# Patient Record
Sex: Female | Born: 1940 | Race: White | Hispanic: No | Smoking: Former smoker
Health system: Southern US, Community
[De-identification: ages and names within clinical notes are randomized; demographics above are authoritative.]

## PROBLEM LIST (undated history)

## (undated) DIAGNOSIS — J449 Chronic obstructive pulmonary disease, unspecified: Secondary | ICD-10-CM

## (undated) DIAGNOSIS — I2510 Atherosclerotic heart disease of native coronary artery without angina pectoris: Secondary | ICD-10-CM

## (undated) DIAGNOSIS — M1990 Unspecified osteoarthritis, unspecified site: Secondary | ICD-10-CM

## (undated) DIAGNOSIS — I739 Peripheral vascular disease, unspecified: Secondary | ICD-10-CM

## (undated) DIAGNOSIS — E119 Type 2 diabetes mellitus without complications: Secondary | ICD-10-CM

## (undated) DIAGNOSIS — F329 Major depressive disorder, single episode, unspecified: Secondary | ICD-10-CM

## (undated) DIAGNOSIS — F419 Anxiety disorder, unspecified: Secondary | ICD-10-CM

## (undated) DIAGNOSIS — I10 Essential (primary) hypertension: Secondary | ICD-10-CM

## (undated) DIAGNOSIS — K219 Gastro-esophageal reflux disease without esophagitis: Secondary | ICD-10-CM

## (undated) DIAGNOSIS — F32A Depression, unspecified: Secondary | ICD-10-CM

## (undated) DIAGNOSIS — E7800 Pure hypercholesterolemia, unspecified: Secondary | ICD-10-CM

## (undated) DIAGNOSIS — I509 Heart failure, unspecified: Secondary | ICD-10-CM

## (undated) HISTORY — PX: CORONARY ANGIOPLASTY: SHX604

## (undated) HISTORY — PX: EYE SURGERY: SHX253

## (undated) HISTORY — DX: Peripheral vascular disease, unspecified: I73.9

## (undated) HISTORY — DX: Essential (primary) hypertension: I10

## (undated) HISTORY — PX: BREAST SURGERY: SHX581

## (undated) HISTORY — DX: Heart failure, unspecified: I50.9

## (undated) HISTORY — PX: CHOLECYSTECTOMY OPEN: SUR202

## (undated) HISTORY — PX: TUBAL LIGATION: SHX77

## (undated) HISTORY — PX: DILATION AND CURETTAGE OF UTERUS: SHX78

## (undated) HISTORY — PX: BACK SURGERY: SHX140

## (undated) HISTORY — DX: Pure hypercholesterolemia, unspecified: E78.00

## (undated) HISTORY — PX: CATARACT EXTRACTION, BILATERAL: SHX1313

---

## 1999-10-05 HISTORY — PX: ANTERIOR CERVICAL DECOMP/DISCECTOMY FUSION: SHX1161

## 1999-11-24 ENCOUNTER — Encounter: Payer: Self-pay | Admitting: Emergency Medicine

## 1999-11-25 ENCOUNTER — Inpatient Hospital Stay (HOSPITAL_COMMUNITY): Admission: EM | Admit: 1999-11-25 | Discharge: 1999-11-27 | Payer: Self-pay | Admitting: Emergency Medicine

## 2000-04-03 HISTORY — PX: PERIPHERAL VASCULAR INTERVENTION: CATH118257

## 2000-04-13 ENCOUNTER — Encounter: Payer: Self-pay | Admitting: *Deleted

## 2000-04-13 ENCOUNTER — Observation Stay (HOSPITAL_COMMUNITY): Admission: RE | Admit: 2000-04-13 | Discharge: 2000-04-14 | Payer: Self-pay | Admitting: *Deleted

## 2000-10-31 ENCOUNTER — Encounter: Payer: Self-pay | Admitting: Emergency Medicine

## 2000-10-31 ENCOUNTER — Inpatient Hospital Stay (HOSPITAL_COMMUNITY): Admission: EM | Admit: 2000-10-31 | Discharge: 2000-11-04 | Payer: Self-pay

## 2000-10-31 ENCOUNTER — Encounter: Payer: Self-pay | Admitting: General Surgery

## 2000-11-01 ENCOUNTER — Encounter: Payer: Self-pay | Admitting: General Surgery

## 2000-11-25 ENCOUNTER — Ambulatory Visit (HOSPITAL_COMMUNITY): Admission: RE | Admit: 2000-11-25 | Discharge: 2000-11-25 | Payer: Self-pay

## 2000-12-06 ENCOUNTER — Encounter: Admission: RE | Admit: 2000-12-06 | Discharge: 2000-12-06 | Payer: Self-pay | Admitting: Neurosurgery

## 2001-02-03 ENCOUNTER — Ambulatory Visit (HOSPITAL_COMMUNITY): Admission: RE | Admit: 2001-02-03 | Discharge: 2001-02-03 | Payer: Self-pay | Admitting: Neurosurgery

## 2001-02-22 ENCOUNTER — Ambulatory Visit (HOSPITAL_COMMUNITY): Admission: RE | Admit: 2001-02-22 | Discharge: 2001-02-23 | Payer: Self-pay | Admitting: Neurosurgery

## 2001-03-24 ENCOUNTER — Encounter: Admission: RE | Admit: 2001-03-24 | Discharge: 2001-03-24 | Payer: Self-pay | Admitting: Neurosurgery

## 2001-06-02 ENCOUNTER — Encounter: Admission: RE | Admit: 2001-06-02 | Discharge: 2001-06-02 | Payer: Self-pay | Admitting: Neurosurgery

## 2001-06-19 ENCOUNTER — Encounter: Admission: RE | Admit: 2001-06-19 | Discharge: 2001-07-11 | Payer: Self-pay | Admitting: Neurosurgery

## 2001-06-20 ENCOUNTER — Encounter: Admission: RE | Admit: 2001-06-20 | Discharge: 2001-06-20 | Payer: Self-pay | Admitting: Neurosurgery

## 2006-03-02 ENCOUNTER — Ambulatory Visit (HOSPITAL_COMMUNITY): Admission: RE | Admit: 2006-03-02 | Discharge: 2006-03-02 | Payer: Self-pay | Admitting: Family Medicine

## 2006-03-02 ENCOUNTER — Encounter (INDEPENDENT_AMBULATORY_CARE_PROVIDER_SITE_OTHER): Payer: Self-pay | Admitting: Specialist

## 2007-03-06 ENCOUNTER — Ambulatory Visit: Payer: Self-pay | Admitting: Physician Assistant

## 2007-03-06 ENCOUNTER — Encounter: Payer: Self-pay | Admitting: Cardiology

## 2007-03-17 ENCOUNTER — Ambulatory Visit: Payer: Self-pay | Admitting: Cardiology

## 2007-03-21 ENCOUNTER — Ambulatory Visit: Payer: Self-pay | Admitting: Internal Medicine

## 2007-04-10 ENCOUNTER — Ambulatory Visit: Payer: Self-pay | Admitting: Internal Medicine

## 2007-04-10 ENCOUNTER — Ambulatory Visit (HOSPITAL_COMMUNITY): Admission: RE | Admit: 2007-04-10 | Discharge: 2007-04-10 | Payer: Self-pay | Admitting: Internal Medicine

## 2007-04-10 ENCOUNTER — Encounter: Payer: Self-pay | Admitting: Internal Medicine

## 2007-04-18 ENCOUNTER — Ambulatory Visit: Payer: Self-pay | Admitting: Cardiology

## 2007-05-01 ENCOUNTER — Ambulatory Visit: Payer: Self-pay | Admitting: Cardiology

## 2007-07-26 ENCOUNTER — Encounter: Payer: Self-pay | Admitting: Cardiology

## 2007-10-25 ENCOUNTER — Encounter: Payer: Self-pay | Admitting: Cardiology

## 2007-11-08 ENCOUNTER — Ambulatory Visit: Payer: Self-pay | Admitting: Cardiology

## 2007-11-10 ENCOUNTER — Encounter: Payer: Self-pay | Admitting: Cardiology

## 2007-11-16 ENCOUNTER — Ambulatory Visit: Payer: Self-pay | Admitting: Cardiology

## 2008-01-22 ENCOUNTER — Encounter: Payer: Self-pay | Admitting: Cardiology

## 2008-04-22 ENCOUNTER — Encounter: Payer: Self-pay | Admitting: Cardiology

## 2008-07-29 ENCOUNTER — Encounter: Payer: Self-pay | Admitting: Cardiology

## 2008-08-16 ENCOUNTER — Ambulatory Visit: Payer: Self-pay | Admitting: Cardiology

## 2008-09-05 ENCOUNTER — Ambulatory Visit (HOSPITAL_COMMUNITY): Admission: RE | Admit: 2008-09-05 | Discharge: 2008-09-05 | Payer: Self-pay | Admitting: Ophthalmology

## 2008-10-10 ENCOUNTER — Ambulatory Visit (HOSPITAL_COMMUNITY): Admission: RE | Admit: 2008-10-10 | Discharge: 2008-10-10 | Payer: Self-pay | Admitting: Ophthalmology

## 2009-05-28 ENCOUNTER — Encounter (INDEPENDENT_AMBULATORY_CARE_PROVIDER_SITE_OTHER): Payer: Self-pay | Admitting: *Deleted

## 2011-01-18 LAB — GLUCOSE, CAPILLARY: Glucose-Capillary: 115 mg/dL — ABNORMAL HIGH (ref 70–99)

## 2011-02-16 NOTE — Consult Note (Signed)
NAME:  [name], [name]              ACCOUNT NO.:  [id number]   MEDICAL RECORD NO.:  [id number]          PATIENT TYPE:  AMB   LOCATION:                                FACILITY:  [facility]   PHYSICIAN:  R. Roetta Sessions, M.D. DATE OF BIRTH:  [birth year]   DATE OF CONSULTATION:  03/21/2007  DATE OF DISCHARGE:                                 CONSULTATION   REASON FOR CONSULTATION:  Chest pain, need for colonoscopy.   HISTORY OF PRESENT ILLNESS:  Ms. [name] [name] is a pleasant 70-year-  old Caucasian female, insulin-dependent diabetic; sent over by the  courtesy of Dr. Konrad Penta for consideration of colonoscopy  and evaluation of chest pain.  [name] describes reflux symptoms.  She describes heartburn on a regular basis.  She takes Prevacid on a  p.r.n. basis with good relief.  She tells me she has retrosternal chest  pain when she exerts herself; for instance, walking a couple of blocks.  She also tells me that Prevacid relieved this pain, but rest after  exertion also relieves this pain.  She has not had any unusual  diaphoresis or shortness of breath.  She tells me that she went to  [facility] and had a stress test just last week, and she is  unaware of the results at this time.   She denies odynophagia, dysphagia, early satiety, nausea or vomiting.  She does have intermittent low-volume hematochezia.  She has never had  her lower GI tract evaluated.  There is no family history of colorectal  neoplasia.  There is a positive family history heart disease in both her  mother and father.   PAST MEDICAL HISTORY:  1. Insulin-dependent diabetes mellitus.  2. Hypertension.   PAST SURGERIES:  1. Neck surgery.  2. Breast surgery for benign disease.  3. Status post tubal ligation.   CURRENT MEDICATIONS:  1. __________ 3-5 units before meals.  2. Lantus 75 units.  3. __________  5 mg daily.  4. Azor 10/40 daily.  5. Tricor 145 mg daily.  6. Lasix 40 mg daily.  7.  Calcium 600.  8. Vitamin D daily.  9. Prevacid 30 mg p.r.n.   ALLERGIES:  MACROBID, SOME IV MEDICATION, SOME COUGH SYRUP (Type  Unknown).   FAMILY HISTORY:  Mother died at age 54 with an MI. Her father died at  age 76 with congestive heart failure; otherwise no history of chronic GI  or liver illness.   SOCIAL HISTORY:  The patient is widowed.  She is retired.  No tobacco,  no alcohol.  No illicit drugs.   REVIEW OF SYSTEMS:  Chest pain on exertion, as outlined above.  Reflux  symptoms frequently, as outlined above.  No change in weight.  No fever  or chills.  Otherwise as in history of present illness.   PHYSICAL EXAMINATION:  GENERAL:  Reveals a pleasant 70 year old  Caucasian female accompanied by her grandson.  Weight 190.5, height 5  feet 6 inches, temperature 98.5, BP 160/72, pulse 60.  SKIN:  Warm and dry.  There was no jaundice.  No stigmata of chronic  liver disease.  HEENT:  No scleral icterus.  Conjunctivae are pink.  Oral cavity with no  lesions.  CHEST:  Lungs clear to auscultation.  CARDIAC:  Regular rate and rhythm without murmur, gallop or rub.  BREAST EXAMINATION:  Deferred.  ABDOMEN:  Obese, positive bowel sounds.  Soft and nontender without  appreciable mass or organomegaly.  EXTREMITIES: No edema.  RECTAL:  Exam deferred at time of colonoscopy.   IMPRESSION:  Ms. [name] [name] is a pleasant 70 year old lady with  frequent typical gastroesophageal reflux disease symptoms.  She also has  somewhat atypical chest pain without radiation when she exerts herself.  This symptom stands out as not being related to gastroesophageal reflux  disease, although she does tell me she gets relief with Prevacid; but  she takes Prevacid after she is at rest following the episode of  exertion.  She has intermittent low-volume hematochezia.  She has never  had her lower GI tract evaluated.   She had a stress test just last week; the results of this study are  unknown to me  at this time.  Chest pain on exertion in this setting is  angina, until proven otherwise.  She does need an EGD and colonoscopy.  We will review the stress test when it becomes available.  Further  cardiac evaluations warranted, but will hold off on gastric test until  that has been done.  If her stress test however is normal, we will  proceed with a diagnostic EGD and colonoscopy.  I have asked her in the  interim to go ahead and start taking her Prevacid 30 mg every day 30  minutes before breakfast, and have given her literature on  gastroesophageal reflux disease.   Further recommendations are to follow.      Jonathon Bellows, M.D.  Electronically Signed     RMR/MEDQ  D:  03/21/2007  T:  03/22/2007  Job:  [id number]

## 2011-02-16 NOTE — Assessment & Plan Note (Signed)
[facility]                          [name]   NAME:[name] [name], [name] [name]                     MRN:          [id number]  DATE:11/08/2007                            DOB:          March 04, 1941    REFERRING PHYSICIAN:  Dr. Samuel Jester.   HISTORY OF PRESENT ILLNESS:  The patient is a 70 year old female with  history of coronary artery disease status post angioplasty at the distal  LAD in 2001.  The patient more recently had a cardiac study which was  negative for ischemia.  The patient has been doing well.  She does  report occasional dizzy spells, particularly when her blood sugar is  low.  She does not have any chest pain, orthopnea or PND.  She has no  palpitations or syncope. The patient has been reportedly scheduled for a  sleep study by Dr. Charm Barges.  She occasionally reports reflux but this is  improved with Prevacid.   MEDICATIONS:  Lantus insulin, calcium, enteric coated aspirin, Lasix 40  mg daily, Prevacid, Triatex 125 mg p.o. daily, oxygen 2 liters at night,  Crestor 10 mg daily, Suple, hydrochlorothiazide 2.5/6.5 mg p.o. daily,  amlodipine 5 mg p.o. daily, and Meclozine p.r.n.   PHYSICAL EXAMINATION:  VITAL SIGNS:  Blood pressure is 153/82, heart  rate is 58 beats per minute. Weight is 191 pounds.  NECK EXAM:  Normal carotid upstroke and no carotid bruits.  LUNGS:  Clear breath sounds bilaterally.  HEART:  Regular rate and rhythm with normal S1, S2.  ABDOMEN:  Soft, nontender, no rebound, no guarding. Good bowel sounds.  EXTREMITIES:  No clubbing, cyanosis or edema.  NEURO:  Patient is alert and oriented and grossly nonfocal.   PROBLEM LIST:  1. Atypical chest pain, resolved.  2. GERD.  3. Remote history of myocardial infarction.      a.     Status post angioplasty in 2001.      b.     Preserved LV function.      c.     Negative Cardiolite stress study in June of 2008.  4. Peripheral vascular disease.      a.     Status  post left iliac stenting, July, 2001.      b.     Arterial brachial indices within normal limits bilaterally.  5. Diabetes mellitus.  6. Hypertension.  7. Dyslipidemia.  8. Rule out sleep apnea.   PLAN:  1. The patient has been scheduled for a sleep study.  2. I have added lisinopril 10 mg p.o. daily for better blood pressure      control, particularly in view of the patient's diabetes. A BMET      will be done in a week.  3. EKG was reviewed and the patient demonstrates sinus bradycardia      with no acute ischemic changes.  4. The patient will follow up with [place] in 6 months.     Learta Codding, MD,FACC  Electronically Signed    GED/MedQ  DD: 11/10/2007  DT: 11/12/2007  Job #: [id number]

## 2011-02-16 NOTE — Assessment & Plan Note (Signed)
[name]                          [name]   NAME:[name], [name]                     MRN:          [id number]  DATE:03/06/2007                            DOB:          Jun 21, 1941    CARDIOLOGIST:  She will be new to Dr. Andee Lineman.  She notes that she  previously had been seen by Dr. Corinda Gubler.   PRIMARY CARE PHYSICIAN:  Dr. Samuel Jester   CHIEF COMPLAINT:  Chest pain.   HISTORY OF PRESENT ILLNESS:  [name] [name] is a very pleasant 70 year old  female patient who has a history of coronary artery disease, who is  referred for reestablishment and further evaluation of chest pain.  She  had previously been seen apparently by Dr. Corinda Gubler in [place].  She  notes a history of myocardial infarction in 1997, treated with  angioplasty.  I do not have records from that time.  However, I do have  records from 2001 where she had cardiac catheterization secondary to an  abnormal stress test.  She had angioplasty of a distal LAD lesion  secondary to this.  She also had noted peripheral arterial disease and  underwent left iliac stenting in July 2001.  She had been lost to  followup over the years.  [facility]  been placed on Lasix and potassium.  She recently established  with Dr. Charm Barges some months ago.  The patient notes a history of chest  discomfort dating back to December 2007.  This seemed to be mainly with  exertion and relieved by rest.  She also noted that she did get chest  discomfort at rest.  It seemed to occur around the time she took her  Lasix and potassium.  She could take Prevacid with relief.  She had  associated diaphoresis but no nausea.  She did not shortness of breath  and lightheadedness but no syncope.  When she saw Dr. Charm Barges about 3  weeks ago, her Lasix and potassium were both discontinued.  The patient  has not had any further symptoms since that time.  She is able to  exert  herself without chest pain or shortness of breath.  She denies a history  of orthopnea or paroxysmal nocturnal dyspnea.  She does have chronic  lower extremity edema, and there has been no change.   PAST MEDICAL HISTORY:  1. As noted above, she does have a history of myocardial infarction in      1997 treated with angioplasty and stenting.  I do not have records      of that.  She does have a history of angioplasty to the distal LAD      in February 2001.  Cardiac catheterization at that time revealed an      EF of 65%.  Left main was normal.  LAD 25% proximal, 50% distal,      and 80% further down in the distal LAD that was treated with      angioplasty.  Left circumflex with 20% mid, 25% OM 2, 20% OM 3, RCA  40% mid to distal, large PDA 25% followed by 40%.  2. Peripheral arterial disease status post left iliac stenting July      2001.  3. Diabetes mellitus.  4. Hyperlipidemia.  5. Hypertension.  6. Gastroesophageal reflux disease.  7. Status post neck surgery.  8. Status post bilateral tubal ligation.  9. History of ear surgery.   CURRENT MEDICATIONS:  1. Azor 10/40 mg daily.  2. Lantus 75 units daily.  3. Apidra 5 units q.a.c.  4. Tricor 145 mg daily.  5. Calcium 600 mg plus vitamin D twice daily.  6. Allegra 180 mg daily.  7. Prevacid p.r.n.  8. Nitroglycerin p.r.n.   ALLERGIES:  MACROBID AND HYDRO SYRUP.   SOCIAL HISTORY:  The patient is an ex-smoker.  She quit in 2001.  She  smoked a quarter pack per day for 30 years, denies alcohol or drug  abuse.  She is retired from [employer].  She is widowed and has 2 children.   FAMILY HISTORY:  Significant for coronary disease in her mother who had  severe myocardial infarctions, the first of which was at age 22.  She  also had bypass and died in her 13s.   REVIEW OF SYSTEMS:  Please see HPI.  She has recently been scheduled for  an endoscopy and colonoscopy in [place], June 18.  She also notes  right lower  extremity pain with exertion that goes away with rest.  She  denies any fevers, chills, cough, melena, hematuria, dysuria.  The rest  of the review of systems are negative.   PHYSICAL EXAMINATION:  GENERAL:  She is a well-nourished, well-developed  female.  VITAL SIGNS:  Blood pressure 152/79, pulse 67, weight 190.8 pounds.  HEENT:  Normal.  NECK:  Without JVD.  ENDOCRINE:  Without thyromegaly.  CAROTIDS:  Without bruits bilaterally.  CARDIAC:  S1, S2, regular rate and rhythm with short 1/6 systolic  ejection murmur heard best at the right upper sternal border.  LUNGS:  Clear to auscultation bilaterally without wheeze, rhonchi, or  rales.  ABDOMEN:  Soft, nontender with good bowel sounds, no organomegaly.  EXTREMITIES:  Without edema.  Calves soft and nontender.  SKIN:  Warm and dry.  NEUROLOGIC:  She is alert and oriented x3.  Cranial nerves 2-12 grossly  intact.   Electrocardiogram reveals sinus rhythm with a heart rate of 65, normal  axis, T-wave flattening, no acute changes.  No old tracing to compare.   IMPRESSION:  1. Chest pain with typical and atypical features.  2. Coronary artery disease.      a.     Remote history of myocardial infarction, 1997.      b.     Status post angioplasty to the distal left anterior       descending in 2001 with residual coronary artery disease as noted       above.  3. Preserved left ventricular function, 55% by catheterization 2001.  4. Peripheral arterial disease.      a.     Status post left iliac stenting, July 2001.  5. Diabetes mellitus.  6. Hyperlipidemia.  7. Hypertension.  8. Gastroesophageal reflux disease.  9. Right lower extremity pain.      a.     Rule out claudication.  10.Murmur.   PLAN:  The patient presents to the office today for establishment and  further evaluation of her chest pain.  She does have a history of  coronary artery disease.  She had some typical  and atypical features to her pain.  It is somewhat  atypical, but all of her symptoms abated once  her Lasix and potassium were stopped.  Some of her symptoms are also  relieved with Prevacid.  I think at this point in time, we will proceed  with a stress Cardiolite study to rule out the possibility of ischemia.  We will also get an echocardiogram to further assess her murmur and left  ventricular function.  She also has some leg pain with exertion.  We  will set her up with ankle brachial indices and lower extremity arterial  Dopplers to follow up on her peripheral arterial disease.  I will try to  obtain results of her recent lipid panel.  If that has not been done, we  will set her up for lipids and LFTs.  Given her coronary disease, I  suspect she would probably benefit more from a statin than Tricor, but  we will await the results of her lipid panel.  I will have her follow up  with Dr. Andee Lineman in 4 weeks' time.      Tereso Newcomer, PA-C  Electronically Signed      Learta Codding, MD,FACC  Electronically Signed   SW/MedQ  DD: 03/06/2007  DT: 03/06/2007  Job #: [id number]   cc:   Samuel Jester

## 2011-02-16 NOTE — Op Note (Signed)
NAME:  [name], [name]              ACCOUNT NO.:  [id number]   MEDICAL RECORD NO.:  [id number]          PATIENT TYPE:  AMB   LOCATION:  DAY                           FACILITY:  [facility]   PHYSICIAN:  R. Roetta Sessions, M.D. DATE OF BIRTH:  1941-09-11   DATE OF PROCEDURE:  04/10/2007  DATE OF DISCHARGE:                               OPERATIVE REPORT   PROCEDURE:  Diagnostic EGD followed by colonoscopy with biopsy.   INDICATIONS FOR PROCEDURE:  The patient is a 70 year old lady with  atypical chest pain.  She does have an exertional component to chest  pain.  She has seen Dr. Domingo Sep.  She has had noninvasive stress  testing which she tells me she failed and was told she has angina and  was started on medical therapy.  Her exertional chest pain has improved  and she is really not having much in the way of any typical reflux  symptoms.  She has been taking Prevacid on a p.r.n. basis.  She also has  intermittent low-volume hematochezia.  EGD and colonoscopy now being  done.  This approach has been discussed with the patient at length.  Potential risks, benefits and alternatives have been reviewed, questions  answered and she is agreeable.  Please see documentation on the medical  record.   PROCEDURE NOTE:  O2 saturation, blood pressure, pulse and respirations  were monitored throughout the entirety of both procedures.   CONSCIOUS SEDATION:  Versed 7 mg IV, Demerol 101 grams IV in divided  doses.   INSTRUMENTATION:  Pentax video chip system.   FINDINGS:  EGD:  Examination of tubular esophagus revealed distal  esophageal erosions, the longest was 2 cm in length.  They are  circumferential.  There was no Barrett's esophagus and no other  esophageal mucosal abnormalities were seen.  EG junction easily  traversed.   Stomach:  Gastric cavity was empty and insufflated well with air.  Thorough examination of the gastric mucosa including retroflexed view of  the proximal stomach and  esophagogastric junction demonstrated only a  small hiatal hernia.  Pylorus patent, easily traversed.  Examination of  bulb and second portion revealed no abnormalities.   THERAPEUTIC/DIAGNOSTIC MANEUVERS PERFORMED:  None.   The patient tolerated procedure well and was prepared for colonoscopy.  Digital rectal exam revealed no abnormalities.   ENDOSCOPIC FINDINGS:  The prep was adequate.   Colon:  Colonic mucosa was surveyed from the rectosigmoid junction  through the left, transverse and right colon to the area of appendiceal  orifice, ileocecal valve and cecum.  These structures well seen and  photographed for the record.  From this level the scope was slowly  withdrawn.  All previously mentioned mucosal surfaces were again seen.  The patient was noted to have shallow left-sided diverticula.  There was  one diminutive polyp in the mid descending colon and a second one in the  mid sigmoid which were cold biopsy/removed.  Remainder of colonic mucosa  appeared normal.  Scope was pulled down in the rectum where thorough  examination of the rectal mucosa including retroflexed view of the anal  verge demonstrated friable anal canal and anal papilla and hemorrhoids  were also present.  The patient tolerated both procedures well as  reactive to endoscopy.   IMPRESSION:  1. EGD:  Distal esophageal erosions consistent with erosive reflux      esophagitis, otherwise normal esophagus.  Small hiatal hernia,      otherwise normal stomach, D1 and D2.  2. Colonoscopy findings friable anal canal, hemorrhoids, anal papilla,      otherwise normal rectum with shallow left-sided diverticula,      diminutive left colon polyps removed as described above, otherwise      colonic mucosa appeared normal.   RECOMMENDATIONS:  Continue Prevacid 30 mg orally daily.  Antireflux  literature hemorrhoid literature provided to [name] [name].  A 10-day  course of Anusol HC suppositories one per rectum at bedtime  daily.  Fiber supplement with Metamucil or Citrucel.  Follow-up on path.  No  further recommendations to follow.      Jonathon Bellows, M.D.  Electronically Signed     RMR/MEDQ  D:  04/10/2007  T:  04/10/2007  Job:  [id number]   cc:   Learta Codding, MD,FACC  [address]  [place], [place] [address]   Samuel Jester  Fax: [phone]

## 2011-02-16 NOTE — Assessment & Plan Note (Signed)
[facility]                          [name]   NAME:[name] [name], [name] [name]                     MRN:          [id number]  DATE:04/18/2007                            DOB:          Nov 13, 1940    REFERRING PHYSICIAN:  Samuel Jester   HISTORY OF PRESENT ILLNESS:  The patient is a 70 year old female with  history of coronary artery disease  previously seen by Tereso Newcomer in  our office.  The patient has a history of PCI to a distal LAD in 2001.  She was referred for a Cardiolite stress study which was within normal  limits with no ischemia.  She had no ejection fraction and also her  echocardiographic study was normal.  ABI's done to rule out peripheral  arterial disease were also within normal limits.  The patient stated  that with full dose Bystolic she felt dizzy and weak and cut this in  half.  She is now doing much better.  She reports no chest pain,  orthopnea, PND, palpitations or syncope.   MEDICATIONS:  1. Azor 10 mg/40 mg p.o. daily.  2. Lantus 75 p.o. q.a.m.  3. Calcium.  4. Allegra.  5. Enteric coated aspirin.  6. Bystolic 5 mg, one half tablet p.o. daily.  7. Lasix 40 mg daily.  8. Zocor 40 mg p.o. q.h.s.  9. Prevacid daily.   PHYSICAL EXAMINATION:  VITAL SIGNS:  Blood pressure 144/72, heart rate  60, weight 192 pounds.  NECK:  Normal carotid upstroke, no carotid bruits.  LUNGS:  Clear breath sounds bilaterally.  HEART:  Regular rate and rhythm with normal S1 and S2.  No murmurs, rubs  or gallops.  ABDOMEN:  Soft.  EXTREMITIES:  No cyanosis, clubbing or edema.  NEUROLOGICAL:  Patient is alert and oriented.  Grossly nonfocal.   PROBLEM LIST:  1. Atypical chest pain, resolved.  2. Coronary artery disease.      a.     Remote history of myocardial infarction September, 1997.      b.     Status post angioplasty distal left anterior descending       2001.      c.     Preserved left ventricular function.      d.      Negative for Cardiolite imaging study recently.  3. Peripheral arterial disease.      a.     Status post left iliac stenting July 2001.      b.     Ankle brachial indices within normal limits bilaterally.  4. Diabetes mellitus.  5. Hypertension.  6. Dyslipidemia.   PLAN:  The patient is concerned regarding the cost of Bystolic.  She is  almost running out of her samples.  She is requesting a cheaper beta  blocker.  I have given her a prescription for bisoprolol 2.5 mg p.o.  daily.  The patient was notified about all of her test results. In  essence, they are within normal limits and the patient can continue with  risk factor modification, __________ control.  Follow up with [place] in one  year.  Learta Codding, MD,FACC  Electronically Signed    GED/MedQ  DD: 04/18/2007  DT: 04/19/2007  Job #: [id number]   cc:   Samuel Jester
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
## 2011-02-19 NOTE — Cardiovascular Report (Signed)
. [facility]  Patient:    [name] [name], [name] [name]                     MRN: [id number] Proc. Date: 11/26/99 Adm. Date:  [id number] Disc. Date: [id number] Attending:  Talitha Givens CC:         [facility]             Luis Abed, M.D. [facility]             Cardiac Catheterization Laboratory                        Cardiac Catheterization  PROCEDURES PERFORMED: 1. Left heart catheterization with coronary angiography, left ventriculography, and    abdominal aortography. 2. Percutaneous transluminal coronary angioplasty of the distal left anterior    descending artery.  INDICATIONS:  Ms. [name] [name] is a 70 year old woman with diabetes and a history of tobacco abuse.  She was admitted with unstable angina.  EKG showed development f mild T-wave inversions in leads V4 through V6.  A Cardiolite scan was suggestive of an apical defect.  She was thus referred for catheterization.  CATHETERIZATION PROCEDURAL NOTE:  A 6-French sheath was placed in the right femoral artery.  Standard Judkins 6-French catheters were utilized.  Contrast was Omnipaque.  There were no complications.  CATHETERIZATION RESULTS:  HEMODYNAMIC DATA:  Aortic pressure was initially extremely high at 215/92.  She was given intravenous enalaprilat, intravenous labetalol, and intravenous nitroglycerin, ultimately bringing her systolic blood pressure down to less than 160.  Left ventricular pressure was 180/17, with a corresponding aortic pressure of 180/80.  There was no aortic valve gradient.  LEFT VENTRICULOGRAM:  Wall motion was normal.  Ejection fraction was estimated t 65%.  No mitral regurgitation.  ABDOMINAL AORTOGRAM:  This revealed patent renal arteries and abdominal aorta. The left common iliac artery had a 75% stenosis.  The right external iliac artery had a 40% stenosis.  CORONARY ARTERIOGRAPHY (Right dominant): 1. The left main was normal.  2. The  left anterior descending artery had a 25% stenosis in the proximal to mid    vessel.  The distal LAD had a 50% stenosis just after the bifurcation of the  large second diagonal branch.  Further down the distal LAD is a focal 80%    stenosis.  3. The left circumflex had a 20% stenosis in the mid vessel.  It gave rise to a    small OM-1, a normal sized OM-2 which had a 25% stenosis, and a normal sized  OM-3 which had a 20% stenosis.  4. The right coronary artery had a diffuse 40% stenosis extending from the mid o    distal vessel.  There was a large posterior descending artery which had a 25%,    followed by a 40% stenosis.  There were two small posterolateral branches.  IMPRESSIONS: 1. Normal left ventricular systolic function. 2. Peripheral vascular disease, as described. 3. One-vessel coronary artery disease with significant stenosis in the distal left    anterior descending artery.  This appears to correspond with her EKG changes and    Cardiolite scan.  She has moderate, but non-flow limiting disease in the    right coronary and left circumflex.  PLAN:  Percutaneous intervention of the distal LAD, see below.  PTCA PROCEDURAL NOTE:  Following the completion of the diagnostic catheterization, we opted to proceed with percutaneous intervention.  The preexisting  6-French sheath in the right femoral artery was exchanged over a wire for a 7-French sheath. Integrilin and heparin were administered per protocol.  We used a 7-French JL-4  guiding catheter and a BMW wire.  The reference vessel was approximately 2 mm in diameter.  We utilized a 2.0 x 20 mm CrossSail balloon, which was inflated initially to 12 atm x 2 minutes and then 14 atm x 3 minutes.  Final angiographic images revealed significant improvement in the lumen with 25% residual stenosis and a possible very small non-flow limiting dissection.  There was TIMI-3 flow into the distal vessel.  COMPLICATIONS:   None.  RESULTS:  Successful percutaneous transluminal coronary angioplasty of the distal left anterior descending artery, reducing an 80% stenosis to 25% residual with TIMI-3 flow.  PLAN:  Integrilin will be continued for 20 hours.  The patient needs aggressive  blood pressure control and risk factor reduction.  In regards to her peripheral vascular disease, she will be referred for further  evaluation and possible intervention. DD:  11/26/99 TD:  11/27/99 Job: [id number]
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
## 2011-02-19 NOTE — Op Note (Signed)
[facility]  Patient:    [name] [name], [name] [name]                     MRN: [id number] Proc. Date: 04/13/00 Adm. Date:  [id number] Attending:  Veneda Melter CC:         Veneda Melter, M.D. [facility]             Luis Abed, M.D. [facility]             Dr. Zenda Alpers, [place] [facility]                           Operative Report  PROCEDURES PERFORMED: 1. Abdominal aortogram. 2. Bilateral lower extremity angiogram. 3. Percutaneous transluminal angioplasty and stenting of the left iliac    artery.  SURGEON:  Veneda Melter, M.D.  DIAGNOSES: 1. Peripheral vascular disease. 2. Claudication.  HISTORY:  Ms. [name] [name] is a 70 year old white female with diabetes mellitus, coronary artery disease and exertional claudication, who has had worsening symptoms.  Recently, she was admitted to [facility] with chest pain and underwent percutaneous intervention to the LAD.  At that time, she was found to have high-grade narrowing of 70% in the left common iliac artery.  Ankle brachial indices on April 07, 2000 were 0.99 on the right and 0.89 on the left. Despite conservative treatment, she has had progressive symptoms and she presents now for lower extremity angiogram and possible intervention of left iliac artery.  TECHNIQUE:  After informed consent was obtained, patient was brought to the catheterization lab at [facility].  Both groins were sterilely prepped and draped.  Lidocaine 1% was used to infiltrate the left groin and a 6-French sheath placed in the left femoral artery using modified Seldinger technique.  A 5-French pigtail catheter was advanced into the descending aorta and abdominal aortogram performed to assess the iliac bifurcation.  This was then removed over a Wholey wire and a 5-French short IMA diagnostic catheter introduced; this was used to engage the iliac bifurcation and the Wholey wire placed in the right iliac artery.  An end-hole catheter was  then exchanged from the IMA catheter and a right lower extremity angiogram performed using power injections of contrast through the end-hole catheter.  End-hole catheter was removed and the left lower extremity angiogram performed using power injections of contrast through the left femoral sheath.  FINDINGS:  Initial findings are as follows: 1. Abdominal aorta is of normal caliber above the level of the iliac    bifurcation with only mild atheromatous buildup. 2. Right lower extremity:  The right iliac artery is a large-caliber vessel    with mild irregularities.  There is a focal narrowing of approximately    30-40% in the right external iliac artery just distal to the bifurcation.    The right common femoral artery has a focal narrowing of approximately 30%;    there is also evidence of a retrograde dissection on the lateral wall which    is not flow limiting.  The superficial femoral artery has mild    irregularities, as does the popliteal artery.  There is three-vessel runoff    to the ankle.  The posterior tibial artery is the dominant vessel. 3. Left lower extremity:  The left iliac artery has a moderate amount of    stenosis at the proximal segment.  The midsection of the left common iliac    artery has a high-grade  narrowing of 70%.  There is a further narrowing of    70% at the bifurcation of the external and internal iliacs.  Mild diffuse    disease is noted in the left common femoral artery with narrowings of 30%.    The superficial femoral artery is a normal-caliber vessel.  There is a long    diseased segment in the midsection with focal narrowing of 70%.  Popliteal    artery has mild irregularities.  The trifurcation vessels are patent with    three-vessel runoff to the ankle.  The posterior tibial artery is a    dominant vessel.  The end-hole catheter was then reintroduced over the [facility] wire and a pullback gradient performed across the left iliac stenoses.  This showed  a pressure gradient of greater than 35 mmHg.  We elected to proceed with percutaneous intervention of the left iliac artery.  The 6-French sheath was exchanged over a wire for a long 7-French sheath and selective guide shots obtained using manual injections of contrast.  Heparin was administered to maintain ACT of approximately 250 seconds and Wholey wire placed across the stenoses.  An 8 x 4 Power-Flex balloon was introduced and two inflations performed at 6 atmospheres for 60 seconds across both stenoses until resolution of the waist was noted.  Repeat angiography showed improvement in vessel lumen and reduction of the stenosis; however, there was evidence of extensive dissection in the common and external iliac arteries.  A 10 x 6 Smart stent was introduced and carefully positioned under cineangiography in the proximal segment of the left iliac artery, extending into the external artery.  This was then deployed.  The 8 x 4 Power-Flex balloon was then used to post-dilate the stent.  Two inflations were performed at 10 atmospheres for 30 seconds.  Repeat angiography showed an excellent result with no residual stenosis, appropriate coverage of the dissection and no impedance to flow. The end-hole catheter was reintroduced and repeat gradient performed, showing less than 5 mmHg residual gradient; this was deemed acceptable.  Final angiography was performed in various projections showing no distal vessel damage and excellent flow through the left iliac artery.  The sheath was secured into position and patient transferred to the holding area where the sheath was removed when the ACT returned to normal.  The patient tolerated the procedure well and was transported to the floor in stable condition.  FINAL RESULTS:  Successful PTA and stenting of the left iliac artery with reduction of sequential 70% narrowings to 0% with placement of a 10 x 6 Smart stent, dilated to 8 mm.  ASSESSMENT AND  PLAN:  Ms. [name] [name] is a 70 year old white female with peripheral vascular disease and claudication.  She has undergone intervention to  high-grade sequential narrowings in the left iliac and external iliac arteries.  She has residual disease in the left superficial femoral artery. At this point, the patient will be followed clinically.  If she has continued symptoms of claudication, consideration may be given towards percutaneous intervention of the left superficial femoral artery. DD:  04/13/00 TD:  04/13/00 Job: [id number]
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
## 2011-02-19 NOTE — Discharge Summary (Signed)
[facility]  Patient:    [name] [name], [name] [name]                     MRN: [id number] Adm. Date:  [id number] Disc. Date: 04/14/00 Attending:  Veneda Melter Dictator:   Tereso Newcomer, P.A.-C. CC:         Veneda Melter, M.D. [facility]             Dr. Zenda Alpers                           Discharge Summary  DATE OF BIRTH:  1941-05-15.  PROCEDURES PERFORMED THIS ADMISSION:  Bilateral lower extremity arteriogram via left femoral artery.  Right side showing mild disease, three-vessel runoff.  Left side showing 70% left common iliac mid, 70% at bifurcation, 70% mid SFA, three-vessel runoff.  PTA/stent to the left iliac with 0% residual.  DISCHARGE DIAGNOSES: 1. Peripheral vascular disease. 2. Diabetes mellitus. 3. Hypertension. 4. Coronary artery disease. 5. History of tobacco use.  INDICATIONS FOR PROCEDURE:  This 70 year old female with the above noted history has a long history of diabetes and peripheral neuropathy with numbness and tingling of both feet.  However, over the past few years, she has had progressive left lower extremity claudication described as aching pain in the thighs and calves with ambulation.  This has progressed to the point where she noted profound weakness with minimal activity.  She had staggered and fallen on several occasions.  She had recently had a cardiac catheterization in February 2000.  Distal angiogram at that time showed high-grade narrowing of at least 75% left common iliac artery.  On April 12, 2000 she was seen in the office for discussion of her symptoms and possible interventions.  The patient noted at that time an increase in her symptoms of her diabetic neuropathy and almost continuous burning and tingling, as well as numbness in both feet to the point that she is unable to perform her activities at work where she does quite a bit of lifting and moving of boxes.  She had noted some mild hyperpigmentation in her lower  extremities.  She denies any edema, syncope, near syncope.   She denies any chest pain or shortness of breath, no orthopnea and no PND.  The patient agreed at that time to undergo lower extremity angiogram with possible percutaneous intervention of the left iliac artery.  HOSPITAL COURSE:  On April 13, 2000, the patient entered the hospital for the above noted procedure.   She had no immediate complications.  On the morning of the April 14, 2000, she was stable.  Her groin showed no signs of pseudoaneurysm.  Given her blood pressure during admission, it was felt Norvasc could be added to her regimen, but she declined this because she reported a side effect to it in the past.  The patient also thought Plavix was making her feel dizzy.  The importance of taking the Plavix for the next one month was explained to her.  This explanation included the risks of restenosis, lower extremity ischemia, and possibly of loss of limb.  She understood this and agreed to take the prescription for the Plavix at the time of discharge.  It was felt she was stable enough for discharge to home on the afternoon of April 14, 2000.  DISCHARGE MEDICATIONS: 1. Plavix 75 mg q.d. 2. Paxil 75 mg q.d. x 28 days. 3. Coated aspirin 325 mg q.d. 4.  Insulin 70/30 40 units q. a.m.; 20 units q. p.m. 5. Diovan 160 mg p.o. q.d. 6. Actos 50 mg p.o. q.d. 7. Atenolol 100 mg p.o. q.d. 8. Neurontin.  DIET:  The patient is to maintain a low-fat, low-salt diabetic diet.  INSTRUCTIONS:   She is to do no driving or heavy exertion for three days.  She has been advised to call the office with any problems or questions.  If she has any problems with her groin, she should call the office.  Dr. Myrtis Ser saw her on the day of discharge and gave a note for return to work on Monday, April 25, 2000.  She is to take the Plavix for the next month.  At follow-up her symptoms will be assessed clinically.  If she has continued claudication then  consideration of percutaneous intervention to the left superficial femoral artery will be done at that time.  FOLLOW-UP:  Follow-up appointment with Dr. Chales Abrahams in one month in the [facility] office on June 01, 2000 at 10:15 in the morning. DD:  04/14/00 TD:  04/14/00 Job: [id number]

## 2011-07-06 LAB — BASIC METABOLIC PANEL
Creatinine, Ser: 0.88 mg/dL (ref 0.4–1.2)
GFR calc non Af Amer: >60 mL/min (ref >60)
Glucose, Bld: 329 mg/dL — ABNORMAL HIGH (ref 70–99)
Potassium: 4 mEq/L (ref 3.5–5.1)

## 2011-07-06 LAB — HEMOGLOBIN AND HEMATOCRIT, BLOOD: HCT: 41.9 % (ref 36.0–46.0)

## 2011-07-09 LAB — GLUCOSE, CAPILLARY

## 2011-08-05 ENCOUNTER — Encounter (HOSPITAL_COMMUNITY): Payer: Self-pay | Admitting: Anesthesiology

## 2011-12-21 ENCOUNTER — Other Ambulatory Visit: Payer: Self-pay | Admitting: *Deleted

## 2012-02-29 ENCOUNTER — Encounter: Payer: Self-pay | Admitting: *Deleted

## 2012-03-01 ENCOUNTER — Encounter: Payer: Self-pay | Admitting: *Deleted

## 2012-03-01 ENCOUNTER — Encounter: Payer: Self-pay | Admitting: Cardiology

## 2012-03-01 ENCOUNTER — Ambulatory Visit (INDEPENDENT_AMBULATORY_CARE_PROVIDER_SITE_OTHER): Payer: Medicare Other | Admitting: Cardiology

## 2012-03-01 ENCOUNTER — Telehealth: Payer: Self-pay | Admitting: Cardiology

## 2012-03-01 ENCOUNTER — Other Ambulatory Visit: Payer: Self-pay | Admitting: Cardiology

## 2012-03-01 DIAGNOSIS — E7800 Pure hypercholesterolemia, unspecified: Secondary | ICD-10-CM | POA: Insufficient documentation

## 2012-03-01 DIAGNOSIS — R079 Chest pain, unspecified: Secondary | ICD-10-CM | POA: Insufficient documentation

## 2012-03-01 DIAGNOSIS — I25810 Atherosclerosis of coronary artery bypass graft(s) without angina pectoris: Secondary | ICD-10-CM

## 2012-03-01 DIAGNOSIS — I2510 Atherosclerotic heart disease of native coronary artery without angina pectoris: Secondary | ICD-10-CM | POA: Insufficient documentation

## 2012-03-01 DIAGNOSIS — I10 Essential (primary) hypertension: Secondary | ICD-10-CM | POA: Insufficient documentation

## 2012-03-01 DIAGNOSIS — I739 Peripheral vascular disease, unspecified: Secondary | ICD-10-CM | POA: Insufficient documentation

## 2012-03-01 DIAGNOSIS — E119 Type 2 diabetes mellitus without complications: Secondary | ICD-10-CM | POA: Insufficient documentation

## 2012-03-01 MED ORDER — AMLODIPINE BESYLATE 10 MG PO TABS: 10.0000 mg | ORAL_TABLET | Freq: Every day | ORAL | Status: DC

## 2012-03-01 MED ORDER — LISINOPRIL 40 MG PO TABS: 40.0000 mg | ORAL_TABLET | Freq: Every day | ORAL | Status: DC

## 2012-03-01 NOTE — Assessment & Plan Note (Signed)
No evidence of peripheral vascular disease. Normal distal pulses bilaterally the patient does complain of weakness in the legs on exertion. I don't think the patient needs arterial Dopplers at this point.

## 2012-03-01 NOTE — Assessment & Plan Note (Signed)
The patient has atypical chest pain. She reports sharp twinges in the left shoulder and left chest. Nevertheless given her multiple risk factor profile we will proceed with this on echocardiogram in conjunction with dobutamine stress echocardiogram.

## 2012-03-01 NOTE — Progress Notes (Signed)
Peyton Bottoms, MD, [facility] ABIM Board Certified in Adult Cardiovascular Medicine,Internal Medicine and Critical Care Medicine    CC: Evaluation of patient with chest pain and known coronary artery disease.  HPI:  The patient is 71 year old female with a history of peripheral vascular disease and coronary artery disease, details as outlined below. She did have a Cardiolite study done in 2008 which was negative for ischemia. She had balloon angioplasty done of the distal LAD and has a left iliac stent. This was done in 2001. The patient presents for followup. She has not been seen in quite some time. She reports symptoms of substernal chest pain which are left-sided, needlelike feeling also radiating to the left arm. She reports no definite substernal heaviness during activity. However this she does report shortness of breath when walking up stairs. She denies any presyncope or syncope. The patient does take daily aspirin. She has diabetes mellitus and hypertension a significant risk factors for progressive coronary artery disease. Blood pressure is poorly controlled.   PMH: reviewed and listed in Problem List in Electronic Records (and see below) Past Medical History  Diagnosis Date  . Diabetes mellitus   . 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 Peripheral vascular disease      status post iliac stent July 2001   . Hypertension   . Hypercholesterolemia    Past Surgical History  Procedure Date  . Cervical fusion   . Coronary stent placement     Allergies/SH/FHX : available in Electronic Records for review  Allergies  Allergen Reactions  . Codeine Nausea And Vomiting   History   Social History  . Marital Status: Widowed    Spouse Name: N/A    Number of Children: N/A  . Years of Education: N/A   Occupational History  . Not on file.   Social History Main Topics  . Smoking  status: Former Smoker -- 0.5 packs/day for 10 years    Types: Cigarettes    Quit date: 10/05/1995  . Smokeless tobacco: Never Used  . Alcohol Use: Not on file  . Drug Use: Not on file  . Sexually Active: Not on file   Other Topics Concern  . Not on file   Social History Narrative  . No narrative on file   Family History  Problem Relation Age of Onset  . Heart failure Father   . Heart failure Mother   . Diabetes Mother   . COPD Brother     Medications: Current Outpatient Prescriptions  Medication Sig Dispense Refill  . ALPRAZolam (XANAX) 0.25 MG tablet Take 0.25 mg by mouth 2 (two) times daily.      [name] amLODipine (NORVASC) 10 MG tablet Take 1 tablet (10 mg total) by mouth daily.  30 tablet  6  . bisoprolol-hydrochlorothiazide (ZIAC) 2.5-6.25 MG per tablet Take 1 tablet by mouth daily.      . furosemide (LASIX) 40 MG tablet Take 40 mg by mouth daily.      . insulin glargine (LANTUS SOLOSTAR) 100 UNIT/ML injection Inject 50 Units into the skin 2 (two) times daily.      . insulin glulisine (APIDRA) 100 UNIT/ML injection Inject 15 Units into the skin 3 (three) times daily before meals.      [name] lisinopril (PRINIVIL,ZESTRIL) 40 MG tablet Take 1 tablet (40 mg total) by mouth daily.  30 tablet  6  .  omeprazole (PRILOSEC) 20 MG capsule Take 20 mg by mouth daily.      . polyethylene glycol (MIRALAX / GLYCOLAX) packet Take 17 g by mouth daily.      . rosuvastatin (CRESTOR) 10 MG tablet Take 10 mg by mouth daily.      [name] DISCONTD: amLODipine (NORVASC) 5 MG tablet Take 5 mg by mouth daily.      [name] DISCONTD: lisinopril (PRINIVIL,ZESTRIL) 20 MG tablet Take 20 mg by mouth daily.        ROS: No nausea or vomiting. No fever or chills.No melena or hematochezia.No bleeding.No claudication  Physical Exam: BP 154/72  Pulse 53  Ht 5\' 5"  (1.651 m)  Wt 189 lb (85.73 kg)  BMI 31.45 kg/m2 General: Well-nourished white female in no distress Neck: Normal carotid upstroke no carotid bruits. No  thyromegaly nonnodular thyroid. JVP is 6 cm. Lungs: Clear breath sounds bilaterally without wheezing Cardiac: Regular rate and rhythm with normal S1-S2. No pathological murmurs. PMI is nondisplaced Abdomen: Soft and nontender Vascular: No edema. Normal distal pulses bilaterally including dorsalis pedis and posterior tibial pulses Skin: Warm and dry Physcologic: Normal affect  12lead ECG: Normal sinus rhythm. Heart rate 57 beats per minute no acute ischemic changes Limited bedside ECHO:N/A No images are attached to the encounter.   Assessment and Plan  Peripheral vascular disease No evidence of peripheral vascular disease. Normal distal pulses bilaterally the patient does complain of weakness in the legs on exertion. I don't think the patient needs arterial Dopplers at this point.  Chest pain The patient has atypical chest pain. She reports sharp twinges in the left shoulder and left chest. Nevertheless given her multiple risk factor profile we will proceed with this on echocardiogram in conjunction with dobutamine stress echocardiogram.  Hypertension Blood pressure poorly controlled, I increased the patient's amlodipine to 10 mg a day and lisinopril to 40 mg a day. We will check a BMET in one week. She will also have an R.N. visit to followup on her blood pressure.  Coronary artery disease Dobutamine stress echocardiogram scheduled.  Hypercholesterolemia Followed by primary care physician.    Patient Active Problem List  Diagnoses  . Hypercholesterolemia  . Hypertension  . Peripheral vascular disease  . Coronary artery disease  . Diabetes mellitus  . Chest pain

## 2012-03-01 NOTE — Assessment & Plan Note (Signed)
Dobutamine stress echocardiogram scheduled.

## 2012-03-01 NOTE — Patient Instructions (Signed)
   Increase Lisinopril to 40mg  daily  Increase Amlodipine to 10mg  daily  Echo  Dobutamine Echo  Your physician recommends that you go to the [facility] lab for blood work in 7-10 days for BMET and BNP. If the results of your test are normal or stable, you will receive a letter.  If they are abnormal, the nurse will contact you by phone. Nurse visit in one week for blood pressure check - patient will call office with reading due to avoid another $40.00 co-pay.   Follow up in  3 months

## 2012-03-01 NOTE — Telephone Encounter (Signed)
Echo  Dobutamine Echo   Diagnosis : 414.05, 401.9, 786.50  Tuesday, June 4th, 2013  @ [facility]

## 2012-03-01 NOTE — Assessment & Plan Note (Signed)
Blood pressure poorly controlled, I increased the patient's amlodipine to 10 mg a day and lisinopril to 40 mg a day. We will check a BMET in one week. She will also have an R.N. visit to followup on her blood pressure.

## 2012-03-01 NOTE — Assessment & Plan Note (Signed)
Followed by primary care physician

## 2012-03-06 NOTE — Telephone Encounter (Signed)
Pt has [employer] Medicare Complete.  KGMW#[id number], expires 04-20-12.

## 2012-03-07 ENCOUNTER — Telehealth: Payer: Self-pay | Admitting: *Deleted

## 2012-03-07 DIAGNOSIS — R079 Chest pain, unspecified: Secondary | ICD-10-CM

## 2012-03-07 NOTE — Telephone Encounter (Signed)
Patient at [facility] today for dobutamine stress echo.  Victorino Dike called to give update on blood pressure reading.  Patient was to come for nurse visit, but did not want to pay another $40.00 co-pay.  BP today, sitting (before any testing) - 146/62.

## 2012-03-13 NOTE — Telephone Encounter (Signed)
Anything I need to do?

## 2012-03-13 NOTE — Telephone Encounter (Signed)
Recent medication increases.  You asked for nurse visit to recheck blood pressure, but she called with reading instead.  Patient did not want to pay another co-pay.  Need to give me disposition on BP below.

## 2012-03-17 ENCOUNTER — Telehealth: Payer: Self-pay | Admitting: *Deleted

## 2012-03-17 NOTE — Telephone Encounter (Signed)
Patient informed. 

## 2012-03-17 NOTE — Telephone Encounter (Signed)
Message copied by Eustace Moore on Fri Mar 17, 2012  9:38 AM ------      Message from: Learta Codding      Created: Sun Mar 12, 2012  8:21 PM       bmet bnp wnl no change in therapy

## 2012-03-17 NOTE — Telephone Encounter (Signed)
Message copied by Eustace Moore on Fri Mar 17, 2012  9:39 AM ------      Message from: Lewayne Bunting E      Created: Mon Mar 13, 2012  6:17 AM       Normal echocardiogram

## 2012-03-17 NOTE — Telephone Encounter (Signed)
Message copied by Eustace Moore on Fri Mar 17, 2012  9:40 AM ------      Message from: Learta Codding      Created: Sun Mar 12, 2012  8:23 PM       Normal stress echon

## 2012-03-22 ENCOUNTER — Telehealth: Payer: Self-pay | Admitting: *Deleted

## 2012-03-22 NOTE — Telephone Encounter (Signed)
Continue current meds 

## 2012-03-22 NOTE — Telephone Encounter (Signed)
C/O of ankles and top of feet really "puffed up"  Had stress test on 6/4 & thinks the last time she had this type of test, the same thing happened.  Currently taking Lasix 40mg  daily.  Does go down a little at night.

## 2012-03-22 NOTE — Telephone Encounter (Signed)
Patient notified of below.   

## 2012-03-29 NOTE — Telephone Encounter (Signed)
Patient didn't take Lasix 40 milligram p.o. B.i.d. x2 days and then returned to the usual dose.

## 2012-04-03 NOTE — Telephone Encounter (Signed)
Please clarify below.  ?? Didn't take ??

## 2012-04-03 NOTE — Telephone Encounter (Signed)
What I meant was patient needs to take Lasix 40 mg by mouth twice a day then resume previous dose

## 2012-04-05 NOTE — Telephone Encounter (Signed)
Left message to return call 

## 2012-04-05 NOTE — Telephone Encounter (Signed)
Patient notified and verbalized understanding.  Will call back if continue to have problems after doing this.

## 2012-04-20 ENCOUNTER — Telehealth: Payer: Self-pay | Admitting: *Deleted

## 2012-04-20 NOTE — Telephone Encounter (Signed)
Pt has NL LV and RV function, by recent echo. She has no known h/o of CHF, and had no documented LE edema, when last seen in office on 03/01/12. Recommend that she f/u with her primary MD, re: management of pedal edema.

## 2012-04-20 NOTE — Telephone Encounter (Signed)
Pt had recent NL echo. I recommend she f/u with her primary MD, re: management of pedal edema.

## 2012-04-20 NOTE — Telephone Encounter (Signed)
Patient returned call stating that she is continuing to have swelling in top of feet and ankles.  Did the twice a day x 2 days & areas went down.  No weight gain.  Now since back to daily dose, area swelling again.  Patient states she never had this problem until now, just since she had stress test.  Will forward message to PA to review as GD is currently out of office.   [facility]

## 2012-04-20 NOTE — Telephone Encounter (Signed)
Patient notified of below.  Suggested she see PMD ([facility]).  She verbalized understanding.  Advised if unable to get in with PMD, notify office & we can offer another provider in our [place] or [place] office for her to see in GD absence.

## 2012-06-02 ENCOUNTER — Encounter: Payer: Self-pay | Admitting: Cardiology

## 2012-06-02 ENCOUNTER — Ambulatory Visit (INDEPENDENT_AMBULATORY_CARE_PROVIDER_SITE_OTHER): Payer: Medicare Other | Admitting: Cardiology

## 2012-06-02 VITALS — BP 126/69 | HR 66 | Ht 65.0 in | Wt 188.0 lb

## 2012-06-02 DIAGNOSIS — L989 Disorder of the skin and subcutaneous tissue, unspecified: Secondary | ICD-10-CM

## 2012-06-02 DIAGNOSIS — I779 Disorder of arteries and arterioles, unspecified: Secondary | ICD-10-CM

## 2012-06-02 DIAGNOSIS — L309 Dermatitis, unspecified: Secondary | ICD-10-CM

## 2012-06-02 DIAGNOSIS — G4733 Obstructive sleep apnea (adult) (pediatric): Secondary | ICD-10-CM

## 2012-06-02 DIAGNOSIS — L259 Unspecified contact dermatitis, unspecified cause: Secondary | ICD-10-CM

## 2012-06-02 DIAGNOSIS — I739 Peripheral vascular disease, unspecified: Secondary | ICD-10-CM

## 2012-06-02 DIAGNOSIS — I2510 Atherosclerotic heart disease of native coronary artery without angina pectoris: Secondary | ICD-10-CM

## 2012-06-02 DIAGNOSIS — I10 Essential (primary) hypertension: Secondary | ICD-10-CM

## 2012-06-02 MED ORDER — BETAMETHASONE DIPROPIONATE 0.05 % EX CREA: TOPICAL_CREAM | Freq: Every evening | CUTANEOUS | Status: AC

## 2012-06-02 NOTE — Patient Instructions (Addendum)
   ABI's   Office will contact with results  Betamethasone topical cream - may use every evening to face.  FUTURE REFILLS FROM PMD (VYAS)

## 2012-06-07 ENCOUNTER — Encounter (INDEPENDENT_AMBULATORY_CARE_PROVIDER_SITE_OTHER): Payer: Medicare Other

## 2012-06-07 DIAGNOSIS — I739 Peripheral vascular disease, unspecified: Secondary | ICD-10-CM

## 2012-06-09 DIAGNOSIS — G4733 Obstructive sleep apnea (adult) (pediatric): Secondary | ICD-10-CM | POA: Insufficient documentation

## 2012-06-09 DIAGNOSIS — L309 Dermatitis, unspecified: Secondary | ICD-10-CM | POA: Insufficient documentation

## 2012-06-09 DIAGNOSIS — L989 Disorder of the skin and subcutaneous tissue, unspecified: Secondary | ICD-10-CM | POA: Insufficient documentation

## 2012-06-09 NOTE — Assessment & Plan Note (Signed)
Well-controlled no change in medical therapy necessary

## 2012-06-09 NOTE — Assessment & Plan Note (Signed)
Hemoglobin A1c apparent was 7.0 patient will followup with her primary care physician

## 2012-06-09 NOTE — Progress Notes (Signed)
Peyton Bottoms, MD, [facility]IM Board Certified in Adult Cardiovascular Medicine,Internal Medicine and Critical Care Medicine    CC:      followup patient with history of coronary artery disease.                                                                            HPI:       The patient has a history of coronary artery disease as well as peripheral vascular disease and is status post left iliac stent.  The patient does report pain in both lower extremities with symptoms that could be consistent with claudication.  Her symptoms aren't exercise related.  She also has developed a skin lesion on the left shin of the lower leg.  She has had no recent ABIs done. From a cardiac perspective she appears to be stable she had a recent dobutamine echocardiogram done which showed no ischemia.  She continues to report no chest pain or shortness of breath but at rest or on exertion.  Her limitation in exercise is mainly due to the pain in her lower extremities. She also reports problems with bilateral facial eczema although the patient thinks this is psoriasis. However she has no other lesions on elbows or knees  PMH: reviewed and listed in Problem List in Electronic Records (and see below) Past Medical History  Diagnosis Date  . Diabetes mellitus   . 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 Peripheral vascular disease      status post iliac stent July 2001   . Hypertension   . Hypercholesterolemia    Past Surgical History  Procedure Date  . Cervical fusion   . Coronary stent placement     Allergies/SH/FHX : available in Electronic Records for review  Allergies  Allergen Reactions  . Codeine Nausea And Vomiting   History   Social History  . Marital Status: Widowed    Spouse Name: N/A    Number of Children: N/A  . Years of Education: N/A   Occupational History  . Not on file.   Social  History Main Topics  . Smoking status: Former Smoker -- 0.5 packs/day for 10 years    Types: Cigarettes    Quit date: 10/05/1995  . Smokeless tobacco: Never Used  . Alcohol Use: Not on file  . Drug Use: Not on file  . Sexually Active: Not on file   Other Topics Concern  . Not on file   Social History Narrative  . No narrative on file   Family History  Problem Relation Age of Onset  . Heart failure Father   . Heart failure Mother   . Diabetes Mother   . COPD Brother     Medications: Current Outpatient Prescriptions  Medication Sig Dispense Refill  . amLODipine (NORVASC) 10 MG tablet Take 1 tablet (10 mg total) by mouth daily.  30 tablet  6  . aspirin 81 MG tablet Take 81 mg by mouth daily.      . bisoprolol-hydrochlorothiazide (ZIAC) 2.5-6.25 MG per tablet Take 1 tablet by mouth daily.      [name]  furosemide (LASIX) 40 MG tablet Take 40 mg by mouth daily.      . insulin glargine (LANTUS SOLOSTAR) 100 UNIT/ML injection Inject 55 Units into the skin 2 (two) times daily.       . insulin glulisine (APIDRA) 100 UNIT/ML injection Inject 16 Units into the skin 3 (three) times daily before meals.       [name] lisinopril (PRINIVIL,ZESTRIL) 40 MG tablet Take 1 tablet (40 mg total) by mouth daily.  30 tablet  6  . omeprazole (PRILOSEC) 20 MG capsule Take 20 mg by mouth daily.      . polyethylene glycol (MIRALAX / GLYCOLAX) packet Take 17 g by mouth daily.      . rosuvastatin (CRESTOR) 10 MG tablet Take 10 mg by mouth daily.      . temazepam (RESTORIL) 30 MG capsule Take 30 mg by mouth at bedtime.      . betamethasone dipropionate (DIPROLENE) 0.05 % cream Apply topically every evening.  15 g  1    ROS: No nausea or vomiting. No fever or chills.No melena or hematochezia.No bleeding.No claudication  Physical Exam: BP 126/69  Pulse 66  Ht 5\' 5"  (1.651 m)  Wt 188 lb (85.276 kg)  BMI 31.28 kg/m2 General:well-nourished female in no distress. Neck:normal carotid upstroke and no carotid  bruits. Lungs:clear breath sounds bilaterally no wheezing Cardiac:regular rate and rhythm with normal S1-S2 no murmurs rubs or gallops Vascular:no edema faint distal pulses Skin:skin lesion is noted in the history of present illness left lower shin Physcologic:normal affect  12lead ECG:not obtained Limited bedside ECHO:N/A No images are attached to the encounter.   I reviewed and summarized the old records. I reviewed ECG and prior blood work.  Assessment and Plan  Coronary artery disease No recurrent chest pain.  Negative dobutamine echocardiogram June 2013  Continue current medical therapy and risk factor modification  Facial eczema And given the patient prescription of betamethasone dipropionate topical 0.05% cream to use sparingly on the face.  If no resolution in the next couple of days I recommended for her to go see dermatology.  Peripheral vascular disease followup ABIs will be scheduled to come in light of the patient's left lower extremity skin lesion.  She has a dry and erythematous lesion on the left shin.  Obstructive sleep apnea Controlled with CPAP.  Hypertension Well-controlled no change in medical therapy necessary  Diabetes mellitus Hemoglobin A1c apparent was 7.0 patient will followup with her primary care physician    Patient Active Problem List  Diagnosis  . Hypercholesterolemia  . Hypertension  . Peripheral vascular disease  . Coronary artery disease  . Diabetes mellitus  . Mild carotid artery disease  . Skin lesion  . Facial eczema  . Obstructive sleep apnea

## 2012-06-09 NOTE — Assessment & Plan Note (Signed)
Controlled with CPAP.

## 2012-06-09 NOTE — Assessment & Plan Note (Signed)
And given the patient prescription of betamethasone dipropionate topical 0.05% cream to use sparingly on the face.  If no resolution in the next couple of days I recommended for her to go see dermatology.

## 2012-06-09 NOTE — Assessment & Plan Note (Signed)
followup ABIs will be scheduled to come in light of the patient's left lower extremity skin lesion.  She has a dry and erythematous lesion on the left shin.

## 2012-06-09 NOTE — Assessment & Plan Note (Addendum)
No recurrent chest pain.  Negative dobutamine echocardiogram June 2013  Continue current medical therapy and risk factor modification

## 2012-06-12 ENCOUNTER — Encounter: Payer: Self-pay | Admitting: *Deleted

## 2012-06-30 ENCOUNTER — Ambulatory Visit: Payer: Medicare Other | Admitting: Cardiology

## 2013-01-05 ENCOUNTER — Encounter: Payer: Self-pay | Admitting: Physician Assistant

## 2013-01-05 ENCOUNTER — Ambulatory Visit (INDEPENDENT_AMBULATORY_CARE_PROVIDER_SITE_OTHER): Payer: Medicare Other | Admitting: Physician Assistant

## 2013-01-05 VITALS — BP 110/67 | HR 66 | Ht 65.0 in | Wt 190.0 lb

## 2013-01-05 DIAGNOSIS — I739 Peripheral vascular disease, unspecified: Secondary | ICD-10-CM

## 2013-01-05 DIAGNOSIS — E7800 Pure hypercholesterolemia, unspecified: Secondary | ICD-10-CM

## 2013-01-05 DIAGNOSIS — L989 Disorder of the skin and subcutaneous tissue, unspecified: Secondary | ICD-10-CM

## 2013-01-05 DIAGNOSIS — I10 Essential (primary) hypertension: Secondary | ICD-10-CM

## 2013-01-05 MED ORDER — FUROSEMIDE 40 MG PO TABS: 40.0000 mg | ORAL_TABLET | Freq: Every day | ORAL | Status: DC

## 2013-01-05 MED ORDER — AMLODIPINE BESYLATE 10 MG PO TABS: 10.0000 mg | ORAL_TABLET | Freq: Every day | ORAL | Status: DC

## 2013-01-05 MED ORDER — LISINOPRIL 40 MG PO TABS: 40.0000 mg | ORAL_TABLET | Freq: Every day | ORAL | Status: DC

## 2013-01-05 NOTE — Assessment & Plan Note (Signed)
Well-controlled on current medication regimen. We'll renew prescriptions for amlodipine and lisinopril.

## 2013-01-05 NOTE — Progress Notes (Signed)
Primary Cardiologist: Simona Huh, MD (new)  HPI: Patient returns to clinic for routine followup, last seen in August 2013, by Dr. Andee Lineman. Followup LE duplex study ordered that time, revealing NL ABI and patent L iliac stents.  Patient denies any interim exertional CP and, in fact, reports increased exercise tolerance, since last OV. She has since run out of the following medications: Amlodipine, furosemide, and lisinopril. She reports recent routine lab work with Dr. Sherril Croon, who also follows her lipid profile.  Allergies  Allergen Reactions  . Codeine Nausea And Vomiting    Current Outpatient Prescriptions  Medication Sig Dispense Refill  . ALPRAZolam (XANAX) 0.25 MG tablet Take 0.25 mg by mouth at bedtime as needed.       [name] amLODipine (NORVASC) 10 MG tablet Take 1 tablet (10 mg total) by mouth daily.  30 tablet  6  . aspirin 81 MG tablet Take 81 mg by mouth daily.      . betamethasone dipropionate (DIPROLENE) 0.05 % cream Apply topically every evening.  15 g  1  . bisoprolol-hydrochlorothiazide (ZIAC) 2.5-6.25 MG per tablet Take 1 tablet by mouth daily.      . furosemide (LASIX) 40 MG tablet Take 1 tablet (40 mg total) by mouth daily.  30 tablet  6  . insulin glargine (LANTUS SOLOSTAR) 100 UNIT/ML injection Inject 55 Units into the skin 2 (two) times daily.       . insulin glulisine (APIDRA) 100 UNIT/ML injection Inject 16 Units into the skin 3 (three) times daily before meals.       [name] lisinopril (PRINIVIL,ZESTRIL) 40 MG tablet Take 1 tablet (40 mg total) by mouth daily.  30 tablet  6  . omeprazole (PRILOSEC) 20 MG capsule Take 20 mg by mouth daily.      [name] oxyCODONE-acetaminophen (PERCOCET) 10-325 MG per tablet Take 1 tablet by mouth every 8 (eight) hours as needed.       . polyethylene glycol (MIRALAX / GLYCOLAX) packet Take 17 g by mouth daily.      . rosuvastatin (CRESTOR) 10 MG tablet Take 10 mg by mouth daily.      . temazepam (RESTORIL) 30 MG capsule Take 30 mg by mouth at bedtime.        No current facility-administered medications for this visit.    Past Medical History  Diagnosis Date  . Diabetes mellitus   . 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 Peripheral vascular disease      status post iliac stent July 2001   . Hypertension   . Hypercholesterolemia     Past Surgical History  Procedure Laterality Date  . Cervical fusion    . Coronary stent placement      History   Social History  . Marital Status: Widowed    Spouse Name: N/A    Number of Children: N/A  . Years of Education: N/A   Occupational History  . Not on file.   Social History Main Topics  . Smoking status: Former Smoker -- 0.50 packs/day for 10 years    Types: Cigarettes    Quit date: 10/05/1995  . Smokeless tobacco: Never Used  . Alcohol Use: Not on file  . Drug Use: Not on file  . Sexually Active: Not on file   Other Topics Concern  . Not on file   Social History Narrative  . No narrative on file  Family History  Problem Relation Age of Onset  . Heart failure Father   . Heart failure Mother   . Diabetes Mother   . COPD Brother     ROS: no nausea, vomiting; no fever, chills; no melena, hematochezia; no claudication  PHYSICAL EXAM: BP 110/67  Pulse 66  Ht 5\' 5"  (1.651 m)  Wt 190 lb (86.183 kg)  BMI 31.62 kg/m2 GENERAL: 72 year-old female; NAD HEENT: NCAT, PERRLA, EOMI; sclera clear; no xanthelasma NECK: palpable bilateral carotid pulses, no bruits; no JVD; no TM LUNGS: CTA bilaterally CARDIAC: RRR (S1, S2); no significant murmurs; no rubs or gallops ABDOMEN: soft, protuberant EXTREMETIES: Trace peripheral edema SKIN: warm/dry; small circumferential erythematous lesion on the medial aspect of left ankle MUSCULOSKELETAL: no joint deformity NEURO: no focal deficit; NL affect   EKG:    ASSESSMENT & PLAN:  Coronary artery disease Quiescent on current  medication regimen. Negative dobutamine echocardiogram, June 2013.  Hypertension Well-controlled on current medication regimen. We'll renew prescriptions for amlodipine and lisinopril.  Peripheral vascular disease NL ABIs and patent left iliac stents, by followup LE arterial Dopplers, September 2013.  Skin lesion Patient remains concerned about this persistent lesion, which was present at time of last OV. I reassured her that this is not do to inadequate arterial circulation, based on the LE arterial Dopplers, and her last OV. I recommended that she followup with a dermatologist for further recommendations and treatment options.  Hypercholesterolemia Followed by primary M.D. We'll request most recent lipid profile. Continue current low-dose Crestor, pending further recommendations.    Gene Brynnlee Cumpian, PAC

## 2013-01-05 NOTE — Patient Instructions (Signed)
Continue all current medications. Your physician wants you to follow up in: 6 months.  You will receive a reminder letter in the mail one-two months in advance.  If you don't receive a letter, please call our office to schedule the follow up appointment   

## 2013-01-05 NOTE — Assessment & Plan Note (Signed)
Patient remains concerned about this persistent lesion, which was present at time of last OV. I reassured her that this is not do to inadequate arterial circulation, based on the LE arterial Dopplers, and her last OV. I recommended that she followup with a dermatologist for further recommendations and treatment options.

## 2013-01-05 NOTE — Assessment & Plan Note (Signed)
Followed by primary M.D. We'll request most recent lipid profile. Continue current low-dose Crestor, pending further recommendations.

## 2013-01-05 NOTE — Assessment & Plan Note (Signed)
Quiescent on current medication regimen. Negative dobutamine echocardiogram, June 2013.

## 2013-01-05 NOTE — Assessment & Plan Note (Signed)
NL ABIs and patent left iliac stents, by followup LE arterial Dopplers, September 2013.

## 2013-08-20 ENCOUNTER — Other Ambulatory Visit: Payer: Self-pay | Admitting: Physician Assistant

## 2013-08-24 ENCOUNTER — Encounter: Payer: Self-pay | Admitting: Cardiology

## 2013-09-06 ENCOUNTER — Encounter: Payer: Self-pay | Admitting: Cardiology

## 2013-09-13 ENCOUNTER — Encounter: Payer: Self-pay | Admitting: Cardiology

## 2013-09-13 ENCOUNTER — Ambulatory Visit (INDEPENDENT_AMBULATORY_CARE_PROVIDER_SITE_OTHER): Payer: Medicare Other | Admitting: Cardiology

## 2013-09-13 VITALS — BP 130/74 | HR 68 | Ht 65.0 in | Wt 187.0 lb

## 2013-09-13 DIAGNOSIS — I2510 Atherosclerotic heart disease of native coronary artery without angina pectoris: Secondary | ICD-10-CM

## 2013-09-13 DIAGNOSIS — I739 Peripheral vascular disease, unspecified: Secondary | ICD-10-CM

## 2013-09-13 DIAGNOSIS — I10 Essential (primary) hypertension: Secondary | ICD-10-CM

## 2013-09-13 NOTE — Progress Notes (Signed)
Clinical Summary Ms. Sosnowski is a 72 y.o.female last seen by PA Serpe, this is our first visit together. She was seen for the following medical problems.  1. CAD - prior PTCA to distal LAD in 2001. 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  - echo 06/2012 LVEF 60-65% - DSE 06/2012 with reported ST elevation in inferior leads, negative echo images for ischemia.  -denies any chest pain. Notes some occasional SOB. Reports DOE at 1 block which is chronic.  - compliant with meds: asa 81, bisoprolol, lisinopril, crestor   2. PAD - prior iliac stent in July 2001 - [place] 06/2012 Right ABI 0.99 Left ABI 0.99, with patent left iliac stent. - denies any claudication  3. HTN - compliant with meds - does not check regularly  4. Hyperlipidemia - compliant with crestor - followed by PCP, last panel 12/2011: LDL 70 HDL 42 TG 250 TC 163  5. OSA - compliant with CPAP  Past Medical History  Diagnosis Date  . Diabetes mellitus   . 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 Peripheral vascular disease      status post iliac stent July 2001   . Hypertension   . Hypercholesterolemia      Allergies  Allergen Reactions  . Codeine Nausea And Vomiting     Current Outpatient Prescriptions  Medication Sig Dispense Refill  . ALPRAZolam (XANAX) 0.25 MG tablet Take 0.25 mg by mouth at bedtime as needed.       [name] amLODipine (NORVASC) 10 MG tablet Take 1 tablet (10 mg total) by mouth daily.  30 tablet  6  . aspirin 81 MG tablet Take 81 mg by mouth daily.      . bisoprolol-hydrochlorothiazide (ZIAC) 2.5-6.25 MG per tablet Take 1 tablet by mouth daily.      . furosemide (LASIX) 40 MG tablet Take 1 tablet (40 mg total) by mouth daily.  30 tablet  6  . insulin glargine (LANTUS SOLOSTAR) 100 UNIT/ML injection Inject 55 Units into the skin 2 (two) times  daily.       . insulin glulisine (APIDRA) 100 UNIT/ML injection Inject 16 Units into the skin 3 (three) times daily before meals.       [name] lisinopril (PRINIVIL,ZESTRIL) 40 MG tablet TAKE 1 TABLET ONCE DAILY.  30 tablet  1  . omeprazole (PRILOSEC) 20 MG capsule Take 20 mg by mouth daily.      [name] oxyCODONE-acetaminophen (PERCOCET) 10-325 MG per tablet Take 1 tablet by mouth every 8 (eight) hours as needed.       . polyethylene glycol (MIRALAX / GLYCOLAX) packet Take 17 g by mouth daily.      . rosuvastatin (CRESTOR) 10 MG tablet Take 10 mg by mouth daily.      . temazepam (RESTORIL) 30 MG capsule Take 30 mg by mouth at bedtime.       No current facility-administered medications for this visit.     Past Surgical History  Procedure Laterality Date  . Cervical fusion    . Coronary stent placement       Allergies  Allergen Reactions  . Codeine Nausea And Vomiting      Family History  Problem Relation Age of Onset  . Heart failure Father   . Heart failure Mother   . Diabetes Mother   . COPD  Brother      Social History Ms. Frances reports that she quit smoking about 17 years ago. Her smoking use included Cigarettes. She has a 5 pack-year smoking history. She has never used smokeless tobacco. Ms. HUGHLEY has no alcohol history on file.   Review of Systems CONSTITUTIONAL: No weight loss, fever, chills, weakness or fatigue.  HEENT: Eyes: No visual loss, blurred vision, double vision or yellow sclerae.No hearing loss, sneezing, congestion, runny nose or sore throat.  SKIN: No rash or itching.  CARDIOVASCULAR: per HPI RESPIRATORY: No shortness of breath, cough or sputum.  GASTROINTESTINAL: No anorexia, nausea, vomiting or diarrhea. No abdominal pain or blood.  GENITOURINARY: No burning on urination, no polyuria NEUROLOGICAL: No headache, dizziness, syncope, paralysis, ataxia, numbness or tingling in the extremities. No change in bowel or bladder control.  MUSCULOSKELETAL: No muscle,  back pain, joint pain or stiffness.  LYMPHATICS: No enlarged nodes. No history of splenectomy.  PSYCHIATRIC: No history of depression or anxiety.  ENDOCRINOLOGIC: No reports of sweating, cold or heat intolerance. No polyuria or polydipsia.  [name]   Physical Examination p 68 bp 130/74 Wt 187 lbs BMI 31 Gen: resting comfortably, no acute distress HEENT: no scleral icterus, pupils equal round and reactive, no palptable cervical adenopathy,  CV: RRR, no m/r/g, no JVD, no carotid bruits Resp: Clear to auscultation bilaterally GI: abdomen is soft, non-tender, non-distended, normal bowel sounds, no hepatosplenomegaly MSK: extremities are warm, no edema.  Skin: warm, no rash Neuro:  no focal deficits Psych: appropriate affect   Diagnostic Studies 11/1999 Cath HEMODYNAMIC DATA: Aortic pressure was initially extremely high at 215/92. She  was  given intravenous enalaprilat, intravenous labetalol, and intravenous  nitroglycerin, ultimately bringing her systolic blood pressure down to less  than  160. Left ventricular pressure was 180/17, with a corresponding aortic  pressure of  180/80. There was no aortic valve gradient.  LEFT VENTRICULOGRAM: Wall motion was normal. Ejection fraction was estimated  t  65%. No mitral regurgitation.  ABDOMINAL AORTOGRAM: This revealed patent renal arteries and abdominal aorta.  The  left common iliac artery had a 75% stenosis. The right external iliac artery  had a  40% stenosis.  CORONARY ARTERIOGRAPHY (Right dominant):  1. The left main was normal.  2. The left anterior descending artery had a 25% stenosis in the proximal to  mid  vessel. The distal LAD had a 50% stenosis just after the bifurcation of the  large second diagonal Arshia Spellman. Further down the distal LAD is a focal 80%  stenosis.  3. The left circumflex had a 20% stenosis in the mid vessel. It gave rise to a  small OM-1, a normal sized OM-2 which had a 25% stenosis, and a normal sized  OM-3  which had a 20% stenosis.  4. The right coronary artery had a diffuse 40% stenosis extending from the mid  o  distal vessel. There was a large posterior descending artery which had a  25%,  followed by a 40% stenosis. There were two small posterolateral branches.  IMPRESSIONS:  1. Normal left ventricular systolic function.  2. Peripheral vascular disease, as described.  3. One-vessel coronary artery disease with significant stenosis in the distal  left  anterior descending artery. This appears to correspond with her EKG changes  and  Cardiolite scan. She has moderate, but non-flow limiting disease in the  right coronary and left circumflex.  PLAN: Percutaneous intervention of the distal LAD, see below.  PTCA PROCEDURAL NOTE: Following the completion of  the diagnostic  catheterization,  we opted to proceed with percutaneous intervention. The preexisting 6-French  sheath in the right femoral artery was exchanged over a wire for a 7-French  sheath.  Integrilin and heparin were administered per protocol. We used a 7-French JL-4  guiding catheter and a BMW wire. The reference vessel was approximately 2 mm  in  diameter. We utilized a 2.0 x 20 mm CrossSail balloon, which was inflated  initially to 12 atm x 2 minutes and then 14 atm x 3 minutes. Final  angiographic  images revealed significant improvement in the lumen with 25% residual stenosis  and  a possible very small non-flow limiting dissection. There was TIMI-3 flow into  the  distal vessel.  COMPLICATIONS: None.  RESULTS: Successful percutaneous transluminal coronary angioplasty of the  distal  left anterior descending artery, reducing an 80% stenosis to 25% residual with  TIMI-3 flow.  PLAN: Integrilin will be continued for 20 hours. The patient needs aggressive  blood pressure control and risk factor reduction.  In regards to her peripheral vascular disease, she will be referred for further  evaluation and possible  intervention.  03/2012 Echo LVEF 60-65%, no significant abnormalities   Assessment and Plan   1. CAD - no current symptoms - recent negative DSE 06/2012 - continue secondary prevention and risk factor modfication  2. PAD - no current symptoms - recent ABI 06/2012 were normal - continue medical management  3. HTN - at goal, continue current medications  4. Hyperlipidemia - followed by PCP, no recent panel in our system. One year ago she was at goal - continue current statin  5. OS - compliant with CPAP, continue  F/u 1 year   Antoine Poche, M.D., F.A.C.C.

## 2013-09-13 NOTE — Patient Instructions (Signed)
Your physician recommends that you schedule a follow-up appointment in: 1 year with Dr. Branch. You should receive a letter in the mail in 10 months. If you do not receive this letter by October 2015 call our office to schedule this appointment.   Your physician recommends that you continue on your current medications as directed. Please refer to the Current Medication list given to you today.  

## 2013-09-17 ENCOUNTER — Telehealth: Payer: Self-pay | Admitting: Cardiology

## 2013-09-17 MED ORDER — FUROSEMIDE 40 MG PO TABS: 40.0000 mg | ORAL_TABLET | Freq: Every day | ORAL | Status: DC

## 2013-09-17 NOTE — Telephone Encounter (Signed)
[name] [name] needs new RX called into [facility] for Lasix -states she is suppose to be taking 2 Tabs daily now.

## 2013-09-17 NOTE — Telephone Encounter (Signed)
Attempted to reach patient to inform her that rx sent for lasix with dose and directions we have on file, but none of the numbers are working numbers.

## 2013-09-18 ENCOUNTER — Telehealth: Payer: Self-pay | Admitting: Cardiology

## 2013-09-18 NOTE — Telephone Encounter (Signed)
Patient called stating the MD told her at last ov to take Lasix 40 MG twice daily. According to last OV note there was no medication changes. Please advise.

## 2013-09-19 MED ORDER — FUROSEMIDE 40 MG PO TABS: ORAL_TABLET | ORAL | Status: DC

## 2013-09-19 NOTE — Telephone Encounter (Signed)
Reviewed my note. I often tell my patients if they notice increased swelling or SOB they can take extra lasix on that day. So for her, if she notes these symptoms taking twice a day is ok. Otherwise would continue 40mg  daily as her normal dose.   Dina Rich MD

## 2013-09-19 NOTE — Telephone Encounter (Signed)
Pt informed. Sent new refill request to pharmacy stating that patient may take an extra dose if she needs it for shortness of breath or swelling.

## 2013-10-15 ENCOUNTER — Ambulatory Visit: Payer: Self-pay | Admitting: General Practice

## 2013-10-19 ENCOUNTER — Ambulatory Visit: Payer: Self-pay | Admitting: General Practice

## 2013-10-19 ENCOUNTER — Other Ambulatory Visit: Payer: Self-pay | Admitting: Cardiology

## 2013-10-26 ENCOUNTER — Encounter: Payer: Self-pay | Admitting: General Practice

## 2013-10-26 ENCOUNTER — Encounter (INDEPENDENT_AMBULATORY_CARE_PROVIDER_SITE_OTHER): Payer: Self-pay

## 2013-10-26 ENCOUNTER — Ambulatory Visit (INDEPENDENT_AMBULATORY_CARE_PROVIDER_SITE_OTHER): Payer: Medicare Other | Admitting: General Practice

## 2013-10-26 VITALS — BP 140/63 | HR 73 | Temp 97.2°F | Ht 65.0 in | Wt 189.0 lb

## 2013-10-26 DIAGNOSIS — M899 Disorder of bone, unspecified: Secondary | ICD-10-CM

## 2013-10-26 DIAGNOSIS — G4700 Insomnia, unspecified: Secondary | ICD-10-CM

## 2013-10-26 DIAGNOSIS — E785 Hyperlipidemia, unspecified: Secondary | ICD-10-CM

## 2013-10-26 DIAGNOSIS — E119 Type 2 diabetes mellitus without complications: Secondary | ICD-10-CM

## 2013-10-26 DIAGNOSIS — M8580 Other specified disorders of bone density and structure, unspecified site: Secondary | ICD-10-CM

## 2013-10-26 DIAGNOSIS — J45909 Unspecified asthma, uncomplicated: Secondary | ICD-10-CM

## 2013-10-26 DIAGNOSIS — Z7689 Persons encountering health services in other specified circumstances: Secondary | ICD-10-CM

## 2013-10-26 DIAGNOSIS — M949 Disorder of cartilage, unspecified: Secondary | ICD-10-CM

## 2013-10-26 DIAGNOSIS — K5900 Constipation, unspecified: Secondary | ICD-10-CM

## 2013-10-26 DIAGNOSIS — K5909 Other constipation: Secondary | ICD-10-CM

## 2013-10-26 DIAGNOSIS — I2510 Atherosclerotic heart disease of native coronary artery without angina pectoris: Secondary | ICD-10-CM

## 2013-10-26 DIAGNOSIS — I10 Essential (primary) hypertension: Secondary | ICD-10-CM

## 2013-10-29 DIAGNOSIS — G4700 Insomnia, unspecified: Secondary | ICD-10-CM | POA: Insufficient documentation

## 2013-10-29 DIAGNOSIS — I10 Essential (primary) hypertension: Secondary | ICD-10-CM | POA: Insufficient documentation

## 2013-10-29 DIAGNOSIS — E785 Hyperlipidemia, unspecified: Secondary | ICD-10-CM | POA: Insufficient documentation

## 2013-10-29 DIAGNOSIS — K5909 Other constipation: Secondary | ICD-10-CM | POA: Insufficient documentation

## 2013-10-29 DIAGNOSIS — J45909 Unspecified asthma, uncomplicated: Secondary | ICD-10-CM | POA: Insufficient documentation

## 2013-10-29 DIAGNOSIS — I2510 Atherosclerotic heart disease of native coronary artery without angina pectoris: Secondary | ICD-10-CM | POA: Insufficient documentation

## 2013-10-29 DIAGNOSIS — M8580 Other specified disorders of bone density and structure, unspecified site: Secondary | ICD-10-CM | POA: Insufficient documentation

## 2013-10-29 DIAGNOSIS — E1159 Type 2 diabetes mellitus with other circulatory complications: Secondary | ICD-10-CM | POA: Insufficient documentation

## 2013-10-29 NOTE — Progress Notes (Signed)
   Subjective:    Patient ID: [name] [name], female    DOB: 12-14-1940, 73 y.o.   MRN: [id number]  HPI Patient presents today to establish care. She reports being seen by provider in [place], [place], but that is too far for her to drive. She has history of HTN, CAD, OSA, HLD, DM type 2, insomnia, asthma, chronic constipation, and osteopenia. She also has chronic pain and is taking prescribed percocet. I discussed with patient that her chronic pain would need to be managed elsewhere (such as a pain management clinic). Also she may be referred back to ortho since still having pain (ortho surgery 1 year ago). The other chronic health conditions could be managed at this office. Patient verbalized understanding.     Review of Systems  Constitutional: Negative for fever and chills.  Respiratory: Negative for chest tightness and shortness of breath.   Cardiovascular: Negative for chest pain and palpitations.       Chronic constipation  Gastrointestinal: Positive for constipation. Negative for nausea, vomiting, abdominal pain, diarrhea, blood in stool and abdominal distention.  Genitourinary: Negative for dysuria, hematuria and difficulty urinating.  Musculoskeletal:       Chronic right shoulder pain  Neurological: Negative for dizziness, weakness and headaches.       Objective:   Physical Exam  Constitutional: She is oriented to person, place, and time. She appears well-developed and well-nourished.  Eyes: Pupils are equal, round, and reactive to light.  Neck: Normal range of motion. Neck supple. No thyromegaly present.  Cardiovascular: Normal rate, regular rhythm and normal heart sounds.   Pulmonary/Chest: Effort normal and breath sounds normal. No respiratory distress. She exhibits no tenderness.  Abdominal: Soft. Bowel sounds are normal. She exhibits no distension. There is no tenderness.  Lymphadenopathy:    She has no cervical adenopathy.  Neurological: She is alert and oriented to person,  place, and time.  Skin: Skin is warm and dry.  Psychiatric: She has a normal mood and affect.          Assessment & Plan:  1. Encounter to establish care   2. HTN (hypertension)   3. HLD (hyperlipidemia)   4. CAD (coronary artery disease)   5. DM type 2 (diabetes mellitus, type 2)   6. Asthma   7. Osteopenia   8. Insomnia   9. Chronic constipation -Patient to sign release for medical records -RTO in one month for chronic health follow -Patient verbalized understanding Erby Pian, FNP-C

## 2013-10-29 NOTE — Patient Instructions (Signed)
Health Maintenance, Female A healthy lifestyle and preventative care can promote health and wellness.  Maintain regular health, dental, and eye exams.  Eat a healthy diet. Foods like vegetables, fruits, whole grains, low-fat dairy products, and lean protein foods contain the nutrients you need without too many calories. Decrease your intake of foods high in solid fats, added sugars, and salt. Get information about a proper diet from your caregiver, if necessary.  Regular physical exercise is one of the most important things you can do for your health. Most adults should get at least 150 minutes of moderate-intensity exercise (any activity that increases your heart rate and causes you to sweat) each week. In addition, most adults need muscle-strengthening exercises on 2 or more days a week.   Maintain a healthy weight. The body mass index (BMI) is a screening tool to identify possible weight problems. It provides an estimate of body fat based on height and weight. Your caregiver can help determine your BMI, and can help you achieve or maintain a healthy weight. For adults 20 years and older:  A BMI below 18.5 is considered underweight.  A BMI of 18.5 to 24.9 is normal.  A BMI of 25 to 29.9 is considered overweight.  A BMI of 30 and above is considered obese.  Maintain normal blood lipids and cholesterol by exercising and minimizing your intake of saturated fat. Eat a balanced diet with plenty of fruits and vegetables. Blood tests for lipids and cholesterol should begin at age 20 and be repeated every 5 years. If your lipid or cholesterol levels are high, you are over 50, or you are a high risk for heart disease, you may need your cholesterol levels checked more frequently.Ongoing high lipid and cholesterol levels should be treated with medicines if diet and exercise are not effective.  If you smoke, find out from your caregiver how to quit. If you do not use tobacco, do not start.  Lung  cancer screening is recommended for adults aged 55 80 years who are at high risk for developing lung cancer because of a history of smoking. Yearly low-dose computed tomography (CT) is recommended for people who have at least a 30-pack-year history of smoking and are a current smoker or have quit within the past 15 years. A pack year of smoking is smoking an average of 1 pack of cigarettes a day for 1 year (for example: 1 pack a day for 30 years or 2 packs a day for 15 years). Yearly screening should continue until the smoker has stopped smoking for at least 15 years. Yearly screening should also be stopped for people who develop a health problem that would prevent them from having lung cancer treatment.  If you are pregnant, do not drink alcohol. If you are breastfeeding, be very cautious about drinking alcohol. If you are not pregnant and choose to drink alcohol, do not exceed 1 drink per day. One drink is considered to be 12 ounces (355 mL) of beer, 5 ounces (148 mL) of wine, or 1.5 ounces (44 mL) of liquor.  Avoid use of street drugs. Do not share needles with anyone. Ask for help if you need support or instructions about stopping the use of drugs.  High blood pressure causes heart disease and increases the risk of stroke. Blood pressure should be checked at least every 1 to 2 years. Ongoing high blood pressure should be treated with medicines, if weight loss and exercise are not effective.  If you are 55 to   72 years old, ask your caregiver if you should take aspirin to prevent strokes.  Diabetes screening involves taking a blood sample to check your fasting blood sugar level. This should be done once every 3 years, after age 64, if you are within normal weight and without risk factors for diabetes. Testing should be considered at a younger age or be carried out more frequently if you are overweight and have at least 1 risk factor for diabetes.  Breast cancer screening is essential preventative care  for women. You should practice "breast self-awareness." This means understanding the normal appearance and feel of your breasts and may include breast self-examination. Any changes detected, no matter how small, should be reported to a caregiver. Women in their 15s and 30s should have a clinical breast exam (CBE) by a caregiver as part of a regular health exam every 1 to 3 years. After age 59, women should have a CBE every year. Starting at age 58, women should consider having a mammogram (breast X-ray) every year. Women who have a family history of breast cancer should talk to their caregiver about genetic screening. Women at a high risk of breast cancer should talk to their caregiver about having an MRI and a mammogram every year.  Breast cancer gene (BRCA)-related cancer risk assessment is recommended for women who have family members with BRCA-related cancers. BRCA-related cancers include breast, ovarian, tubal, and peritoneal cancers. Having family members with these cancers may be associated with an increased risk for harmful changes (mutations) in the breast cancer genes BRCA1 and BRCA2. Results of the assessment will determine the need for genetic counseling and BRCA1 and BRCA2 testing.  The Pap test is a screening test for cervical cancer. Women should have a Pap test starting at age 79. Between ages 70 and 6, Pap tests should be repeated every 2 years. Beginning at age 66, you should have a Pap test every 3 years as long as the past 3 Pap tests have been normal. If you had a hysterectomy for a problem that was not cancer or a condition that could lead to cancer, then you no longer need Pap tests. If you are between ages 60 and 16, and you have had normal Pap tests going back 10 years, you no longer need Pap tests. If you have had past treatment for cervical cancer or a condition that could lead to cancer, you need Pap tests and screening for cancer for at least 20 years after your treatment. If Pap  tests have been discontinued, risk factors (such as a new sexual partner) need to be reassessed to determine if screening should be resumed. Some women have medical problems that increase the chance of getting cervical cancer. In these cases, your caregiver may recommend more frequent screening and Pap tests.  The human papillomavirus (HPV) test is an additional test that may be used for cervical cancer screening. The HPV test looks for the virus that can cause the cell changes on the cervix. The cells collected during the Pap test can be tested for HPV. The HPV test could be used to screen women aged 4 years and older, and should be used in women of any age who have unclear Pap test results. After the age of 64, women should have HPV testing at the same frequency as a Pap test.  Colorectal cancer can be detected and often prevented. Most routine colorectal cancer screening begins at the age of 3 and continues through age 42. However, your caregiver  may recommend screening at an earlier age if you have risk factors for colon cancer. On a yearly basis, your caregiver may provide home test kits to check for hidden blood in the stool. Use of a small camera at the end of a tube, to directly examine the colon (sigmoidoscopy or colonoscopy), can detect the earliest forms of colorectal cancer. Talk to your caregiver about this at age 73, when routine screening begins. Direct examination of the colon should be repeated every 5 to 10 years through age 61, unless early forms of pre-cancerous polyps or small growths are found.  Hepatitis C blood testing is recommended for all people born from [birth year] through 1964 and any individual with known risks for hepatitis C.  Practice safe sex. Use condoms and avoid high-risk sexual practices to reduce the spread of sexually transmitted infections (STIs). Sexually active women aged 38 and younger should be checked for Chlamydia, which is a common sexually transmitted infection.  Older women with new or multiple partners should also be tested for Chlamydia. Testing for other STIs is recommended if you are sexually active and at increased risk.  Osteoporosis is a disease in which the bones lose minerals and strength with aging. This can result in serious bone fractures. The risk of osteoporosis can be identified using a bone density scan. Women ages 44 and over and women at risk for fractures or osteoporosis should discuss screening with their caregivers. Ask your caregiver whether you should be taking a calcium supplement or vitamin D to reduce the rate of osteoporosis.  Menopause can be associated with physical symptoms and risks. Hormone replacement therapy is available to decrease symptoms and risks. You should talk to your caregiver about whether hormone replacement therapy is right for you.  Use sunscreen. Apply sunscreen liberally and repeatedly throughout the day. You should seek shade when your shadow is shorter than you. Protect yourself by wearing long sleeves, pants, a wide-brimmed hat, and sunglasses year round, whenever you are outdoors.  Notify your caregiver of new moles or changes in moles, especially if there is a change in shape or color. Also notify your caregiver if a mole is larger than the size of a pencil eraser.  Stay current with your immunizations. Document Released: 04/05/2011 Document Revised: 01/15/2013 Document Reviewed: 04/05/2011 [facility] Patient Information 2014 [place].

## 2013-11-06 ENCOUNTER — Other Ambulatory Visit: Payer: Medicare Other

## 2013-11-13 ENCOUNTER — Encounter: Payer: Medicare Other | Admitting: General Practice

## 2013-12-11 ENCOUNTER — Other Ambulatory Visit: Payer: Self-pay | Admitting: Cardiology

## 2014-03-26 ENCOUNTER — Encounter: Payer: Medicare Other | Admitting: General Practice

## 2014-04-02 ENCOUNTER — Other Ambulatory Visit: Payer: Self-pay | Admitting: Cardiology

## 2014-04-29 ENCOUNTER — Other Ambulatory Visit: Payer: Self-pay | Admitting: Cardiology

## 2014-07-15 ENCOUNTER — Other Ambulatory Visit: Payer: Self-pay | Admitting: Cardiology

## 2014-08-22 ENCOUNTER — Ambulatory Visit (INDEPENDENT_AMBULATORY_CARE_PROVIDER_SITE_OTHER): Payer: Medicare Other | Admitting: Cardiology

## 2014-08-22 ENCOUNTER — Encounter: Payer: Self-pay | Admitting: Cardiology

## 2014-08-22 VITALS — BP 109/66 | HR 64 | Ht 65.5 in | Wt 182.0 lb

## 2014-08-22 DIAGNOSIS — I739 Peripheral vascular disease, unspecified: Secondary | ICD-10-CM

## 2014-08-22 DIAGNOSIS — I10 Essential (primary) hypertension: Secondary | ICD-10-CM

## 2014-08-22 DIAGNOSIS — E7800 Pure hypercholesterolemia, unspecified: Secondary | ICD-10-CM

## 2014-08-22 DIAGNOSIS — I2510 Atherosclerotic heart disease of native coronary artery without angina pectoris: Secondary | ICD-10-CM

## 2014-08-22 NOTE — Patient Instructions (Signed)
Continue all current medications. Your physician wants you to follow up in:  1 year.  You will receive a reminder letter in the mail one-two months in advance.  If you don't receive a letter, please call our office to schedule the follow up appointment   

## 2014-08-22 NOTE — Progress Notes (Signed)
Clinical Summary Ms. Robson is a 73 y.o.female seen today for follow up of the following medical problems.  1. CAD - prior PTCA to distal LAD in 2001. 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  - echo 06/2012 LVEF 60-65% - DSE 06/2012 with reported ST elevation in inferior leads, negative echo images for ischemia.   - since last visit denies any chest pain, SOB, DOE - compliant with meds  2. PAD - prior iliac stent in July 2001 - [place] 06/2012 Right ABI 0.99 Left ABI 0.99, with patent left iliac stent.  - denies any claudication like pain, denies any foot sores or ulcers  3. HTN - compliant with meds - does not check bp at home   4. Hyperlipidemia - compliant with crestor - lipids checked regularly by pcp.     Past Medical History  Diagnosis Date  . Diabetes mellitus   . 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 Peripheral vascular disease      status post iliac stent July 2001   . Hypertension   . Hypercholesterolemia      Allergies  Allergen Reactions  . Codeine Nausea And Vomiting  . Hydrocodone Nausea And Vomiting     Current Outpatient Prescriptions  Medication Sig Dispense Refill  . alendronate (FOSAMAX) 70 MG tablet Take 70 mg by mouth once a week. Take with a full glass of water on an empty stomach.    [name] amLODipine (NORVASC) 10 MG tablet TAKE (1) TABLET BY MOUTH ONCE DAILY. 30 tablet 3  . aspirin 81 MG tablet Take 81 mg by mouth daily.    . bisoprolol-hydrochlorothiazide (ZIAC) 2.5-6.25 MG per tablet Take 1 tablet by mouth daily.    . Calcium Carbonate-Vit D-Min (CALCIUM 1200 PO) Take by mouth.    . ENDOCET 10-325 MG per tablet     . eszopiclone (LUNESTA) 2 MG TABS tablet Take 1 tablet by mouth at bedtime.    . furosemide (LASIX) 40 MG tablet TAKE 1 TABLET ONCE DAILY. MAY TAKE EXTRA DOSE IF NEEDED  FOR SWELLING OR SHORTNESS OF BREATH. 45 tablet 6  . insulin glargine (LANTUS SOLOSTAR) 100 UNIT/ML injection Inject 65 Units into the skin at bedtime.     . insulin glulisine (APIDRA) 100 UNIT/ML injection Inject 16 Units into the skin 3 (three) times daily before meals.     [name] lisinopril (PRINIVIL,ZESTRIL) 40 MG tablet TAKE (1) TABLET BY MOUTH ONCE DAILY. 30 tablet 6  . omeprazole (PRILOSEC) 20 MG capsule Take 20 mg by mouth 2 (two) times daily before a meal.     . ONE TOUCH ULTRA TEST test strip     . polyethylene glycol (MIRALAX / GLYCOLAX) packet Take 17 g by mouth daily.    . rosuvastatin (CRESTOR) 10 MG tablet Take 10 mg by mouth daily.    . SYMBICORT 160-4.5 MCG/ACT inhaler      No current facility-administered medications for this visit.     Past Surgical History  Procedure Laterality Date  . Cervical fusion    . Coronary stent placement       Allergies  Allergen Reactions  . Codeine Nausea And Vomiting  . Hydrocodone Nausea And Vomiting      Family History  Problem Relation Age of Onset  . Heart failure Father   . Heart failure Mother   .  Diabetes Mother   . COPD Brother      Social History Ms. Caraway reports that she quit smoking about 18 years ago. Her smoking use included Cigarettes. She has a 5 pack-year smoking history. She has never used smokeless tobacco. Ms. CINO has no alcohol history on file.   Review of Systems CONSTITUTIONAL: No weight loss, fever, chills, weakness or fatigue.  HEENT: Eyes: No visual loss, blurred vision, double vision or yellow sclerae.No hearing loss, sneezing, congestion, runny nose or sore throat.  SKIN: No rash or itching.  CARDIOVASCULAR: per HPI RESPIRATORY: No shortness of breath, cough or sputum.  GASTROINTESTINAL: No anorexia, nausea, vomiting or diarrhea. No abdominal pain or blood.  GENITOURINARY: No burning on urination, no polyuria NEUROLOGICAL: No headache, dizziness, syncope, paralysis, ataxia, numbness or  tingling in the extremities. No change in bowel or bladder control.  MUSCULOSKELETAL: No muscle, back pain, joint pain or stiffness.  LYMPHATICS: No enlarged nodes. No history of splenectomy.  PSYCHIATRIC: No history of depression or anxiety.  ENDOCRINOLOGIC: No reports of sweating, cold or heat intolerance. No polyuria or polydipsia.  [name]   Physical Examination p 64 bp 109/66 Wt 182 lbs BMI 30 Gen: resting comfortably, no acute distress HEENT: no scleral icterus, pupils equal round and reactive, no palptable cervical adenopathy,  CV: RRR, 2/6 systolic murmur RUSB, no JVD, no carotid bruits Resp: Clear to auscultation bilaterally GI: abdomen is soft, non-tender, non-distended, normal bowel sounds, no hepatosplenomegaly MSK: extremities are warm, no edema.  Skin: warm, no rash Neuro:  no focal deficits Psych: appropriate affect   Diagnostic Studies 11/1999 Cath HEMODYNAMIC DATA: Aortic pressure was initially extremely high at 215/92. She  was  given intravenous enalaprilat, intravenous labetalol, and intravenous  nitroglycerin, ultimately bringing her systolic blood pressure down to less  than  160. Left ventricular pressure was 180/17, with a corresponding aortic  pressure of  180/80. There was no aortic valve gradient.  LEFT VENTRICULOGRAM: Wall motion was normal. Ejection fraction was estimated  t  65%. No mitral regurgitation.  ABDOMINAL AORTOGRAM: This revealed patent renal arteries and abdominal aorta.  The  left common iliac artery had a 75% stenosis. The right external iliac artery  had a  40% stenosis.  CORONARY ARTERIOGRAPHY (Right dominant):  1. The left main was normal.  2. The left anterior descending artery had a 25% stenosis in the proximal to  mid  vessel. The distal LAD had a 50% stenosis just after the bifurcation of the  large second diagonal Kirt Chew. Further down the distal LAD is a focal 80%  stenosis.  3. The left circumflex had a  20% stenosis in the mid vessel. It gave rise to a  small OM-1, a normal sized OM-2 which had a 25% stenosis, and a normal sized  OM-3 which had a 20% stenosis.  4. The right coronary artery had a diffuse 40% stenosis extending from the mid  o  distal vessel. There was a large posterior descending artery which had a  25%,  followed by a 40% stenosis. There were two small posterolateral branches.  IMPRESSIONS:  1. Normal left ventricular systolic function.  2. Peripheral vascular disease, as described.  3. One-vessel coronary artery disease with significant stenosis in the distal  left  anterior descending artery. This appears to correspond with her EKG changes  and  Cardiolite scan. She has moderate, but non-flow limiting disease in the  right coronary and left circumflex.  PLAN: Percutaneous intervention of the distal LAD, see below.  PTCA PROCEDURAL NOTE: Following the completion of the diagnostic  catheterization,  we opted to proceed with percutaneous intervention. The preexisting 6-French  sheath in the right femoral artery was exchanged over a wire for a 7-French  sheath.  Integrilin and heparin were administered per protocol. We used a 7-French JL-4  guiding catheter and a BMW wire. The reference vessel was approximately 2 mm  in  diameter. We utilized a 2.0 x 20 mm CrossSail balloon, which was inflated  initially to 12 atm x 2 minutes and then 14 atm x 3 minutes. Final  angiographic  images revealed significant improvement in the lumen with 25% residual stenosis  and  a possible very small non-flow limiting dissection. There was TIMI-3 flow into  the  distal vessel.  COMPLICATIONS: None.  RESULTS: Successful percutaneous transluminal coronary angioplasty of the  distal  left anterior descending artery, reducing an 80% stenosis to 25% residual with  TIMI-3 flow.  PLAN: Integrilin will be continued for 20 hours. The patient needs  aggressive  blood pressure control and risk factor reduction.  In regards to her peripheral vascular disease, she will be referred for further  evaluation and possible intervention.  03/2012 Echo LVEF 60-65%, no significant abnormalities    Assessment and Plan  1. CAD - no current symptoms - continue secondary prevention and risk factor modfication  2. PAD - no current symptoms - continue medical management  3. HTN - at goal, continue current medications  4. Hyperlipidemia - followed by PCP, no recent panel in our system. Continue current statin, request most recent labs  5. OSA - compliant with CPAP, continue   F/u 1 year   [name], M.D.

## 2015-03-13 ENCOUNTER — Telehealth: Payer: Self-pay | Admitting: Cardiology

## 2015-03-13 NOTE — Telephone Encounter (Signed)
Can add her on to my schedule tomorrow in afternoon in [place] if she is willing to come here  Zandra Abts MD

## 2015-03-13 NOTE — Telephone Encounter (Signed)
Will f/u in 2 weeks.

## 2015-03-13 NOTE — Telephone Encounter (Signed)
199/83  223/88 this am  Pt pcp/urgent care d/c'd amlodipine and lisinopril due to tongue swelling. Denies chest pain, dizziness. C/o headache. Says she was in [facility] 3 months ago pcp has been managing BP and told pt to keep BP log. Pt requesting to see Dr. Harl Bowie. Will forward.

## 2015-03-13 NOTE — Telephone Encounter (Signed)
Ok, can continue to follow with pcp  Zandra Abts MD

## 2015-03-13 NOTE — Telephone Encounter (Signed)
[name] [name] calls stating that she was in [facility] approximately 3 months ago for shortness of breath and her tongue swelling. States that her BP is 223/88 at 7AM. Having a headache.

## 2015-03-13 NOTE — Telephone Encounter (Signed)
Offered pt afternoon appt but pt says after BP check with pcp, pt's bp monitor was not accurate and pcp added clonidine 0.1 mg daily and recheck in 2 weeks. Told pt may not be necessary to be seen. Will forward to Dr. Harl Bowie if agreeable.

## 2015-06-12 ENCOUNTER — Ambulatory Visit (INDEPENDENT_AMBULATORY_CARE_PROVIDER_SITE_OTHER): Payer: Medicare Other | Admitting: Cardiology

## 2015-06-12 ENCOUNTER — Encounter: Payer: Self-pay | Admitting: *Deleted

## 2015-06-12 ENCOUNTER — Encounter: Payer: Self-pay | Admitting: Cardiology

## 2015-06-12 VITALS — BP 188/72 | HR 61 | Ht 65.0 in | Wt 192.4 lb

## 2015-06-12 DIAGNOSIS — I10 Essential (primary) hypertension: Secondary | ICD-10-CM

## 2015-06-12 MED ORDER — CHLORTHALIDONE 50 MG PO TABS: 50.0000 mg | ORAL_TABLET | Freq: Every day | ORAL | Status: DC

## 2015-06-12 MED ORDER — FUROSEMIDE 40 MG PO TABS: ORAL_TABLET | ORAL | Status: DC

## 2015-06-12 MED ORDER — BUSPIRONE HCL 5 MG PO TABS: 2.5000 mg | ORAL_TABLET | Freq: Every day | ORAL | Status: DC

## 2015-06-12 NOTE — Patient Instructions (Addendum)
Your physician recommends that you schedule a follow-up appointment in: 2 WEEKS WITH DR. Bladen  Your physician has recommended you make the following change in your medication:   DECREASE LASIX 40 MG AS NEEDED FOR SWELLING  STOP LOSARTAN    STOP ZIAC   START BUSPIRONE 2.5 MG DAILY  START CHLORTHALIDONE 30 MG DAILY  Your physician has requested that you regularly monitor and record your blood pressure readings at home FOR 2 WEEKS AND BRING WITH YOU TO YOUR NEXT APPOINTMENT. Please use the same machine at the same time of day to check your readings and record them to bring to your follow-up visit.  Thank you for choosing [facility]!!

## 2015-06-12 NOTE — Progress Notes (Signed)
Patient ID: [name] [name], female   DOB: [birth year], 74 y.o.   MRN: [id number]     Clinical Summary [name] is a 74 y.o.female seen today for follow up of the following medical problems. This is a focused visit on her history of HTN and recent medication side effects. For more complete history please refer to prior notes.    1. HTN - history of angioedema on ACE-Is. Due to elevated bp's, recently started on losartan by pcp in June of this year per patient report. Over the last few days patient reports similar tongue swelling that she had when on lisinopril. She stopped taking losartan and symptoms resolved.  - she reports amlodopine caused leg swelling.  - home bp log 140s-160s/70s    Past Medical History  Diagnosis Date  . Diabetes mellitus   . 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 Peripheral vascular disease      status post iliac stent July 2001   . Hypertension   . Hypercholesterolemia      Allergies  Allergen Reactions  . Codeine Nausea And Vomiting  . Hydrocodone Nausea And Vomiting     Current Outpatient Prescriptions  Medication Sig Dispense Refill  . alendronate (FOSAMAX) 70 MG tablet Take 70 mg by mouth once a week. Take with a full glass of water on an empty stomach.    [name] amLODipine (NORVASC) 10 MG tablet TAKE (1) TABLET BY MOUTH ONCE DAILY. 30 tablet 3  . aspirin 81 MG tablet Take 81 mg by mouth daily.    . bisoprolol-hydrochlorothiazide (ZIAC) 2.5-6.25 MG per tablet Take 1 tablet by mouth daily.    . Calcium Carbonate-Vit D-Min (CALCIUM 1200 PO) Take by mouth.    . ENDOCET 10-325 MG per tablet     . furosemide (LASIX) 40 MG tablet TAKE 1 TABLET ONCE DAILY. MAY TAKE EXTRA DOSE IF NEEDED FOR SWELLING OR SHORTNESS OF BREATH. 45 tablet 6  . insulin glargine (LANTUS SOLOSTAR) 100 UNIT/ML injection Inject 65 Units into the skin at bedtime.     . insulin glulisine  (APIDRA) 100 UNIT/ML injection Inject 16 Units into the skin 3 (three) times daily before meals.     [name] lisinopril (PRINIVIL,ZESTRIL) 40 MG tablet TAKE (1) TABLET BY MOUTH ONCE DAILY. 30 tablet 6  . omeprazole (PRILOSEC) 20 MG capsule Take 20 mg by mouth 2 (two) times daily before a meal.     . ONE TOUCH ULTRA TEST test strip     . polyethylene glycol (MIRALAX / GLYCOLAX) packet Take 17 g by mouth daily.    . rosuvastatin (CRESTOR) 10 MG tablet Take 10 mg by mouth daily.    . SYMBICORT 160-4.5 MCG/ACT inhaler      No current facility-administered medications for this visit.     Past Surgical History  Procedure Laterality Date  . Cervical fusion    . Coronary stent placement       Allergies  Allergen Reactions  . Codeine Nausea And Vomiting  . Hydrocodone Nausea And Vomiting      Family History  Problem Relation Age of Onset  . Heart failure Father   . Heart failure Mother   . Diabetes Mother   . COPD Brother      Social History [name] reports that she quit smoking about 19 years ago. Her smoking use included Cigarettes. She started smoking about 53 years  ago. She has a 5 pack-year smoking history. She has never used smokeless tobacco. [name] reports that she does not drink alcohol.   Review of Systems CONSTITUTIONAL: No weight loss, fever, chills, weakness or fatigue.  HEENT: tongue swelling SKIN: No rash or itching.  CARDIOVASCULAR: per HPI RESPIRATORY: No shortness of breath, cough or sputum.  GASTROINTESTINAL: No anorexia, nausea, vomiting or diarrhea. No abdominal pain or blood.  GENITOURINARY: No burning on urination, no polyuria NEUROLOGICAL: No headache, dizziness, syncope, paralysis, ataxia, numbness or tingling in the extremities. No change in bowel or bladder control.  MUSCULOSKELETAL: No muscle, back pain, joint pain or stiffness.  LYMPHATICS: No enlarged nodes. No history of splenectomy.  PSYCHIATRIC: No history of depression or anxiety.    ENDOCRINOLOGIC: No reports of sweating, cold or heat intolerance. No polyuria or polydipsia.  [name]   Physical Examination Filed Vitals:   06/12/15 1009  BP: 188/72  Pulse: 61   Filed Vitals:   06/12/15 1009  Height: '5\' 5"'  (1.651 m)  Weight: 192 lb 6.4 oz (87.272 kg)    Gen: resting comfortably, no acute distress HEENT: no scleral icterus, pupils equal round and reactive, no palptable cervical adenopathy,  CV: RRR, no m/r/g, no JVD Resp: Clear to auscultation bilaterally GI: abdomen is soft, non-tender, non-distended, normal bowel sounds, no hepatosplenomegaly MSK: extremities are warm, no edema.  Skin: warm, no rash Neuro:  no focal deficits Psych: appropriate affect   Diagnostic Studies 11/1999 Cath HEMODYNAMIC DATA: Aortic pressure was initially extremely high at 215/92. She  was  given intravenous enalaprilat, intravenous labetalol, and intravenous  nitroglycerin, ultimately bringing her systolic blood pressure down to less  than  160. Left ventricular pressure was 180/17, with a corresponding aortic  pressure of  180/80. There was no aortic valve gradient.  LEFT VENTRICULOGRAM: Wall motion was normal. Ejection fraction was estimated  t  65%. No mitral regurgitation.  ABDOMINAL AORTOGRAM: This revealed patent renal arteries and abdominal aorta.  The  left common iliac artery had a 75% stenosis. The right external iliac artery  had a  40% stenosis.  CORONARY ARTERIOGRAPHY (Right dominant):  1. The left main was normal.  2. The left anterior descending artery had a 25% stenosis in the proximal to  mid  vessel. The distal LAD had a 50% stenosis just after the bifurcation of the  large second diagonal Marvelyn Bouchillon. Further down the distal LAD is a focal 80%  stenosis.  3. The left circumflex had a 20% stenosis in the mid vessel. It gave rise to a  small OM-1, a normal sized OM-2 which had a 25% stenosis, and a normal sized  OM-3 which had a 20%  stenosis.  4. The right coronary artery had a diffuse 40% stenosis extending from the mid  o  distal vessel. There was a large posterior descending artery which had a  25%,  followed by a 40% stenosis. There were two small posterolateral branches.  IMPRESSIONS:  1. Normal left ventricular systolic function.  2. Peripheral vascular disease, as described.  3. One-vessel coronary artery disease with significant stenosis in the distal  left  anterior descending artery. This appears to correspond with her EKG changes  and  Cardiolite scan. She has moderate, but non-flow limiting disease in the  right coronary and left circumflex.  PLAN: Percutaneous intervention of the distal LAD, see below.  PTCA PROCEDURAL NOTE: Following the completion of the diagnostic  catheterization,  we opted to proceed with percutaneous intervention. The preexisting 6-French  sheath in the right femoral artery was exchanged over a wire for a 7-French  sheath.  Integrilin and heparin were administered per protocol. We used a 7-French JL-4  guiding catheter and a BMW wire. The reference vessel was approximately 2 mm  in  diameter. We utilized a 2.0 x 20 mm CrossSail balloon, which was inflated  initially to 12 atm x 2 minutes and then 14 atm x 3 minutes. Final  angiographic  images revealed significant improvement in the lumen with 25% residual stenosis  and  a possible very small non-flow limiting dissection. There was TIMI-3 flow into  the  distal vessel.  COMPLICATIONS: None.  RESULTS: Successful percutaneous transluminal coronary angioplasty of the  distal  left anterior descending artery, reducing an 80% stenosis to 25% residual with  TIMI-3 flow.  PLAN: Integrilin will be continued for 20 hours. The patient needs aggressive  blood pressure control and risk factor reduction.  In regards to her peripheral vascular disease, she will be referred for further   evaluation and possible intervention.  03/2012 Echo LVEF 60-65%, no significant abnormalities    Assessment and Plan  1. HTN - difficult to control, partly due to medication side effects. Angioedema on ACE-I, appears to have had cross reactivity with ARB with similar symptoms. Losartan has been stopped. Norvasc caused leg swelling in the past - will stop her ziac, restart bisoprolol by itself at 2.32m daily. Start chlorthalidone 523mdaily for diuretic with more potent bp effect, change her lasix to 4066mrn only - she will keep bp log until f/u in 2 weeks. If continued high bp consider spironolactone in setting of resistant hypertenstion.     F/u  2-3 weeks  JonArnoldo Lenis.D.

## 2015-06-16 ENCOUNTER — Telehealth: Payer: Self-pay | Admitting: *Deleted

## 2015-06-16 MED ORDER — CHLORTHALIDONE 25 MG PO TABS: 25.0000 mg | ORAL_TABLET | Freq: Every day | ORAL | Status: DC

## 2015-06-16 NOTE — Telephone Encounter (Signed)
Pt confirmed that she is only taking lasix as needed not daily and she has started the chlorthalidone 50 mg daily since last 9/8 office visit. Pt will decrease chlorthalidone to 25 mg daily and will f/u in a few days to update on symptoms

## 2015-06-16 NOTE — Telephone Encounter (Signed)
Medication change may be causing symptoms. Please varify she started the chlorthalidone 50mg  daily and that she changed her lasix to only as needed. Please see how often she has been taking her lasix. I would decrease her chlorthalidone to 25mg  daily and see how the symptoms are.    Zandra Abts MD

## 2015-06-16 NOTE — Telephone Encounter (Signed)
Pt says she is cramping in arms/legs and fingers/toes, and unable to sleep. Would like to know if recent medication change could be causing symptoms. Will forward to Dr. Harl Bowie

## 2015-06-27 ENCOUNTER — Telehealth: Payer: Self-pay | Admitting: *Deleted

## 2015-06-27 NOTE — Telephone Encounter (Signed)
Pt says symptoms have improved, BP has been ranging from 120s/140s over 60s/70s. Pt confirmed 10/3 appt with Dr. Harl Bowie and will bring BP readings. Will forward to Dr. Harl Bowie as Juluis Rainier

## 2015-06-30 NOTE — Telephone Encounter (Signed)
Sounds good  [name] Abts MD

## 2015-07-07 ENCOUNTER — Encounter: Payer: Self-pay | Admitting: Cardiology

## 2015-07-07 ENCOUNTER — Ambulatory Visit (INDEPENDENT_AMBULATORY_CARE_PROVIDER_SITE_OTHER): Payer: Medicare Other | Admitting: Cardiology

## 2015-07-07 VITALS — BP 182/70 | HR 67 | Ht 65.0 in | Wt 188.0 lb

## 2015-07-07 DIAGNOSIS — I10 Essential (primary) hypertension: Secondary | ICD-10-CM | POA: Diagnosis not present

## 2015-07-07 NOTE — Progress Notes (Signed)
Patient ID: [name] [name], female   DOB: [birth year], 74 y.o.   MRN: [id number]     Clinical Summary [name] [name] is a 74 y.o.female seen today for follow up of the following medical problems. This is a focused visit on her history of difficult to control HTN. For more detailed history please refer to prior notes.   1. HTN - history of angioedema on ACE-I and most recently on ARB.  - she reports amlodopine caused leg swelling.  - last visit started chlrothalidone 67m daily. She had some muscle cramping, and the dose was decreased to 255mdaily which resolved these symptoms. - bp log 120-140s/50-70    Past Medical History  Diagnosis Date  . Diabetes mellitus   . 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 Peripheral vascular disease      status post iliac stent July 2001   . Hypertension   . Hypercholesterolemia      Allergies  Allergen Reactions  . Lisinopril Other (See Comments)    Tongue swelling  . Losartan     TONGUE SWELLING  . Codeine Nausea And Vomiting  . Hydrocodone Nausea And Vomiting     Current Outpatient Prescriptions  Medication Sig Dispense Refill  . aspirin 81 MG tablet Take 81 mg by mouth daily.    . betamethasone dipropionate (DIPROLENE) 0.05 % cream Apply 1 application topically 2 (two) times daily.    . busPIRone (BUSPAR) 5 MG tablet Take 0.5 tablets (2.5 mg total) by mouth daily. 60 tablet 3  . Calcium Carbonate-Vit D-Min (CALCIUM 1200 PO) Take by mouth.    . chlorthalidone (HYGROTON) 25 MG tablet Take 1 tablet (25 mg total) by mouth daily. 90 tablet 3  . Cholecalciferol (VITAMIN D3) 2000 UNITS TABS Take 1 tablet by mouth 2 (two) times daily.    . cloNIDine (CATAPRES) 0.1 MG tablet Take 0.1 mg by mouth 2 (two) times daily.    . diclofenac (VOLTAREN) 75 MG EC tablet Take 75 mg by mouth daily.    . ENDOCET 10-325 MG per tablet Take 1 tablet by mouth 2  (two) times daily.     . furosemide (LASIX) 40 MG tablet TAKE 1 TAB DAILY AS NEEDED FOR SWELLING 90 tablet 3  . gemfibrozil (LOPID) 600 MG tablet Take 600 mg by mouth 2 (two) times daily.    . insulin glargine (LANTUS SOLOSTAR) 100 UNIT/ML injection Inject 60 Units into the skin every morning.     . insulin lispro (HUMALOG) 100 UNIT/ML injection Inject 30 Units into the skin every evening.    . Marland Kitchenlopatadine (PATANOL) 0.1 % ophthalmic solution Place 1 drop into both eyes daily.    . Marland Kitchenmeprazole (PRILOSEC) 20 MG capsule Take 20 mg by mouth 2 (two) times daily before a meal.     . ONE TOUCH ULTRA TEST test strip     . polyethylene glycol (MIRALAX / GLYCOLAX) packet Take 17 g by mouth daily.    . rosuvastatin (CRESTOR) 10 MG tablet Take 10 mg by mouth daily.    . SYMBICORT 160-4.5 MCG/ACT inhaler Inhale 2 puffs into the lungs 2 (two) times daily.      No current facility-administered medications for this visit.     Past Surgical History  Procedure Laterality Date  . Cervical fusion    . Coronary stent placement       Allergies  Allergen Reactions  .  Lisinopril Other (See Comments)    Tongue swelling  . Losartan     TONGUE SWELLING  . Codeine Nausea And Vomiting  . Hydrocodone Nausea And Vomiting      Family History  Problem Relation Age of Onset  . Heart failure Father   . Heart failure Mother   . Diabetes Mother   . COPD Brother      Social History Ms. Cragle reports that she quit smoking about 19 years ago. Her smoking use included Cigarettes. She started smoking about 53 years ago. She has a 5 pack-year smoking history. She has never used smokeless tobacco. Ms. TALIERCIO reports that she does not drink alcohol.   Review of Systems CONSTITUTIONAL: No weight loss, fever, chills, weakness or fatigue.  HEENT: Eyes: No visual loss, blurred vision, double vision or yellow sclerae.No hearing loss, sneezing, congestion, runny nose or sore throat.  SKIN: No rash or itching.    CARDIOVASCULAR: per HPI RESPIRATORY: No shortness of breath, cough or sputum.  GASTROINTESTINAL: No anorexia, nausea, vomiting or diarrhea. No abdominal pain or blood.  GENITOURINARY: No burning on urination, no polyuria NEUROLOGICAL: No headache, dizziness, syncope, paralysis, ataxia, numbness or tingling in the extremities. No change in bowel or bladder control.  MUSCULOSKELETAL: No muscle, back pain, joint pain or stiffness.  LYMPHATICS: No enlarged nodes. No history of splenectomy.  PSYCHIATRIC: No history of depression or anxiety.  ENDOCRINOLOGIC: No reports of sweating, cold or heat intolerance. No polyuria or polydipsia.  [name]   Physical Examination Filed Vitals:   07/07/15 1430  BP: 182/70  Pulse: 67   Filed Vitals:   07/07/15 1336  Height: '5\' 5"'  (1.651 m)  Weight: 188 lb (85.276 kg)   Manual bp 150/75  Gen: resting comfortably, no acute distress HEENT: no scleral icterus, pupils equal round and reactive, no palptable cervical adenopathy,  CV: RRR, no m/r/g, no JVD Resp: Clear to auscultation bilaterally GI: abdomen is soft, non-tender, non-distended, normal bowel sounds, no hepatosplenomegaly MSK: extremities are warm, no edema.  Skin: warm, no rash Neuro:  no focal deficits Psych: appropriate affect   Diagnostic Studies 11/1999 Cath HEMODYNAMIC DATA: Aortic pressure was initially extremely high at 215/92. She  was  given intravenous enalaprilat, intravenous labetalol, and intravenous  nitroglycerin, ultimately bringing her systolic blood pressure down to less  than  160. Left ventricular pressure was 180/17, with a corresponding aortic  pressure of  180/80. There was no aortic valve gradient.  LEFT VENTRICULOGRAM: Wall motion was normal. Ejection fraction was estimated  t  65%. No mitral regurgitation.  ABDOMINAL AORTOGRAM: This revealed patent renal arteries and abdominal aorta.  The  left common iliac artery had a 75% stenosis. The right  external iliac artery  had a  40% stenosis.  CORONARY ARTERIOGRAPHY (Right dominant):  1. The left main was normal.  2. The left anterior descending artery had a 25% stenosis in the proximal to  mid  vessel. The distal LAD had a 50% stenosis just after the bifurcation of the  large second diagonal Kerriann Kamphuis. Further down the distal LAD is a focal 80%  stenosis.  3. The left circumflex had a 20% stenosis in the mid vessel. It gave rise to a  small OM-1, a normal sized OM-2 which had a 25% stenosis, and a normal sized  OM-3 which had a 20% stenosis.  4. The right coronary artery had a diffuse 40% stenosis extending from the mid  o  distal vessel. There was a large posterior descending  artery which had a  25%,  followed by a 40% stenosis. There were two small posterolateral branches.  IMPRESSIONS:  1. Normal left ventricular systolic function.  2. Peripheral vascular disease, as described.  3. One-vessel coronary artery disease with significant stenosis in the distal  left  anterior descending artery. This appears to correspond with her EKG changes  and  Cardiolite scan. She has moderate, but non-flow limiting disease in the  right coronary and left circumflex.  PLAN: Percutaneous intervention of the distal LAD, see below.  PTCA PROCEDURAL NOTE: Following the completion of the diagnostic  catheterization,  we opted to proceed with percutaneous intervention. The preexisting 6-French  sheath in the right femoral artery was exchanged over a wire for a 7-French  sheath.  Integrilin and heparin were administered per protocol. We used a 7-French JL-4  guiding catheter and a BMW wire. The reference vessel was approximately 2 mm  in  diameter. We utilized a 2.0 x 20 mm CrossSail balloon, which was inflated  initially to 12 atm x 2 minutes and then 14 atm x 3 minutes. Final  angiographic  images revealed significant improvement in the lumen with 25%  residual stenosis  and  a possible very small non-flow limiting dissection. There was TIMI-3 flow into  the  distal vessel.  COMPLICATIONS: None.  RESULTS: Successful percutaneous transluminal coronary angioplasty of the  distal  left anterior descending artery, reducing an 80% stenosis to 25% residual with  TIMI-3 flow.  PLAN: Integrilin will be continued for 20 hours. The patient needs aggressive  blood pressure control and risk factor reduction.  In regards to her peripheral vascular disease, she will be referred for further  evaluation and possible intervention.  03/2012 Echo LVEF 60-65%, no significant abnormalities     Assessment and Plan  1. HTN - difficult to control, partly due to medication side effects. Angioedema on ACE-I, appears to have had cross reactivity with ARB with similar symptoms. Losartan has been stopped. Norvasc caused leg swelling in the past.She has white coat HTN as well. Home numbers are at goal despite being elevated in clinic today - continue current meds, check BMET and Mg since starting chlorthalidone.     F/u 6 months   [name], M.D.

## 2015-07-07 NOTE — Patient Instructions (Signed)
   Labs for BMET, Magnesium - orders given today.  Office will contact with results via phone or letter.   Continue all current medications. Your physician wants you to follow up in: 6 months.  You will receive a reminder letter in the mail one-two months in advance.  If you don't receive a letter, please call our office to schedule the follow up appointment

## 2015-07-09 ENCOUNTER — Telehealth: Payer: Self-pay | Admitting: Cardiology

## 2015-07-09 NOTE — Telephone Encounter (Signed)
Patient would like to know results from lab work

## 2015-07-09 NOTE — Telephone Encounter (Signed)
Pt aware of normal results.

## 2015-07-09 NOTE — Telephone Encounter (Signed)
Labs scanned into chart, will forward to Dr. Harl Bowie

## 2015-07-11 ENCOUNTER — Other Ambulatory Visit: Payer: Self-pay | Admitting: Cardiology

## 2015-10-07 LAB — HEMOGLOBIN A1C: Hemoglobin A1C: 8.8

## 2016-01-26 LAB — HEMOGLOBIN A1C: Hemoglobin A1C: 8.9

## 2016-02-13 ENCOUNTER — Ambulatory Visit (INDEPENDENT_AMBULATORY_CARE_PROVIDER_SITE_OTHER): Payer: Medicare Other | Admitting: "Endocrinology

## 2016-02-13 ENCOUNTER — Encounter: Payer: Medicare Other | Attending: "Endocrinology | Admitting: Nutrition

## 2016-02-13 ENCOUNTER — Encounter: Payer: Self-pay | Admitting: Nutrition

## 2016-02-13 ENCOUNTER — Encounter: Payer: Self-pay | Admitting: "Endocrinology

## 2016-02-13 VITALS — BP 174/80 | HR 71 | Ht 65.0 in | Wt 178.0 lb

## 2016-02-13 VITALS — Ht 65.0 in | Wt 178.0 lb

## 2016-02-13 DIAGNOSIS — E1169 Type 2 diabetes mellitus with other specified complication: Secondary | ICD-10-CM | POA: Insufficient documentation

## 2016-02-13 DIAGNOSIS — E785 Hyperlipidemia, unspecified: Secondary | ICD-10-CM | POA: Diagnosis not present

## 2016-02-13 DIAGNOSIS — E1159 Type 2 diabetes mellitus with other circulatory complications: Secondary | ICD-10-CM | POA: Diagnosis not present

## 2016-02-13 DIAGNOSIS — I10 Essential (primary) hypertension: Secondary | ICD-10-CM | POA: Diagnosis not present

## 2016-02-13 DIAGNOSIS — IMO0002 Reserved for concepts with insufficient information to code with codable children: Secondary | ICD-10-CM

## 2016-02-13 DIAGNOSIS — Z794 Long term (current) use of insulin: Secondary | ICD-10-CM

## 2016-02-13 DIAGNOSIS — E118 Type 2 diabetes mellitus with unspecified complications: Secondary | ICD-10-CM

## 2016-02-13 DIAGNOSIS — E669 Obesity, unspecified: Secondary | ICD-10-CM

## 2016-02-13 DIAGNOSIS — E1165 Type 2 diabetes mellitus with hyperglycemia: Secondary | ICD-10-CM

## 2016-02-13 MED ORDER — INSULIN GLARGINE 100 UNIT/ML ~~LOC~~ SOLN: 40.0000 [IU] | Freq: Every day | SUBCUTANEOUS | Status: DC

## 2016-02-13 NOTE — Progress Notes (Signed)
  Medical Nutrition Therapy:  Appt start time: 0900 end time:  1000.  Assessment:  Primary concerns today: Diabetes  Type 2.  A1C .8.9%. Lives by herself.  Here to see Dr. Dorris Fetch today. Eats out some and cooks meals are baked and some fried. Eat three meals per day. 40 units of Lantus and starting 10 units of Humalog with meals. Sees Dr. Woody Seller for PCP and Dr. Dorris Fetch for her Endocrinologist. Checks blood sugars 3 times per day. Physical activity/ walks. Has lost about 5 lbs in the last month. She has cut back on her foods and cut down on diet tea. Diet needs more consistent carb intake and more fresh fruits and vegetables. Needs to drink water and cut out diet tea.   No preference indicated   Learning Readiness:   Ready  Change in progress   MEDICATIONS: See list   DIETARY INTAKE:    24-hr recall:  B ( AM):  Rice Krispies or Cherrios  2% milk  1 cup, sometimes Sunny  delight Snk ( AM): Pb crackers. Diet tea L ( PM):  Hamburger with onions, tomato with bun,  Diet Tea Snk ( PM):  Gram crackers D ( PM): Green beans, creamed potatoes, 1 slice bread, peaches, Diet tea Snk ( PM): none Beverages:  Diet tea  Usual physical activity: See.  Estimated energy needs: 1500 calories 170 g carbohydrates 112 g protein 42 g fat  Progress Towards Goal(s):  In progress.   Nutritional Diagnosis:  NB-1.1 Food and nutrition-related knowledge deficit As related to Diabetes.  As evidenced by A1C 8.9%.    Intervention:  Nutrition and Diabetes education provided on My Plate, CHO counting, meal planning, portion sizes, timing of meals, avoiding snacks between meals unless having a low blood sugar, target ranges for A1C and blood sugars, signs/symptoms and treatment of hyper/hypoglycemia, monitoring blood sugars, taking medications as prescribed, benefits of exercising 30 minutes per day and prevention of complications of DM.  Goals 1. Follow Plate MEthod 2. Eat thee meals at times discussed. 3. Cut out  snacks 4. Drink only water 5. Increase fresh fruits and vegetables. 6. Walk 30 minutes a day 7. Get A1C down to 7%    Teaching Method Utilized:  Visual Auditory Hands on  Handouts given during visit include:  The Plate Method  Meal Plan   Diabetes Instructions.   Barriers to learning/adherence to lifestyle change:  None  Demonstrated degree of understanding via:  Teach Back   Monitoring/Evaluation:  Dietary intake, exercise, meal planning, SBG, and body weight in 1 week.[name]

## 2016-02-13 NOTE — Patient Instructions (Signed)
Goals 1. Follow Plate MEthod 2. Eat thee meals at times discussed. 3. Cut out snacks 4. Drink only water 5. Increase fresh fruits and vegetables. 6. Walk 30 minutes a day 7. Get A1C down to 7%

## 2016-02-13 NOTE — Patient Instructions (Signed)

## 2016-02-13 NOTE — Progress Notes (Signed)
Subjective:    Patient ID: [name] [name], female    DOB: June 17, 1941. Patient is being seen in consultation for management of diabetes requested by  Glenda Chroman, MD  Past Medical History  Diagnosis Date  . Diabetes mellitus   . 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 Peripheral vascular disease ([place])      status post iliac stent July 2001   . Hypertension   . Hypercholesterolemia    Past Surgical History  Procedure Laterality Date  . Cervical fusion    . Coronary stent placement     Social History   Social History  . Marital Status: Widowed    Spouse Name: N/A  . Number of Children: N/A  . Years of Education: N/A   Social History Main Topics  . Smoking status: Former Smoker -- 0.50 packs/day for 10 years    Types: Cigarettes    Start date: 11/30/1961    Quit date: 10/05/1995  . Smokeless tobacco: Never Used  . Alcohol Use: No  . Drug Use: No  . Sexual Activity: No   Other Topics Concern  . None   Social History Narrative   Outpatient Encounter Prescriptions as of 02/13/2016  Medication Sig  . aspirin 81 MG tablet Take 81 mg by mouth daily.  . betamethasone dipropionate (DIPROLENE) 0.05 % cream Apply 1 application topically 2 (two) times daily.  . busPIRone (BUSPAR) 5 MG tablet Take 0.5 tablets (2.5 mg total) by mouth daily.  . Calcium Carbonate-Vit D-Min (CALCIUM 1200 PO) Take by mouth.  . chlorthalidone (HYGROTON) 25 MG tablet TAKE 2 TABLETS BY MOUTH ONCE DAILY.  [name] insulin glargine (LANTUS) 100 UNIT/ML injection Inject 0.4 mLs (40 Units total) into the skin at bedtime.  . SYMBICORT 160-4.5 MCG/ACT inhaler Inhale 2 puffs into the lungs 2 (two) times daily.   . [DISCONTINUED] chlorthalidone (HYGROTON) 25 MG tablet Take 1 tablet (25 mg total) by mouth daily.  . [DISCONTINUED] insulin glargine (LANTUS SOLOSTAR) 100 UNIT/ML injection Inject 40 Units into the skin  at bedtime.  . [DISCONTINUED] insulin glulisine (APIDRA) 100 UNIT/ML injection Inject 10-16 Units into the skin 3 (three) times daily with meals.  . Cholecalciferol (VITAMIN D3) 2000 UNITS TABS Take 1 tablet by mouth 2 (two) times daily.  . diclofenac (VOLTAREN) 75 MG EC tablet Take 75 mg by mouth daily.  . ENDOCET 10-325 MG per tablet Take 1 tablet by mouth 2 (two) times daily.   . furosemide (LASIX) 40 MG tablet TAKE 1 TAB DAILY AS NEEDED FOR SWELLING  . gemfibrozil (LOPID) 600 MG tablet Take 600 mg by mouth 2 (two) times daily.  . insulin lispro (HUMALOG) 100 UNIT/ML injection Inject 10-16 Units into the skin 3 (three) times daily with meals.  [name] omeprazole (PRILOSEC) 20 MG capsule Take 20 mg by mouth 2 (two) times daily before a meal.   . ONE TOUCH ULTRA TEST test strip   . polyethylene glycol (MIRALAX / GLYCOLAX) packet Take 17 g by mouth daily.  . Potassium 99 MG TABS Take 1 tablet by mouth daily.  . rosuvastatin (CRESTOR) 10 MG tablet Take 10 mg by mouth daily.  . [DISCONTINUED] cloNIDine (CATAPRES) 0.1 MG tablet Take 0.1 mg by mouth 2 (two) times daily.   No facility-administered encounter medications on file as of 02/13/2016.   ALLERGIES: Allergies  Allergen Reactions  . Lisinopril Other (See  Comments)    Tongue swelling  . Losartan     TONGUE SWELLING  . Codeine Nausea And Vomiting  . Hydrocodone Nausea And Vomiting   VACCINATION STATUS:  There is no immunization history on file for this patient.  Diabetes She presents for her initial diabetic visit. Onset time: She was diagnosed at approximate age of 63 years. Her disease course has been worsening. There are no hypoglycemic associated symptoms. Pertinent negatives for hypoglycemia include no confusion, headaches, pallor or seizures. Associated symptoms include fatigue, polydipsia and polyuria. Pertinent negatives for diabetes include no chest pain and no polyphagia. There are no hypoglycemic complications. Symptoms are  worsening. Diabetic complications include heart disease. Risk factors for coronary artery disease include diabetes mellitus, dyslipidemia, hypertension and sedentary lifestyle. Current diabetic treatment includes insulin injections (She did 85 units of Lantus a day and 45 units of Humalog, 15 units 3 times a day with meals.). Her weight is stable. She is following a generally unhealthy diet. When asked about meal planning, she reported none. She has not had a previous visit with a dietitian. She never participates in exercise. Home blood sugar record trend: She did not bring the meter nor log to review today. An ACE inhibitor/angiotensin II receptor blocker is contraindicated.  Hyperlipidemia This is a chronic problem. The current episode started more than 1 year ago. Pertinent negatives include no chest pain, myalgias or shortness of breath. Current antihyperlipidemic treatment includes statins. Risk factors for coronary artery disease include dyslipidemia, diabetes mellitus, hypertension and a sedentary lifestyle.  Hypertension This is a chronic problem. The current episode started more than 1 year ago. Pertinent negatives include no chest pain, headaches, palpitations or shortness of breath. Risk factors for coronary artery disease include diabetes mellitus, dyslipidemia and sedentary lifestyle. Past treatments include diuretics. Hypertensive end-organ damage includes CAD/MI.       Review of Systems  Constitutional: Positive for fatigue. Negative for fever, chills and unexpected weight change.  HENT: Negative for trouble swallowing and voice change.   Eyes: Negative for visual disturbance.  Respiratory: Negative for cough, shortness of breath and wheezing.   Cardiovascular: Negative for chest pain, palpitations and leg swelling.  Gastrointestinal: Negative for nausea, vomiting and diarrhea.  Endocrine: Positive for polydipsia and polyuria. Negative for cold intolerance, heat intolerance and  polyphagia.  Musculoskeletal: Negative for myalgias and arthralgias.  Skin: Negative for color change, pallor, rash and wound.  Neurological: Negative for seizures and headaches.  Psychiatric/Behavioral: Negative for suicidal ideas and confusion.    Objective:    BP 174/80 mmHg  Pulse 71  Ht _0  (1.651 m)  Wt 178 lb (80.74 kg)  BMI 29.62 kg/m2  SpO2 98%  Wt Readings from Last 3 Encounters:  02/13/16 178 lb (80.74 kg)  07/07/15 188 lb (85.276 kg)  06/12/15 192 lb 6.4 oz (87.272 kg)    Physical Exam  Constitutional: She is oriented to person, place, and time. She appears well-developed.  HENT:  Head: Normocephalic and atraumatic.  Eyes: EOM are normal.  Neck: Normal range of motion. Neck supple. No tracheal deviation present. No thyromegaly present.  Cardiovascular: Normal rate and regular rhythm.   Pulmonary/Chest: Effort normal and breath sounds normal.  Abdominal: Soft. Bowel sounds are normal. There is no tenderness. There is no guarding.  Musculoskeletal: Normal range of motion. She exhibits no edema.  Neurological: She is alert and oriented to person, place, and time. She has normal reflexes. No cranial nerve deficit. Coordination normal.  Skin: Skin is warm  and dry. No rash noted. No erythema. No pallor.  Psychiatric: She has a normal mood and affect. Judgment normal.     CMP ( most recent) CMP     Component Value Date/Time   NA 137 08/28/2008 1100   K 4.0 08/28/2008 1100   CL 93* 08/28/2008 1100   CO2 32 08/28/2008 1100   GLUCOSE 329* 08/28/2008 1100   BUN 18 08/28/2008 1100   CREATININE 0.88 08/28/2008 1100   CALCIUM 9.7 08/28/2008 1100   GFRNONAA >60 08/28/2008 1100   GFRAA  08/28/2008 1100    >60        The eGFR has been calculated using the MDRD equation. This calculation has not been validated in all clinical          Assessment & Plan:   1. DM type 2 causing vascular disease ([place])  - Patient has currently uncontrolled symptomatic type 2  DM since  75 years of age,  with most recent A1c of 8.9 %. Recent labs reviewed.   Her diabetes is complicated by coronary artery disease which required stent placement in 1997 and patient remains at a high risk for more acute and chronic complications of diabetes which include CAD, CVA, CKD, retinopathy, and neuropathy. These are all discussed in detail with the patient.  - I have counseled the patient on diet management and weight loss, by adopting a carbohydrate restricted/protein rich diet.  - Suggestion is made for patient to avoid simple carbohydrates   from their diet including Cakes , Desserts, Ice Cream,  Soda (  diet and regular) , Sweet Tea , Candies,  Chips, Cookies, Artificial Sweeteners,   and "Sugar-free" Products . This will help patient to have stable blood glucose profile and potentially avoid unintended weight gain.  - I encouraged the patient to switch to  unprocessed or minimally processed complex starch and increased protein intake (animal or plant source), fruits, and vegetables.  - Patient is advised to stick to a routine mealtimes to eat 3 meals  a day and avoid unnecessary snacks ( to snack only to correct hypoglycemia).  - The patient will be scheduled with Jearld Fenton, RDN, CDE for individualized DM education.  - I have approached patient with the following individualized plan to manage diabetes and patient agrees:   - I  will proceed to readjust her basal insulin Lantus to 40 units QHS, and prandial insulin Humalog 10 units TIDAC for pre-meal BG readings of 90-'150mg'$ /dl, plus patient specific correction dose for unexpected hyperglycemia above '150mg'$ /dl, associated with strict monitoring of glucose  AC and HS. - Patient is warned not to take insulin without proper monitoring per orders. -Adjustment parameters are given for hypo and hyperglycemia in writing. -Patient is encouraged to call clinic for blood glucose levels less than 70 or above 300 mg /dl. -She does not  tolerate metformin. -She will be considered for SGLT2 inhibitors next visit.  -  She will also be considered for incretin therapy as appropriate next visit. - Patient specific target  A1c;  LDL, HDL, Triglycerides, and  Waist Circumference were discussed in detail.  2) BP/HTN: Uncontrolled. I advised her to take her outpatient medications in the morning. Continue current medications including  chlorthalidone and Lasix, she is allergic to ACE inhibitors. 3) Lipids/HPL:  Controlled unknown,  continue statins. 4)  Weight/Diet: CDE Consult will be initiated , exercise, and detailed carbohydrates information provided.  5) Chronic Care/Health Maintenance:  -Patient does not tolerate  ACEI,  she is on  statin medications and encouraged to continue to follow up with Ophthalmology, Podiatrist at least yearly or according to recommendations, and advised to   stay away from smoking. I have recommended yearly flu vaccine and pneumonia vaccination at least every 5 years; moderate intensity exercise for up to 150 minutes weekly; and  sleep for at least 7 hours a day.  - 60 minutes of time was spent on the care of this patient , 50% of which was applied for counseling on diabetes complications and their preventions.  - Patient to bring meter and  blood glucose logs during their next visit.   - I advised patient to maintain close follow up with Glenda Chroman, MD for primary care needs.  Follow up plan: - Return in about 1 week (around 02/20/2016) for diabetes, high blood pressure, high cholesterol, follow up with meter and logs- no labs.  Glade Lloyd, MD Phone: [phone]  Fax: [phone]   02/13/2016, 9:14 AM

## 2016-02-17 ENCOUNTER — Other Ambulatory Visit: Payer: Self-pay | Admitting: *Deleted

## 2016-02-17 MED ORDER — BUSPIRONE HCL 5 MG PO TABS: 2.5000 mg | ORAL_TABLET | Freq: Every day | ORAL | Status: DC

## 2016-02-19 ENCOUNTER — Ambulatory Visit (INDEPENDENT_AMBULATORY_CARE_PROVIDER_SITE_OTHER): Payer: Medicare Other | Admitting: Cardiology

## 2016-02-19 ENCOUNTER — Encounter: Payer: Self-pay | Admitting: Cardiology

## 2016-02-19 VITALS — BP 168/76 | HR 58 | Ht 65.0 in | Wt 173.0 lb

## 2016-02-19 DIAGNOSIS — E7800 Pure hypercholesterolemia, unspecified: Secondary | ICD-10-CM

## 2016-02-19 DIAGNOSIS — I739 Peripheral vascular disease, unspecified: Secondary | ICD-10-CM | POA: Diagnosis not present

## 2016-02-19 DIAGNOSIS — I10 Essential (primary) hypertension: Secondary | ICD-10-CM

## 2016-02-19 DIAGNOSIS — I2510 Atherosclerotic heart disease of native coronary artery without angina pectoris: Secondary | ICD-10-CM | POA: Diagnosis not present

## 2016-02-19 MED ORDER — HYDRALAZINE HCL 25 MG PO TABS
25.0000 mg | ORAL_TABLET | Freq: Two times a day (BID) | ORAL | Status: DC
Start: 2016-02-19 — End: 2016-02-19

## 2016-02-19 MED ORDER — HYDRALAZINE HCL 25 MG PO TABS: 25.0000 mg | ORAL_TABLET | Freq: Two times a day (BID) | ORAL | Status: DC

## 2016-02-19 NOTE — Patient Instructions (Signed)
Your physician wants you to follow-up in: [place] DR. BRANCH You will receive a reminder letter in the mail two months in advance. If you don't receive a letter, please call our office to schedule the follow-up appointment.  Your physician has recommended you make the following change in your medication:   START HYDRALAZINE 25 [place]  Your physician has requested that you regularly monitor and record your blood pressure readings at home FOR 2 [place]. Please use the same machine at the same time of day to check your readings and record them to bring to your follow-up visit.  Thank you for choosing [facility]!!

## 2016-02-19 NOTE — Progress Notes (Addendum)
Patient ID: [name] [name], female   DOB: 1940-12-03, 75 y.o.   MRN: [id number]     Clinical Summary [name] [name] is a 75 y.o.female seen today for follow up of the following medical problems.   1. HTN - history of angioedema on ACE-I and most recently on ARB.  - she reports amlodopine caused leg swelling. Clonidine caused dizziness.  - we hadstarted chlrothalidone '50mg'$  daily. She had some muscle cramping, and the dose was decreased to '25mg'$  daily which resolved these symptoms. - compliant with meds   2. CAD - prior PTCA to distal LAD in 2001. 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  - echo 06/2012 LVEF 60-65% - DSE 06/2012 with reported ST elevation in inferior leads, negative echo images for ischemia.   - since last visit denies any chest pain, SOB, DOE - compliant with meds  3. PAD - prior iliac stent in July 2001 - [place] 06/2012 Right ABI 0.99 Left ABI 0.99, with patent left iliac stent.  - denies any claudication like pain, denies any foot sores or ulcers  4. Hyperlipidemia - compliant with crestor - lipids checked regularly by pcp     SH: 2 children, 4 grandchildren.   Past Medical History  Diagnosis Date  . Diabetes mellitus   . 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 Peripheral vascular disease ([place])      status post iliac stent July 2001   . Hypertension   . Hypercholesterolemia      Allergies  Allergen Reactions  . Lisinopril Other (See Comments)    Tongue swelling  . Losartan     TONGUE SWELLING  . Codeine Nausea And Vomiting  . Hydrocodone Nausea And Vomiting     Current Outpatient Prescriptions  Medication Sig Dispense Refill  . aspirin 81 MG tablet Take 81 mg by mouth daily.    . betamethasone dipropionate (DIPROLENE) 0.05 % cream Apply 1 application topically 2 (two) times daily.    .  busPIRone (BUSPAR) 5 MG tablet Take 0.5 tablets (2.5 mg total) by mouth daily. 15 tablet 3  . Calcium Carbonate-Vit D-Min (CALCIUM 1200 PO) Take by mouth.    . chlorthalidone (HYGROTON) 25 MG tablet TAKE 2 TABLETS BY MOUTH ONCE DAILY. 60 tablet 3  . Cholecalciferol (VITAMIN D3) 2000 UNITS TABS Take 1 tablet by mouth 2 (two) times daily.    . diclofenac (VOLTAREN) 75 MG EC tablet Take 75 mg by mouth daily.    . ENDOCET 10-325 MG per tablet Take 1 tablet by mouth 2 (two) times daily.     . furosemide (LASIX) 40 MG tablet TAKE 1 TAB DAILY AS NEEDED FOR SWELLING 90 tablet 3  . gemfibrozil (LOPID) 600 MG tablet Take 600 mg by mouth 2 (two) times daily.    . insulin glargine (LANTUS) 100 UNIT/ML injection Inject 0.4 mLs (40 Units total) into the skin at bedtime. 15 mL 2  . insulin lispro (HUMALOG) 100 UNIT/ML injection Inject 10-16 Units into the skin 3 (three) times daily with meals.    [name] omeprazole (PRILOSEC) 20 MG capsule Take 20 mg by mouth 2 (two) times daily before a meal.     . ONE TOUCH ULTRA TEST test strip     . polyethylene glycol (MIRALAX / GLYCOLAX) packet Take 17 g by mouth daily.    . Potassium 99  MG TABS Take 1 tablet by mouth daily.    . rosuvastatin (CRESTOR) 10 MG tablet Take 10 mg by mouth daily.    . SYMBICORT 160-4.5 MCG/ACT inhaler Inhale 2 puffs into the lungs 2 (two) times daily.      No current facility-administered medications for this visit.     Past Surgical History  Procedure Laterality Date  . Cervical fusion    . Coronary stent placement       Allergies  Allergen Reactions  . Lisinopril Other (See Comments)    Tongue swelling  . Losartan     TONGUE SWELLING  . Codeine Nausea And Vomiting  . Hydrocodone Nausea And Vomiting      Family History  Problem Relation Age of Onset  . Heart failure Father   . Heart failure Mother   . Diabetes Mother   . COPD Brother      Social History [name] [name] reports that she quit smoking about 20 years ago. Her  smoking use included Cigarettes. She started smoking about 54 years ago. She has a 5 pack-year smoking history. She has never used smokeless tobacco. [name] [name] reports that she does not drink alcohol.   Review of Systems CONSTITUTIONAL: No weight loss, fever, chills, weakness or fatigue.  HEENT: Eyes: No visual loss, blurred vision, double vision or yellow sclerae.No hearing loss, sneezing, congestion, runny nose or sore throat.  SKIN: No rash or itching.  CARDIOVASCULAR: per HPI RESPIRATORY: No shortness of breath, cough or sputum.  GASTROINTESTINAL: No anorexia, nausea, vomiting or diarrhea. No abdominal pain or blood.  GENITOURINARY: No burning on urination, no polyuria NEUROLOGICAL: No headache, dizziness, syncope, paralysis, ataxia, numbness or tingling in the extremities. No change in bowel or bladder control.  MUSCULOSKELETAL: No muscle, back pain, joint pain or stiffness.  LYMPHATICS: No enlarged nodes. No history of splenectomy.  PSYCHIATRIC: No history of depression or anxiety.  ENDOCRINOLOGIC: No reports of sweating, cold or heat intolerance. No polyuria or polydipsia.  [name]   Physical Examination Filed Vitals:   02/19/16 1058  BP: 168/76  Pulse: 58   Filed Vitals:   02/19/16 1058  Height: '5\' 5"'$  (1.651 m)  Weight: 173 lb (78.472 kg)    Gen: resting comfortably, no acute distress HEENT: no scleral icterus, pupils equal round and reactive, no palptable cervical adenopathy,  CV: RRR, no m/r/g, no jvd Resp: Clear to auscultation bilaterally GI: abdomen is soft, non-tender, non-distended, normal bowel sounds, no hepatosplenomegaly MSK: extremities are warm, no edema.  Skin: warm, no rash Neuro:  no focal deficits Psych: appropriate affect   Diagnostic Studies 11/1999 Cath HEMODYNAMIC DATA: Aortic pressure was initially extremely high at 215/92. She  was  given intravenous enalaprilat, intravenous labetalol, and intravenous  nitroglycerin, ultimately bringing her  systolic blood pressure down to less  than  160. Left ventricular pressure was 180/17, with a corresponding aortic  pressure of  180/80. There was no aortic valve gradient.  LEFT VENTRICULOGRAM: Wall motion was normal. Ejection fraction was estimated  t  65%. No mitral regurgitation.  ABDOMINAL AORTOGRAM: This revealed patent renal arteries and abdominal aorta.  The  left common iliac artery had a 75% stenosis. The right external iliac artery  had a  40% stenosis.  CORONARY ARTERIOGRAPHY (Right dominant):  1. The left main was normal.  2. The left anterior descending artery had a 25% stenosis in the proximal to  mid  vessel. The distal LAD had a 50% stenosis just after the bifurcation of the  large second diagonal Kinzleigh Kandler. Further down the distal LAD is a focal 80%  stenosis.  3. The left circumflex had a 20% stenosis in the mid vessel. It gave rise to a  small OM-1, a normal sized OM-2 which had a 25% stenosis, and a normal sized  OM-3 which had a 20% stenosis.  4. The right coronary artery had a diffuse 40% stenosis extending from the mid  o  distal vessel. There was a large posterior descending artery which had a  25%,  followed by a 40% stenosis. There were two small posterolateral branches.  IMPRESSIONS:  1. Normal left ventricular systolic function.  2. Peripheral vascular disease, as described.  3. One-vessel coronary artery disease with significant stenosis in the distal  left  anterior descending artery. This appears to correspond with her EKG changes  and  Cardiolite scan. She has moderate, but non-flow limiting disease in the  right coronary and left circumflex.  PLAN: Percutaneous intervention of the distal LAD, see below.  PTCA PROCEDURAL NOTE: Following the completion of the diagnostic  catheterization,  we opted to proceed with percutaneous intervention. The preexisting 6-French  sheath in the right femoral artery was  exchanged over a wire for a 7-French  sheath.  Integrilin and heparin were administered per protocol. We used a 7-French JL-4  guiding catheter and a BMW wire. The reference vessel was approximately 2 mm  in  diameter. We utilized a 2.0 x 20 mm CrossSail balloon, which was inflated  initially to 12 atm x 2 minutes and then 14 atm x 3 minutes. Final  angiographic  images revealed significant improvement in the lumen with 25% residual stenosis  and  a possible very small non-flow limiting dissection. There was TIMI-3 flow into  the  distal vessel.  COMPLICATIONS: None.  RESULTS: Successful percutaneous transluminal coronary angioplasty of the  distal  left anterior descending artery, reducing an 80% stenosis to 25% residual with  TIMI-3 flow.  PLAN: Integrilin will be continued for 20 hours. The patient needs aggressive  blood pressure control and risk factor reduction.  In regards to her peripheral vascular disease, she will be referred for further  evaluation and possible intervention.  03/2012 Echo LVEF 60-65%, no significant abnormalities   02/19/16 Clinic EKG (performed and reviewed in clinic): sinus bradycardia, no ischemic changes   Assessment and Plan  1. HTN - difficult to control, partly due to medication side effects as described above - bp elevated in clinic. We will start hydralazine 23m bid. She will submit bp log in 2 weeks.   2. PAD - no current symptoms - we will continue medical management  3. Hyperlipidemia - request labs from pcp   F/u 6 months   JArnoldo Lenis M.D.

## 2016-02-20 ENCOUNTER — Ambulatory Visit: Payer: Medicare Other | Admitting: "Endocrinology

## 2016-02-20 ENCOUNTER — Telehealth: Payer: Self-pay | Admitting: Cardiology

## 2016-02-20 ENCOUNTER — Ambulatory Visit: Payer: Medicare Other | Admitting: Nutrition

## 2016-02-20 NOTE — Telephone Encounter (Signed)
[name] [name] called stated that she became dizzy when walking to her truck.

## 2016-02-20 NOTE — Telephone Encounter (Signed)
Patient called to say that she thinks the hydralazine is causing her to be dizzy. Patient advised to take half a tablet twice daily to see if her symptoms improve. Patient verbalized understanding of plan.

## 2016-02-23 NOTE — Telephone Encounter (Signed)
Symptoms noted, can we check back with her to see how she is doing on the lower dose  Zandra Abts MD

## 2016-02-25 ENCOUNTER — Ambulatory Visit: Payer: Medicare Other | Admitting: Cardiology

## 2016-02-26 NOTE — Telephone Encounter (Signed)
Spoke with patient and she said taking 1/2 hydralazine twice daily helped and she has not been dizzy at all.

## 2016-03-02 ENCOUNTER — Ambulatory Visit: Payer: Self-pay | Admitting: "Endocrinology

## 2016-03-08 ENCOUNTER — Telehealth: Payer: Self-pay | Admitting: *Deleted

## 2016-03-08 NOTE — Telephone Encounter (Signed)
BP's show borderline control, overall the numbers are reasonable at this time and I wold continue her current meds. We will continue to monitor. Prior to her appointment wit pcp or [place] I would encourage keeping a bp log starting 1 week prior  Zandra Abts MD

## 2016-03-08 NOTE — Telephone Encounter (Signed)
Pt aware and will keep BP log prior to OV

## 2016-03-08 NOTE — Telephone Encounter (Signed)
Pt called in daily BP log: Will forward to Dr. Harl Bowie 02/19/16 - 134/70 02/20/16 - 136/70 02/21/16 - 134/68 02/22/16 - 123/65 02/23/16 - 144/70 02/24/16 - 145/65 02/25/16 - 141/72 02/26/16 - 134/72 02/27/16 - 145/70 02/28/16 - 132/76 02/29/16 - 142/65 03/01/16 - 133/67 03/02/16 - 124/57

## 2016-05-17 ENCOUNTER — Telehealth: Payer: Self-pay | Admitting: Cardiology

## 2016-05-17 DIAGNOSIS — I10 Essential (primary) hypertension: Secondary | ICD-10-CM

## 2016-05-17 NOTE — Telephone Encounter (Signed)
Can we order a BMEt and Mg for her. Can she see me next Wed Aug 23 at 340pm, can she keep a bp log until then and bring it with her. I'm ok with the decreased med doses at this time  Zandra Abts MD

## 2016-05-17 NOTE — Telephone Encounter (Signed)
Pt agreeable to come 8/23. Will mail pt lab orders and pt will keep BP log until next appt.

## 2016-05-17 NOTE — Telephone Encounter (Signed)
Mrs. Sutera called stating that she is dehydrated. States that she is having cramps.

## 2016-05-17 NOTE — Telephone Encounter (Signed)
Pt says in the last month she has lost 25lbs , has cut down on chlorthalidone 1/2 tab daily 25 mg and 1/2 tab hydralazine 25 mg just today. Says she has been up at night going to bathroom several times at night and says she is weak. BP this this AM 143/72 HR 67. Will forward to Dr. Harl Bowie

## 2016-05-25 ENCOUNTER — Ambulatory Visit (INDEPENDENT_AMBULATORY_CARE_PROVIDER_SITE_OTHER): Payer: Medicare Other | Admitting: Cardiology

## 2016-05-25 ENCOUNTER — Encounter: Payer: Self-pay | Admitting: Cardiology

## 2016-05-25 VITALS — BP 165/82 | HR 61 | Ht 65.0 in | Wt 168.4 lb

## 2016-05-25 DIAGNOSIS — E785 Hyperlipidemia, unspecified: Secondary | ICD-10-CM | POA: Diagnosis not present

## 2016-05-25 DIAGNOSIS — R42 Dizziness and giddiness: Secondary | ICD-10-CM | POA: Diagnosis not present

## 2016-05-25 DIAGNOSIS — I10 Essential (primary) hypertension: Secondary | ICD-10-CM | POA: Diagnosis not present

## 2016-05-25 DIAGNOSIS — I739 Peripheral vascular disease, unspecified: Secondary | ICD-10-CM

## 2016-05-25 MED ORDER — HYDRALAZINE HCL 25 MG PO TABS: 12.5000 mg | ORAL_TABLET | Freq: Two times a day (BID) | ORAL | 3 refills | Status: DC

## 2016-05-25 NOTE — Progress Notes (Signed)
Clinical Summary [name] [name] is a 75 y.o.female seen today for follow up of the following medical problems.   1. HTN - history of angioedema on ACE-I and most recently on ARB.  - she reports amlodopine caused leg swelling. Clonidine caused dizziness.  - we hadstarted chlrothalidone 40m daily. She had some muscle cramping, and the dose was decreased to 237mdaily which resolved these symptoms. - dizziness after starting hydralazine 252mid, she has cut back to 12.5mg80md. '  - compliant with meds  2. Dizziness - started about 1 week ago.Mainly occurs with standing. Comes and goes quickly quickly.  - water by the glass x 4, 1 cup coffee in AM, lemonade 1 glass.    3. CAD - prior PTCA to distal LAD in 2001. 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  - echo 06/2012 LVEF 60-65% - DSE 06/2012 with reported ST elevation in inferior leads, negative echo images for ischemia.   - since last visit denies any chest pain, SOB, DOE - compliant with meds  3. PAD - prior iliac stent in July 2001 - [place] [id number] Right ABI 0.99 Left ABI 0.99, with patent left iliac stent.  - denies any claudication like pain, denies any foot sores or ulcers  4. Hyperlipidemia - compliant with crestor      SH: 2 children, 4 grandchildren. Plans for trip to [place].  Past Medical History:  Diagnosis Date  . 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 Diabetes mellitus   . Hypercholesterolemia   . Hypertension   . Peripheral vascular disease (HCC)[place]  status post iliac stent July 2001      Allergies  Allergen Reactions  . Clonidine Derivatives Other (See Comments)    dizziness  . Lisinopril Other (See Comments)    Tongue swelling  . Losartan     TONGUE SWELLING  . Codeine Nausea And Vomiting  . Hydrocodone Nausea And Vomiting       Current Outpatient Prescriptions  Medication Sig Dispense Refill  . aspirin 81 MG tablet Take 81 mg by mouth daily.    . betamethasone dipropionate (DIPROLENE) 0.05 % cream Apply 1 application topically 2 (two) times daily.    . bimatoprost (LUMIGAN) 0.01 % SOLN Place 1 drop into both eyes at bedtime.    . busPIRone (BUSPAR) 5 MG tablet Take 0.5 tablets (2.5 mg total) by mouth daily. 15 tablet 3  . Calcium Carbonate-Vit D-Min (CALCIUM 1200 PO) Take by mouth.    . chlorthalidone (HYGROTON) 25 MG tablet Take 12.5 mg by mouth daily.    . Cholecalciferol (VITAMIN D3) 2000 UNITS TABS Take 1 tablet by mouth 2 (two) times daily.    . diclofenac (VOLTAREN) 75 MG EC tablet Take 75 mg by mouth daily.    . ENDOCET 10-325 MG per tablet Take 1 tablet by mouth 2 (two) times daily.     . furosemide (LASIX) 40 MG tablet TAKE 1 TAB DAILY AS NEEDED FOR SWELLING 90 tablet 3  . hydrALAZINE (APRESOLINE) 25 MG tablet Take 12.5 mg by mouth daily.    . insulin glargine (LANTUS) 100 UNIT/ML injection Inject 0.4 mLs (40 Units total) into the skin at bedtime. 15 mL 2  . insulin lispro (HUMALOG) 100 UNIT/ML injection Inject 10-16 Units into the skin 3 (three) times daily with meals.    .[name]  metFORMIN (GLUMETZA) 1000 MG (MOD) 24 hr tablet Take 1,000 mg by mouth 2 (two) times daily.    [name] omeprazole (PRILOSEC) 20 MG capsule Take 20 mg by mouth 2 (two) times daily before a meal.     . ONE TOUCH ULTRA TEST test strip     . polyethylene glycol (MIRALAX / GLYCOLAX) packet Take 17 g by mouth daily.    . rosuvastatin (CRESTOR) 10 MG tablet Take 10 mg by mouth daily.    . SYMBICORT 160-4.5 MCG/ACT inhaler Inhale 2 puffs into the lungs 2 (two) times daily.      No current facility-administered medications for this visit.      Past Surgical History:  Procedure Laterality Date  . CERVICAL FUSION    . CORONARY STENT PLACEMENT       Allergies  Allergen Reactions  . Clonidine Derivatives Other (See Comments)     dizziness  . Lisinopril Other (See Comments)    Tongue swelling  . Losartan     TONGUE SWELLING  . Codeine Nausea And Vomiting  . Hydrocodone Nausea And Vomiting      Family History  Problem Relation Age of Onset  . Heart failure Father   . Heart failure Mother   . Diabetes Mother   . COPD Brother      Social History Ms. Macari reports that she quit smoking about 20 years ago. Her smoking use included Cigarettes. She started smoking about 54 years ago. She has a 5.00 pack-year smoking history. She has never used smokeless tobacco. Ms. Nader reports that she does not drink alcohol.   Review of Systems CONSTITUTIONAL: No weight loss, fever, chills, weakness or fatigue.  HEENT: Eyes: No visual loss, blurred vision, double vision or yellow sclerae.No hearing loss, sneezing, congestion, runny nose or sore throat.  SKIN: No rash or itching.  CARDIOVASCULAR: per HPI RESPIRATORY: No shortness of breath, cough or sputum.  GASTROINTESTINAL: No anorexia, nausea, vomiting or diarrhea. No abdominal pain or blood.  GENITOURINARY: No burning on urination, no polyuria NEUROLOGICAL:+dizziness MUSCULOSKELETAL: No muscle, back pain, joint pain or stiffness.  LYMPHATICS: No enlarged nodes. No history of splenectomy.  PSYCHIATRIC: No history of depression or anxiety.  ENDOCRINOLOGIC: No reports of sweating, cold or heat intolerance. No polyuria or polydipsia.  [name]   Physical Examination Vitals:   05/25/16 1050  BP: (!) 165/82  Pulse: 61   Filed Weights   05/25/16 1050  Weight: 168 lb 6.4 oz (76.4 kg)    Gen: resting comfortably, no acute distress HEENT: no scleral icterus, pupils equal round and reactive, no palptable cervical adenopathy,  CV: RRR, 2/6 systolic murmur RUSB, no jvd Resp: Clear to auscultation bilaterally GI: abdomen is soft, non-tender, non-distended, normal bowel sounds, no hepatosplenomegaly MSK: extremities are warm, no edema.  Skin: warm, no rash Neuro:  no  focal deficits Psych: appropriate affect   Diagnostic Studies 11/1999 Cath HEMODYNAMIC DATA: Aortic pressure was initially extremely high at 215/92. She  was  given intravenous enalaprilat, intravenous labetalol, and intravenous  nitroglycerin, ultimately bringing her systolic blood pressure down to less  than  160. Left ventricular pressure was 180/17, with a corresponding aortic  pressure of  180/80. There was no aortic valve gradient.  LEFT VENTRICULOGRAM: Wall motion was normal. Ejection fraction was estimated  t  65%. No mitral regurgitation.  ABDOMINAL AORTOGRAM: This revealed patent renal arteries and abdominal aorta.  The  left common iliac artery had a 75% stenosis. The right external iliac artery  had  a  40% stenosis.  CORONARY ARTERIOGRAPHY (Right dominant):  1. The left main was normal.  2. The left anterior descending artery had a 25% stenosis in the proximal to  mid  vessel. The distal LAD had a 50% stenosis just after the bifurcation of the  large second diagonal branch. Further down the distal LAD is a focal 80%  stenosis.  3. The left circumflex had a 20% stenosis in the mid vessel. It gave rise to a  small OM-1, a normal sized OM-2 which had a 25% stenosis, and a normal sized  OM-3 which had a 20% stenosis.  4. The right coronary artery had a diffuse 40% stenosis extending from the mid  o  distal vessel. There was a large posterior descending artery which had a  25%,  followed by a 40% stenosis. There were two small posterolateral branches.  IMPRESSIONS:  1. Normal left ventricular systolic function.  2. Peripheral vascular disease, as described.  3. One-vessel coronary artery disease with significant stenosis in the distal  left  anterior descending artery. This appears to correspond with her EKG changes  and  Cardiolite scan. She has moderate, but non-flow limiting disease in the  right coronary and left  circumflex.  PLAN: Percutaneous intervention of the distal LAD, see below.  PTCA PROCEDURAL NOTE: Following the completion of the diagnostic  catheterization,  we opted to proceed with percutaneous intervention. The preexisting 6-French  sheath in the right femoral artery was exchanged over a wire for a 7-French  sheath.  Integrilin and heparin were administered per protocol. We used a 7-French JL-4  guiding catheter and a BMW wire. The reference vessel was approximately 2 mm  in  diameter. We utilized a 2.0 x 20 mm CrossSail balloon, which was inflated  initially to 12 atm x 2 minutes and then 14 atm x 3 minutes. Final  angiographic  images revealed significant improvement in the lumen with 25% residual stenosis  and  a possible very small non-flow limiting dissection. There was TIMI-3 flow into  the  distal vessel.  COMPLICATIONS: None.  RESULTS: Successful percutaneous transluminal coronary angioplasty of the  distal  left anterior descending artery, reducing an 80% stenosis to 25% residual with  TIMI-3 flow.  PLAN: Integrilin will be continued for 20 hours. The patient needs aggressive  blood pressure control and risk factor reduction.  In regards to her peripheral vascular disease, she will be referred for further  evaluation and possible intervention.  03/2012 Echo LVEF 60-65%, no significant abnormalities   02/19/16 Clinic EKG (performed and reviewed in clinic): sinus bradycardia, no ischemic changes    Assessment and Plan  1. HTN - difficult to control, partly due to medication side effects as described above - ongoing issues with orthostatic dizziness, we are going to have to accept high bp targets for her given her symptoms. We will stop chlorthalidone. Continhe hydralazine 12.67m bid.   2. PAD - no current symptoms - continue to monitor  3. Hyperlipidemia - continue statin  4. Orhostatic dizziness - orthostatic in clinic today,  bp drops 26 points with standing - stop chlorthalidone, encourged increased oral hydration   F/u 4 weeks.       JArnoldo Lenis M.D.

## 2016-05-25 NOTE — Patient Instructions (Signed)
Your physician recommends that you schedule a follow-up appointment in: 4-[address]  Your physician has recommended you make the following change in your medication:   STOP CHLORTHALIDONE   DECREASE HYDRALAZINE 12.5 MG TWICE DAILY  Thank you for choosing [facility]!!

## 2016-05-26 ENCOUNTER — Ambulatory Visit: Payer: Medicare Other | Admitting: Cardiology

## 2016-05-31 ENCOUNTER — Telehealth: Payer: Self-pay | Admitting: Cardiology

## 2016-05-31 NOTE — Telephone Encounter (Signed)
[name] [name] called stating that her blood pressure is elevated.

## 2016-05-31 NOTE — Telephone Encounter (Signed)
Pt says when she takes her morning walk, says she has burning in throat, says she takes prevacid and it clears this up - told pt to contact pcp for this. Pt says BP has also been elevated 160s/60s since last office visit. Recently OV decrease hydralazine to 12.5 mg bid. Denies CP/dizziness, swelling.  Will forward to Dr. Domenic Polite, Dr. Harl Bowie out of office.

## 2016-05-31 NOTE — Telephone Encounter (Signed)
Pt aware and will f/u with Dr. Harl Bowie 9/29 - says she will call back if BP goes higher or other symptoms develop

## 2016-05-31 NOTE — Telephone Encounter (Signed)
Patient of Dr. Harl Bowie. I reviewed the recent office note from August 22. Blood pressure was 165/82 at that time. She was also having orthostatic dizziness and Dr. Harl Bowie mentioned excepting higher blood pressure targets due to these symptoms, stopped her chlorthalidone and left hydralazine at 12.5 mg twice daily. With blood pressure recently in similar range, would continue with current plan. This can always be reassessed to see if she might be able to tolerate higher dose hydralazine now that diuretics have been stopped.

## 2016-06-09 ENCOUNTER — Telehealth: Payer: Self-pay | Admitting: Cardiology

## 2016-06-09 NOTE — Telephone Encounter (Signed)
Pt says BP has been running between 160s-190s/70s-80s HR between 50-60s. Pt says been having slight headache but denies any other symptoms.   Will forward to Dr. Harl Bowie

## 2016-06-09 NOTE — Telephone Encounter (Signed)
[name] [name] called stating that she continues to have elevated BP issues.

## 2016-06-10 MED ORDER — HYDRALAZINE HCL 25 MG PO TABS: 25.0000 mg | ORAL_TABLET | Freq: Two times a day (BID) | ORAL | 3 refills | Status: DC

## 2016-06-10 NOTE — Addendum Note (Signed)
Addended by: Julian Hy T on: 06/10/2016 02:38 PM   Modules accepted: Orders

## 2016-06-10 NOTE — Telephone Encounter (Signed)
Lets have her increase her hydrlazine to 25mg  bid, have her update [place] early next week.  Zandra Abts MD

## 2016-06-10 NOTE — Telephone Encounter (Signed)
Pt aware and will update next week on BP. Medication sent to pharmacy.

## 2016-06-10 NOTE — Telephone Encounter (Signed)
How is her dizziness doing? Let me know if improved and will consider medication changes  Zandra Abts MD

## 2016-06-10 NOTE — Telephone Encounter (Signed)
Pt says she is not having any dizziness, only slight headache when BP runs high

## 2016-06-15 ENCOUNTER — Telehealth: Payer: Self-pay | Admitting: Cardiology

## 2016-06-15 NOTE — Telephone Encounter (Signed)
If bp remaining elevated can increase hydralazine to 50mg  bid   Zandra Abts MD

## 2016-06-15 NOTE — Telephone Encounter (Signed)
No chest pain, SOB, or dizziness.  Recent increase on the Hydralazine to 25mg  twice a day.  This was changed from the Chlorthalidone due to dizziness.  BP 191/88  68  - 6:00 am.  Stated that headache has eased off some, but still there.  Did state that she had feeling of pins & needles in her head, neck, and going down her arm.  Stated she took her Hydralazine 25mg  at 1:00 this am & it did bring it down.  Now just slight feeling of pins & needles in her scalp.  Message sent to provider.  Advised to go to ED in the meantime if symptoms worsen.    Please call back on mobile:  [phone].

## 2016-06-15 NOTE — Telephone Encounter (Signed)
Patient called stating that she woke up this am 1AM with a terrible headache, BP was 198/90 pulse 67. States that she feels like she has needles running up and down her arms with pain in her neck.

## 2016-06-15 NOTE — Telephone Encounter (Signed)
Please clarify, she was just increased to twice a day.  Did you mean three times a day ?  See recent telephone notes in regards to the previous increase.

## 2016-06-16 MED ORDER — HYDRALAZINE HCL 25 MG PO TABS: 50.0000 mg | ORAL_TABLET | Freq: Two times a day (BID) | ORAL | Status: DC

## 2016-06-16 NOTE — Telephone Encounter (Signed)
From notes she was increased to 25mg  bid of hydralazine and bp's remained elevated. I'd like to have her take a 50mg  tabled twice a day   J Jasemine Nawaz MD

## 2016-06-16 NOTE — Telephone Encounter (Signed)
Patient notified.   BP today 175/83  71.

## 2016-06-17 ENCOUNTER — Emergency Department (HOSPITAL_COMMUNITY): Payer: Medicare Other

## 2016-06-17 ENCOUNTER — Encounter (HOSPITAL_COMMUNITY): Payer: Self-pay

## 2016-06-17 ENCOUNTER — Observation Stay (HOSPITAL_COMMUNITY)
Admission: EM | Admit: 2016-06-17 | Discharge: 2016-06-18 | Disposition: A | Discharge status: Home / Self Care | Payer: Medicare Other | Attending: Internal Medicine | Admitting: Internal Medicine

## 2016-06-17 DIAGNOSIS — Z794 Long term (current) use of insulin: Secondary | ICD-10-CM | POA: Diagnosis not present

## 2016-06-17 DIAGNOSIS — R0789 Other chest pain: Secondary | ICD-10-CM

## 2016-06-17 DIAGNOSIS — I160 Hypertensive urgency: Secondary | ICD-10-CM | POA: Diagnosis not present

## 2016-06-17 DIAGNOSIS — R51 Headache: Secondary | ICD-10-CM | POA: Insufficient documentation

## 2016-06-17 DIAGNOSIS — Z87891 Personal history of nicotine dependence: Secondary | ICD-10-CM | POA: Insufficient documentation

## 2016-06-17 DIAGNOSIS — J45909 Unspecified asthma, uncomplicated: Secondary | ICD-10-CM | POA: Diagnosis not present

## 2016-06-17 DIAGNOSIS — E119 Type 2 diabetes mellitus without complications: Secondary | ICD-10-CM | POA: Diagnosis not present

## 2016-06-17 DIAGNOSIS — I2510 Atherosclerotic heart disease of native coronary artery without angina pectoris: Secondary | ICD-10-CM | POA: Diagnosis not present

## 2016-06-17 DIAGNOSIS — I25118 Atherosclerotic heart disease of native coronary artery with other forms of angina pectoris: Secondary | ICD-10-CM | POA: Diagnosis not present

## 2016-06-17 DIAGNOSIS — Z7982 Long term (current) use of aspirin: Secondary | ICD-10-CM | POA: Insufficient documentation

## 2016-06-17 DIAGNOSIS — R079 Chest pain, unspecified: Secondary | ICD-10-CM

## 2016-06-17 DIAGNOSIS — Z79899 Other long term (current) drug therapy: Secondary | ICD-10-CM | POA: Insufficient documentation

## 2016-06-17 DIAGNOSIS — Z7984 Long term (current) use of oral hypoglycemic drugs: Secondary | ICD-10-CM | POA: Insufficient documentation

## 2016-06-17 LAB — BASIC METABOLIC PANEL
ANION GAP: 10 (ref 5–15)
BUN: 12 mg/dL (ref 6–20)
CHLORIDE: 99 mmol/L — AB (ref 101–111)
CO2: 28 mmol/L (ref 22–32)
Calcium: 9.9 mg/dL (ref 8.9–10.3)
Creatinine, Ser: 0.72 mg/dL (ref 0.44–1.00)
GFR calc Af Amer: >60 mL/min (ref >60)
GLUCOSE: 130 mg/dL — AB (ref 65–99)
POTASSIUM: 4.8 mmol/L (ref 3.5–5.1)
SODIUM: 137 mmol/L (ref 135–145)

## 2016-06-17 LAB — CBC WITH DIFFERENTIAL/PLATELET
BASOS ABS: 0 10*3/uL (ref 0.0–0.1)
Basophils Relative: 0 %
EOS PCT: 1 %
Eosinophils Absolute: 0.1 10*3/uL (ref 0.0–0.7)
HCT: 41.8 % (ref 36.0–46.0)
HEMOGLOBIN: 14.5 g/dL (ref 12.0–15.0)
LYMPHS ABS: 1.3 10*3/uL (ref 0.7–4.0)
LYMPHS PCT: 14 %
MCH: 31 pg (ref 26.0–34.0)
MCHC: 34.7 g/dL (ref 30.0–36.0)
MCV: 89.5 fL (ref 78.0–100.0)
Monocytes Absolute: 0.6 10*3/uL (ref 0.1–1.0)
Monocytes Relative: 7 %
NEUTROS PCT: 78 %
Neutro Abs: 7.3 10*3/uL (ref 1.7–7.7)
PLATELETS: 267 10*3/uL (ref 150–400)
RBC: 4.67 MIL/uL (ref 3.87–5.11)
RDW: 13.5 % (ref 11.5–15.5)
WBC: 9.4 10*3/uL (ref 4.0–10.5)

## 2016-06-17 LAB — GLUCOSE, CAPILLARY
GLUCOSE-CAPILLARY: 292 mg/dL — AB (ref 65–99)
Glucose-Capillary: 225 mg/dL — ABNORMAL HIGH (ref 65–99)

## 2016-06-17 LAB — CBG MONITORING, ED
GLUCOSE-CAPILLARY: 154 mg/dL — AB (ref 65–99)
Glucose-Capillary: 180 mg/dL — ABNORMAL HIGH (ref 65–99)

## 2016-06-17 LAB — TROPONIN I
Troponin I: <0.03 ng/mL (ref <0.03)
Troponin I: <0.03 ng/mL (ref <0.03)

## 2016-06-17 MED ORDER — ONDANSETRON HCL 4 MG/2ML IJ SOLN: 4.0000 mg | Freq: Four times a day (QID) | INTRAMUSCULAR | Status: DC | PRN

## 2016-06-17 MED ORDER — INSULIN GLARGINE 100 UNIT/ML ~~LOC~~ SOLN: 10.0000 [IU] | Freq: Two times a day (BID) | SUBCUTANEOUS | Status: DC

## 2016-06-17 MED ORDER — HYDRALAZINE HCL 20 MG/ML IJ SOLN
5.0000 mg | Freq: Once | INTRAMUSCULAR | Status: AC
  Administered 2016-06-17: 5 mg via INTRAVENOUS
  Filled 2016-06-17: qty 1

## 2016-06-17 MED ORDER — HYDRALAZINE HCL 25 MG PO TABS
50.0000 mg | ORAL_TABLET | Freq: Three times a day (TID) | ORAL | Status: DC
  Administered 2016-06-17 (×2): 50 mg via ORAL
  Filled 2016-06-17 (×6): qty 2

## 2016-06-17 MED ORDER — ACETAMINOPHEN 325 MG PO TABS: 650.0000 mg | ORAL_TABLET | ORAL | Status: DC | PRN

## 2016-06-17 MED ORDER — GI COCKTAIL ~~LOC~~: 30.0000 mL | Freq: Four times a day (QID) | ORAL | Status: DC | PRN

## 2016-06-17 MED ORDER — LABETALOL HCL 5 MG/ML IV SOLN
10.0000 mg | INTRAVENOUS | Status: DC | PRN
  Administered 2016-06-17 – 2016-06-18 (×2): 10 mg via INTRAVENOUS
  Filled 2016-06-17 (×2): qty 4

## 2016-06-17 MED ORDER — INSULIN ASPART 100 UNIT/ML ~~LOC~~ SOLN
0.0000 [IU] | Freq: Every day | SUBCUTANEOUS | Status: DC
  Administered 2016-06-17: 2 [IU] via SUBCUTANEOUS

## 2016-06-17 MED ORDER — INSULIN GLARGINE 100 UNIT/ML ~~LOC~~ SOLN
40.0000 [IU] | Freq: Every day | SUBCUTANEOUS | Status: DC
Start: 2016-06-17 — End: 2016-06-18
  Administered 2016-06-17: 40 [IU] via SUBCUTANEOUS
  Filled 2016-06-17 (×4): qty 0.4

## 2016-06-17 MED ORDER — ENOXAPARIN SODIUM 40 MG/0.4ML ~~LOC~~ SOLN
40.0000 mg | SUBCUTANEOUS | Status: DC
  Administered 2016-06-17: 40 mg via SUBCUTANEOUS
  Filled 2016-06-17: qty 0.4

## 2016-06-17 MED ORDER — ROSUVASTATIN CALCIUM 10 MG PO TABS
10.0000 mg | ORAL_TABLET | Freq: Every day | ORAL | Status: DC
  Administered 2016-06-17: 10 mg via ORAL
  Filled 2016-06-17 (×2): qty 1

## 2016-06-17 MED ORDER — LATANOPROST 0.005 % OP SOLN
1.0000 [drp] | Freq: Every day | OPHTHALMIC | Status: DC
  Administered 2016-06-17: 1 [drp] via OPHTHALMIC
  Filled 2016-06-17: qty 2.5

## 2016-06-17 MED ORDER — INSULIN ASPART 100 UNIT/ML ~~LOC~~ SOLN
0.0000 [IU] | Freq: Three times a day (TID) | SUBCUTANEOUS | Status: DC
  Administered 2016-06-17: 3 [IU] via SUBCUTANEOUS
  Administered 2016-06-17: 8 [IU] via SUBCUTANEOUS
  Administered 2016-06-18: 3 [IU] via SUBCUTANEOUS
  Administered 2016-06-18: 11 [IU] via SUBCUTANEOUS
  Filled 2016-06-17: qty 1

## 2016-06-17 MED ORDER — BUSPIRONE HCL 5 MG PO TABS
2.5000 mg | ORAL_TABLET | Freq: Every day | ORAL | Status: DC
  Administered 2016-06-17: 2.5 mg via ORAL
  Filled 2016-06-17: qty 1
  Filled 2016-06-17: qty 0.5

## 2016-06-17 MED ORDER — INSULIN GLARGINE 100 UNIT/ML ~~LOC~~ SOLN
10.0000 [IU] | Freq: Every day | SUBCUTANEOUS | Status: DC
Start: 2016-06-18 — End: 2016-06-18
  Administered 2016-06-18: 10 [IU] via SUBCUTANEOUS
  Filled 2016-06-17 (×4): qty 0.1

## 2016-06-17 MED ORDER — ASPIRIN EC 81 MG PO TBEC
81.0000 mg | DELAYED_RELEASE_TABLET | Freq: Every day | ORAL | Status: DC
  Administered 2016-06-17: 81 mg via ORAL
  Filled 2016-06-17: qty 1

## 2016-06-17 MED ORDER — PANTOPRAZOLE SODIUM 40 MG PO TBEC
40.0000 mg | DELAYED_RELEASE_TABLET | Freq: Every day | ORAL | Status: DC
  Administered 2016-06-17 – 2016-06-18 (×2): 40 mg via ORAL
  Filled 2016-06-17 (×2): qty 1

## 2016-06-17 MED ORDER — MOMETASONE FURO-FORMOTEROL FUM 200-5 MCG/ACT IN AERO
2.0000 | INHALATION_SPRAY | Freq: Two times a day (BID) | RESPIRATORY_TRACT | Status: DC
  Administered 2016-06-17 – 2016-06-18 (×2): 2 via RESPIRATORY_TRACT
  Filled 2016-06-17: qty 8.8

## 2016-06-17 MED ORDER — LABETALOL HCL 5 MG/ML IV SOLN
10.0000 mg | Freq: Once | INTRAVENOUS | Status: AC
  Administered 2016-06-17: 10 mg via INTRAVENOUS
  Filled 2016-06-17: qty 4

## 2016-06-17 NOTE — ED Provider Notes (Signed)
[facility] DEPT Provider Note   CSN: [id number] Arrival date & time: 06/17/16  0845     History   Chief Complaint Chief Complaint  Patient presents with  . Chest Pain    HPI [name] [name] is a 75 y.o. female.  Patient is a 75 year old female who presents to the emergency department with a complaint of chest pain.  The patient states that this morning after getting up she noticed a tight sharp pain in the mid chest. The patient states she had mild nausea and some shortness of breath initially. She states that her blood pressure was elevated this morning. It is of note that the patient suffers from hypertension. She has been seen by cardiology in an attempt to get her blood pressure under some control. She was on chlorthalidone, but she states that made her feel weak and dizzy. She was recently taken off this medication, and her hydralazine was increased to 50 mg twice a day from 25 mg twice a day. Today was her second dose with the new instructions. She denies any loss of consciousness this morning, states she did not have sweats. She states that this tightness is new, she has not had this problem in the recent past. She has not taken any nitroglycerin, but has taken aspirin.   The history is provided by the patient.  Chest Pain   This is a new problem. The current episode started 3 to 5 hours ago. Pertinent negatives include no abdominal pain, no nausea, no shortness of breath and no vomiting.    Past Medical History:  Diagnosis Date  . 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 Diabetes mellitus   . Hypercholesterolemia   . Hypertension   . Peripheral vascular disease ([place])     status post iliac stent July 2001     Patient Active Problem List   Diagnosis Date Noted  . Hyperlipidemia 10/29/2013  . CAD (coronary artery disease) 10/29/2013  . 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 Asthma 10/29/2013  . Osteopenia 10/29/2013  . Insomnia 10/29/2013  . Chronic constipation 10/29/2013  . 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 Skin lesion 06/09/2012  . 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 Obstructive sleep apnea 06/09/2012  . Hypertension   . Peripheral vascular disease ([place])   . Coronary artery disease   . Diabetes mellitus     Past Surgical History:  Procedure Laterality Date  . CERVICAL FUSION    . CHOLECYSTECTOMY    . CORONARY STENT PLACEMENT    . stent in r leg      OB History    No data available       Home Medications    Prior to Admission medications   Medication Sig Start Date End Date Taking? Authorizing Provider  aspirin 81 MG tablet Take 81 mg by mouth daily.    Historical Provider, MD  betamethasone dipropionate (DIPROLENE) 0.05 % cream Apply 1 application topically 2 (two) times daily.    Historical Provider, MD  bimatoprost (LUMIGAN) 0.01 % SOLN Place 1 drop into both eyes at bedtime.    Historical Provider, MD  busPIRone (BUSPAR) 5 MG tablet Take 0.5 tablets (2.5 mg total) by mouth daily. 02/17/16   [name], MD  Calcium Carbonate-Vit D-Min (CALCIUM 1200 PO) Take by mouth.    Historical Provider, MD  Cholecalciferol (VITAMIN D3) 2000  UNITS TABS Take 1 tablet by mouth 2 (two) times daily.    Historical Provider, MD  diclofenac (VOLTAREN) 75 MG EC tablet Take 75 mg by mouth daily.    Historical Provider, MD  ENDOCET 10-325 MG per tablet Take 1 tablet by mouth 2 (two) times daily.  10/18/13   Historical Provider, MD  furosemide (LASIX) 40 MG tablet TAKE 1 TAB DAILY AS NEEDED FOR SWELLING 06/12/15   [name], MD  hydrALAZINE (APRESOLINE) 25 MG tablet Take 2 tablets (50 mg total) by mouth 2 (two) times daily. 06/16/16   [name], MD  insulin glargine (LANTUS) 100 UNIT/ML injection Inject 0.4 mLs (40 Units total) into the skin at bedtime. 02/13/16   Cassandria Anger, MD  insulin lispro  (HUMALOG) 100 UNIT/ML injection Inject 10-16 Units into the skin 3 (three) times daily with meals.    Historical Provider, MD  metFORMIN (GLUMETZA) 1000 MG (MOD) 24 hr tablet Take 1,000 mg by mouth 2 (two) times daily.    Historical Provider, MD  omeprazole (PRILOSEC) 20 MG capsule Take 20 mg by mouth 2 (two) times daily before a meal.     Historical Provider, MD  ONE TOUCH ULTRA TEST test strip  10/08/13   Historical Provider, MD  polyethylene glycol (MIRALAX / GLYCOLAX) packet Take 17 g by mouth daily.    Historical Provider, MD  rosuvastatin (CRESTOR) 10 MG tablet Take 10 mg by mouth daily.    Historical Provider, MD  SYMBICORT 160-4.5 MCG/ACT inhaler Inhale 2 puffs into the lungs 2 (two) times daily.  10/18/13   Historical Provider, MD    Family History Family History  Problem Relation Age of Onset  . Heart failure Father   . Heart failure Mother   . Diabetes Mother   . COPD Brother     Social History Social History  Substance Use Topics  . Smoking status: Former Smoker    Packs/day: 0.50    Years: 10.00    Types: Cigarettes    Start date: 11/30/1961    Quit date: 10/05/1995  . Smokeless tobacco: Never Used  . Alcohol use No     Allergies   Clonidine derivatives; Lisinopril; Losartan; Codeine; and Hydrocodone   Review of Systems Review of Systems  Respiratory: Negative for shortness of breath.   Cardiovascular: Positive for chest pain.  Gastrointestinal: Negative for abdominal pain, nausea and vomiting.  All other systems reviewed and are negative.    Physical Exam Updated Vital Signs BP (!) 249/75 (BP Location: Left Arm)   Pulse 75   Temp 98.7 F (37.1 C) (Oral)   Resp 19   Ht 5\' 5"  (1.651 m)   Wt 74.8 kg   SpO2 97%   BMI 27.46 kg/m   Physical Exam  Constitutional: She appears well-developed and well-nourished. No distress.  HENT:  Head: Normocephalic and atraumatic.  Right Ear: External ear normal.  Left Ear: External ear normal.  Eyes: Conjunctivae are  normal. Right eye exhibits no discharge. Left eye exhibits no discharge. No scleral icterus.  Neck: Neck supple. No tracheal deviation present.  Cardiovascular: Normal rate, regular rhythm and intact distal pulses.   Murmur heard.  Systolic murmur is present with a grade of 6/6  Pulmonary/Chest: Effort normal and breath sounds normal. No stridor. No respiratory distress. She has no wheezes. She has no rales.  Abdominal: Soft. Bowel sounds are normal. She exhibits no distension. There is no tenderness. There is no rebound and no guarding.  Musculoskeletal:  She exhibits no edema or tenderness.  Neurological: She is alert. She has normal strength. No cranial nerve deficit (no facial droop, extraocular movements intact, no slurred speech) or sensory deficit. She exhibits normal muscle tone. She displays no seizure activity. Coordination normal.  Skin: Skin is warm and dry. No rash noted.  Psychiatric: Her mood appears anxious.  Nursing note and vitals reviewed.    ED Treatments / Results  Labs (all labs ordered are listed, but only abnormal results are displayed) Labs Reviewed  CBC WITH DIFFERENTIAL/PLATELET  BASIC METABOLIC PANEL  TROPONIN I    EKG  EKG Interpretation None       Radiology No results found.  Procedures Procedures (including critical care time)  Medications Ordered in ED Medications - No data to display   Initial Impression / Assessment and Plan / ED Course  Pt seen with me by Dr Wyvonnia Dusky.  I have reviewed the triage vital signs and the nursing notes.  Pertinent labs & imaging results that were available during my care of the patient were reviewed by me and considered in my medical decision making (see chart for details).  Clinical Course    *I have reviewed nursing notes, vital signs, and all appropriate lab and imaging results for this patient.**  Final Clinical Impressions(s) / ED Diagnoses  Pt pain free at this time.  Complete blood count is  within normal limits. Troponin is negative for acute event is less than 0.03. The basic metabolic panel is nonacute. The electrocardiogram is negative for acute arrhythmia or life-threatening event.  The blood pressure has been elevated since her arrival. Chest x-ray is negative for acute event.  Patient remains pain-free.  CT head scan reveals atrophy and gray matter changes, but no acute event.  Case discussed with the triad hospitalist. They will admit the patient.    Final diagnoses:  None    New Prescriptions New Prescriptions   No medications on file     Lily Kocher, PA-C 06/17/16 [place], MD 06/17/16 1819

## 2016-06-17 NOTE — Progress Notes (Signed)
Discussed patient with Dr Coralyn Pear, she is well known to me from cardiology clinic. Admitted with severe HTN and chest pain. We have been working on her bp's as outpatient, has been difficult due to side effects to multiple agents. She has had angioedema on both ACE-Is and ARBs, amlodopine caused leg swelling, clonidine caused significant dizziness. She had been on chlorthalidone, 50mg  caused muscle cramping, lower dose 25mg  daily caused significant dizziness. She was orthostatic last clinic visit and chlorthalidone was stopped. She had been on hydralazine which we had been titrating as outpatient, most recently 50mg  bid.   Recommend increasing hydralazine to 50mg  tid intiailly, room to titrate as needed to max of 100mg  tid. She historically has had low normal heart rates and thus has not been tried on beta blockers. This could be considered pending heart rates in hospital, could also consider aldactone or trial of lower dose of chlorthalidone. We will follow peripherally at this time and monitor bp's   Zandra Abts MD

## 2016-06-17 NOTE — Progress Notes (Signed)
Received report from [facility] in ED regarding patient coming to rm [id number]. Oswald Hillock, RN

## 2016-06-17 NOTE — ED Triage Notes (Addendum)
Ems reports pt's pcp increased her HCTZ  Yesterday because her bp has been elevated.  Reports pt started having some chest tightness this morning after getting up and moving around.  EMS reports pt was anxious upon their arrival and her bp was 214/100 and HR 98.  Pt took 1 baby aspirin at home so ems adminsitered 3 more.  Pt presently denies any chest pain but reports headache.  CBG was 108.

## 2016-06-17 NOTE — ED Provider Notes (Signed)
HPI Comments: [name] is a 75 y.o. female who presents to the Emergency Department complaining of high blood pressure, with associated mild intermittent CP, nausea and SOB. She knew her BP was elevated because she checks it every morning. She took her BP medication this morning. She is no longer having chest pain or any other pain or sx. She has a past hx of stent placement. She is not currently dizzy or having light headedness. She denies dizziness, lightheadedness, blurred vision.  Her BP medication (Chlorthalidone ) was changed two days ago by her cardiologist because she was having associated dizziness. She was switched to Hydralazine 25mg  BID two days ago, and due to persistently high BP she was increased to 50mg  BID yesterday. She has not taken nitro. She has taken three aspirin this morning, one on waking, one when she had chest pain and one was given to her by EMS.   Physical: Well appearing, CTAB RRR. 5/5 strength throughout.   Labs, CT head. EKG unchanged. BP uncontrolled.   I personally performed the services described in this documentation, which was scribed in my presence. The recorded information has been reviewed and is accurate.   Ezequiel Essex, MD 06/17/16 1816

## 2016-06-17 NOTE — H&P (Signed)
History and Physical    LAURALYNN RAUL [id number] DOB: 05-20-1941 DOA: 06/17/2016  PCP: Glenda Chroman, MD  Patient coming from:   Chief Complaint: Chest Pain  HPI: [name] [name] is a 75 y.o. female with a past medical history of hypertension, insulin-dependent diabetes mellitus, coronary artery disease, presented to the emergency department with complaints of chest pain. She reports chest pain started this morning after taking a walk, located in the retrosternal area characterized as pressure-like, nonradiating, not associate with shortness of breath, nausea, vomiting. She states that her blood pressures have been difficult to control lately and has been seeing Dr Harl Bowie of cardiology. She has not tolerated chlorthalidone, clonidine, Norvasc and developed angioedema with ACE inhibitors. Her cardiologist recently increased hydralazine dose to 50 mg by mouth twice a day.   ED Course: She is currently chest pain-free, reports symptoms improving after taking aspirin. Lab work in the emergency department included troponin which was negative. EKG did not reveal acute ischemic changes. I discussed this case with her cardiologist Dr. Harl Bowie  Review of Systems: As per HPI otherwise 10 point review of systems negative.    Past Medical History:  Diagnosis Date  . 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 Diabetes mellitus   . Hypercholesterolemia   . Hypertension   . Peripheral vascular disease ([place])     status post iliac stent July 2001     Past Surgical History:  Procedure Laterality Date  . CERVICAL FUSION    . CHOLECYSTECTOMY    . CORONARY STENT PLACEMENT    . stent in r leg       reports that she quit smoking about 20 years ago. Her smoking use included Cigarettes. She started smoking about 54 years ago. She has a 5.00 pack-year smoking history. She has never used smokeless tobacco. She  reports that she does not drink alcohol or use drugs.  Allergies  Allergen Reactions  . Clonidine Derivatives Other (See Comments)    dizziness  . Lisinopril Other (See Comments)    Tongue swelling  . Losartan     TONGUE SWELLING  . Codeine Nausea And Vomiting  . Hydrocodone Nausea And Vomiting    Family History  Problem Relation Age of Onset  . Heart failure Father   . Heart failure Mother   . Diabetes Mother   . COPD Brother      Prior to Admission medications   Medication Sig Start Date End Date Taking? Authorizing Provider  aspirin 81 MG tablet Take 81 mg by mouth at bedtime.    Yes Historical Provider, MD  betamethasone dipropionate (DIPROLENE) 0.05 % cream Apply 1 application topically 2 (two) times daily.   Yes Historical Provider, MD  bimatoprost (LUMIGAN) 0.01 % SOLN Place 1 drop into both eyes at bedtime.   Yes Historical Provider, MD  busPIRone (BUSPAR) 5 MG tablet Take 0.5 tablets (2.5 mg total) by mouth daily. Patient taking differently: Take 2.5 mg by mouth at bedtime.  02/17/16  Yes [name], MD  Cholecalciferol (VITAMIN D3) 2000 UNITS TABS Take 1 tablet by mouth daily.    Yes Historical Provider, MD  diclofenac (VOLTAREN) 75 MG EC tablet Take 75 mg by mouth at bedtime.    Yes Historical Provider, MD  ENDOCET 10-325 MG per tablet Take 1 tablet by mouth 2 (two) times daily.  10/18/13  Yes Historical Provider, MD  hydrALAZINE (APRESOLINE) 25 MG tablet Take 2 tablets (50 mg total) by mouth 2 (two) times daily. 06/16/16  Yes [name], MD  insulin glargine (LANTUS) 100 UNIT/ML injection Inject 0.4 mLs (40 Units total) into the skin at bedtime. Patient taking differently: Inject 10-40 Units into the skin 2 (two) times daily. 10 units in the morning and 40 units in the evening. 02/13/16  Yes Cassandria Anger, MD  insulin lispro (HUMALOG) 100 UNIT/ML injection Inject 10-16 Units into the skin 3 (three) times daily with meals.   Yes Historical Provider, MD    metFORMIN (GLUMETZA) 1000 MG (MOD) 24 hr tablet Take 1,000 mg by mouth 2 (two) times daily.   Yes Historical Provider, MD  omeprazole (PRILOSEC) 20 MG capsule Take 20 mg by mouth 2 (two) times daily before a meal.    Yes Historical Provider, MD  polyethylene glycol (MIRALAX / GLYCOLAX) packet Take 17 g by mouth daily as needed for moderate constipation.    Yes Historical Provider, MD  rosuvastatin (CRESTOR) 10 MG tablet Take 10 mg by mouth at bedtime.    Yes Historical Provider, MD  SYMBICORT 160-4.5 MCG/ACT inhaler Inhale 2 puffs into the lungs 2 (two) times daily.  10/18/13  Yes Historical Provider, MD  ONE TOUCH ULTRA TEST test strip  10/08/13   Historical Provider, MD    Physical Exam: Vitals:   06/17/16 1000 06/17/16 1100 06/17/16 1130 06/17/16 1200  BP: (!) 193/104 194/69 184/70 (!) 223/68  Pulse: 73 75 77 78  Resp: 18 17 16 14   Temp:      TempSrc:      SpO2: 98% 94% 94% 97%  Weight:      Height:          Constitutional: NAD, calm, comfortable, nontoxic appearing Vitals:   06/17/16 1000 06/17/16 1100 06/17/16 1130 06/17/16 1200  BP: (!) 193/104 194/69 184/70 (!) 223/68  Pulse: 73 75 77 78  Resp: 18 17 16 14   Temp:      TempSrc:      SpO2: 98% 94% 94% 97%  Weight:      Height:       Eyes: PERRL, lids and conjunctivae normal ENMT: Mucous membranes are moist. Posterior pharynx clear of any exudate or lesions.Normal dentition.  Neck: normal, supple, no masses, no thyromegaly Respiratory: clear to auscultation bilaterally, no wheezing, no crackles. Normal respiratory effort. No accessory muscle use.  Cardiovascular: Regular rate and rhythm, no murmurs / rubs / gallops. No extremity edema. 2+ pedal pulses. No carotid bruits.  Abdomen: no tenderness, no masses palpated. No hepatosplenomegaly. Bowel sounds positive.  Musculoskeletal: no clubbing / cyanosis. No joint deformity upper and lower extremities. Good ROM, no contractures. Normal muscle tone.  Skin: no rashes, lesions,  ulcers. No induration Neurologic: CN 2-12 grossly intact. Sensation intact, DTR normal. Strength 5/5 in all 4.  Psychiatric: Normal judgment and insight. Alert and oriented x 3. Normal mood.     Labs on Admission: I have personally reviewed following labs and imaging studies  CBC:  Recent Labs Lab 06/17/16 0941  WBC 9.4  NEUTROABS 7.3  HGB 14.5  HCT 41.8  MCV 89.5  PLT 99991111   Basic Metabolic Panel:  Recent Labs Lab 06/17/16 0941  NA 137  K 4.8  CL 99*  CO2 28  GLUCOSE 130*  BUN 12  CREATININE 0.72  CALCIUM 9.9   GFR: Estimated Creatinine Clearance: 62.4 mL/min (by C-G formula based on SCr of 0.72 mg/dL). Liver Function Tests:  No results for input(s): AST, ALT, ALKPHOS, BILITOT, PROT, ALBUMIN in the last 168 hours. No results for input(s): LIPASE, AMYLASE in the last 168 hours. No results for input(s): AMMONIA in the last 168 hours. Coagulation Profile: No results for input(s): INR, PROTIME in the last 168 hours. Cardiac Enzymes:  Recent Labs Lab 06/17/16 0941  TROPONINI <0.03   BNP (last 3 results) No results for input(s): PROBNP in the last 8760 hours. HbA1C: No results for input(s): HGBA1C in the last 72 hours. CBG:  Recent Labs Lab 06/17/16 1053  GLUCAP 180*   Lipid Profile: No results for input(s): CHOL, HDL, LDLCALC, TRIG, CHOLHDL, LDLDIRECT in the last 72 hours. Thyroid Function Tests: No results for input(s): TSH, T4TOTAL, FREET4, T3FREE, THYROIDAB in the last 72 hours. Anemia Panel: No results for input(s): VITAMINB12, FOLATE, FERRITIN, TIBC, IRON, RETICCTPCT in the last 72 hours. Urine analysis: No results found for: COLORURINE, APPEARANCEUR, LABSPEC, PHURINE, GLUCOSEU, HGBUR, BILIRUBINUR, KETONESUR, PROTEINUR, UROBILINOGEN, NITRITE, LEUKOCYTESUR Sepsis Labs: !!!!!!!!!!!!!!!!!!!!!!!!!!!!!!!!!!!!!!!!!!!! @LABRCNTIP (procalcitonin:4,lacticidven:4) )No results found for this or any previous visit (from the past 240 hour(s)).   Radiological  Exams on Admission: Dg Chest 2 View  Result Date: 06/17/2016 CLINICAL DATA:  Chest pain and hypertension over the last 2 days. EXAM: CHEST  2 VIEW COMPARISON:  06/18/2013 FINDINGS: Heart size is normal. There is aortic atherosclerosis. There is pleural and parenchymal scarring of both lung apices. Extensive artifact overlies the chest. Question small area patchy density at the right lung base. No other focal pulmonary finding. No effusions. Old rib fractures on the left. IMPRESSION: Aortic atherosclerosis. Question small area patchy density at the right lung base. Extensive overlying artifact in this region. This could be artifactual, represent mild scarring or possibly be a small infiltrate or mass. Repeat imaging recommended when able without the overlying material. Electronically Signed   By: Nelson Chimes M.D.   On: 06/17/2016 09:47   Ct Head Wo Contrast  Result Date: 06/17/2016 CLINICAL DATA:  Headache, nausea and presyncopal symptoms. Changed blood pressure medications yesterday. EXAM: CT HEAD WITHOUT CONTRAST TECHNIQUE: Contiguous axial images were obtained from the base of the skull through the vertex without intravenous contrast. COMPARISON:  None. FINDINGS: Brain: Diffusely enlarged ventricles and subarachnoid spaces. Minimal white matter low density in both cerebral hemispheres. Small, old left corona radiata and bilateral basal ganglia lacunar infarcts. No intracranial hemorrhage, mass lesion or CT evidence of acute infarction. Vascular: No hyperdense vessel or unexpected calcification. Skull: Normal. Negative for fracture or focal lesion. Sinuses/Orbits: No acute finding. Other: None. IMPRESSION: 1. No acute abnormality. 2. Mild to moderate diffuse cerebral and cerebellar atrophy. 3. Minimal chronic small vessel white matter ischemic changes in both cerebral hemispheres. 4. 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 Electronically Signed   By: Claudie Revering M.D.   On:  06/17/2016 10:31    EKG: Independently reviewed.   Assessment/Plan Active Problems:   Chest pain   Hypertensive urgency   Coronary artery disease   Diabetes mellitus ([place])   1.  Chest pain. Mrs Tesoriero is a 75 year old female with a history of hypertension, diabetes, having history of coronary artery disease with percutaneous intervention to LAD in 2001, presenting with complaints of chest pain that started this morning. Symptoms resolving with aspirin. Troponin negative and EKG did not reveal acute ischemic changes. I discussed case with her cardiologist Dr. Harl Bowie who agreed with overnight observation on continuous cardiac monitoring, trending troponins, addressing hypertension.    2.  Hypertensive urgency. She  presents with elevated blood pressures of 249/75. She was given 5 mg of IV hydralazine in the emergency department. I spoke with her cardiologist who recommended increasing hydralazine to 50 mg by mouth 3 times a day and providing as needed IV labetalol for systolic blood pressures greater than 165. It sounds like she has had intolerance to multiple antihypertensive agents and developed angioedema to ACE inhibitors.   3. History of peripheral internal disease. Status post iliac stent in July 2001. Denies extremity pain.  4.  Dyslipidemia. Continue Crestor  5.  Insulin-dependent diabetes mellitus. Continue  her home regimen of Lantus at 40 units subcutaneous at bedtime and 10 units in the morning. Plan to hold metformin for now.                                              DVT prophylaxis: Lovenox Code Status: Full code Family Communication:  Disposition Plan: Placed in overnight observation Consults called: I spoke with Dr. Harl Bowie of cardiology  Admission status: Observation   Kelvin Cellar MD [facility] Pager [phone]  If 7PM-7AM, please contact night-coverage [email or web address] Password [email or web address]  06/17/2016, 12:20 PM

## 2016-06-18 ENCOUNTER — Observation Stay (HOSPITAL_BASED_OUTPATIENT_CLINIC_OR_DEPARTMENT_OTHER): Payer: Medicare Other

## 2016-06-18 DIAGNOSIS — I160 Hypertensive urgency: Secondary | ICD-10-CM | POA: Diagnosis not present

## 2016-06-18 DIAGNOSIS — R079 Chest pain, unspecified: Secondary | ICD-10-CM | POA: Diagnosis not present

## 2016-06-18 DIAGNOSIS — R0789 Other chest pain: Secondary | ICD-10-CM | POA: Diagnosis not present

## 2016-06-18 LAB — BASIC METABOLIC PANEL
ANION GAP: 8 (ref 5–15)
BUN: 13 mg/dL (ref 6–20)
CO2: 28 mmol/L (ref 22–32)
Calcium: 9.3 mg/dL (ref 8.9–10.3)
Chloride: 102 mmol/L (ref 101–111)
Creatinine, Ser: 0.7 mg/dL (ref 0.44–1.00)
GFR calc Af Amer: >60 mL/min (ref >60)
GLUCOSE: 163 mg/dL — AB (ref 65–99)
POTASSIUM: 4.3 mmol/L (ref 3.5–5.1)
Sodium: 138 mmol/L (ref 135–145)

## 2016-06-18 LAB — GLUCOSE, CAPILLARY
GLUCOSE-CAPILLARY: 308 mg/dL — AB (ref 65–99)
Glucose-Capillary: 179 mg/dL — ABNORMAL HIGH (ref 65–99)

## 2016-06-18 LAB — CBC
HEMATOCRIT: 39.7 % (ref 36.0–46.0)
HEMOGLOBIN: 13.5 g/dL (ref 12.0–15.0)
MCH: 30.6 pg (ref 26.0–34.0)
MCHC: 34 g/dL (ref 30.0–36.0)
MCV: 90 fL (ref 78.0–100.0)
PLATELETS: 280 10*3/uL (ref 150–400)
RBC: 4.41 MIL/uL (ref 3.87–5.11)
RDW: 13.7 % (ref 11.5–15.5)
WBC: 7.6 10*3/uL (ref 4.0–10.5)

## 2016-06-18 LAB — LIPID PANEL
Cholesterol: 99 mg/dL (ref 0–200)
HDL: 35 mg/dL — ABNORMAL LOW (ref >40)
LDL CALC: 30 mg/dL (ref 0–99)
TRIGLYCERIDES: 169 mg/dL — AB (ref <150)
Total CHOL/HDL Ratio: 2.8 RATIO
VLDL: 34 mg/dL (ref 0–40)

## 2016-06-18 LAB — ECHOCARDIOGRAM COMPLETE
HEIGHTINCHES: 65 in
WEIGHTICAEL: 2606.4 [oz_av]

## 2016-06-18 MED ORDER — ISOSORBIDE MONONITRATE 20 MG PO TABS: 10.0000 mg | ORAL_TABLET | Freq: Once | ORAL | Status: DC

## 2016-06-18 MED ORDER — ISOSORBIDE MONONITRATE ER 30 MG PO TB24
30.0000 mg | ORAL_TABLET | Freq: Every day | ORAL | Status: DC
  Administered 2016-06-18: 30 mg via ORAL
  Filled 2016-06-18: qty 1

## 2016-06-18 MED ORDER — DIPHENHYDRAMINE HCL 50 MG/ML IJ SOLN
25.0000 mg | Freq: Four times a day (QID) | INTRAMUSCULAR | Status: DC | PRN
  Administered 2016-06-18: 25 mg via INTRAVENOUS
  Filled 2016-06-18: qty 1

## 2016-06-18 MED ORDER — NITROGLYCERIN 0.4 MG SL SUBL: 0.4000 mg | SUBLINGUAL_TABLET | SUBLINGUAL | Status: DC | PRN

## 2016-06-18 MED ORDER — HYDRALAZINE HCL 25 MG PO TABS
75.0000 mg | ORAL_TABLET | Freq: Three times a day (TID) | ORAL | Status: DC
  Administered 2016-06-18 (×2): 75 mg via ORAL
  Filled 2016-06-18 (×2): qty 3

## 2016-06-18 MED ORDER — ISOSORBIDE MONONITRATE ER 60 MG PO TB24: 30.0000 mg | ORAL_TABLET | Freq: Every day | ORAL | Status: DC

## 2016-06-18 MED ORDER — HYDRALAZINE HCL 25 MG PO TABS: 75.0000 mg | ORAL_TABLET | Freq: Three times a day (TID) | ORAL | 1 refills | Status: DC

## 2016-06-18 MED ORDER — HYDRALAZINE HCL 20 MG/ML IJ SOLN: 10.0000 mg | Freq: Once | INTRAMUSCULAR | Status: DC

## 2016-06-18 MED ORDER — NITROGLYCERIN 0.4 MG SL SUBL
0.4000 mg | SUBLINGUAL_TABLET | Freq: Once | SUBLINGUAL | Status: AC
  Administered 2016-06-18: 0.4 mg via SUBLINGUAL
  Filled 2016-06-18: qty 1

## 2016-06-18 MED ORDER — ISOSORBIDE MONONITRATE ER 30 MG PO TB24: 30.0000 mg | ORAL_TABLET | Freq: Every day | ORAL | 1 refills | Status: DC

## 2016-06-18 MED ORDER — ZOLPIDEM TARTRATE 5 MG PO TABS
5.0000 mg | ORAL_TABLET | Freq: Every evening | ORAL | Status: DC | PRN
  Administered 2016-06-18: 5 mg via ORAL
  Filled 2016-06-18: qty 1

## 2016-06-18 NOTE — Progress Notes (Signed)
Inpatient Diabetes Program Recommendations  AACE/ADA: New Consensus Statement on Inpatient Glycemic Control (2015)  Target Ranges:  Prepandial:   less than 140 mg/dL      Peak postprandial:   less than 180 mg/dL (1-2 hours)      Critically ill patients:  140 - 180 mg/dL   Lab Results  Component Value Date   GLUCAP 179 (H) 06/18/2016   HGBA1C 8.9 01/26/2016    Review of Glycemic Control  Results for [name] [name], [name] [name] (MRN [id number]) as of 06/18/2016 08:35  Ref. Range 06/17/2016 10:53 06/17/2016 14:17 06/17/2016 18:00 06/17/2016 21:10 06/18/2016 08:02  Glucose-Capillary Latest Ref Range: 65 - 99 mg/dL 180 (H) 154 (H) 292 (H) 225 (H) 179 (H)    Diabetes history: Type 2 Outpatient Diabetes medications: Lantus 10 units qam, 40 units qpm, Humalog 10-16 units tid with meals, Glumetza 1000mg  bid  Current orders for Inpatient glycemic control: Lantus 10 units qam, 40 units qpm, Novolog moderate correction scale (0-15 units) tid with meals, Novolog 0-5 units qhs  Inpatient Diabetes Program Recommendations: Please consider adding Novolog 5 units tid with meals (hold if patient eats less than 50%).  Continue Novolog correction as ordered.   Gentry Fitz, RN, BA, MHA, CDE Diabetes Coordinator Inpatient Diabetes Program  [phone] (Team Pager) [phone] ([place]) 06/18/2016 8:41 AM

## 2016-06-18 NOTE — Progress Notes (Signed)
Pt's IV catheter removed and intact. Pt's IV site clean dry and intact. Discharge instructions including medications and follow up appointments were reviewed and discussed with patient. All questions were answered and no further questions at this time. Pt verbalized understanding of discharge instructions including medications and follow up appointments. Pt in stable condition and in no acute distress at time of discharge. Pt escorted by nurse tech.  

## 2016-06-18 NOTE — Care Management Note (Signed)
Case Management Note  Patient Details  Name: [name] [name] MRN: [id number] Date of Birth: [birth year]  Subjective/Objective:                  Admitted with hypertension. Pt is from home. She is ind with ADL's. She has PCP and cardiologist. HH offered for BP monitoring and hospital f/u. She states she is not interested at this time. She has a BP machine at home that is consistent with the MD office checks.   Action/Plan: Pt discharging home today with self care.   Expected Discharge Date:  06/18/16               Expected Discharge Plan:  Home/Self Care  In-House Referral:  NA  Discharge planning Services  CM Consult  Post Acute Care Choice:  NA Choice offered to:  NA  DME Arranged:    DME Agency:     HH Arranged:    HH Agency:     Status of Service:  Completed, signed off  If discussed at [employer] of Stay Meetings, dates discussed:    Additional Comments:  Sherald Barge, RN 06/18/2016, 11:34 AM

## 2016-06-18 NOTE — Progress Notes (Signed)
  Echocardiogram 2D Echocardiogram has been performed.  Darlina Sicilian M 06/18/2016, 1:52 PM

## 2016-06-18 NOTE — Discharge Summary (Addendum)
Physician Discharge Summary  [name] [name] [id number] DOB: 05-31-1941 DOA: 06/17/2016  PCP: Glenda Chroman, MD  Admit date: 06/17/2016 Discharge date: 06/18/2016  Time spent: 35 minutes  Recommendations for Outpatient Follow-up:  1. Please follow-up on blood pressures, she was admitted for hypertensive urgency, during this hospitalization her hydralazine was increased to 75 mg by mouth 3 times a day where previously had been on 50 mg by mouth twice a day, and was started on Imdur 30 mg PO q daily. Her blood pressures have been challenging to control particularly with multiple antihypertensive agents intolerances. I instructed her to follow up at her Cardiologist office this coming Monday for a blood pressure check.  2. Please follow up on 2D echo, performed on 06/18/2016 results pending.   Discharge Diagnoses:  Active Problems:   Chest pain   Hypertensive urgency   Coronary artery disease   Diabetes mellitus ([place])   Discharge Condition: Stable  Diet recommendation: Heart healthy  Filed Weights   06/17/16 0854 06/17/16 1755  Weight: 74.8 kg (165 lb) 73.9 kg (162 lb 14.4 oz)    History of present illness:  HPI: [name] [name] is a 75 y.o. female with a past medical history of hypertension, insulin-dependent diabetes mellitus, coronary artery disease, presented to the emergency department with complaints of chest pain. She reports chest pain started this morning after taking a walk, located in the retrosternal area characterized as pressure-like, nonradiating, not associate with shortness of breath, nausea, vomiting. She states that her blood pressures have been difficult to control lately and has been seeing Dr Harl Bowie of cardiology. She has not tolerated chlorthalidone, clonidine, Norvasc and developed angioedema with ACE inhibitors. Her cardiologist recently increased hydralazine dose to 50 mg by mouth twice a day.   Hospital Course:  [name] [name] is a pleasant 75 year old female with  a past medical history of hypertension, insulin dependent diabetes mellitus, admitted to the medicine service on 06/17/2016 when she presented with complaints of chest pain. On presentation she was found to be hypertensive with a blood pressure of 249/75. It appears that she has had intolerance to multiple antihypertensive agents in the outpatient setting. Records also indicating she developed angioedema to ACE inhibitors. She was brought in for overnight observation with cycling of troponins remaining negative x 3 sets. Suspect chest pain related to hypertension. Systolic blood pressures coming down to 170 by the following morning. Case was discussed with her primary cardiologist Dr.Branch who recommended increasing hydralazine dose to 75 mg by mouth 3 times a day. Cardiology did not recommend further cardiac workups at this time. Given clinical stability, not having further episodes of chest pain. Controlling blood pressures continued to be a challenge having intolerances/allergies to multiple antihypertensives. She seemed to be tolerating Imdur and Hydralazine. Please follow up on her blood pressures.   Consultations:  Cardiology  Discharge Exam: Vitals:   06/18/16 0325 06/18/16 0400  BP: (!) 166/80 (!) 170/60  Pulse:  68  Resp:  18  Temp:  98.1 F (36.7 C)    General: Nontoxic appearing, awake and alert, anxious to go home Cardiovascular: Regular rate and rhythm normal S1-S2 Respiratory: Normal respiratory effort, lungs are clear to auscultation bilaterally Abdomen: Soft nontender nondistended Extremities: No edema  Discharge Instructions   Discharge Instructions    Call MD for:    Complete by:  As directed    Call MD for:  difficulty breathing, headache or visual disturbances    Complete by:  As directed  Call MD for:  extreme fatigue    Complete by:  As directed    Call MD for:  hives    Complete by:  As directed    Call MD for:  persistant dizziness or light-headedness     Complete by:  As directed    Call MD for:  persistant nausea and vomiting    Complete by:  As directed    Call MD for:  redness, tenderness, or signs of infection (pain, swelling, redness, odor or green/yellow discharge around incision site)    Complete by:  As directed    Call MD for:  severe uncontrolled pain    Complete by:  As directed    Call MD for:  temperature >100.4    Complete by:  As directed    Diet - low sodium heart healthy    Complete by:  As directed    Increase activity slowly    Complete by:  As directed      Current Discharge Medication List    CONTINUE these medications which have CHANGED   Details  hydrALAZINE (APRESOLINE) 25 MG tablet Take 3 tablets (75 mg total) by mouth 3 (three) times daily. Qty: 90 tablet, Refills: 1      CONTINUE these medications which have NOT CHANGED   Details  aspirin 81 MG tablet Take 81 mg by mouth at bedtime.     betamethasone dipropionate (DIPROLENE) 0.05 % cream Apply 1 application topically 2 (two) times daily.    bimatoprost (LUMIGAN) 0.01 % SOLN Place 1 drop into both eyes at bedtime.    busPIRone (BUSPAR) 5 MG tablet Take 0.5 tablets (2.5 mg total) by mouth daily. Qty: 15 tablet, Refills: 3    Cholecalciferol (VITAMIN D3) 2000 UNITS TABS Take 1 tablet by mouth daily.     diclofenac (VOLTAREN) 75 MG EC tablet Take 75 mg by mouth at bedtime.     ENDOCET 10-325 MG per tablet Take 1 tablet by mouth 2 (two) times daily.     insulin glargine (LANTUS) 100 UNIT/ML injection Inject 0.4 mLs (40 Units total) into the skin at bedtime. Qty: 15 mL, Refills: 2    insulin lispro (HUMALOG) 100 UNIT/ML injection Inject 10-16 Units into the skin 3 (three) times daily with meals.    metFORMIN (GLUMETZA) 1000 MG (MOD) 24 hr tablet Take 1,000 mg by mouth 2 (two) times daily.    omeprazole (PRILOSEC) 20 MG capsule Take 20 mg by mouth 2 (two) times daily before a meal.     polyethylene glycol (MIRALAX / GLYCOLAX) packet Take 17 g by  mouth daily as needed for moderate constipation.     rosuvastatin (CRESTOR) 10 MG tablet Take 10 mg by mouth at bedtime.     SYMBICORT 160-4.5 MCG/ACT inhaler Inhale 2 puffs into the lungs 2 (two) times daily.     ONE TOUCH ULTRA TEST test strip        Allergies  Allergen Reactions  . Clonidine Derivatives Other (See Comments)    dizziness  . Lisinopril Other (See Comments)    Tongue swelling  . Losartan     TONGUE SWELLING  . Codeine Nausea And Vomiting  . Hydrocodone Nausea And Vomiting   Follow-up Information    Carlyle Dolly, MD Follow up in 1 week(s).   Specialty:  Cardiology Contact information: [address] [place] [address] [phone]        Glenda Chroman, MD Follow up in 1 week(s).   Specialty:  Internal Medicine Contact information: [address] [place] [address] [phone]            The results of significant diagnostics from this hospitalization (including imaging, microbiology, ancillary and laboratory) are listed below for reference.    Significant Diagnostic Studies: Dg Chest 2 View  Result Date: 06/17/2016 CLINICAL DATA:  Chest pain and hypertension over the last 2 days. EXAM: CHEST  2 VIEW COMPARISON:  06/18/2013 FINDINGS: Heart size is normal. There is aortic atherosclerosis. There is pleural and parenchymal scarring of both lung apices. Extensive artifact overlies the chest. Question small area patchy density at the right lung base. No other focal pulmonary finding. No effusions. Old rib fractures on the left. IMPRESSION: Aortic atherosclerosis. Question small area patchy density at the right lung base. Extensive overlying artifact in this region. This could be artifactual, represent mild scarring or possibly be a small infiltrate or mass. Repeat imaging recommended when able without the overlying material. Electronically Signed   By: Nelson Chimes M.D.   On: 06/17/2016 09:47   Ct Head Wo Contrast  Result Date: 06/17/2016 CLINICAL  DATA:  Headache, nausea and presyncopal symptoms. Changed blood pressure medications yesterday. EXAM: CT HEAD WITHOUT CONTRAST TECHNIQUE: Contiguous axial images were obtained from the base of the skull through the vertex without intravenous contrast. COMPARISON:  None. FINDINGS: Brain: Diffusely enlarged ventricles and subarachnoid spaces. Minimal white matter low density in both cerebral hemispheres. Small, old left corona radiata and bilateral basal ganglia lacunar infarcts. No intracranial hemorrhage, mass lesion or CT evidence of acute infarction. Vascular: No hyperdense vessel or unexpected calcification. Skull: Normal. Negative for fracture or focal lesion. Sinuses/Orbits: No acute finding. Other: None. IMPRESSION: 1. No acute abnormality. 2. Mild to moderate diffuse cerebral and cerebellar atrophy. 3. Minimal chronic small vessel white matter ischemic changes in both cerebral hemispheres. 4. 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 Electronically Signed   By: Claudie Revering M.D.   On: 06/17/2016 10:31    Microbiology: No results found for this or any previous visit (from the past 240 hour(s)).   Labs: Basic Metabolic Panel:  Recent Labs Lab 06/17/16 0941 06/18/16 0641  NA 137 138  K 4.8 4.3  CL 99* 102  CO2 28 28  GLUCOSE 130* 163*  BUN 12 13  CREATININE 0.72 0.70  CALCIUM 9.9 9.3   Liver Function Tests: No results for input(s): AST, ALT, ALKPHOS, BILITOT, PROT, ALBUMIN in the last 168 hours. No results for input(s): LIPASE, AMYLASE in the last 168 hours. No results for input(s): AMMONIA in the last 168 hours. CBC:  Recent Labs Lab 06/17/16 0941 06/18/16 0641  WBC 9.4 7.6  NEUTROABS 7.3  --   HGB 14.5 13.5  HCT 41.8 39.7  MCV 89.5 90.0  PLT 267 280   Cardiac Enzymes:  Recent Labs Lab 06/17/16 0941 06/17/16 1716 06/17/16 2000 06/17/16 2306  TROPONINI <0.03 <0.03 <0.03 <0.03   BNP: BNP (last 3 results) No results for input(s): BNP in  the last 8760 hours.  ProBNP (last 3 results) No results for input(s): PROBNP in the last 8760 hours.  CBG:  Recent Labs Lab 06/17/16 1053 06/17/16 1417 06/17/16 1800 06/17/16 2110 06/18/16 0802  GLUCAP 180* 154* 292* 225* 179*       Signed:  Kelvin Cellar MD.  [facility] 06/18/2016, 10:00 AM

## 2016-06-18 NOTE — Care Management Obs Status (Signed)
[place] NOTIFICATION   Patient Details  Name: [name] [name] MRN: [id number] Date of Birth: 10-10-1940   Medicare Observation Status Notification Given:  No (Pt DC'd <24 hrs)    Sherald Barge, RN 06/18/2016, 11:33 AM

## 2016-06-18 NOTE — Progress Notes (Signed)
Patient well known to me from clinic, discussed with Dr Coralyn Pear, we have been following her bp's peripherally. Bp's difficult to control primarily due to side effects to multiple agents as described in my previous note. Would increase hydralazine to 75mg  tid today. Room to titrate to 100mg  tid, if persistent HTn would try aldactone 25mg  daily. Negative troponins, suspect chest pain due to severe HTN on admission.     Zandra Abts MD

## 2016-06-21 ENCOUNTER — Other Ambulatory Visit: Payer: Self-pay | Admitting: *Deleted

## 2016-06-21 ENCOUNTER — Ambulatory Visit (INDEPENDENT_AMBULATORY_CARE_PROVIDER_SITE_OTHER): Payer: Medicare Other | Admitting: *Deleted

## 2016-06-21 VITALS — BP 162/78 | HR 77

## 2016-06-21 DIAGNOSIS — I10 Essential (primary) hypertension: Secondary | ICD-10-CM | POA: Diagnosis not present

## 2016-06-21 MED ORDER — BUSPIRONE HCL 5 MG PO TABS: 2.5000 mg | ORAL_TABLET | Freq: Every day | ORAL | 3 refills | Status: DC

## 2016-06-21 NOTE — Progress Notes (Signed)
Patient walked into office first thing this morning stating that the hospital told her to come here for BP check.    Her Hydralazine was increased to 75mg  three times a day & Imdur 30mg  daily added.    See BP readings below:  9/8 - 194/80  68 9/9 - 198/83  67 9/10 - 204/90  63 9/11 - 208/79  67  9/12 - 203/73  62  9/13 - 198/70  61  9/14 - 220/80  64 - went to hospital. 9/15 - 177/82  70 - came home.  9/16 - 149/67  84 9/17 - 156/68  66 9/18 - 196/82  67 - this morning before her medication.

## 2016-06-22 NOTE — Progress Notes (Signed)
Have her increase her hydralzine to 100mg  tid, and start aldactone 25mg  daily. She needs BMET in 2 weeks. Submit bp log in 1 week   Zandra Abts MD

## 2016-06-23 MED ORDER — HYDRALAZINE HCL 100 MG PO TABS: 100.0000 mg | ORAL_TABLET | Freq: Three times a day (TID) | ORAL | 6 refills | Status: DC

## 2016-06-23 MED ORDER — SPIRONOLACTONE 25 MG PO TABS: 25.0000 mg | ORAL_TABLET | Freq: Every day | ORAL | 6 refills | Status: DC

## 2016-06-23 NOTE — Progress Notes (Signed)
Patient notified and verbalized understanding.  Will send new prescriptions to [facility] today.  Will mail lab order to home as she will have done at PMD office.  She will also keep log of readings x 1 week.  Patient also c/o itching in her head / scalp & ears.  Has noticed this since she came home from hospital.  She does have OV with her pmd tomorrow to have this addressed.  Will also send back to [place] for notification.  Her Hydralazine was increased so she had already been on this medication.  The Imdur was new, but not sure about that particular reaction to that medication.  Again, will fwd back to Dr. Harl Bowie for further advice.

## 2016-06-24 NOTE — Progress Notes (Signed)
We will see what her primary thinks about it at there visit. Can we contact her and see what changes they made?   Zandra Abts MD

## 2016-06-28 ENCOUNTER — Telehealth: Payer: Self-pay | Admitting: Cardiology

## 2016-06-28 NOTE — Telephone Encounter (Signed)
Did see family doctor on Friday.  Was told to take Benadryl & contact our office back because BP still to high.  Only have these symptoms while lying down, prickling feeling in scalp, ears, neck.  Does not do during the daytime or when she is sitting up.   171/76  65 - this morning.   Did make recent changes as suggested.    Was on Chlorthalidone, but did make her a little dizzy.  BP was better on this medication.

## 2016-06-28 NOTE — Telephone Encounter (Signed)
Patient notified.  Scheduled for Wednesday, 06/30/2016 at 10:00 with Dr. Harl Bowie.

## 2016-06-28 NOTE — Telephone Encounter (Signed)
Can she seem me Thurs 340pm. I'd like to go through everything in detail to decide on the best options for her bp meds   Zandra Abts MD

## 2016-06-28 NOTE — Telephone Encounter (Signed)
hydrALAZINE (APRESOLINE) 100 MG tablet   Patient called to state she is having an reaction to this medication.   Symptoms are pricking of needles in hair, neck and hot sensation  Has been up since 1 am and cant sleep.

## 2016-06-30 ENCOUNTER — Ambulatory Visit (INDEPENDENT_AMBULATORY_CARE_PROVIDER_SITE_OTHER): Payer: Medicare Other | Admitting: Cardiology

## 2016-06-30 ENCOUNTER — Encounter: Payer: Self-pay | Admitting: Cardiology

## 2016-06-30 VITALS — BP 186/66 | HR 60 | Ht 65.0 in | Wt 166.0 lb

## 2016-06-30 DIAGNOSIS — I10 Essential (primary) hypertension: Secondary | ICD-10-CM | POA: Diagnosis not present

## 2016-06-30 MED ORDER — CHLORTHALIDONE 25 MG PO TABS: 12.0000 mg | ORAL_TABLET | Freq: Every day | ORAL | 3 refills | Status: DC

## 2016-06-30 MED ORDER — HYDRALAZINE HCL 25 MG PO TABS: 25.0000 mg | ORAL_TABLET | Freq: Two times a day (BID) | ORAL | 3 refills | Status: DC

## 2016-06-30 NOTE — Patient Instructions (Signed)
Your physician recommends that you schedule a follow-up appointment in: 2 MONTHS WITH DR. [place] physician has recommended you make the following change in your medication:   DECREASE HYDRALAZINE 25 MG TWICE DAILY  START CHLORTHALIDONE 12.5 MG DAILY  Your physician has requested that you regularly monitor and record your blood pressure readings at home FOR 1 WEEK AND CALL us WITH YOUR READINGS. Please use the same machine at the same time of day to check your readings and record them to bring to your follow-up visit.  Thank you for choosing [facility]!!

## 2016-06-30 NOTE — Progress Notes (Signed)
Clinical Summary Ms. Olazabal is a 75 y.o.female seen today for follow up of the following medical problems. This is a focused visit on her history of HTN.   1. HTN - history of angioedema on ACE-I and most recently on ARB.  - she reports amlodopine caused leg swelling. Clonidine caused dizziness.  - we hadstarted chlrothalidone 72m daily. She had some muscle cramping, and the dose was decreased to 249mdaily which resolved these symptoms. - dizziness after starting hydralazine 2583mid, she has cut back to 12.5mg32md. '  - compliant with meds. On hydralazine since Feb 19 2016, though recent dose increase. Imdur started during recent admission. Aldactone started 06/21/16, after she had itchy skin. ACE-I/ARB allergy, swelling on norvasc. Orthsostatic dizziness on chlorthatlidone 25mg45mly. Borderline low HRs, have not used beta blocker.   - recent symptoms of head tingling with laying down. Pins and needles in ears and neck. She thinks started in May around time she started hydralayzine, worst with recent increased doses of hydralazine.       SH: 2 children, 4 grandchildren. Plans for trip to [place].  Past Medical History:  Diagnosis Date  . 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 Diabetes mellitus   . Hypercholesterolemia   . Hypertension   . Peripheral vascular disease ([facility]) [place] status post iliac stent July 2001      Allergies  Allergen Reactions  . Clonidine Derivatives Other (See Comments)    dizziness  . Lisinopril Other (See Comments)    Tongue swelling  . Losartan     TONGUE SWELLING  . Codeine Nausea And Vomiting  . Hydrocodone Nausea And Vomiting     Current Outpatient Prescriptions  Medication Sig Dispense Refill  . aspirin 81 MG tablet Take 81 mg by mouth at bedtime.     . betamethasone dipropionate (DIPROLENE) 0.05 % cream Apply 1 application  topically 2 (two) times daily.    . bimatoprost (LUMIGAN) 0.01 % SOLN Place 1 drop into both eyes at bedtime.    . busPIRone (BUSPAR) 5 MG tablet Take 0.5 tablets (2.5 mg total) by mouth at bedtime. 15 tablet 3  . Cholecalciferol (VITAMIN D3) 2000 UNITS TABS Take 1 tablet by mouth daily.     . diclofenac (VOLTAREN) 75 MG EC tablet Take 75 mg by mouth at bedtime.     . ENDOCET 10-325 MG per tablet Take 1 tablet by mouth 2 (two) times daily.     . hydrALAZINE (APRESOLINE) 100 MG tablet Take 1 tablet (100 mg total) by mouth 3 (three) times daily. 90 tablet 6  . insulin glargine (LANTUS) 100 UNIT/ML injection Inject 0.4 mLs (40 Units total) into the skin at bedtime. (Patient taking differently: Inject 10-40 Units into the skin 2 (two) times daily. 10 units in the morning and 40 units in the evening.) 15 mL 2  . insulin lispro (HUMALOG) 100 UNIT/ML injection Inject 10-16 Units into the skin 3 (three) times daily with meals.    . isosorbide mononitrate (IMDUR) 30 MG 24 hr tablet Take 1 tablet (30 mg total) by mouth daily. 30 tablet 1  . metFORMIN (GLUMETZA) 1000 MG (MOD) 24 hr tablet Take 1,000 mg by mouth 2 (two) times daily.    . omeMarland Kitchenrazole (PRILOSEC) 20 MG capsule Take 20 mg by mouth 2 (two) times daily before a meal.     .  ONE TOUCH ULTRA TEST test strip     . polyethylene glycol (MIRALAX / GLYCOLAX) packet Take 17 g by mouth daily as needed for moderate constipation.     . rosuvastatin (CRESTOR) 10 MG tablet Take 10 mg by mouth at bedtime.     [name] spironolactone (ALDACTONE) 25 MG tablet Take 1 tablet (25 mg total) by mouth daily. 30 tablet 6  . SYMBICORT 160-4.5 MCG/ACT inhaler Inhale 2 puffs into the lungs 2 (two) times daily.      No current facility-administered medications for this visit.      Past Surgical History:  Procedure Laterality Date  . CERVICAL FUSION    . CHOLECYSTECTOMY    . CORONARY STENT PLACEMENT    . stent in r leg       Allergies  Allergen Reactions  . Clonidine  Derivatives Other (See Comments)    dizziness  . Lisinopril Other (See Comments)    Tongue swelling  . Losartan     TONGUE SWELLING  . Codeine Nausea And Vomiting  . Hydrocodone Nausea And Vomiting      Family History  Problem Relation Age of Onset  . Heart failure Father   . Heart failure Mother   . Diabetes Mother   . COPD Brother      Social History Ms. Will reports that she quit smoking about 20 years ago. Her smoking use included Cigarettes. She started smoking about 54 years ago. She has a 5.00 pack-year smoking history. She has never used smokeless tobacco. Ms. Devlin reports that she does not drink alcohol.   Review of Systems CONSTITUTIONAL: No weight loss, fever, chills, weakness or fatigue.  HEENT: Eyes: No visual loss, blurred vision, double vision or yellow sclerae.No hearing loss, sneezing, congestion, runny nose or sore throat.  SKIN: No rash or itching.  CARDIOVASCULAR: per hpi RESPIRATORY: No shortness of breath, cough or sputum.  GASTROINTESTINAL: No anorexia, nausea, vomiting or diarrhea. No abdominal pain or blood.  GENITOURINARY: No burning on urination, no polyuria NEUROLOGICAL: No headache, dizziness, syncope, paralysis, ataxia, numbness or tingling in the extremities. No change in bowel or bladder control.  MUSCULOSKELETAL: No muscle, back pain, joint pain or stiffness.  LYMPHATICS: No enlarged nodes. No history of splenectomy.  PSYCHIATRIC: No history of depression or anxiety.  ENDOCRINOLOGIC: No reports of sweating, cold or heat intolerance. No polyuria or polydipsia.  [name]   Physical Examination Vitals:   06/30/16 0943  BP: (!) 186/66  Pulse: 60   Vitals:   06/30/16 0943  Weight: 166 lb (75.3 kg)  Height: '5\' 5"'  (1.651 m)    Gen: resting comfortably, no acute distress HEENT: no scleral icterus, pupils equal round and reactive, no palptable cervical adenopathy,  CV: RRR, no m/r/g, no jvd Resp: Clear to auscultation bilaterally GI:  abdomen is soft, non-tender, non-distended, normal bowel sounds, no hepatosplenomegaly MSK: extremities are warm, no edema.  Skin: warm, no rash Neuro:  no focal deficits Psych: appropriate affect   Diagnostic Studies 11/1999 Cath HEMODYNAMIC DATA: Aortic pressure was initially extremely high at 215/92. She  was  given intravenous enalaprilat, intravenous labetalol, and intravenous  nitroglycerin, ultimately bringing her systolic blood pressure down to less  than  160. Left ventricular pressure was 180/17, with a corresponding aortic  pressure of  180/80. There was no aortic valve gradient.  LEFT VENTRICULOGRAM: Wall motion was normal. Ejection fraction was estimated  t  65%. No mitral regurgitation.  ABDOMINAL AORTOGRAM: This revealed patent renal arteries and abdominal aorta.  The  left common iliac artery had a 75% stenosis. The right external iliac artery  had a  40% stenosis.  CORONARY ARTERIOGRAPHY (Right dominant):  1. The left main was normal.  2. The left anterior descending artery had a 25% stenosis in the proximal to  mid  vessel. The distal LAD had a 50% stenosis just after the bifurcation of the  large second diagonal branch. Further down the distal LAD is a focal 80%  stenosis.  3. The left circumflex had a 20% stenosis in the mid vessel. It gave rise to a  small OM-1, a normal sized OM-2 which had a 25% stenosis, and a normal sized  OM-3 which had a 20% stenosis.  4. The right coronary artery had a diffuse 40% stenosis extending from the mid  o  distal vessel. There was a large posterior descending artery which had a  25%,  followed by a 40% stenosis. There were two small posterolateral branches.  IMPRESSIONS:  1. Normal left ventricular systolic function.  2. Peripheral vascular disease, as described.  3. One-vessel coronary artery disease with significant stenosis in the distal  left  anterior descending artery. This  appears to correspond with her EKG changes  and  Cardiolite scan. She has moderate, but non-flow limiting disease in the  right coronary and left circumflex.  PLAN: Percutaneous intervention of the distal LAD, see below.  PTCA PROCEDURAL NOTE: Following the completion of the diagnostic  catheterization,  we opted to proceed with percutaneous intervention. The preexisting 6-French  sheath in the right femoral artery was exchanged over a wire for a 7-French  sheath.  Integrilin and heparin were administered per protocol. We used a 7-French JL-4  guiding catheter and a BMW wire. The reference vessel was approximately 2 mm  in  diameter. We utilized a 2.0 x 20 mm CrossSail balloon, which was inflated  initially to 12 atm x 2 minutes and then 14 atm x 3 minutes. Final  angiographic  images revealed significant improvement in the lumen with 25% residual stenosis  and  a possible very small non-flow limiting dissection. There was TIMI-3 flow into  the  distal vessel.  COMPLICATIONS: None.  RESULTS: Successful percutaneous transluminal coronary angioplasty of the  distal  left anterior descending artery, reducing an 80% stenosis to 25% residual with  TIMI-3 flow.  PLAN: Integrilin will be continued for 20 hours. The patient needs aggressive  blood pressure control and risk factor reduction.  In regards to her peripheral vascular disease, she will be referred for further  evaluation and possible intervention.  03/2012 Echo LVEF 60-65%, no significant abnormalities   02/19/16 Clinic EKG (performed and reviewed in clinic): sinus bradycardia, no ischemic changes      Assessment and Plan  1. HTN - difficult to control, partly due to medication side effects as described above - we will restart chlorthalidone 12.97m daily. Decrease hydralazine to 241mbid - she will update [place] bp's in 1 week        JoArnoldo LenisM.D.

## 2016-07-02 ENCOUNTER — Ambulatory Visit: Payer: Medicare Other | Admitting: Cardiology

## 2016-07-06 ENCOUNTER — Telehealth: Payer: Self-pay | Admitting: *Deleted

## 2016-07-06 NOTE — Telephone Encounter (Signed)
-----   Message from [name], MD sent at 07/01/2016  1:22 PM EDT ----- Labs look good  Zandra Abts MD

## 2016-07-06 NOTE — Telephone Encounter (Signed)
Pt aware - routed to pcp  

## 2016-07-16 ENCOUNTER — Telehealth: Payer: Self-pay | Admitting: Cardiology

## 2016-07-16 MED ORDER — BUSPIRONE HCL 5 MG PO TABS: 2.5000 mg | ORAL_TABLET | Freq: Every day | ORAL | 3 refills | Status: DC

## 2016-07-16 MED ORDER — ISOSORBIDE MONONITRATE ER 30 MG PO TB24: 30.0000 mg | ORAL_TABLET | Freq: Every day | ORAL | 3 refills | Status: DC

## 2016-07-16 NOTE — Telephone Encounter (Signed)
Patient called stating that she was told to contact office with recent BP readings .  She also has questions about her medications.

## 2016-07-16 NOTE — Telephone Encounter (Signed)
Patient used left arm for BP readings:  07/04/16 BP 158/79  HR 67 07/05/16 BP 159/78  HR 70 07/06/16 BP 135/64  HR 72 07/07/16 BP 134/66  HR 69 07/08/16 BP 134/69  HR 59 07/09/16 BP 155/66  HR 64 07/10/16 BP 133/62  HR 70 07/11/16 BP 148/69  HR 69 07/12/16 BP 128/64  HR 69 07/13/16 BP 138/61  HR 62 07/14/16 BP 143/62  HR 57

## 2016-07-19 NOTE — Telephone Encounter (Signed)
Patient notified

## 2016-07-19 NOTE — Telephone Encounter (Signed)
BP overall looks good, I would not make any changes.

## 2016-09-01 ENCOUNTER — Encounter: Payer: Self-pay | Admitting: Cardiology

## 2016-09-01 ENCOUNTER — Ambulatory Visit (INDEPENDENT_AMBULATORY_CARE_PROVIDER_SITE_OTHER): Payer: Medicare Other | Admitting: Cardiology

## 2016-09-01 VITALS — BP 170/69 | HR 56 | Ht 65.0 in | Wt 169.0 lb

## 2016-09-01 DIAGNOSIS — I10 Essential (primary) hypertension: Secondary | ICD-10-CM | POA: Diagnosis not present

## 2016-09-01 MED ORDER — CHLORTHALIDONE 25 MG PO TABS: 12.0000 mg | ORAL_TABLET | ORAL | 3 refills | Status: DC

## 2016-09-01 NOTE — Progress Notes (Signed)
Clinical Summary [name] [name] is a 75 y.o.female seen today for follow up of the following medical problems. This is a focused visit on her history of HTN.   1. HTN - history of angioedema on ACE-I and most recently on ARB.  - she reports amlodopine caused leg swelling. Clonidine caused dizziness.  - we hadstarted chlrothalidone 74m daily. She had some muscle cramping, and the dose was decreased to 232mdaily which resolved these symptoms. - dizziness after starting hydralazine 2516mid, she has cut back to 12.5mg70md. '  - compliant with meds. On hydralazine since Feb 19 2016, though recent dose increase. Imdur started during recent admission. Aldactone started 06/21/16, after she had itchy skin. ACE-I/ARB allergy, swelling on norvasc. Orthsostatic dizziness on chlorthatlidone 25mg66mly. Borderline low HRs, have not used beta blocker.  -  symptoms of head tingling with laying down. Pins and needles in ears and neck. She thinks started in May around time she started hydralayzine, worst with recent increased doses of hydralazine.   - last visit we started chlorhtalidone 12.5mg d31my, decreased hydralazine to 25mg b12mBp log last month 120s/140s/60-70s at that time.  - since starting chlorthalidone has had some leg cramps. No dizziness.  - bp log 120s-150s/50s-60s - reports significant leg cramps recently.     Past Medical History:  Diagnosis Date  . 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 Diabetes mellitus   . Hypercholesterolemia   . Hypertension   . 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 Clonidine Derivatives Other (See Comments)    dizziness  . Lisinopril Other (See Comments)    Tongue swelling  . Losartan     TONGUE SWELLING  . Codeine Nausea And Vomiting  . Hydrocodone Nausea And Vomiting      Current Outpatient Prescriptions  Medication Sig Dispense Refill  . aspirin 81 MG tablet Take 81 mg by mouth at bedtime.     . betamethasone dipropionate (DIPROLENE) 0.05 % cream Apply 1 application topically 2 (two) times daily.    . bimatoprost (LUMIGAN) 0.01 % SOLN Place 1 drop into both eyes at bedtime.    . busPIRone (BUSPAR) 5 MG tablet Take 0.5 tablets (2.5 mg total) by mouth at bedtime. 45 tablet 3  . chlorthalidone (HYGROTON) 25 MG tablet Take 0.5 tablets (12.5 mg total) by mouth daily. 15 tablet 3  . Cholecalciferol (VITAMIN D3) 2000 UNITS TABS Take 1 tablet by mouth daily.     . diclofenac (VOLTAREN) 75 MG EC tablet Take 75 mg by mouth at bedtime.     . ENDOCET 10-325 MG per tablet Take 1 tablet by mouth 2 (two) times daily.     . hydrALAZINE (APRESOLINE) 25 MG tablet Take 1 tablet (25 mg total) by mouth 2 (two) times daily. 60 tablet 3  . insulin glargine (LANTUS) 100 UNIT/ML injection Inject 0.4 mLs (40 Units total) into the skin at bedtime. (Patient taking differently: Inject 10-40 Units into the skin 2 (two) times daily. 10 units in the morning and 40 units in the evening.) 15 mL 2  . insulin lispro (HUMALOG) 100 UNIT/ML injection Inject 10-16 Units into the skin 3 (three) times daily with meals.    . isosorbide mononitrate (IMDUR) 30 MG 24 hr tablet Take 1 tablet (30  mg total) by mouth daily. 90 tablet 3  . metFORMIN (GLUMETZA) 1000 MG (MOD) 24 hr tablet Take 1,000 mg by mouth 2 (two) times daily.    [name] omeprazole (PRILOSEC) 20 MG capsule Take 20 mg by mouth 2 (two) times daily before a meal.     . ONE TOUCH ULTRA TEST test strip     . polyethylene glycol (MIRALAX / GLYCOLAX) packet Take 17 g by mouth daily as needed for moderate constipation.     . rosuvastatin (CRESTOR) 10 MG tablet Take 10 mg by mouth at bedtime.     [name] spironolactone (ALDACTONE) 25 MG tablet Take 1 tablet (25 mg total) by mouth daily. 30 tablet 6  . SYMBICORT 160-4.5 MCG/ACT inhaler Inhale 2 puffs into  the lungs 2 (two) times daily.      No current facility-administered medications for this visit.      Past Surgical History:  Procedure Laterality Date  . CERVICAL FUSION    . CHOLECYSTECTOMY    . CORONARY STENT PLACEMENT    . stent in r leg       Allergies  Allergen Reactions  . Clonidine Derivatives Other (See Comments)    dizziness  . Lisinopril Other (See Comments)    Tongue swelling  . Losartan     TONGUE SWELLING  . Codeine Nausea And Vomiting  . Hydrocodone Nausea And Vomiting      Family History  Problem Relation Age of Onset  . Heart failure Father   . Heart failure Mother   . Diabetes Mother   . COPD Brother      Social History [name] [name] reports that she quit smoking about 20 years ago. Her smoking use included Cigarettes. She started smoking about 54 years ago. She has a 5.00 pack-year smoking history. She has never used smokeless tobacco. [name] [name] reports that she does not drink alcohol.   Review of Systems CONSTITUTIONAL: No weight loss, fever, chills, weakness or fatigue.  HEENT: Eyes: No visual loss, blurred vision, double vision or yellow sclerae.No hearing loss, sneezing, congestion, runny nose or sore throat.  SKIN: No rash or itching.  CARDIOVASCULAR: per HPI RESPIRATORY: No shortness of breath, cough or sputum.  GASTROINTESTINAL: No anorexia, nausea, vomiting or diarrhea. No abdominal pain or blood.  GENITOURINARY: No burning on urination, no polyuria NEUROLOGICAL: No headache, dizziness, syncope, paralysis, ataxia, numbness or tingling in the extremities. No change in bowel or bladder control.  MUSCULOSKELETAL: +leg cramps LYMPHATICS: No enlarged nodes. No history of splenectomy.  PSYCHIATRIC: No history of depression or anxiety.  ENDOCRINOLOGIC: No reports of sweating, cold or heat intolerance. No polyuria or polydipsia.  [name]   Physical Examination Vitals:   09/01/16 0957  BP: (!) 170/69  Pulse: (!) 56   Vitals:   09/01/16 0957   Weight: 169 lb (76.7 kg)  Height: _0  (1.651 m)    Gen: resting comfortably, no acute distress HEENT: no scleral icterus, pupils equal round and reactive, no palptable cervical adenopathy,  CV: RRR, no m/r/g, no jvd. Resp: Clear to auscultation bilaterally GI: abdomen is soft, non-tender, non-distended, normal bowel sounds, no hepatosplenomegaly MSK: extremities are warm, no edema.  Skin: warm, no rash Neuro:  no focal deficits Psych: appropriate affect   Diagnostic Studies  11/1999 Cath HEMODYNAMIC DATA: Aortic pressure was initially extremely high at 215/92. She  was  given intravenous enalaprilat, intravenous labetalol, and intravenous  nitroglycerin, ultimately bringing her systolic blood pressure down to less  than  160. Left  ventricular pressure was 180/17, with a corresponding aortic  pressure of  180/80. There was no aortic valve gradient.  LEFT VENTRICULOGRAM: Wall motion was normal. Ejection fraction was estimated  t  65%. No mitral regurgitation.  ABDOMINAL AORTOGRAM: This revealed patent renal arteries and abdominal aorta.  The  left common iliac artery had a 75% stenosis. The right external iliac artery  had a  40% stenosis.  CORONARY ARTERIOGRAPHY (Right dominant):  1. The left main was normal.  2. The left anterior descending artery had a 25% stenosis in the proximal to  mid  vessel. The distal LAD had a 50% stenosis just after the bifurcation of the  large second diagonal Audriana Aldama. Further down the distal LAD is a focal 80%  stenosis.  3. The left circumflex had a 20% stenosis in the mid vessel. It gave rise to a  small OM-1, a normal sized OM-2 which had a 25% stenosis, and a normal sized  OM-3 which had a 20% stenosis.  4. The right coronary artery had a diffuse 40% stenosis extending from the mid  o  distal vessel. There was a large posterior descending artery which had a  25%,  followed by a 40% stenosis. There were  two small posterolateral branches.  IMPRESSIONS:  1. Normal left ventricular systolic function.  2. Peripheral vascular disease, as described.  3. One-vessel coronary artery disease with significant stenosis in the distal  left  anterior descending artery. This appears to correspond with her EKG changes  and  Cardiolite scan. She has moderate, but non-flow limiting disease in the  right coronary and left circumflex.  PLAN: Percutaneous intervention of the distal LAD, see below.  PTCA PROCEDURAL NOTE: Following the completion of the diagnostic  catheterization,  we opted to proceed with percutaneous intervention. The preexisting 6-French  sheath in the right femoral artery was exchanged over a wire for a 7-French  sheath.  Integrilin and heparin were administered per protocol. We used a 7-French JL-4  guiding catheter and a BMW wire. The reference vessel was approximately 2 mm  in  diameter. We utilized a 2.0 x 20 mm CrossSail balloon, which was inflated  initially to 12 atm x 2 minutes and then 14 atm x 3 minutes. Final  angiographic  images revealed significant improvement in the lumen with 25% residual stenosis  and  a possible very small non-flow limiting dissection. There was TIMI-3 flow into  the  distal vessel.  COMPLICATIONS: None.  RESULTS: Successful percutaneous transluminal coronary angioplasty of the  distal  left anterior descending artery, reducing an 80% stenosis to 25% residual with  TIMI-3 flow.  PLAN: Integrilin will be continued for 20 hours. The patient needs aggressive  blood pressure control and risk factor reduction.  In regards to her peripheral vascular disease, she will be referred for further  evaluation and possible intervention.  03/2012 Echo LVEF 60-65%, no significant abnormalities   02/19/16 Clinic EKG (performed and reviewed in clinic): sinus bradycardia, no ischemic changes    Assessment and Plan  1.  HTN - difficult to control, mainly due to medication side effects as described above - current issue is leg cramps on chlorthalidone. We will try taking 12.11m every other day, check BMET/Mg levels. - bp log shows reasonable control, continue to monitor home bps. Submit bp log in 1 week  F/u 3 months     JArnoldo Lenis M.D.

## 2016-09-01 NOTE — Patient Instructions (Signed)
Medication Instructions:   Your physician has recommended you make the following change in your medication:   Decrease chlorthalidone to 12.5 mg every other day.  Continue all other medications the same.  Labwork:  Your physician recommends that you have lab work today to check your BMET and Mg levels.  Testing/Procedures:  NONE  Follow-Up:  Your physician recommends that you schedule a follow-up appointment in: 3 months  Any Other Special Instructions Will Be Listed Below (If Applicable).  Please call or drop off your blood pressure log after taking your blood pressure for one week of the medication change.  If you need a refill on your cardiac medications before your next appointment, please call your pharmacy.

## 2016-09-10 ENCOUNTER — Telehealth: Payer: Self-pay | Admitting: Cardiology

## 2016-09-10 NOTE — Telephone Encounter (Signed)
The labs from 09/01/16 look like they had already been resulted to her, they look good. I would say overall her bp's are ok given our limitations as far as medications go due to side effects, we will continue to monitor at this time   Zandra Abts MD

## 2016-09-10 NOTE — Telephone Encounter (Signed)
[name] [name] called to report her blood pressure readings and requesting to get her recent lab reports.

## 2016-09-10 NOTE — Telephone Encounter (Signed)
Pt aware of lab results  - BP readings as follows:  147/66  HR 67 151/66        58 145/61        69 144/63        71 151/65        64 159/65        62 135/68        79

## 2016-11-01 ENCOUNTER — Ambulatory Visit (INDEPENDENT_AMBULATORY_CARE_PROVIDER_SITE_OTHER): Payer: Medicare Other | Admitting: Cardiology

## 2016-11-01 ENCOUNTER — Encounter: Payer: Self-pay | Admitting: Cardiology

## 2016-11-01 VITALS — BP 182/67 | HR 72 | Ht 65.0 in | Wt 171.0 lb

## 2016-11-01 DIAGNOSIS — I10 Essential (primary) hypertension: Secondary | ICD-10-CM

## 2016-11-01 MED ORDER — HYDRALAZINE HCL 25 MG PO TABS: 37.5000 mg | ORAL_TABLET | Freq: Three times a day (TID) | ORAL | 1 refills | Status: DC

## 2016-11-01 NOTE — Patient Instructions (Signed)
Your physician recommends that you schedule a follow-up appointment in: 6 weeks with Dr. Harl Bowie.  Your physician has recommended you make the following change in your medication:  Discontinue Aldactone Increase Hydralazine to 37.5mg (1&1/2 tabs) 3x per day.  Your physician has requested that you regularly monitor and record your blood pressure readings at home for 2 weeks. Please call the office with readings. Please use the same machine at the same time of day to check your readings and record them to bring to your follow-up visit.     Thank you for choosing [facility]!!

## 2016-11-01 NOTE — Progress Notes (Signed)
Clinical Summary Ms. Entsminger is a 76 y.o.female presents for a focused visit on her history of HTN.   1. HTN - history of difficult to treat HTN, primarily due to reported medication side effects to several agents.  - history of angioedema on ACE-I and most recently on ARB.  - she reports amlodopine caused leg swelling. Clonidine caused dizziness.  - we had started chlrothalidone 54m daily. She had some muscle cramping, and the dose was decreased to 252mdaily, then 2528mod and then 12.5mg34md.  - dizziness after starting hydralazine, pins and needles feelings in ears with laying down. We have tried continuing this medication.  - she reports alopecia possibly due to aldactone - Borderline low HRs, have not used beta blocker.    - last visit changed chlorthalidone to 12.5mg 72mry other day due to leg cramps. Still having leg cramps, though states occurred prior to chlorthalidone. Hair loss with aldactone she thinks. - home bp's 140s-150s/50-60s     Past Medical History:  Diagnosis Date  . 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 Diabetes mellitus   . Hypercholesterolemia   . Hypertension   . Peripheral vascular disease (HCC) [place] status post iliac stent July 2001      Allergies  Allergen Reactions  . Clonidine Derivatives Other (See Comments)    dizziness  . Lisinopril Other (See Comments)    Tongue swelling  . Losartan     TONGUE SWELLING  . Codeine Nausea And Vomiting  . Hydrocodone Nausea And Vomiting     Current Outpatient Prescriptions  Medication Sig Dispense Refill  . aspirin 81 MG tablet Take 81 mg by mouth at bedtime.     . betamethasone dipropionate (DIPROLENE) 0.05 % cream Apply 1 application topically 2 (two) times daily.    . busPIRone (BUSPAR) 5 MG tablet Take 0.5 tablets (2.5 mg total) by mouth at bedtime. 45 tablet 3  . chlorthalidone (HYGROTON)  25 MG tablet Take 0.5 tablets (12.5 mg total) by mouth every other day. 9 tablet 3  . Cholecalciferol (VITAMIN D3) 2000 UNITS TABS Take 1 tablet by mouth daily.     . ENDOCET 10-325 MG per tablet Take 1 tablet by mouth 2 (two) times daily.     . hydrALAZINE (APRESOLINE) 25 MG tablet Take 1 tablet (25 mg total) by mouth 2 (two) times daily. 60 tablet 3  . insulin glargine (LANTUS) 100 UNIT/ML injection Inject 0.4 mLs (40 Units total) into the skin at bedtime. (Patient taking differently: Inject 10-40 Units into the skin 2 (two) times daily. 10 units in the morning and 40 units in the evening.) 15 mL 2  . insulin lispro (HUMALOG) 100 UNIT/ML injection Inject 10-16 Units into the skin 3 (three) times daily with meals.    . isosorbide mononitrate (IMDUR) 30 MG 24 hr tablet Take 1 tablet (30 mg total) by mouth daily. 90 tablet 3  . metFORMIN (GLUMETZA) 1000 MG (MOD) 24 hr tablet Take 1,000 mg by mouth 2 (two) times daily.    . omeMarland Kitchenrazole (PRILOSEC) 20 MG capsule Take 20 mg by mouth 2 (two) times daily before a meal.     . ONE TOUCH ULTRA TEST test strip     . polyethylene glycol (MIRALAX / GLYCOLAX) packet Take 17 g by mouth daily as needed for moderate constipation.     . rosuvastatin (  CRESTOR) 10 MG tablet Take 10 mg by mouth at bedtime.     [name] spironolactone (ALDACTONE) 25 MG tablet Take 1 tablet (25 mg total) by mouth daily. 30 tablet 6  . SYMBICORT 160-4.5 MCG/ACT inhaler Inhale 2 puffs into the lungs 2 (two) times daily.      No current facility-administered medications for this visit.      Past Surgical History:  Procedure Laterality Date  . CERVICAL FUSION    . CHOLECYSTECTOMY    . CORONARY STENT PLACEMENT    . stent in r leg       Allergies  Allergen Reactions  . Clonidine Derivatives Other (See Comments)    dizziness  . Lisinopril Other (See Comments)    Tongue swelling  . Losartan     TONGUE SWELLING  . Codeine Nausea And Vomiting  . Hydrocodone Nausea And Vomiting       Family History  Problem Relation Age of Onset  . Heart failure Father   . Heart failure Mother   . Diabetes Mother   . COPD Brother      Social History Ms. Boehner reports that she quit smoking about 21 years ago. Her smoking use included Cigarettes. She started smoking about 54 years ago. She has a 5.00 pack-year smoking history. She has never used smokeless tobacco. Ms. Jessee reports that she does not drink alcohol.   Review of Systems CONSTITUTIONAL: No weight loss, fever, chills, weakness or fatigue.  HEENT: Eyes: No visual loss, blurred vision, double vision or yellow sclerae.No hearing loss, sneezing, congestion, runny nose or sore throat.  SKIN: No rash or itching.  CARDIOVASCULAR: per HPI RESPIRATORY: No shortness of breath, cough or sputum.  GASTROINTESTINAL: No anorexia, nausea, vomiting or diarrhea. No abdominal pain or blood.  GENITOURINARY: No burning on urination, no polyuria NEUROLOGICAL: No headache, dizziness, syncope, paralysis, ataxia, numbness or tingling in the extremities. No change in bowel or bladder control.  MUSCULOSKELETAL: No muscle, back pain, joint pain or stiffness.  LYMPHATICS: No enlarged nodes. No history of splenectomy.  PSYCHIATRIC: No history of depression or anxiety.  ENDOCRINOLOGIC: No reports of sweating, cold or heat intolerance. No polyuria or polydipsia.  [name]   Physical Examination Vitals:   11/01/16 1318  BP: (!) 182/67  Pulse: 72   Vitals:   11/01/16 1318  Weight: 171 lb (77.6 kg)  Height: _0  (1.651 m)    Gen: resting comfortably, no acute distress HEENT: no scleral icterus, pupils equal round and reactive, no palptable cervical adenopathy,  CV: RRR, no m/r/g no jvd Resp: Clear to auscultation bilaterally GI: abdomen is soft, non-tender, non-distended, normal bowel sounds, no hepatosplenomegaly MSK: extremities are warm, no edema.  Skin: warm, no rash Neuro:  no focal deficits Psych: appropriate  affect   Diagnostic Studies  11/1999 Cath HEMODYNAMIC DATA: Aortic pressure was initially extremely high at 215/92. She  was  given intravenous enalaprilat, intravenous labetalol, and intravenous  nitroglycerin, ultimately bringing her systolic blood pressure down to less  than  160. Left ventricular pressure was 180/17, with a corresponding aortic  pressure of  180/80. There was no aortic valve gradient.  LEFT VENTRICULOGRAM: Wall motion was normal. Ejection fraction was estimated  t  65%. No mitral regurgitation.  ABDOMINAL AORTOGRAM: This revealed patent renal arteries and abdominal aorta.  The  left common iliac artery had a 75% stenosis. The right external iliac artery  had a  40% stenosis.  CORONARY ARTERIOGRAPHY (Right dominant):  1. The left main was  normal.  2. The left anterior descending artery had a 25% stenosis in the proximal to  mid  vessel. The distal LAD had a 50% stenosis just after the bifurcation of the  large second diagonal Alizah Sills. Further down the distal LAD is a focal 80%  stenosis.  3. The left circumflex had a 20% stenosis in the mid vessel. It gave rise to a  small OM-1, a normal sized OM-2 which had a 25% stenosis, and a normal sized  OM-3 which had a 20% stenosis.  4. The right coronary artery had a diffuse 40% stenosis extending from the mid  o  distal vessel. There was a large posterior descending artery which had a  25%,  followed by a 40% stenosis. There were two small posterolateral branches.  IMPRESSIONS:  1. Normal left ventricular systolic function.  2. Peripheral vascular disease, as described.  3. One-vessel coronary artery disease with significant stenosis in the distal  left  anterior descending artery. This appears to correspond with her EKG changes  and  Cardiolite scan. She has moderate, but non-flow limiting disease in the  right coronary and left circumflex.  PLAN: Percutaneous  intervention of the distal LAD, see below.  PTCA PROCEDURAL NOTE: Following the completion of the diagnostic  catheterization,  we opted to proceed with percutaneous intervention. The preexisting 6-French  sheath in the right femoral artery was exchanged over a wire for a 7-French  sheath.  Integrilin and heparin were administered per protocol. We used a 7-French JL-4  guiding catheter and a BMW wire. The reference vessel was approximately 2 mm  in  diameter. We utilized a 2.0 x 20 mm CrossSail balloon, which was inflated  initially to 12 atm x 2 minutes and then 14 atm x 3 minutes. Final  angiographic  images revealed significant improvement in the lumen with 25% residual stenosis  and  a possible very small non-flow limiting dissection. There was TIMI-3 flow into  the  distal vessel.  COMPLICATIONS: None.  RESULTS: Successful percutaneous transluminal coronary angioplasty of the  distal  left anterior descending artery, reducing an 80% stenosis to 25% residual with  TIMI-3 flow.  PLAN: Integrilin will be continued for 20 hours. The patient needs aggressive  blood pressure control and risk factor reduction.  In regards to her peripheral vascular disease, she will be referred for further  evaluation and possible intervention.  03/2012 Echo LVEF 60-65%, no significant abnormalities   02/19/16 Clinic EKG (performed and reviewed in clinic): sinus bradycardia, no ischemic changes    Assessment and Plan  1. HTN - difficult to control, mainly due to medication side effects as described above - we will stop aldactone due to reported alopecia. Will try increase hydralazine to 37.13m tid - submit bp log in 2 weeks.    F/u 6 weeks.   JArnoldo Lenis M.D

## 2016-11-15 ENCOUNTER — Telehealth: Payer: Self-pay | Admitting: *Deleted

## 2016-11-15 NOTE — Telephone Encounter (Signed)
Im ok with bp's at this time given her issues with medication side effects, overall they are reasonable  Zandra Abts MD

## 2016-11-15 NOTE — Telephone Encounter (Signed)
Called wanting to know if doctor had seen Bp readings yet

## 2016-11-15 NOTE — Telephone Encounter (Signed)
Was told to call office back with log of BP readings:  143/65  75 141/71  74 132/70  77 118/64  80 140/67  61 163/71  67 139/71  72 150/65  64 156/72  63 143/61  68 148/70  63 160/72  65

## 2016-11-15 NOTE — Telephone Encounter (Signed)
Patient notified via voicemail.

## 2016-12-01 ENCOUNTER — Ambulatory Visit: Payer: Medicare Other | Admitting: Cardiology

## 2016-12-16 ENCOUNTER — Telehealth: Payer: Self-pay | Admitting: Cardiology

## 2016-12-16 NOTE — Telephone Encounter (Signed)
Ok to stop imdur and see if symptoms improve  Zandra Abts MD

## 2016-12-16 NOTE — Telephone Encounter (Signed)
Wants to talk about medication isosorbide mononitrate (IMDUR) 30 MG 24 hr tablet   It is causing her to have prickling of skin feeling. She can not tolerate this sensation   Would like to be taken off this medication

## 2016-12-16 NOTE — Telephone Encounter (Signed)
Pt says December feels like ears burning to the point where "feels like torch on ears" along with tingling in the head - says symptoms have gotten worse since December and thinks it coming from isosorbide

## 2016-12-16 NOTE — Telephone Encounter (Signed)
Pt will stop Imdur until upcoming appt with Dr. Harl Bowie next week and discuss at appt

## 2016-12-23 ENCOUNTER — Ambulatory Visit (INDEPENDENT_AMBULATORY_CARE_PROVIDER_SITE_OTHER): Payer: Medicare Other | Admitting: Cardiology

## 2016-12-23 ENCOUNTER — Encounter: Payer: Self-pay | Admitting: Cardiology

## 2016-12-23 VITALS — BP 187/72 | HR 69 | Ht 65.0 in | Wt 173.2 lb

## 2016-12-23 DIAGNOSIS — I10 Essential (primary) hypertension: Secondary | ICD-10-CM | POA: Diagnosis not present

## 2016-12-23 MED ORDER — AMLODIPINE BESYLATE 2.5 MG PO TABS: 2.5000 mg | ORAL_TABLET | Freq: Every day | ORAL | 1 refills | Status: DC

## 2016-12-23 NOTE — Progress Notes (Signed)
Clinical Summary Ms. Rumbaugh is a 76 y.o.female presents for a focused visit on her history of HTN.   1. HTN - history of difficult to treat HTN, primarily due to reported medication side effects to several agents.  - history of angioedema on ACE-I and most recently on ARB.  - she reports amlodopine caused leg swelling (she had been on 66m). Clonidine caused dizziness.  - we had started chlrothalidone 528mdaily. She had some muscle cramping, and the dose was decreased to 2515maily, then 68m75md and then 12.5mg 6m.  - dizziness after starting hydralazine, pins and needles feelings in ears with laying down. We have tried continuing this medication. Same side effect reported on nitrates, discontinued.  - she reports alopecia possibly due to aldactone - Borderline low HRs, have not used beta blocker.    - last visit changed chlorthalidone to 12.5mg e40my other day due to leg cramps. Still having leg cramps, though states occurred prior to chlorthalidone. Hair loss with aldactone she thinks. - home bp's 140s-150s/50-60s   - home bp's 150-170s/60-70s. Side effects on nitrates with tinginlnig in scalp, she has stopped taking.         - compliant with crestor Past Medical History:  Diagnosis Date  . 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 Diabetes mellitus   . Hypercholesterolemia   . Hypertension   . Peripheral vascular disease (HCC)  [place] post iliac stent July 2001      Allergies  Allergen Reactions  . Clonidine Derivatives Other (See Comments)    dizziness  . Lisinopril Other (See Comments)    Tongue swelling  . Losartan     TONGUE SWELLING  . Codeine Nausea And Vomiting  . Hydrocodone Nausea And Vomiting     Current Outpatient Prescriptions  Medication Sig Dispense Refill  . aspirin 81 MG tablet Take 81 mg by mouth at bedtime.     . betamethasone  dipropionate (DIPROLENE) 0.05 % cream Apply 1 application topically 2 (two) times daily.    . Biotin (BIOTIN 5000) 5 MG CAPS Take 1 capsule by mouth daily.    . busPIRone (BUSPAR) 5 MG tablet Take 0.5 tablets (2.5 mg total) by mouth at bedtime. 45 tablet 3  . Calcium Carbonate-Vit D-Min (CALCIUM 1200 PO) Take 1 capsule by mouth daily.    . chlorthalidone (HYGROTON) 25 MG tablet Take 0.5 tablets (12.5 mg total) by mouth every other day. 9 tablet 3  . diclofenac (VOLTAREN) 75 MG EC tablet Take 1 tablet by mouth daily.    . ENDOCET 10-325 MG per tablet Take 1 tablet by mouth 2 (two) times daily.     . hydrALAZINE (APRESOLINE) 25 MG tablet Take 1.5 tablets (37.5 mg total) by mouth 3 (three) times daily. 405 tablet 1  . insulin glargine (LANTUS) 100 UNIT/ML injection Inject 0.4 mLs (40 Units total) into the skin at bedtime. (Patient taking differently: Inject 10-40 Units into the skin 2 (two) times daily. 10 units in the morning and 40 units in the evening.) 15 mL 2  . insulin lispro (HUMALOG) 100 UNIT/ML injection Inject 10-16 Units into the skin 3 (three) times daily with meals.    . isosorbide mononitrate (IMDUR) 30 MG 24 hr tablet Take 1 tablet (30 mg total) by mouth daily. 90 tablet 3  . metFORMIN (GLUMETZA) 1000 MG (MOD) 24 hr tablet  Take 1,000 mg by mouth 2 (two) times daily.    [name] omeprazole (PRILOSEC) 20 MG capsule Take 20 mg by mouth 2 (two) times daily before a meal.     . ONE TOUCH ULTRA TEST test strip     . polyethylene glycol (MIRALAX / GLYCOLAX) packet Take 17 g by mouth daily as needed for moderate constipation.     . rosuvastatin (CRESTOR) 10 MG tablet Take 10 mg by mouth at bedtime.     . SYMBICORT 160-4.5 MCG/ACT inhaler Inhale 2 puffs into the lungs 2 (two) times daily.      No current facility-administered medications for this visit.      Past Surgical History:  Procedure Laterality Date  . CERVICAL FUSION    . CHOLECYSTECTOMY    . CORONARY STENT PLACEMENT    . stent in r  leg       Allergies  Allergen Reactions  . Clonidine Derivatives Other (See Comments)    dizziness  . Lisinopril Other (See Comments)    Tongue swelling  . Losartan     TONGUE SWELLING  . Codeine Nausea And Vomiting  . Hydrocodone Nausea And Vomiting      Family History  Problem Relation Age of Onset  . Heart failure Father   . Heart failure Mother   . Diabetes Mother   . COPD Brother      Social History Ms. Butz reports that she quit smoking about 21 years ago. Her smoking use included Cigarettes. She started smoking about 55 years ago. She has a 5.00 pack-year smoking history. She has never used smokeless tobacco. Ms. Eid reports that she does not drink alcohol.   Review of Systems CONSTITUTIONAL: No weight loss, fever, chills, weakness or fatigue.  HEENT: Eyes: No visual loss, blurred vision, double vision or yellow sclerae.No hearing loss, sneezing, congestion, runny nose or sore throat.  SKIN: No rash or itching.  CARDIOVASCULAR: per hpi RESPIRATORY: No shortness of breath, cough or sputum.  GASTROINTESTINAL: No anorexia, nausea, vomiting or diarrhea. No abdominal pain or blood.  GENITOURINARY: No burning on urination, no polyuria NEUROLOGICAL: No headache, dizziness, syncope, paralysis, ataxia, numbness or tingling in the extremities. No change in bowel or bladder control.  MUSCULOSKELETAL: No muscle, back pain, joint pain or stiffness.  LYMPHATICS: No enlarged nodes. No history of splenectomy.  PSYCHIATRIC: No history of depression or anxiety.  ENDOCRINOLOGIC: No reports of sweating, cold or heat intolerance. No polyuria or polydipsia.  [name]   Physical Examination Vitals:   12/23/16 1259  BP: (!) 187/72  Pulse: 69   Vitals:   12/23/16 1259  Weight: 173 lb 3.2 oz (78.6 kg)  Height: '5\' 5"'$  (1.651 m)    Gen: resting comfortably, no acute distress HEENT: no scleral icterus, pupils equal round and reactive, no palptable cervical adenopathy,  CV: RRR, no  m/r/g, no jvd Resp: Clear to auscultation bilaterally GI: abdomen is soft, non-tender, non-distended, normal bowel sounds, no hepatosplenomegaly MSK: extremities are warm, no edema.  Skin: warm, no rash Neuro:  no focal deficits Psych: appropriate affect   Diagnostic Studies 11/1999 Cath HEMODYNAMIC DATA: Aortic pressure was initially extremely high at 215/92. She  was  given intravenous enalaprilat, intravenous labetalol, and intravenous  nitroglycerin, ultimately bringing her systolic blood pressure down to less  than  160. Left ventricular pressure was 180/17, with a corresponding aortic  pressure of  180/80. There was no aortic valve gradient.  LEFT VENTRICULOGRAM: Wall motion was normal. Ejection fraction was estimated  t  65%. No mitral regurgitation.  ABDOMINAL AORTOGRAM: This revealed patent renal arteries and abdominal aorta.  The  left common iliac artery had a 75% stenosis. The right external iliac artery  had a  40% stenosis.  CORONARY ARTERIOGRAPHY (Right dominant):  1. The left main was normal.  2. The left anterior descending artery had a 25% stenosis in the proximal to  mid  vessel. The distal LAD had a 50% stenosis just after the bifurcation of the  large second diagonal Tarvares Lant. Further down the distal LAD is a focal 80%  stenosis.  3. The left circumflex had a 20% stenosis in the mid vessel. It gave rise to a  small OM-1, a normal sized OM-2 which had a 25% stenosis, and a normal sized  OM-3 which had a 20% stenosis.  4. The right coronary artery had a diffuse 40% stenosis extending from the mid  o  distal vessel. There was a large posterior descending artery which had a  25%,  followed by a 40% stenosis. There were two small posterolateral branches.  IMPRESSIONS:  1. Normal left ventricular systolic function.  2. Peripheral vascular disease, as described.  3. One-vessel coronary artery disease with significant stenosis  in the distal  left  anterior descending artery. This appears to correspond with her EKG changes  and  Cardiolite scan. She has moderate, but non-flow limiting disease in the  right coronary and left circumflex.  PLAN: Percutaneous intervention of the distal LAD, see below.  PTCA PROCEDURAL NOTE: Following the completion of the diagnostic  catheterization,  we opted to proceed with percutaneous intervention. The preexisting 6-French  sheath in the right femoral artery was exchanged over a wire for a 7-French  sheath.  Integrilin and heparin were administered per protocol. We used a 7-French JL-4  guiding catheter and a BMW wire. The reference vessel was approximately 2 mm  in  diameter. We utilized a 2.0 x 20 mm CrossSail balloon, which was inflated  initially to 12 atm x 2 minutes and then 14 atm x 3 minutes. Final  angiographic  images revealed significant improvement in the lumen with 25% residual stenosis  and  a possible very small non-flow limiting dissection. There was TIMI-3 flow into  the  distal vessel.  COMPLICATIONS: None.  RESULTS: Successful percutaneous transluminal coronary angioplasty of the  distal  left anterior descending artery, reducing an 80% stenosis to 25% residual with  TIMI-3 flow.  PLAN: Integrilin will be continued for 20 hours. The patient needs aggressive  blood pressure control and risk factor reduction.  In regards to her peripheral vascular disease, she will be referred for further  evaluation and possible intervention.  03/2012 Echo LVEF 60-65%, no significant abnormalities   02/19/16 Clinic EKG (performed and reviewed in clinic): sinus bradycardia, no ischemic changes      Assessment and Plan  1. HTN - difficult to control, mainlydue to medication side effects as described above - we will retry norvasc but at lower dose of 2.12m daily, consider uptitrate pending bp's Edema occurred on 138mdaily. I  advised her we may have to tolerate mild LE edema given her limited options for othe rmedicatoins.  - submit bp log in 1 week  F/u 3 months     JoArnoldo LenisM.D.

## 2016-12-23 NOTE — Patient Instructions (Signed)
Your physician recommends that you schedule a follow-up appointment in: 3 MONTHS WITH DR. Bridgewater  Your physician has recommended you make the following change in your medication: BEGIN NORVASC 2.5 MG DAILY  Your physician has requested that you regularly monitor and record your blood pressure readings at home FOR 1 WEEK. CALL OR BRING IN READINGS. Please use the same machine at the same time of day to check your readings and record them to bring to your follow-up visit.

## 2016-12-28 ENCOUNTER — Other Ambulatory Visit: Payer: Self-pay | Admitting: Cardiology

## 2017-01-04 ENCOUNTER — Telehealth: Payer: Self-pay | Admitting: Cardiology

## 2017-01-04 MED ORDER — AMLODIPINE BESYLATE 5 MG PO TABS: 5.0000 mg | ORAL_TABLET | Freq: Every day | ORAL | 1 refills | Status: DC

## 2017-01-04 NOTE — Telephone Encounter (Signed)
159/70    68 156/71    66 159/70     65 146/72     63 169/79    66  136/73    61 159/74     71 155/72     62   norvac not causing feet to swell

## 2017-01-04 NOTE — Telephone Encounter (Signed)
LM to return call.

## 2017-01-04 NOTE — Telephone Encounter (Signed)
Bp's still too high, lets increase norvasc to 5mg  daily. Update [place] with bp's in 1 week  Zandra Abts MD

## 2017-01-04 NOTE — Telephone Encounter (Signed)
Pt aware and says she will take 2 of the 2.5 mg amlodipine until she runs out - new rx sent

## 2017-01-15 ENCOUNTER — Other Ambulatory Visit: Payer: Self-pay | Admitting: Cardiology

## 2017-01-31 ENCOUNTER — Other Ambulatory Visit: Payer: Self-pay | Admitting: Cardiology

## 2017-03-08 ENCOUNTER — Telehealth: Payer: Self-pay | Admitting: Internal Medicine

## 2017-03-08 NOTE — Telephone Encounter (Signed)
Recall for tcs °

## 2017-03-09 NOTE — Telephone Encounter (Signed)
Letter mailed to pt.  

## 2017-03-22 ENCOUNTER — Encounter: Payer: Self-pay | Admitting: Cardiology

## 2017-03-22 ENCOUNTER — Ambulatory Visit (INDEPENDENT_AMBULATORY_CARE_PROVIDER_SITE_OTHER): Payer: Medicare Other | Admitting: Cardiology

## 2017-03-22 VITALS — BP 206/73 | HR 77 | Ht 65.0 in | Wt 174.6 lb

## 2017-03-22 DIAGNOSIS — I10 Essential (primary) hypertension: Secondary | ICD-10-CM

## 2017-03-22 MED ORDER — LABETALOL HCL 100 MG PO TABS: 100.0000 mg | ORAL_TABLET | Freq: Two times a day (BID) | ORAL | 3 refills | Status: DC

## 2017-03-22 NOTE — Patient Instructions (Signed)
Medication Instructions:  Your physician has recommended you make the following change in your medication: BEGIN TAKING LABETALOL 100 MG TWICE DAILY  Labwork: NONE  Testing/Procedures: NONE  Follow-Up: Your physician wants you to follow-up in: [place] DR. BRANCH. You will receive a reminder letter in the mail two months in advance. If you don't receive a letter, please call our office to schedule the follow-up appointment.  Any Other Special Instructions Will Be Listed Below (If Applicable).  PLEASE CALL OUR OFFICE IN 1 WEEK TO UPDATE [place] WITH BLOOD PRESSURES.  If you need a refill on your cardiac medications before your next appointment, please call your pharmacy.

## 2017-03-22 NOTE — Progress Notes (Signed)
Clinical Summary Ms. Boldin is a 76 y.o.female presents for a focused visit on her history of HTN.   1. HTN - history of difficult to treat HTN, primarily due to reported medication side effects to several agents.  - history of angioedema on ACE-I and most recently on ARB.  - she reports amlodopine caused leg swelling (she had been on 4m). Clonidine caused dizziness.  - we had started chlrothalidone 587mdaily. She had some muscle cramping, and the dose was decreased to 259maily, then 80m8md and then 12.5mg 44m.  - dizziness after starting hydralazine, pins and needles feelings in ears with laying down. We have tried continuing this medication. Same side effect reported on nitrates, discontinued.  - she reports alopecia possibly due to aldactone - Borderline low HRs, have not used beta blocker.    - last visit changed chlorthalidone to 12.5mg e69my other day due to leg cramps. Still having leg cramps, though states occurred prior to chlorthalidone. Hair loss with aldactone she thinks.   - bp's at home 140-160/60-70 since last visit  Past Medical History:  Diagnosis Date  . 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 Diabetes mellitus   . Hypercholesterolemia   . Hypertension   . Peripheral vascular disease (HCC)  [place] post iliac stent July 2001      Allergies  Allergen Reactions  . Clonidine Derivatives Other (See Comments)    dizziness  . Lisinopril Other (See Comments)    Tongue swelling  . Losartan     TONGUE SWELLING  . Codeine Nausea And Vomiting  . Hydrocodone Nausea And Vomiting     Current Outpatient Prescriptions  Medication Sig Dispense Refill  . amLODipine (NORVASC) 5 MG tablet Take 1 tablet (5 mg total) by mouth daily. 90 tablet 1  . aspirin 81 MG tablet Take 81 mg by mouth at bedtime.     . betamethasone dipropionate (DIPROLENE) 0.05 %  cream Apply 1 application topically 2 (two) times daily.    . bimatoprost (LUMIGAN) 0.01 % SOLN Place 1 drop into both eyes at bedtime.    . Biotin (BIOTIN 5000) 5 MG CAPS Take 1 capsule by mouth daily.    . busPIRone (BUSPAR) 5 MG tablet Take 0.5 tablets (2.5 mg total) by mouth at bedtime. 45 tablet 3  . Calcium Carbonate-Vit D-Min (CALCIUM 1200 PO) Take 1 capsule by mouth daily.    . chlorthalidone (HYGROTON) 25 MG tablet TAKE 1/2 TABLET (12.5MG) EVERY OTHER DAY. 9 tablet 0  . ENDOCET 10-325 MG per tablet Take 1 tablet by mouth 2 (two) times daily.     . hydrALAZINE (APRESOLINE) 25 MG tablet TAKE 1 & 1/2 TABLETS 3 TIMES DAILY. 135 tablet 0  . insulin glargine (LANTUS) 100 UNIT/ML injection Take 12 units every morning & 42 units every evening    . insulin lispro (HUMALOG) 100 UNIT/ML injection Inject 10-16 Units into the skin 3 (three) times daily with meals.    . isosorbide mononitrate (IMDUR) 30 MG 24 hr tablet Take 1 tablet (30 mg total) by mouth daily. (Patient not taking: Reported on 12/23/2016) 90 tablet 3  . metFORMIN (GLUMETZA) 1000 MG (MOD) 24 hr tablet Take 1,000 mg by mouth 2 (two) times daily.    . omepMarland Kitchenazole (PRILOSEC) 20 MG capsule Take 20 mg by mouth 2 (two) times daily before a meal.     .  ONE TOUCH ULTRA TEST test strip     . polyethylene glycol (MIRALAX / GLYCOLAX) packet Take 17 g by mouth daily as needed for moderate constipation.     . rosuvastatin (CRESTOR) 10 MG tablet Take 10 mg by mouth at bedtime.     . SYMBICORT 160-4.5 MCG/ACT inhaler Inhale 2 puffs into the lungs 2 (two) times daily.      No current facility-administered medications for this visit.      Past Surgical History:  Procedure Laterality Date  . CERVICAL FUSION    . CHOLECYSTECTOMY    . CORONARY STENT PLACEMENT    . stent in r leg       Allergies  Allergen Reactions  . Clonidine Derivatives Other (See Comments)    dizziness  . Lisinopril Other (See Comments)    Tongue swelling  . Losartan       TONGUE SWELLING  . Codeine Nausea And Vomiting  . Hydrocodone Nausea And Vomiting      Family History  Problem Relation Age of Onset  . Heart failure Father   . Heart failure Mother   . Diabetes Mother   . COPD Brother      Social History Ms. Fleer reports that she quit smoking about 21 years ago. Her smoking use included Cigarettes. She started smoking about 55 years ago. She has a 5.00 pack-year smoking history. She has never used smokeless tobacco. Ms. Wrubel reports that she does not drink alcohol.   Review of Systems CONSTITUTIONAL: No weight loss, fever, chills, weakness or fatigue.  HEENT: Eyes: No visual loss, blurred vision, double vision or yellow sclerae.No hearing loss, sneezing, congestion, runny nose or sore throat.  SKIN: No rash or itching.  CARDIOVASCULAR: per hpi RESPIRATORY: No shortness of breath, cough or sputum.  GASTROINTESTINAL: No anorexia, nausea, vomiting or diarrhea. No abdominal pain or blood.  GENITOURINARY: No burning on urination, no polyuria NEUROLOGICAL: No headache, dizziness, syncope, paralysis, ataxia, numbness or tingling in the extremities. No change in bowel or bladder control.  MUSCULOSKELETAL: No muscle, back pain, joint pain or stiffness.  LYMPHATICS: No enlarged nodes. No history of splenectomy.  PSYCHIATRIC: No history of depression or anxiety.  ENDOCRINOLOGIC: No reports of sweating, cold or heat intolerance. No polyuria or polydipsia.  [name]   Physical Examination Vitals:   03/22/17 0949 03/22/17 0953  BP: (!) 182/78 (!) 206/73  Pulse: 70 77   Vitals:   03/22/17 0949  Weight: 174 lb 9.6 oz (79.2 kg)  Height: '5\' 5"'  (1.651 m)    Gen: resting comfortably, no acute distress HEENT: no scleral icterus, pupils equal round and reactive, no palptable cervical adenopathy,  CV: RRR, no m/r/g, no jvd Resp: Clear to auscultation bilaterally GI: abdomen is soft, non-tender, non-distended, normal bowel sounds, no  hepatosplenomegaly MSK: extremities are warm, no edema.  Skin: warm, no rash Neuro:  no focal deficits Psych: appropriate affect   Diagnostic Studies 11/1999 Cath HEMODYNAMIC DATA: Aortic pressure was initially extremely high at 215/92. She  was  given intravenous enalaprilat, intravenous labetalol, and intravenous  nitroglycerin, ultimately bringing her systolic blood pressure down to less  than  160. Left ventricular pressure was 180/17, with a corresponding aortic  pressure of  180/80. There was no aortic valve gradient.  LEFT VENTRICULOGRAM: Wall motion was normal. Ejection fraction was estimated  t  65%. No mitral regurgitation.  ABDOMINAL AORTOGRAM: This revealed patent renal arteries and abdominal aorta.  The  left common iliac artery had a 75% stenosis.  The right external iliac artery  had a  40% stenosis.  CORONARY ARTERIOGRAPHY (Right dominant):  1. The left main was normal.  2. The left anterior descending artery had a 25% stenosis in the proximal to  mid  vessel. The distal LAD had a 50% stenosis just after the bifurcation of the  large second diagonal Cameron Schwinn. Further down the distal LAD is a focal 80%  stenosis.  3. The left circumflex had a 20% stenosis in the mid vessel. It gave rise to a  small OM-1, a normal sized OM-2 which had a 25% stenosis, and a normal sized  OM-3 which had a 20% stenosis.  4. The right coronary artery had a diffuse 40% stenosis extending from the mid  o  distal vessel. There was a large posterior descending artery which had a  25%,  followed by a 40% stenosis. There were two small posterolateral branches.  IMPRESSIONS:  1. Normal left ventricular systolic function.  2. Peripheral vascular disease, as described.  3. One-vessel coronary artery disease with significant stenosis in the distal  left  anterior descending artery. This appears to correspond with her EKG changes  and  Cardiolite scan.  She has moderate, but non-flow limiting disease in the  right coronary and left circumflex.  PLAN: Percutaneous intervention of the distal LAD, see below.  PTCA PROCEDURAL NOTE: Following the completion of the diagnostic  catheterization,  we opted to proceed with percutaneous intervention. The preexisting 6-French  sheath in the right femoral artery was exchanged over a wire for a 7-French  sheath.  Integrilin and heparin were administered per protocol. We used a 7-French JL-4  guiding catheter and a BMW wire. The reference vessel was approximately 2 mm  in  diameter. We utilized a 2.0 x 20 mm CrossSail balloon, which was inflated  initially to 12 atm x 2 minutes and then 14 atm x 3 minutes. Final  angiographic  images revealed significant improvement in the lumen with 25% residual stenosis  and  a possible very small non-flow limiting dissection. There was TIMI-3 flow into  the  distal vessel.  COMPLICATIONS: None.  RESULTS: Successful percutaneous transluminal coronary angioplasty of the  distal  left anterior descending artery, reducing an 80% stenosis to 25% residual with  TIMI-3 flow.  PLAN: Integrilin will be continued for 20 hours. The patient needs aggressive  blood pressure control and risk factor reduction.  In regards to her peripheral vascular disease, she will be referred for further  evaluation and possible intervention.  03/2012 Echo LVEF 60-65%, no significant abnormalities   02/19/16 Clinic EKG (performed and reviewed in clinic): sinus bradycardia, no ischemic changes      Assessment and Plan  1. HTN - difficult to control, mainlydue to medication side effects as described above - manual bp in clinic today 160/70 - we will try labetalol 160m bid, submit bp log in 1 week.    F/u 4 months    JArnoldo Lenis M.D.

## 2017-03-30 ENCOUNTER — Telehealth: Payer: Self-pay | Admitting: *Deleted

## 2017-03-30 NOTE — Telephone Encounter (Signed)
BP readings per 6/19 OV with Dr Harl Bowie - says she has gained 4 lbs since LOV - has some swelling in feet mostly in the left foot/leg - does say she is feeling better since LOV   Date - 6/19 - BP - 160/65 HR 66 6/20 - 144/66   71 6/20 - 152/70   60 6/21 - 140/70   68 6/22 - 129/56   64 6/23 - 151/65   53 6/24 - 139/62   61 6/25 - 133/62   61 6/26 - 134/60   65

## 2017-03-30 NOTE — Telephone Encounter (Signed)
Pt voiced understanding and will contact us if symptoms worsen

## 2017-03-30 NOTE — Telephone Encounter (Signed)
Im ok with her bp numbers at this time, occasional high numbers but for her they are quite good overall. Don't want to be too aggressive with med changes due to her sensitivity to meds in the past. Continue current meds, have her continue to monitor her edema and weight, if continues to progress contact us.  Zandra Abts MD

## 2017-04-15 ENCOUNTER — Telehealth: Payer: Self-pay | Admitting: Cardiology

## 2017-04-15 NOTE — Telephone Encounter (Signed)
[name] [name] called stating that she is having a lot of swelling in her feet and ankles.   States that this has been going on for 2 weeks now.   Please call [phone].

## 2017-04-15 NOTE — Telephone Encounter (Signed)
Per patient, since taking amlodipine c/o having a lot of swelling in legs and feet especially the left extremity. Patient said she did not take amlodipine today and the swelling isn't as bad. No chest pain, dizziness or sob. Patient said she was told to call office if swelling occurred.

## 2017-04-18 NOTE — Telephone Encounter (Signed)
Pt calling to f/u on phone message from [place] - says left leg is still swelling - says she has gained about 7lbs in the last week. Denies CP/dizzniess/SOB - says she has still been taking amlodipine 5 mg daily

## 2017-04-18 NOTE — Telephone Encounter (Signed)
Pt will come tomorrow @ 940am.

## 2017-04-18 NOTE — Telephone Encounter (Signed)
Can she come seem in at 940 tomorrow? I'd like to evalute her edema and weight gain and decide on next best course of action   Zandra Abts MD

## 2017-04-19 ENCOUNTER — Encounter: Payer: Self-pay | Admitting: Cardiology

## 2017-04-19 ENCOUNTER — Ambulatory Visit (INDEPENDENT_AMBULATORY_CARE_PROVIDER_SITE_OTHER): Payer: Medicare Other | Admitting: Cardiology

## 2017-04-19 VITALS — BP 188/82 | HR 62 | Ht 65.0 in | Wt 173.0 lb

## 2017-04-19 DIAGNOSIS — I10 Essential (primary) hypertension: Secondary | ICD-10-CM

## 2017-04-19 NOTE — Progress Notes (Signed)
Clinical Summary [name] [name] is a 76 y.o.female seen today for a focused visit on her history of HTN.    1. HTN - history of difficult to treat HTN, primarily due to reported medication side effects to several agents.  - history of angioedema on ACE-I and most recently on ARB.  - she reports amlodopine caused leg swelling(she had been on 58m). Clonidine caused dizziness.  - we had started chlrothalidone 541mdaily. She had some muscle cramping, and the dose was decreased to 2542maily, then 19m56md and then 12.5mg 36m.  - dizziness after starting hydralazine, pins and needles feelings in ears with laying down. We have tried continuing this medication. Same side effect reported on nitrates, discontinued.  - she reports alopecia possibly due to aldactone  - Borderline low HRs, initially hesistant to try beta blockers but last visit started labetalol - she reports 7lbs weight gain and leg swelling.    - swelling started about 2 weeks after starting norvasc. L>R LE edema. Mild SOB outside in heat. No change in weight.  - home bp's 140s-150s/60-70s, HR 55-65      Past Medical History:  Diagnosis Date  . 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 Diabetes mellitus   . Hypercholesterolemia   . Hypertension   . Peripheral vascular disease (HCC) [place] status post iliac stent July 2001      Allergies  Allergen Reactions  . Clonidine Derivatives Other (See Comments)    dizziness  . Lisinopril Other (See Comments)    Tongue swelling  . Losartan     TONGUE SWELLING  . Codeine Nausea And Vomiting  . Hydrocodone Nausea And Vomiting     Current Outpatient Prescriptions  Medication Sig Dispense Refill  . amLODipine (NORVASC) 5 MG tablet Take 1 tablet (5 mg total) by mouth daily. 90 tablet 1  . aspirin 81 MG tablet Take 81 mg by mouth at bedtime.     . betamethasone  dipropionate (DIPROLENE) 0.05 % cream Apply 1 application topically 2 (two) times daily.    . bimatoprost (LUMIGAN) 0.01 % SOLN Place 1 drop into both eyes at bedtime.    . Biotin (BIOTIN 5000) 5 MG CAPS Take 1 capsule by mouth daily.    . busPIRone (BUSPAR) 5 MG tablet Take 0.5 tablets (2.5 mg total) by mouth at bedtime. 45 tablet 3  . Calcium Carbonate-Vit D-Min (CALCIUM 1200 PO) Take 1 capsule by mouth daily.    . chlorthalidone (HYGROTON) 25 MG tablet TAKE 1/2 TABLET (12.5MG) EVERY OTHER DAY. 9 tablet 0  . ENDOCET 10-325 MG per tablet Take 1 tablet by mouth 2 (two) times daily.     . hydrALAZINE (APRESOLINE) 25 MG tablet TAKE 1 & 1/2 TABLETS 3 TIMES DAILY. 135 tablet 0  . insulin glargine (LANTUS) 100 UNIT/ML injection Take 12 units every morning & 42 units every evening    . insulin lispro (HUMALOG) 100 UNIT/ML injection Inject 10-16 Units into the skin 3 (three) times daily with meals.    . isosorbide mononitrate (IMDUR) 30 MG 24 hr tablet Take 1 tablet (30 mg total) by mouth daily. 90 tablet 3  . labetalol (NORMODYNE) 100 MG tablet Take 1 tablet (100 mg total) by mouth 2 (two) times daily. 60 tablet 3  . metFORMIN (GLUMETZA) 1000 MG (MOD) 24 hr tablet Take 1,000 mg by mouth 2 (  two) times daily.    [name] omeprazole (PRILOSEC) 20 MG capsule Take 20 mg by mouth 2 (two) times daily before a meal.     . ONE TOUCH ULTRA TEST test strip     . polyethylene glycol (MIRALAX / GLYCOLAX) packet Take 17 g by mouth daily as needed for moderate constipation.     . rosuvastatin (CRESTOR) 10 MG tablet Take 10 mg by mouth at bedtime.     . SYMBICORT 160-4.5 MCG/ACT inhaler Inhale 2 puffs into the lungs 2 (two) times daily.      No current facility-administered medications for this visit.      Past Surgical History:  Procedure Laterality Date  . CERVICAL FUSION    . CHOLECYSTECTOMY    . CORONARY STENT PLACEMENT    . stent in r leg       Allergies  Allergen Reactions  . Clonidine Derivatives Other  (See Comments)    dizziness  . Lisinopril Other (See Comments)    Tongue swelling  . Losartan     TONGUE SWELLING  . Codeine Nausea And Vomiting  . Hydrocodone Nausea And Vomiting      Family History  Problem Relation Age of Onset  . Heart failure Father   . Heart failure Mother   . Diabetes Mother   . COPD Brother      Social History [name] [name] reports that she quit smoking about 21 years ago. Her smoking use included Cigarettes. She started smoking about 55 years ago. She has a 5.00 pack-year smoking history. She has never used smokeless tobacco. [name] [name] reports that she does not drink alcohol.   Review of Systems CONSTITUTIONAL: No weight loss, fever, chills, weakness or fatigue.  HEENT: Eyes: No visual loss, blurred vision, double vision or yellow sclerae.No hearing loss, sneezing, congestion, runny nose or sore throat.  SKIN: No rash or itching.  CARDIOVASCULAR: per hpi RESPIRATORY: No shortness of breath, cough or sputum.  GASTROINTESTINAL: No anorexia, nausea, vomiting or diarrhea. No abdominal pain or blood.  GENITOURINARY: No burning on urination, no polyuria NEUROLOGICAL: No headache, dizziness, syncope, paralysis, ataxia, numbness or tingling in the extremities. No change in bowel or bladder control.  MUSCULOSKELETAL: No muscle, back pain, joint pain or stiffness.  LYMPHATICS: No enlarged nodes. No history of splenectomy.  PSYCHIATRIC: No history of depression or anxiety.  ENDOCRINOLOGIC: No reports of sweating, cold or heat intolerance. No polyuria or polydipsia.  [name]   Physical Examination Vitals:   04/19/17 0948 04/19/17 0956  BP: (!) 192/80 (!) 188/82  Pulse: 62    Vitals:   04/19/17 0948  Weight: 173 lb (78.5 kg)  Height: 5' 5" (1.651 m)    Gen: resting comfortably, no acute distress HEENT: no scleral icterus, pupils equal round and reactive, no palptable cervical adenopathy,  CV: RRR, no m/r/g no jvd Resp: Clear to auscultation bilaterally GI:  abdomen is soft, non-tender, non-distended, normal bowel sounds, no hepatosplenomegaly MSK: extremities are warm, trace bilateral edema  Skin: warm, no rash Neuro:  no focal deficits Psych: appropriate affect   Diagnostic Studies 11/1999 Cath HEMODYNAMIC DATA: Aortic pressure was initially extremely high at 215/92. She  was  given intravenous enalaprilat, intravenous labetalol, and intravenous  nitroglycerin, ultimately bringing her systolic blood pressure down to less  than  160. Left ventricular pressure was 180/17, with a corresponding aortic  pressure of  180/80. There was no aortic valve gradient.  LEFT VENTRICULOGRAM: Wall motion was normal. Ejection fraction was estimated  t  65%. No mitral regurgitation.  ABDOMINAL AORTOGRAM: This revealed patent renal arteries and abdominal aorta.  The  left common iliac artery had a 75% stenosis. The right external iliac artery  had a  40% stenosis.  CORONARY ARTERIOGRAPHY (Right dominant):  1. The left main was normal.  2. The left anterior descending artery had a 25% stenosis in the proximal to  mid  vessel. The distal LAD had a 50% stenosis just after the bifurcation of the  large second diagonal branch. Further down the distal LAD is a focal 80%  stenosis.  3. The left circumflex had a 20% stenosis in the mid vessel. It gave rise to a  small OM-1, a normal sized OM-2 which had a 25% stenosis, and a normal sized  OM-3 which had a 20% stenosis.  4. The right coronary artery had a diffuse 40% stenosis extending from the mid  o  distal vessel. There was a large posterior descending artery which had a  25%,  followed by a 40% stenosis. There were two small posterolateral branches.  IMPRESSIONS:  1. Normal left ventricular systolic function.  2. Peripheral vascular disease, as described.  3. One-vessel coronary artery disease with significant stenosis in the distal  left  anterior descending  artery. This appears to correspond with her EKG changes  and  Cardiolite scan. She has moderate, but non-flow limiting disease in the  right coronary and left circumflex.  PLAN: Percutaneous intervention of the distal LAD, see below.  PTCA PROCEDURAL NOTE: Following the completion of the diagnostic  catheterization,  we opted to proceed with percutaneous intervention. The preexisting 6-French  sheath in the right femoral artery was exchanged over a wire for a 7-French  sheath.  Integrilin and heparin were administered per protocol. We used a 7-French JL-4  guiding catheter and a BMW wire. The reference vessel was approximately 2 mm  in  diameter. We utilized a 2.0 x 20 mm CrossSail balloon, which was inflated  initially to 12 atm x 2 minutes and then 14 atm x 3 minutes. Final  angiographic  images revealed significant improvement in the lumen with 25% residual stenosis  and  a possible very small non-flow limiting dissection. There was TIMI-3 flow into  the  distal vessel.  COMPLICATIONS: None.  RESULTS: Successful percutaneous transluminal coronary angioplasty of the  distal  left anterior descending artery, reducing an 80% stenosis to 25% residual with  TIMI-3 flow.  PLAN: Integrilin will be continued for 20 hours. The patient needs aggressive  blood pressure control and risk factor reduction.  In regards to her peripheral vascular disease, she will be referred for further  evaluation and possible intervention.  03/2012 Echo LVEF 60-65%, no significant abnormalities   02/19/16 Clinic EKG (performed and reviewed in clinic): sinus bradycardia, no ischemic changes     Assessment and Plan  1. HTN - difficult to control, mainlydue to medication side effects as described above -mild edema since starting norvasc, will continue at this time as there are very limited other options - bp elevated in clinic, home numbers more reasonable especially  given medication limitations - continue current meds   F/u 6 months      [name], M.D.

## 2017-04-19 NOTE — Patient Instructions (Signed)

## 2017-04-29 ENCOUNTER — Other Ambulatory Visit: Payer: Self-pay | Admitting: Cardiology

## 2017-05-09 ENCOUNTER — Telehealth: Payer: Self-pay

## 2017-05-09 NOTE — Telephone Encounter (Signed)
Patient notified and verbalized understanding. 

## 2017-05-09 NOTE — Telephone Encounter (Signed)
BP's are normal, at those values she should not have any symptoms. Plus low blood pressure wouldn't make her sweaty or SOB. Heart rates are mildly low, but once again not severe and would not cause those types of symptoms. If she wishes to hold the norvasc Im ok for the next 2-3 days. Have her contact us back with update on symptoms. I would suggest she see her primary if her symptoms continue, if they feel its cardiac then we can evaluate her at that time time  [name] Abts MD

## 2017-05-09 NOTE — Telephone Encounter (Signed)
118/61 hr 57 114/68 HR 58 109/61 HR 50 118/60 HR 50 120/60 HR 58   Patient states she feels her BP is running too low. Patient states she is sweating and very short of breath. Patient feels this is coming from the amlodipine. BS was 138. Advised patient those BP were not too low those were in normal range. Patient states she has not taken Amlodipine this morning and BP was 144/80. Patient states she is afraid to take the amlodipine again. Advised patient I would send this over to Dr. Harl Bowie for further advice.

## 2017-06-07 ENCOUNTER — Other Ambulatory Visit: Payer: Self-pay | Admitting: *Deleted

## 2017-06-07 MED ORDER — HYDRALAZINE HCL 25 MG PO TABS: ORAL_TABLET | ORAL | 6 refills | Status: DC

## 2017-06-10 ENCOUNTER — Telehealth: Payer: Self-pay | Admitting: Cardiology

## 2017-06-10 NOTE — Telephone Encounter (Signed)
Up since 2 am - noticing that when she takes the Chlorthalidone 25mg  1/2 tab every other day.  Approx 1 hour after taking - has extreme cramping in toes, fingers, & side of left leg (from ankle up to calf).  Was doing pretty good till about 5 days ago.  BP this morning was 89/51  HR 54 at 2 am  Woke up in the middle of night feeling like her sugar was dropping - drank some OJ - sugar was 122.  Stated that she mixed some salt in water & drank it & seemed to help bring BP back up & also helped her fingers and toes.    Recheck BP at 9:00 was 114/58  62.  Patient is not on any potassium replacement at this time.  Message sent to provider for further advice.

## 2017-06-10 NOTE — Telephone Encounter (Signed)
Patient called to complain about severe cramping.  Stated that her fingers are curled up and her toes.  She said her BP hasn't been normal either was low until she drank some salt water and that brought it up some.

## 2017-06-13 NOTE — Telephone Encounter (Signed)
Pt aware and voiced understanding - says she has been drinking more water over the weekend and it is helping the cramps but still having them. Pt will hold chlorthalidone and call us back at the end of the week.

## 2017-06-13 NOTE — Telephone Encounter (Signed)
Likely she is dehydrated, hold the chlorthalide x 3 days and then update [place]. She should be drinking 2-3 liters of water daily   J Lindwood Mogel MD

## 2017-06-13 NOTE — Telephone Encounter (Signed)
LM to return call.

## 2017-06-16 ENCOUNTER — Telehealth: Payer: Self-pay | Admitting: Cardiology

## 2017-06-16 NOTE — Telephone Encounter (Signed)
Patient states she was only taking 1/2 tablet (12.5 mg) every other day

## 2017-06-16 NOTE — Telephone Encounter (Signed)
[name] [name] returned call today to report her conditions .please call [phone].

## 2017-06-16 NOTE — Telephone Encounter (Signed)
Have her change chlorthalidone to just 25mg  every other day instead of 37.5mg    Zandra Abts MD

## 2017-06-16 NOTE — Telephone Encounter (Signed)
Patient reports she is feeling better. Patient states her BP is 144/63 HR 61 Patient wants to know if she is supposed to start Chlorthalidone back or to stay off of it?

## 2017-06-17 MED ORDER — CHLORTHALIDONE 25 MG PO TABS: 12.5000 mg | ORAL_TABLET | ORAL | 3 refills | Status: DC

## 2017-06-17 NOTE — Telephone Encounter (Signed)
I misunderstood, I would have her take chlorthalidone 12.5mg  on Mondays and Friday please   Zandra Abts MD

## 2017-06-17 NOTE — Telephone Encounter (Signed)
Left message to return call 

## 2017-06-17 NOTE — Telephone Encounter (Signed)
Patient informed and verbalized understanding of plan. 

## 2017-07-05 ENCOUNTER — Other Ambulatory Visit: Payer: Self-pay | Admitting: Cardiology

## 2017-07-11 ENCOUNTER — Other Ambulatory Visit: Payer: Self-pay | Admitting: Cardiology

## 2017-07-21 ENCOUNTER — Other Ambulatory Visit: Payer: Self-pay | Admitting: Cardiology

## 2017-08-12 ENCOUNTER — Other Ambulatory Visit: Payer: Self-pay | Admitting: Cardiology

## 2017-09-12 ENCOUNTER — Other Ambulatory Visit: Payer: Self-pay | Admitting: Cardiology

## 2018-01-03 ENCOUNTER — Ambulatory Visit (INDEPENDENT_AMBULATORY_CARE_PROVIDER_SITE_OTHER): Payer: Medicare Other | Admitting: Cardiology

## 2018-01-03 ENCOUNTER — Encounter: Payer: Self-pay | Admitting: Cardiology

## 2018-01-03 VITALS — BP 174/78 | HR 65 | Ht 65.5 in | Wt 172.0 lb

## 2018-01-03 DIAGNOSIS — I739 Peripheral vascular disease, unspecified: Secondary | ICD-10-CM

## 2018-01-03 DIAGNOSIS — I25119 Atherosclerotic heart disease of native coronary artery with unspecified angina pectoris: Secondary | ICD-10-CM | POA: Diagnosis not present

## 2018-01-03 DIAGNOSIS — E782 Mixed hyperlipidemia: Secondary | ICD-10-CM

## 2018-01-03 DIAGNOSIS — I10 Essential (primary) hypertension: Secondary | ICD-10-CM | POA: Diagnosis not present

## 2018-01-03 NOTE — Patient Instructions (Addendum)
Your physician wants you to follow-up in: [name] [name] will receive a reminder letter in the mail two months in advance. If you don't receive a letter, please call our office to schedule the follow-up appointment.  Your physician recommends that you continue on your current medications as directed. Please refer to the Current Medication list given to you today.  Please arrive at the [facility] main entrance of [facility] at      (30-45 minutes prior to test start time)  [facility] [place],  [id number] [phone]  Proceed to the [facility] Radiology Department (First Floor).  Please follow these instructions carefully (unless otherwise directed):  On the Night Before the Test: . Drink plenty of water. . Do not consume any caffeinated/decaffeinated beverages or chocolate 12 hours prior to your test. . Do not take any antihistamines 12 hours prior to your test. . If you take Metformin do not take 24 hours prior to test.  On the Day of the Test: . Drink plenty of water. Do not drink any water within one hour of the test. . Do not eat any food 4 hours prior to the test. . You may take your regular medications prior to the test OTHER THAN DIABETIC MEDICATIONS . HOLD Furosemide morning of the test.  After the Test: . Drink plenty of water. . After receiving IV contrast, you may experience a mild flushed feeling. This is normal. . On occasion, you may experience a mild rash up to 24 hours after the test. This is not dangerous. If this occurs, you can take Benadryl 25 mg and increase your fluid intake. . If you experience trouble breathing, this can be serious. If it is severe call 911 IMMEDIATELY. If it is mild, please call our office. . If you take any of these medications: Glipizide/Metformin, Avandament, Glucavance, please do not take 48 hours after completing test.

## 2018-01-03 NOTE — Progress Notes (Signed)
Clinical Summary [name] [name] is a 77 y.o.female  seen today for follow up of the following medical problems.    1. HTN - history of difficult to treat HTN, primarily due to reported medication side effects to several agents.  - history of angioedema on ACE-I and most recently on ARB.  - she reports amlodopine caused leg swelling(she had been on 38m). Clonidine caused dizziness.  - we had started chlrothalidone 519mdaily. She had some muscle cramping, and the dose was decreased to 255maily, then 88m56md and then 12.5mg 37m.  - dizziness after starting hydralazine, pins and needles feelings in ears with laying down. We have tried continuing this medication. Same side effect reported on nitrates, discontinued.  - she reports alopecia possibly due to aldactone  - Borderline low HRs, initially hesistant to try beta blockers but last visit started labetalol - swelling started about 2 weeks after starting norvasc. L>R LE edema. Mild SOB outside in heat. No change in weight.   - since last visit home bp's have been 140s-150s/60-70s   2. CAD - prior PTCA to distal LAD in 2001. 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  - echo 06/2012 LVEF 60-65% - DSE 06/2012 with reported ST elevation in inferior leads, negative echo images for ischemia.    - has been working out at the [employer], 2nd time around gets chest pain and has to stop - pressure like entire chest, 4/10 in severity. +SOB. She stops and rests, and then pain resolves in just a few seconds. - unable to run on treadmill. Reports signidicant symptoms with DSE in the past. In general wished to avoid pharmacological stress if possible due to side effects  3. PAD - prior iliac stent in July 2001 - [place] 9/[place] Right ABI 0.99 Left ABI 0.99, with patent left iliac stent.  - no recent symptoms  4. Hyperlipidemia - compliant with meds   Past Medical  History:  Diagnosis Date  . 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 Diabetes mellitus   . Hypercholesterolemia   . Hypertension   . Peripheral vascular disease (HCC) [place] status post iliac stent July 2001      Allergies  Allergen Reactions  . Clonidine Derivatives Other (See Comments)    dizziness  . Lisinopril Other (See Comments)    Tongue swelling  . Losartan     TONGUE SWELLING  . Codeine Nausea And Vomiting  . Hydrocodone Nausea And Vomiting     Current Outpatient Medications  Medication Sig Dispense Refill  . amLODipine (NORVASC) 2.5 MG tablet TAKE 1 TABLET ONCE DAILY. 30 tablet 6  . amLODipine (NORVASC) 5 MG tablet TAKE (1) TABLET BY MOUTH ONCE DAILY. 30 tablet 3  . aspirin 81 MG tablet Take 81 mg by mouth at bedtime.     . betamethasone dipropionate (DIPROLENE) 0.05 % cream Apply 1 application topically 2 (two) times daily.    . bimatoprost (LUMIGAN) 0.01 % SOLN Place 1 drop into both eyes at bedtime.    . Biotin (BIOTIN 5000) 5 MG CAPS Take 1 capsule by mouth daily.    . busPIRone (BUSPAR) 5 MG tablet TAKE 1/2 TABLET BY MOUTH AT BEDTIME. 15 tablet 6  . Calcium Carbonate-Vit D-Min (CALCIUM 1200 PO) Take 1 capsule by mouth daily.    .[name]  chlorthalidone (HYGROTON) 25 MG tablet Take 0.5 tablets (12.5 mg total) by mouth 2 (two) times a week. On Monday and Fridays 9 tablet 3  . ENDOCET 10-325 MG per tablet Take 1 tablet by mouth 2 (two) times daily.     . hydrALAZINE (APRESOLINE) 25 MG tablet TAKE 1 & 1/2 TABLETS 3 TIMES DAILY. 135 tablet 6  . insulin glargine (LANTUS) 100 UNIT/ML injection Take 12 units every morning & 42 units every evening    . insulin lispro (HUMALOG) 100 UNIT/ML injection Inject 10-16 Units into the skin 3 (three) times daily with meals.    . isosorbide mononitrate (IMDUR) 30 MG 24 hr tablet Take 1 tablet (30 mg total) by mouth daily. (Patient not taking:  Reported on 04/19/2017) 90 tablet 3  . labetalol (NORMODYNE) 100 MG tablet TAKE (1) TABLET TWICE DAILY. 60 tablet 0  . labetalol (NORMODYNE) 100 MG tablet TAKE (1) TABLET TWICE DAILY. 60 tablet 0  . labetalol (NORMODYNE) 100 MG tablet TAKE (1) TABLET TWICE DAILY. 60 tablet 6  . metFORMIN (GLUMETZA) 1000 MG (MOD) 24 hr tablet Take 1,000 mg by mouth 2 (two) times daily.    [name] omeprazole (PRILOSEC) 20 MG capsule Take 20 mg by mouth 2 (two) times daily before a meal.     . ONE TOUCH ULTRA TEST test strip     . polyethylene glycol (MIRALAX / GLYCOLAX) packet Take 17 g by mouth daily as needed for moderate constipation.     . rosuvastatin (CRESTOR) 10 MG tablet Take 10 mg by mouth at bedtime.     . SYMBICORT 160-4.5 MCG/ACT inhaler Inhale 2 puffs into the lungs 2 (two) times daily.      No current facility-administered medications for this visit.      Past Surgical History:  Procedure Laterality Date  . CERVICAL FUSION    . CHOLECYSTECTOMY    . CORONARY STENT PLACEMENT    . stent in r leg       Allergies  Allergen Reactions  . Clonidine Derivatives Other (See Comments)    dizziness  . Lisinopril Other (See Comments)    Tongue swelling  . Losartan     TONGUE SWELLING  . Codeine Nausea And Vomiting  . Hydrocodone Nausea And Vomiting      Family History  Problem Relation Age of Onset  . Heart failure Father   . Heart failure Mother   . Diabetes Mother   . COPD Brother      Social History [name] [name] reports that she quit smoking about 22 years ago. Her smoking use included cigarettes. She started smoking about 56 years ago. She has a 5.00 pack-year smoking history. She has never used smokeless tobacco. [name] [name] reports that she does not drink alcohol.   Review of Systems CONSTITUTIONAL: No weight loss, fever, chills, weakness or fatigue.  HEENT: Eyes: No visual loss, blurred vision, double vision or yellow sclerae.No hearing loss, sneezing, congestion, runny nose or sore  throat.  SKIN: No rash or itching.  CARDIOVASCULAR: per hpi RESPIRATORY: No shortness of breath, cough or sputum.  GASTROINTESTINAL: No anorexia, nausea, vomiting or diarrhea. No abdominal pain or blood.  GENITOURINARY: No burning on urination, no polyuria NEUROLOGICAL: No headache, dizziness, syncope, paralysis, ataxia, numbness or tingling in the extremities. No change in bowel or bladder control.  MUSCULOSKELETAL: No muscle, back pain, joint pain or stiffness.  LYMPHATICS: No enlarged nodes. No history of splenectomy.  PSYCHIATRIC: No history of depression or anxiety.  ENDOCRINOLOGIC: No  reports of sweating, cold or heat intolerance. No polyuria or polydipsia.  [name]   Physical Examination Vitals:   01/03/18 1454  BP: (!) 174/78  Pulse: 65  SpO2: 97%   Vitals:   01/03/18 1454  Weight: 172 lb (78 kg)  Height: 5' 5.5" (1.664 m)    Gen: resting comfortably, no acute distress HEENT: no scleral icterus, pupils equal round and reactive, no palptable cervical adenopathy,  CV: RRR, no m/r/g, no jvd Resp: Clear to auscultation bilaterally GI: abdomen is soft, non-tender, non-distended, normal bowel sounds, no hepatosplenomegaly MSK: extremities are warm, no edema.  Skin: warm, no rash Neuro:  no focal deficits Psych: appropriate affect   Diagnostic Studies 11/1999 Cath HEMODYNAMIC DATA: Aortic pressure was initially extremely high at 215/92. She  was  given intravenous enalaprilat, intravenous labetalol, and intravenous  nitroglycerin, ultimately bringing her systolic blood pressure down to less  than  160. Left ventricular pressure was 180/17, with a corresponding aortic  pressure of  180/80. There was no aortic valve gradient.  LEFT VENTRICULOGRAM: Wall motion was normal. Ejection fraction was estimated  t  65%. No mitral regurgitation.  ABDOMINAL AORTOGRAM: This revealed patent renal arteries and abdominal aorta.  The  left common iliac artery had a 75%  stenosis. The right external iliac artery  had a  40% stenosis.  CORONARY ARTERIOGRAPHY (Right dominant):  1. The left main was normal.  2. The left anterior descending artery had a 25% stenosis in the proximal to  mid  vessel. The distal LAD had a 50% stenosis just after the bifurcation of the  large second diagonal [name] [name]. Further down the distal LAD is a focal 80%  stenosis.  3. The left circumflex had a 20% stenosis in the mid vessel. It gave rise to a  small OM-1, a normal sized OM-2 which had a 25% stenosis, and a normal sized  OM-3 which had a 20% stenosis.  4. The right coronary artery had a diffuse 40% stenosis extending from the mid  o  distal vessel. There was a large posterior descending artery which had a  25%,  followed by a 40% stenosis. There were two small posterolateral branches.  IMPRESSIONS:  1. Normal left ventricular systolic function.  2. Peripheral vascular disease, as described.  3. One-vessel coronary artery disease with significant stenosis in the distal  left  anterior descending artery. This appears to correspond with her EKG changes  and  Cardiolite scan. She has moderate, but non-flow limiting disease in the  right coronary and left circumflex.  PLAN: Percutaneous intervention of the distal LAD, see below.  PTCA PROCEDURAL NOTE: Following the completion of the diagnostic  catheterization,  we opted to proceed with percutaneous intervention. The preexisting 6-French  sheath in the right femoral artery was exchanged over a wire for a 7-French  sheath.  Integrilin and heparin were administered per protocol. We used a 7-French JL-4  guiding catheter and a BMW wire. The reference vessel was approximately 2 mm  in  diameter. We utilized a 2.0 x 20 mm CrossSail balloon, which was inflated  initially to 12 atm x 2 minutes and then 14 atm x 3 minutes. Final  angiographic  images revealed significant improvement in the  lumen with 25% residual stenosis  and  a possible very small non-flow limiting dissection. There was TIMI-3 flow into  the  distal vessel.  COMPLICATIONS: None.  RESULTS: Successful percutaneous transluminal coronary angioplasty of the  distal  left anterior descending artery, reducing  an 80% stenosis to 25% residual with  TIMI-3 flow.  PLAN: Integrilin will be continued for 20 hours. The patient needs aggressive  blood pressure control and risk factor reduction.  In regards to her peripheral vascular disease, she will be referred for further  evaluation and possible intervention.  03/2012 Echo LVEF 60-65%, no significant abnormalities   02/19/16 Clinic EKG (performed and reviewed in clinic): sinus bradycardia, no ischemic changes      Assessment and Plan  1. HTN - difficult to control, mainlydue to medication side effects as described above -home bp's are reasonable given her medication limitations due to side effects and prior issues with orthostatic dizziness, continue current meds  2. CAD - recent exertional chest pain - she is not in favor of pharmacological stress testing due to side effects, but open to coronary CTA which we will order  3. PAD - no symptoms, continue current meds   4. Hyperlipidemia -she will continue statin     F/u 6 months      [name], M.D.

## 2018-01-05 ENCOUNTER — Encounter: Payer: Self-pay | Admitting: Cardiology

## 2018-01-05 ENCOUNTER — Encounter: Payer: Self-pay | Admitting: *Deleted

## 2018-02-28 ENCOUNTER — Ambulatory Visit (HOSPITAL_COMMUNITY)
Admission: RE | Admit: 2018-02-28 | Discharge: 2018-02-28 | Disposition: A | Discharge status: Home / Self Care | Payer: Medicare Other | Source: Ambulatory Visit | Attending: Cardiology | Admitting: Cardiology

## 2018-02-28 DIAGNOSIS — I25119 Atherosclerotic heart disease of native coronary artery with unspecified angina pectoris: Secondary | ICD-10-CM

## 2018-02-28 DIAGNOSIS — R079 Chest pain, unspecified: Secondary | ICD-10-CM | POA: Insufficient documentation

## 2018-02-28 DIAGNOSIS — R911 Solitary pulmonary nodule: Secondary | ICD-10-CM | POA: Insufficient documentation

## 2018-02-28 LAB — POCT I-STAT CREATININE: Creatinine, Ser: 0.6 mg/dL (ref 0.44–1.00)

## 2018-02-28 MED ORDER — METOPROLOL TARTRATE 5 MG/5ML IV SOLN
5.0000 mg | Freq: Once | INTRAVENOUS | Status: AC
  Administered 2018-02-28: 5 mg via INTRAVENOUS
  Filled 2018-02-28: qty 5

## 2018-02-28 MED ORDER — NITROGLYCERIN 0.4 MG SL SUBL
SUBLINGUAL_TABLET | SUBLINGUAL | Status: AC
  Filled 2018-02-28: qty 2

## 2018-02-28 MED ORDER — IOPAMIDOL (ISOVUE-370) INJECTION 76%
INTRAVENOUS | Status: AC
  Administered 2018-02-28: 80 mL via INTRAVENOUS
  Filled 2018-02-28: qty 100

## 2018-02-28 MED ORDER — METOPROLOL TARTRATE 5 MG/5ML IV SOLN
INTRAVENOUS | Status: AC
  Filled 2018-02-28: qty 5

## 2018-02-28 MED ORDER — NITROGLYCERIN 0.4 MG SL SUBL
0.8000 mg | SUBLINGUAL_TABLET | SUBLINGUAL | Status: DC | PRN
  Administered 2018-02-28: 0.8 mg via SUBLINGUAL
  Filled 2018-02-28: qty 25

## 2018-03-06 ENCOUNTER — Telehealth: Payer: Self-pay | Admitting: *Deleted

## 2018-03-06 NOTE — Telephone Encounter (Signed)
Pt says she has some burning on the left side of her chest roughly 3 times weekly with some SOB - says her arm is black from the needle when she was having coronary CT - denies any new or worsening symptoms at this time

## 2018-03-06 NOTE — Telephone Encounter (Signed)
-----   Message from [name], MD sent at 03/02/2018  4:04 PM EDT ----- Final result of CT is available, there is evidence of some blockages in her heart. How are her symptoms doing, depending on that we may need to consider a repeat heart cath  J BrancH MD

## 2018-03-07 NOTE — Telephone Encounter (Signed)
Can I see her tomorrow at 940 in clinic. We need to decide on whetther or not she needs a cath.  Zandra Abts MD

## 2018-03-07 NOTE — Telephone Encounter (Signed)
Pt aware and will come for appt tomorrow - will have schedulers add her

## 2018-03-08 ENCOUNTER — Encounter: Payer: Self-pay | Admitting: *Deleted

## 2018-03-08 ENCOUNTER — Encounter: Payer: Self-pay | Admitting: Cardiology

## 2018-03-08 ENCOUNTER — Telehealth: Payer: Self-pay | Admitting: Cardiovascular Disease

## 2018-03-08 ENCOUNTER — Ambulatory Visit (INDEPENDENT_AMBULATORY_CARE_PROVIDER_SITE_OTHER): Payer: Medicare Other | Admitting: Cardiology

## 2018-03-08 ENCOUNTER — Telehealth: Payer: Self-pay | Admitting: Cardiology

## 2018-03-08 VITALS — BP 206/70 | HR 61 | Ht 65.0 in | Wt 170.6 lb

## 2018-03-08 DIAGNOSIS — I10 Essential (primary) hypertension: Secondary | ICD-10-CM | POA: Diagnosis not present

## 2018-03-08 DIAGNOSIS — Z01812 Encounter for preprocedural laboratory examination: Secondary | ICD-10-CM | POA: Diagnosis not present

## 2018-03-08 DIAGNOSIS — R9439 Abnormal result of other cardiovascular function study: Secondary | ICD-10-CM

## 2018-03-08 DIAGNOSIS — R079 Chest pain, unspecified: Secondary | ICD-10-CM | POA: Diagnosis not present

## 2018-03-08 NOTE — Progress Notes (Signed)
Clinical Summary [name] [name] is a 77 y.o.female seen today for follow up of the following medical problems.    1. HTN - history of difficult to treat HTN, primarily due to reported medication side effects to several agents.  - history of angioedema on ACE-I and most recently on ARB.  - she reports amlodopine caused leg swelling(she had been on 43m). Clonidine caused dizziness.  - we had started chlrothalidone 519mdaily. She had some muscle cramping, and the dose was decreased to 2576maily, then 51m61md and then 12.5mg 27m.  - dizziness after starting hydralazine, pins and needles feelings in ears with laying down. We have tried continuing this medication. Same side effect reported on nitrates, discontinued.  - she reports alopecia possibly due to aldactone  - Borderline low HRs,initially hesistant to try beta blockers but last visit started labetalol - swelling started about 2 weeks after starting norvasc. L>R LE edema. Mild SOB outside in heat. No change in weight.    - home bp's 130-140s/60s. Her cuff has been validated by ours. Significant component of white coat HTN.   2. CAD - prior PTCA to distal LAD in 2001. 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  - echo 06/2012 LVEF 60-65% - DSE 06/2012 with reported ST elevation in inferior leads, negative echo images for ischemia.    - has been working out at the [employer], 2nd time around gets chest pain and has to stop - pressure like entire chest, 4/10 in severity. +SOB. She stops and rests, and then pain resolves in just a few seconds. - unable to run on treadmill. Reports signidicant symptoms with DSE in the past. In general wished to avoid pharmacological stress if possible due to side effects  02/2018 coronary CTA suggests severe RCA disease, abnormal FFR in distal LAD and LCX as well.  - mild chest discomfort left sided, burning pain with  second lap of her walks. No significant SOB. Stops and pain resolves. DOes not occur at rest.    3. PAD - prior iliac stent in July 2001 - [place] 9/[place] Right ABI 0.99 Left ABI 0.99, with patent left iliac stent.  - no recent leg pains.   4. Hyperlipidemia -she is compliant wit hmeds     Past Medical History:  Diagnosis Date  . 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 Diabetes mellitus   . Hypercholesterolemia   . Hypertension   . Peripheral vascular disease (HCC) [place] status post iliac stent July 2001      Allergies  Allergen Reactions  . Clonidine Derivatives Other (See Comments)    dizziness  . Lisinopril Other (See Comments)    Tongue swelling  . Losartan     TONGUE SWELLING  . Codeine Nausea And Vomiting  . Hydrocodone Nausea And Vomiting     Current Outpatient Medications  Medication Sig Dispense Refill  . amLODipine (NORVASC) 5 MG tablet TAKE (1) TABLET BY MOUTH ONCE DAILY. 30 tablet 3  . aspirin 81 MG tablet Take 81 mg by mouth at bedtime.     . betamethasone dipropionate (DIPROLENE) 0.05 % cream Apply 1 application topically 2 (two) times daily.    . bimatoprost (LUMIGAN) 0.01 % SOLN Place 1 drop into both eyes at bedtime.    . Biotin (BIOTIN 5000) 5 MG CAPS  Take 1 capsule by mouth daily.    . Calcium Carbonate-Vit D-Min (CALCIUM 1200 PO) Take 1 capsule by mouth daily.    . chlorthalidone (HYGROTON) 25 MG tablet Take 0.5 tablets (12.5 mg total) by mouth 2 (two) times a week. On Monday and Fridays 9 tablet 3  . ENDOCET 10-325 MG per tablet Take 1 tablet by mouth 2 (two) times daily.     . hydrALAZINE (APRESOLINE) 25 MG tablet TAKE 1 & 1/2 TABLETS 3 TIMES DAILY. 135 tablet 6  . insulin glargine (LANTUS) 100 UNIT/ML injection Take 12 units every morning & 42 units every evening    . insulin lispro (HUMALOG) 100 UNIT/ML injection Inject 10-16 Units into the skin 3  (three) times daily with meals.    [name] labetalol (NORMODYNE) 100 MG tablet TAKE (1) TABLET TWICE DAILY. 60 tablet 0  . labetalol (NORMODYNE) 100 MG tablet TAKE (1) TABLET TWICE DAILY. 60 tablet 0  . labetalol (NORMODYNE) 100 MG tablet TAKE (1) TABLET TWICE DAILY. 60 tablet 6  . metFORMIN (GLUMETZA) 1000 MG (MOD) 24 hr tablet Take 1,000 mg by mouth 2 (two) times daily.    [name] omeprazole (PRILOSEC) 20 MG capsule Take 20 mg by mouth 2 (two) times daily before a meal.     . ONE TOUCH ULTRA TEST test strip     . polyethylene glycol (MIRALAX / GLYCOLAX) packet Take 17 g by mouth daily as needed for moderate constipation.     . rosuvastatin (CRESTOR) 10 MG tablet Take 10 mg by mouth at bedtime.     . SYMBICORT 160-4.5 MCG/ACT inhaler Inhale 2 puffs into the lungs 2 (two) times daily.      No current facility-administered medications for this visit.      Past Surgical History:  Procedure Laterality Date  . CERVICAL FUSION    . CHOLECYSTECTOMY    . CORONARY STENT PLACEMENT    . stent in r leg       Allergies  Allergen Reactions  . Clonidine Derivatives Other (See Comments)    dizziness  . Lisinopril Other (See Comments)    Tongue swelling  . Losartan     TONGUE SWELLING  . Codeine Nausea And Vomiting  . Hydrocodone Nausea And Vomiting      Family History  Problem Relation Age of Onset  . Heart failure Father   . Heart failure Mother   . Diabetes Mother   . COPD Brother      Social History [name] [name] reports that she quit smoking about 22 years ago. Her smoking use included cigarettes. She started smoking about 56 years ago. She has a 5.00 pack-year smoking history. She has never used smokeless tobacco. [name] [name] reports that she does not drink alcohol.   Review of Systems CONSTITUTIONAL: No weight loss, fever, chills, weakness or fatigue.  HEENT: Eyes: No visual loss, blurred vision, double vision or yellow sclerae.No hearing loss, sneezing, congestion, runny nose or sore  throat.  SKIN: No rash or itching.  CARDIOVASCULAR: per hpi RESPIRATORY: per hpi  GASTROINTESTINAL: No anorexia, nausea, vomiting or diarrhea. No abdominal pain or blood.  GENITOURINARY: No burning on urination, no polyuria NEUROLOGICAL: No headache, dizziness, syncope, paralysis, ataxia, numbness or tingling in the extremities. No change in bowel or bladder control.  MUSCULOSKELETAL: No muscle, back pain, joint pain or stiffness.  LYMPHATICS: No enlarged nodes. No history of splenectomy.  PSYCHIATRIC: No history of depression or anxiety.  ENDOCRINOLOGIC: No reports of sweating, cold or heat intolerance.  No polyuria or polydipsia.  [name]   Physical Examination Vitals:   03/08/18 0944 03/08/18 0946  BP: (!) 206/65 (!) 206/70  Pulse: 61 61  SpO2: 98% 99%   Vitals:   03/08/18 0938  Weight: 170 lb 9.6 oz (77.4 kg)  Height: _0  (1.651 m)    Gen: resting comfortably, no acute distress HEENT: no scleral icterus, pupils equal round and reactive, no palptable cervical adenopathy,  CV: RRR, no m/r/g, no jvd Resp: Clear to auscultation bilaterally GI: abdomen is soft, non-tender, non-distended, normal bowel sounds, no hepatosplenomegaly MSK: extremities are warm, no edema.  Skin: warm, no rash Neuro:  no focal deficits Psych: appropriate affect   Diagnostic Studies 11/1999 Cath HEMODYNAMIC DATA: Aortic pressure was initially extremely high at 215/92. She  was  given intravenous enalaprilat, intravenous labetalol, and intravenous  nitroglycerin, ultimately bringing her systolic blood pressure down to less  than  160. Left ventricular pressure was 180/17, with a corresponding aortic  pressure of  180/80. There was no aortic valve gradient.  LEFT VENTRICULOGRAM: Wall motion was normal. Ejection fraction was estimated  t  65%. No mitral regurgitation.  ABDOMINAL AORTOGRAM: This revealed patent renal arteries and abdominal aorta.  The  left common iliac artery had a 75%  stenosis. The right external iliac artery  had a  40% stenosis.  CORONARY ARTERIOGRAPHY (Right dominant):  1. The left main was normal.  2. The left anterior descending artery had a 25% stenosis in the proximal to  mid  vessel. The distal LAD had a 50% stenosis just after the bifurcation of the  large second diagonal Makendra Vigeant. Further down the distal LAD is a focal 80%  stenosis.  3. The left circumflex had a 20% stenosis in the mid vessel. It gave rise to a  small OM-1, a normal sized OM-2 which had a 25% stenosis, and a normal sized  OM-3 which had a 20% stenosis.  4. The right coronary artery had a diffuse 40% stenosis extending from the mid  o  distal vessel. There was a large posterior descending artery which had a  25%,  followed by a 40% stenosis. There were two small posterolateral branches.  IMPRESSIONS:  1. Normal left ventricular systolic function.  2. Peripheral vascular disease, as described.  3. One-vessel coronary artery disease with significant stenosis in the distal  left  anterior descending artery. This appears to correspond with her EKG changes  and  Cardiolite scan. She has moderate, but non-flow limiting disease in the  right coronary and left circumflex.  PLAN: Percutaneous intervention of the distal LAD, see below.  PTCA PROCEDURAL NOTE: Following the completion of the diagnostic  catheterization,  we opted to proceed with percutaneous intervention. The preexisting 6-French  sheath in the right femoral artery was exchanged over a wire for a 7-French  sheath.  Integrilin and heparin were administered per protocol. We used a 7-French JL-4  guiding catheter and a BMW wire. The reference vessel was approximately 2 mm  in  diameter. We utilized a 2.0 x 20 mm CrossSail balloon, which was inflated  initially to 12 atm x 2 minutes and then 14 atm x 3 minutes. Final  angiographic  images revealed significant improvement in the  lumen with 25% residual stenosis  and  a possible very small non-flow limiting dissection. There was TIMI-3 flow into  the  distal vessel.  COMPLICATIONS: None.  RESULTS: Successful percutaneous transluminal coronary angioplasty of the  distal  left anterior descending artery,  reducing an 80% stenosis to 25% residual with  TIMI-3 flow.  PLAN: Integrilin will be continued for 20 hours. The patient needs aggressive  blood pressure control and risk factor reduction.  In regards to her peripheral vascular disease, she will be referred for further  evaluation and possible intervention.  03/2012 Echo LVEF 60-65%, no significant abnormalities   02/19/16 Clinic EKG (performed and reviewed in clinic): sinus bradycardia, no ischemic changes   02/2018 coronary CTA/FFR FINDINGS: FFR < 0.5 distal RCA suggesting severe mid RCA stenosis.  FFR 0.83 mid LAD suggesting non-hemodynamically significant mid LAD stenosis.  FFR 0.66 distal LAD suggesting significant distal LAD stenosis (versus cumulative effect of diffuse disease in LAD).  FFR 0.67 distal LCx suggesting significant stenosis mid to distal LCx (versus cumulative effect of diffuse disease in LCx).  IMPRESSION: 1.  Suspect severe mid RCA stenosis.  2. FFR significantly low in distal LAD and distal LCx. This could suggest a single hemodynamically significant stenosis versus the cumulative effect of a long area of disease.  Suggest cardiac cath.    Assessment and Plan   1., HTN - severeley elevated in clinic, home numbers are at goal. Her home cuff has been validated with our previously - significant white coat HTN, continue current meds  2. Chest pain - abnormal coroanry CTA, ongoing symptoms. We will plan for cath.  3. Hyperlipidemia - continue statin  I have reviewed the risks, indications, and alternatives to cardiac catheterization, possible angioplasty, and stenting with the patient today.  Risks include but are not limited to bleeding, infection, vascular injury, stroke, myocardial infection, arrhythmia, kidney injury, radiation-related injury in the case of prolonged fluoroscopy use, emergency cardiac surgery, and death. The patient understands the risks of serious complication is 1-2 in 0623 with diagnostic cardiac cath and 1-2% or less with angioplasty/stenting.      [name], M.D.,

## 2018-03-08 NOTE — Telephone Encounter (Signed)
Patient informed that she will go to Short Stay at [facility] and given [phone].

## 2018-03-08 NOTE — Telephone Encounter (Signed)
Patient asking for the dept number where she is going to have her CATH for [facility] [place] [facility]

## 2018-03-08 NOTE — Patient Instructions (Signed)
Medication Instructions:  Continue all current medications.  Labwork: CBC, BMET - orders given today.  Testing/Procedures: Your physician has requested that you have a cardiac catheterization. Cardiac catheterization is used to diagnose and/or treat various heart conditions. Doctors may recommend this procedure for a number of different reasons. The most common reason is to evaluate chest pain. Chest pain can be a symptom of coronary artery disease (CAD), and cardiac catheterization can show whether plaque is narrowing or blocking your heart's arteries. This procedure is also used to evaluate the valves, as well as measure the blood flow and oxygen levels in different parts of your heart. For further information please visit [email or web address]. Please follow instruction sheet, as given.  Follow-Up: 1 month   Any Other Special Instructions Will Be Listed Below (If Applicable).  If you need a refill on your cardiac medications before your next appointment, please call your pharmacy.

## 2018-03-08 NOTE — H&P (View-Only) (Signed)
Clinical Summary [name] [name] is a 77 y.o.female seen today for follow up of the following medical problems.    1. HTN - history of difficult to treat HTN, primarily due to reported medication side effects to several agents.  - history of angioedema on ACE-I and most recently on ARB.  - [name] [name] reports amlodopine caused leg swelling([name] [name] had been on 43m). Clonidine caused dizziness.  - we had started chlrothalidone 519mdaily. [name] [name] had some muscle cramping, and the dose was decreased to 2576maily, then 51m61md and then 12.5mg 27m.  - dizziness after starting hydralazine, pins and needles feelings in ears with laying down. We have tried continuing this medication. Same side effect reported on nitrates, discontinued.  - [name] [name] reports alopecia possibly due to aldactone  - Borderline low HRs,initially hesistant to try beta blockers but last visit started labetalol - swelling started about 2 weeks after starting norvasc. L>R LE edema. Mild SOB outside in heat. No change in weight.    - home bp's 130-140s/60s. [name] [name] cuff has been validated by ours. Significant component of white coat HTN.   2. CAD - prior PTCA to distal LAD in 2001. 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  - echo 06/2012 LVEF 60-65% - DSE 06/2012 with reported ST elevation in inferior leads, negative echo images for ischemia.    - has been working out at the [employer], 2nd time around gets chest pain and has to stop - pressure like entire chest, 4/10 in severity. +SOB. [name] [name] stops and rests, and then pain resolves in just a few seconds. - unable to run on treadmill. Reports signidicant symptoms with DSE in the past. In general wished to avoid pharmacological stress if possible due to side effects  02/2018 coronary CTA suggests severe RCA disease, abnormal FFR in distal LAD and LCX as well.  - mild chest discomfort left sided, burning pain with  second lap of [name] [name] walks. No significant SOB. Stops and pain resolves. DOes not occur at rest.    3. PAD - prior iliac stent in July 2001 - [place] 9/[place] Right ABI 0.99 Left ABI 0.99, with patent left iliac stent.  - no recent leg pains.   4. Hyperlipidemia -[name] [name] is compliant wit hmeds     Past Medical History:  Diagnosis Date  . 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 Diabetes mellitus   . Hypercholesterolemia   . Hypertension   . Peripheral vascular disease (HCC) [place] status post iliac stent July 2001      Allergies  Allergen Reactions  . Clonidine Derivatives Other (See Comments)    dizziness  . Lisinopril Other (See Comments)    Tongue swelling  . Losartan     TONGUE SWELLING  . Codeine Nausea And Vomiting  . Hydrocodone Nausea And Vomiting     Current Outpatient Medications  Medication Sig Dispense Refill  . amLODipine (NORVASC) 5 MG tablet TAKE (1) TABLET BY MOUTH ONCE DAILY. 30 tablet 3  . aspirin 81 MG tablet Take 81 mg by mouth at bedtime.     . betamethasone dipropionate (DIPROLENE) 0.05 % cream Apply 1 application topically 2 (two) times daily.    . bimatoprost (LUMIGAN) 0.01 % SOLN Place 1 drop into both eyes at bedtime.    . Biotin (BIOTIN 5000) 5 MG CAPS  Take 1 capsule by mouth daily.    . Calcium Carbonate-Vit D-Min (CALCIUM 1200 PO) Take 1 capsule by mouth daily.    . chlorthalidone (HYGROTON) 25 MG tablet Take 0.5 tablets (12.5 mg total) by mouth 2 (two) times a week. On Monday and Fridays 9 tablet 3  . ENDOCET 10-325 MG per tablet Take 1 tablet by mouth 2 (two) times daily.     . hydrALAZINE (APRESOLINE) 25 MG tablet TAKE 1 & 1/2 TABLETS 3 TIMES DAILY. 135 tablet 6  . insulin glargine (LANTUS) 100 UNIT/ML injection Take 12 units every morning & 42 units every evening    . insulin lispro (HUMALOG) 100 UNIT/ML injection Inject 10-16 Units into the skin 3  (three) times daily with meals.    . labetalol (NORMODYNE) 100 MG tablet TAKE (1) TABLET TWICE DAILY. 60 tablet 0  . labetalol (NORMODYNE) 100 MG tablet TAKE (1) TABLET TWICE DAILY. 60 tablet 0  . labetalol (NORMODYNE) 100 MG tablet TAKE (1) TABLET TWICE DAILY. 60 tablet 6  . metFORMIN (GLUMETZA) 1000 MG (MOD) 24 hr tablet Take 1,000 mg by mouth 2 (two) times daily.    . omeprazole (PRILOSEC) 20 MG capsule Take 20 mg by mouth 2 (two) times daily before a meal.     . ONE TOUCH ULTRA TEST test strip     . polyethylene glycol (MIRALAX / GLYCOLAX) packet Take 17 g by mouth daily as needed for moderate constipation.     . rosuvastatin (CRESTOR) 10 MG tablet Take 10 mg by mouth at bedtime.     . SYMBICORT 160-4.5 MCG/ACT inhaler Inhale 2 puffs into the lungs 2 (two) times daily.      No current facility-administered medications for this visit.      Past Surgical History:  Procedure Laterality Date  . CERVICAL FUSION    . CHOLECYSTECTOMY    . CORONARY STENT PLACEMENT    . stent in r leg       Allergies  Allergen Reactions  . Clonidine Derivatives Other (See Comments)    dizziness  . Lisinopril Other (See Comments)    Tongue swelling  . Losartan     TONGUE SWELLING  . Codeine Nausea And Vomiting  . Hydrocodone Nausea And Vomiting      Family History  Problem Relation Age of Onset  . Heart failure Father   . Heart failure Mother   . Diabetes Mother   . COPD Brother      Social History Ms. Drolet reports that [name] [name] quit smoking about 22 years ago. [name] [name] smoking use included cigarettes. [name] [name] started smoking about 56 years ago. [name] [name] has a 5.00 pack-year smoking history. [name] [name] has never used smokeless tobacco. Ms. Mcfall reports that [name] [name] does not drink alcohol.   Review of Systems CONSTITUTIONAL: No weight loss, fever, chills, weakness or fatigue.  HEENT: Eyes: No visual loss, blurred vision, double vision or yellow sclerae.No hearing loss, sneezing, congestion, runny nose or sore  throat.  SKIN: No rash or itching.  CARDIOVASCULAR: per hpi RESPIRATORY: per hpi  GASTROINTESTINAL: No anorexia, nausea, vomiting or diarrhea. No abdominal pain or blood.  GENITOURINARY: No burning on urination, no polyuria NEUROLOGICAL: No headache, dizziness, syncope, paralysis, ataxia, numbness or tingling in the extremities. No change in bowel or bladder control.  MUSCULOSKELETAL: No muscle, back pain, joint pain or stiffness.  LYMPHATICS: No enlarged nodes. No history of splenectomy.  PSYCHIATRIC: No history of depression or anxiety.  ENDOCRINOLOGIC: No reports of sweating, cold or heat intolerance.   No polyuria or polydipsia.  .   Physical Examination Vitals:   03/08/18 0944 03/08/18 0946  BP: (!) 206/65 (!) 206/70  Pulse: 61 61  SpO2: 98% 99%   Vitals:   03/08/18 0938  Weight: 170 lb 9.6 oz (77.4 kg)  Height: 5' 5" (1.651 m)    Gen: resting comfortably, no acute distress HEENT: no scleral icterus, pupils equal round and reactive, no palptable cervical adenopathy,  CV: RRR, no m/r/g, no jvd Resp: Clear to auscultation bilaterally GI: abdomen is soft, non-tender, non-distended, normal bowel sounds, no hepatosplenomegaly MSK: extremities are warm, no edema.  Skin: warm, no rash Neuro:  no focal deficits Psych: appropriate affect   Diagnostic Studies 11/1999 Cath HEMODYNAMIC DATA: Aortic pressure was initially extremely high at 215/92. [name] [name]  was  given intravenous enalaprilat, intravenous labetalol, and intravenous  nitroglycerin, ultimately bringing [name] [name] systolic blood pressure down to less  than  160. Left ventricular pressure was 180/17, with a corresponding aortic  pressure of  180/80. There was no aortic valve gradient.  LEFT VENTRICULOGRAM: Wall motion was normal. Ejection fraction was estimated  t  65%. No mitral regurgitation.  ABDOMINAL AORTOGRAM: This revealed patent renal arteries and abdominal aorta.  The  left common iliac artery had a 75%  stenosis. The right external iliac artery  had a  40% stenosis.  CORONARY ARTERIOGRAPHY (Right dominant):  1. The left main was normal.  2. The left anterior descending artery had a 25% stenosis in the proximal to  mid  vessel. The distal LAD had a 50% stenosis just after the bifurcation of the  large second diagonal Krystal Delduca. Further down the distal LAD is a focal 80%  stenosis.  3. The left circumflex had a 20% stenosis in the mid vessel. It gave rise to a  small OM-1, a normal sized OM-2 which had a 25% stenosis, and a normal sized  OM-3 which had a 20% stenosis.  4. The right coronary artery had a diffuse 40% stenosis extending from the mid  o  distal vessel. There was a large posterior descending artery which had a  25%,  followed by a 40% stenosis. There were two small posterolateral branches.  IMPRESSIONS:  1. Normal left ventricular systolic function.  2. Peripheral vascular disease, as described.  3. One-vessel coronary artery disease with significant stenosis in the distal  left  anterior descending artery. This appears to correspond with [name] [name] EKG changes  and  Cardiolite scan. [name] [name] has moderate, but non-flow limiting disease in the  right coronary and left circumflex.  PLAN: Percutaneous intervention of the distal LAD, see below.  PTCA PROCEDURAL NOTE: Following the completion of the diagnostic  catheterization,  we opted to proceed with percutaneous intervention. The preexisting 6-French  sheath in the right femoral artery was exchanged over a wire for a 7-French  sheath.  Integrilin and heparin were administered per protocol. We used a 7-French JL-4  guiding catheter and a BMW wire. The reference vessel was approximately 2 mm  in  diameter. We utilized a 2.0 x 20 mm CrossSail balloon, which was inflated  initially to 12 atm x 2 minutes and then 14 atm x 3 minutes. Final  angiographic  images revealed significant improvement in the  lumen with 25% residual stenosis  and  a possible very small non-flow limiting dissection. There was TIMI-3 flow into  the  distal vessel.  COMPLICATIONS: None.  RESULTS: Successful percutaneous transluminal coronary angioplasty of the  distal  left anterior descending artery,   reducing an 80% stenosis to 25% residual with  TIMI-3 flow.  PLAN: Integrilin will be continued for 20 hours. The patient needs aggressive  blood pressure control and risk factor reduction.  In regards to [name] [name] peripheral vascular disease, [name] [name] will be referred for further  evaluation and possible intervention.  03/2012 Echo LVEF 60-65%, no significant abnormalities   02/19/16 Clinic EKG (performed and reviewed in clinic): sinus bradycardia, no ischemic changes   02/2018 coronary CTA/FFR FINDINGS: FFR < 0.5 distal RCA suggesting severe mid RCA stenosis.  FFR 0.83 mid LAD suggesting non-hemodynamically significant mid LAD stenosis.  FFR 0.66 distal LAD suggesting significant distal LAD stenosis (versus cumulative effect of diffuse disease in LAD).  FFR 0.67 distal LCx suggesting significant stenosis mid to distal LCx (versus cumulative effect of diffuse disease in LCx).  IMPRESSION: 1.  Suspect severe mid RCA stenosis.  2. FFR significantly low in distal LAD and distal LCx. This could suggest a single hemodynamically significant stenosis versus the cumulative effect of a long area of disease.  Suggest cardiac cath.    Assessment and Plan   1., HTN - severeley elevated in clinic, home numbers are at goal. [name] [name] home cuff has been validated with our previously - significant white coat HTN, continue current meds  2. Chest pain - abnormal coroanry CTA, ongoing symptoms. We will plan for cath.  3. Hyperlipidemia - continue statin  I have reviewed the risks, indications, and alternatives to cardiac catheterization, possible angioplasty, and stenting with the patient today.  Risks include but are not limited to bleeding, infection, vascular injury, stroke, myocardial infection, arrhythmia, kidney injury, radiation-related injury in the case of prolonged fluoroscopy use, emergency cardiac surgery, and death. The patient understands the risks of serious complication is 1-2 in 0623 with diagnostic cardiac cath and 1-2% or less with angioplasty/stenting.      [name], M.D.,

## 2018-03-08 NOTE — Telephone Encounter (Signed)
Pre-cert Verification for the following procedure   Left heart cath - varanasi - 6/12 - 11:30

## 2018-03-13 ENCOUNTER — Encounter: Payer: Self-pay | Admitting: *Deleted

## 2018-03-13 ENCOUNTER — Telehealth: Payer: Self-pay | Admitting: *Deleted

## 2018-03-13 ENCOUNTER — Other Ambulatory Visit: Payer: Self-pay | Admitting: Cardiology

## 2018-03-13 NOTE — Telephone Encounter (Signed)
Pt contacted pre-catheterization scheduled at [facility] for: Wednesday March 15, 2018 11:30 AM Verified arrival time and place: [place] Entrance A at: 9 AM  No solid food after midnight prior to cath, clear liquids until 5 AM day of procedure. Verified allergies in Epic  Hold: Metformin 03/15/18 and 48 hours post procedure. Insulin AM of procedure. 1/2 usual Insulin PM prior to procedure.   Except hold medications AM meds can be  taken pre-cath with sip of water including: ASA 81 mg   Confirmed patient has responsible person to drive home post procedure and observe patient for 24 hours: yes  NOTE: Pt states when automatic BP machine is used to check her BP the cuff is too tight when inflated,  causes numbness and tingling in her hands and fingers, causes a false high BP reading.

## 2018-03-14 ENCOUNTER — Telehealth: Payer: Self-pay | Admitting: *Deleted

## 2018-03-14 ENCOUNTER — Encounter: Payer: Self-pay | Admitting: Cardiology

## 2018-03-14 ENCOUNTER — Other Ambulatory Visit: Payer: Self-pay | Admitting: Cardiology

## 2018-03-14 DIAGNOSIS — R0789 Other chest pain: Secondary | ICD-10-CM

## 2018-03-14 NOTE — Telephone Encounter (Signed)
-----   Message from [name], MD sent at 03/14/2018  1:26 PM EDT ----- Labs look good  Zandra Abts MD

## 2018-03-14 NOTE — Telephone Encounter (Signed)
Pt aware.

## 2018-03-15 ENCOUNTER — Encounter (HOSPITAL_COMMUNITY)
Admission: RE | Disposition: A | Discharge status: Home / Self Care | Payer: Self-pay | Source: Ambulatory Visit | Attending: Interventional Cardiology

## 2018-03-15 ENCOUNTER — Encounter (HOSPITAL_COMMUNITY): Payer: Self-pay | Admitting: Interventional Cardiology

## 2018-03-15 ENCOUNTER — Ambulatory Visit (HOSPITAL_COMMUNITY)
Admission: RE | Admit: 2018-03-15 | Discharge: 2018-03-16 | Disposition: A | Discharge status: Home / Self Care | Payer: Medicare Other | Source: Ambulatory Visit | Attending: Interventional Cardiology | Admitting: Interventional Cardiology

## 2018-03-15 ENCOUNTER — Other Ambulatory Visit: Payer: Self-pay

## 2018-03-15 DIAGNOSIS — Z955 Presence of coronary angioplasty implant and graft: Secondary | ICD-10-CM | POA: Diagnosis not present

## 2018-03-15 DIAGNOSIS — Z79899 Other long term (current) drug therapy: Secondary | ICD-10-CM | POA: Diagnosis not present

## 2018-03-15 DIAGNOSIS — Z794 Long term (current) use of insulin: Secondary | ICD-10-CM | POA: Diagnosis not present

## 2018-03-15 DIAGNOSIS — Z9049 Acquired absence of other specified parts of digestive tract: Secondary | ICD-10-CM | POA: Diagnosis not present

## 2018-03-15 DIAGNOSIS — Z888 Allergy status to other drugs, medicaments and biological substances status: Secondary | ICD-10-CM | POA: Insufficient documentation

## 2018-03-15 DIAGNOSIS — Z7982 Long term (current) use of aspirin: Secondary | ICD-10-CM | POA: Diagnosis not present

## 2018-03-15 DIAGNOSIS — Z885 Allergy status to narcotic agent status: Secondary | ICD-10-CM | POA: Insufficient documentation

## 2018-03-15 DIAGNOSIS — I25119 Atherosclerotic heart disease of native coronary artery with unspecified angina pectoris: Secondary | ICD-10-CM | POA: Insufficient documentation

## 2018-03-15 DIAGNOSIS — Z8249 Family history of ischemic heart disease and other diseases of the circulatory system: Secondary | ICD-10-CM | POA: Insufficient documentation

## 2018-03-15 DIAGNOSIS — R0789 Other chest pain: Secondary | ICD-10-CM

## 2018-03-15 DIAGNOSIS — E785 Hyperlipidemia, unspecified: Secondary | ICD-10-CM | POA: Diagnosis present

## 2018-03-15 DIAGNOSIS — E1159 Type 2 diabetes mellitus with other circulatory complications: Secondary | ICD-10-CM | POA: Diagnosis present

## 2018-03-15 DIAGNOSIS — Z87891 Personal history of nicotine dependence: Secondary | ICD-10-CM | POA: Insufficient documentation

## 2018-03-15 DIAGNOSIS — I209 Angina pectoris, unspecified: Secondary | ICD-10-CM | POA: Diagnosis present

## 2018-03-15 DIAGNOSIS — I252 Old myocardial infarction: Secondary | ICD-10-CM | POA: Diagnosis not present

## 2018-03-15 DIAGNOSIS — Z981 Arthrodesis status: Secondary | ICD-10-CM | POA: Insufficient documentation

## 2018-03-15 DIAGNOSIS — Z7951 Long term (current) use of inhaled steroids: Secondary | ICD-10-CM | POA: Diagnosis not present

## 2018-03-15 DIAGNOSIS — I10 Essential (primary) hypertension: Secondary | ICD-10-CM | POA: Diagnosis not present

## 2018-03-15 HISTORY — PX: CORONARY STENT INTERVENTION: CATH118234

## 2018-03-15 HISTORY — DX: Atherosclerotic heart disease of native coronary artery without angina pectoris: I25.10

## 2018-03-15 HISTORY — DX: Depression, unspecified: F32.A

## 2018-03-15 HISTORY — DX: Anxiety disorder, unspecified: F41.9

## 2018-03-15 HISTORY — DX: Chronic obstructive pulmonary disease, unspecified: J44.9

## 2018-03-15 HISTORY — DX: Type 2 diabetes mellitus without complications: E11.9

## 2018-03-15 HISTORY — PX: LEFT HEART CATH AND CORONARY ANGIOGRAPHY: CATH118249

## 2018-03-15 HISTORY — DX: Major depressive disorder, single episode, unspecified: F32.9

## 2018-03-15 HISTORY — DX: Unspecified osteoarthritis, unspecified site: M19.90

## 2018-03-15 HISTORY — DX: Gastro-esophageal reflux disease without esophagitis: K21.9

## 2018-03-15 LAB — GLUCOSE, CAPILLARY
GLUCOSE-CAPILLARY: 248 mg/dL — AB (ref 65–99)
Glucose-Capillary: 327 mg/dL — ABNORMAL HIGH (ref 65–99)
Glucose-Capillary: 235 mg/dL — ABNORMAL HIGH (ref 65–99)
Glucose-Capillary: 222 mg/dL — ABNORMAL HIGH (ref 65–99)

## 2018-03-15 LAB — POCT ACTIVATED CLOTTING TIME: Activated Clotting Time: 472 seconds

## 2018-03-15 SURGERY — LEFT HEART CATH AND CORONARY ANGIOGRAPHY
Anesthesia: LOCAL

## 2018-03-15 MED ORDER — NITROGLYCERIN 1 MG/10 ML FOR IR/CATH LAB
INTRA_ARTERIAL | Status: AC
  Filled 2018-03-15: qty 10

## 2018-03-15 MED ORDER — ASPIRIN EC 81 MG PO TBEC
81.0000 mg | DELAYED_RELEASE_TABLET | Freq: Every day | ORAL | Status: DC
  Filled 2018-03-15: qty 1

## 2018-03-15 MED ORDER — INSULIN ASPART 100 UNIT/ML ~~LOC~~ SOLN
SUBCUTANEOUS | Status: AC
  Administered 2018-03-15: 5 [IU] via SUBCUTANEOUS
  Filled 2018-03-15: qty 1

## 2018-03-15 MED ORDER — INSULIN ASPART 100 UNIT/ML ~~LOC~~ SOLN
16.0000 [IU] | Freq: Three times a day (TID) | SUBCUTANEOUS | Status: DC
  Administered 2018-03-15 – 2018-03-16 (×2): 16 [IU] via SUBCUTANEOUS

## 2018-03-15 MED ORDER — ACETAMINOPHEN 325 MG PO TABS: 650.0000 mg | ORAL_TABLET | ORAL | Status: DC | PRN

## 2018-03-15 MED ORDER — HYDRALAZINE HCL 20 MG/ML IJ SOLN: 5.0000 mg | INTRAMUSCULAR | Status: AC | PRN

## 2018-03-15 MED ORDER — NITROGLYCERIN 1 MG/10 ML FOR IR/CATH LAB
INTRA_ARTERIAL | Status: DC | PRN
  Administered 2018-03-15: 200 ug via INTRACORONARY
  Administered 2018-03-15: 400 mL via INTRA_ARTERIAL

## 2018-03-15 MED ORDER — VERAPAMIL HCL 2.5 MG/ML IV SOLN
INTRAVENOUS | Status: AC
  Filled 2018-03-15: qty 2

## 2018-03-15 MED ORDER — LABETALOL HCL 100 MG PO TABS
100.0000 mg | ORAL_TABLET | Freq: Two times a day (BID) | ORAL | Status: DC
  Administered 2018-03-15 – 2018-03-16 (×2): 100 mg via ORAL
  Filled 2018-03-15 (×2): qty 1

## 2018-03-15 MED ORDER — ASPIRIN 81 MG PO CHEW: 81.0000 mg | CHEWABLE_TABLET | Freq: Every day | ORAL | Status: DC

## 2018-03-15 MED ORDER — MOMETASONE FURO-FORMOTEROL FUM 200-5 MCG/ACT IN AERO
2.0000 | INHALATION_SPRAY | Freq: Two times a day (BID) | RESPIRATORY_TRACT | Status: DC
Start: 2018-03-15 — End: 2018-03-16
  Administered 2018-03-15 – 2018-03-16 (×2): 2 via RESPIRATORY_TRACT
  Filled 2018-03-15: qty 8.8

## 2018-03-15 MED ORDER — OXYCODONE HCL 5 MG PO TABS
5.0000 mg | ORAL_TABLET | Freq: Two times a day (BID) | ORAL | Status: DC
  Administered 2018-03-16: 10:00:00 5 mg via ORAL
  Filled 2018-03-15 (×2): qty 1

## 2018-03-15 MED ORDER — FAMOTIDINE IN NACL 20-0.9 MG/50ML-% IV SOLN
INTRAVENOUS | Status: AC | PRN
  Administered 2018-03-15: 20 mg via INTRAVENOUS

## 2018-03-15 MED ORDER — SODIUM CHLORIDE 0.9 % IV SOLN: INTRAVENOUS | Status: AC

## 2018-03-15 MED ORDER — LABETALOL HCL 5 MG/ML IV SOLN: 10.0000 mg | INTRAVENOUS | Status: AC | PRN

## 2018-03-15 MED ORDER — INSULIN GLARGINE 100 UNIT/ML ~~LOC~~ SOLN: 12.0000 [IU] | SUBCUTANEOUS | Status: DC

## 2018-03-15 MED ORDER — MIDAZOLAM HCL 2 MG/2ML IJ SOLN
INTRAMUSCULAR | Status: AC
  Filled 2018-03-15: qty 2

## 2018-03-15 MED ORDER — FENTANYL CITRATE (PF) 100 MCG/2ML IJ SOLN
INTRAMUSCULAR | Status: DC | PRN
  Administered 2018-03-15: 25 ug via INTRAVENOUS

## 2018-03-15 MED ORDER — TIROFIBAN HCL IV 12.5 MG/250 ML
INTRAVENOUS | Status: AC
  Filled 2018-03-15: qty 250

## 2018-03-15 MED ORDER — HYDRALAZINE HCL 20 MG/ML IJ SOLN
INTRAMUSCULAR | Status: DC | PRN
  Administered 2018-03-15: 10 mg via INTRAVENOUS

## 2018-03-15 MED ORDER — OXYCODONE-ACETAMINOPHEN 5-325 MG PO TABS
1.0000 | ORAL_TABLET | Freq: Two times a day (BID) | ORAL | Status: DC
  Administered 2018-03-16: 1 via ORAL
  Filled 2018-03-15 (×2): qty 1

## 2018-03-15 MED ORDER — SODIUM CHLORIDE 0.9% FLUSH: 3.0000 mL | Freq: Two times a day (BID) | INTRAVENOUS | Status: DC

## 2018-03-15 MED ORDER — ANGIOPLASTY BOOK
Freq: Once | Status: AC
  Administered 2018-03-15: 21:00:00 1
  Filled 2018-03-15: qty 1

## 2018-03-15 MED ORDER — SODIUM CHLORIDE 0.9% FLUSH: 3.0000 mL | INTRAVENOUS | Status: DC | PRN

## 2018-03-15 MED ORDER — HEPARIN SODIUM (PORCINE) 1000 UNIT/ML IJ SOLN
INTRAMUSCULAR | Status: AC
  Filled 2018-03-15: qty 1

## 2018-03-15 MED ORDER — SODIUM CHLORIDE 0.9 % WEIGHT BASED INFUSION
1.0000 mL/kg/h | INTRAVENOUS | Status: DC
  Administered 2018-03-15: 250 mL via INTRAVENOUS

## 2018-03-15 MED ORDER — INSULIN GLARGINE 100 UNIT/ML ~~LOC~~ SOLN
12.0000 [IU] | Freq: Every day | SUBCUTANEOUS | Status: DC
  Administered 2018-03-16: 12 [IU] via SUBCUTANEOUS
  Filled 2018-03-15: qty 0.12

## 2018-03-15 MED ORDER — POLYETHYLENE GLYCOL 3350 17 G PO PACK: 17.0000 g | PACK | Freq: Every day | ORAL | Status: DC | PRN

## 2018-03-15 MED ORDER — HYDRALAZINE HCL 20 MG/ML IJ SOLN
INTRAMUSCULAR | Status: AC
  Filled 2018-03-15: qty 1

## 2018-03-15 MED ORDER — MELATONIN 3 MG PO TABS
3.0000 mg | ORAL_TABLET | ORAL | Status: AC
  Administered 2018-03-15: 3 mg via ORAL
  Filled 2018-03-15: qty 1

## 2018-03-15 MED ORDER — LIDOCAINE HCL (PF) 1 % IJ SOLN
INTRAMUSCULAR | Status: AC
  Filled 2018-03-15: qty 30

## 2018-03-15 MED ORDER — SODIUM CHLORIDE 0.9 % IV SOLN: 250.0000 mL | INTRAVENOUS | Status: DC | PRN

## 2018-03-15 MED ORDER — MIDAZOLAM HCL 2 MG/2ML IJ SOLN
INTRAMUSCULAR | Status: DC | PRN
  Administered 2018-03-15: 1 mg via INTRAVENOUS

## 2018-03-15 MED ORDER — INSULIN GLARGINE 100 UNIT/ML ~~LOC~~ SOLN
42.0000 [IU] | Freq: Every day | SUBCUTANEOUS | Status: DC
  Filled 2018-03-15: qty 0.42

## 2018-03-15 MED ORDER — FENTANYL CITRATE (PF) 100 MCG/2ML IJ SOLN
INTRAMUSCULAR | Status: AC
  Filled 2018-03-15: qty 2

## 2018-03-15 MED ORDER — SODIUM CHLORIDE 0.9% FLUSH
3.0000 mL | Freq: Two times a day (BID) | INTRAVENOUS | Status: DC
  Administered 2018-03-16: 3 mL via INTRAVENOUS

## 2018-03-15 MED ORDER — AMLODIPINE BESYLATE 5 MG PO TABS
5.0000 mg | ORAL_TABLET | Freq: Every day | ORAL | Status: DC
  Administered 2018-03-16: 5 mg via ORAL
  Filled 2018-03-15: qty 1

## 2018-03-15 MED ORDER — HEPARIN SODIUM (PORCINE) 1000 UNIT/ML IJ SOLN
INTRAMUSCULAR | Status: DC | PRN
  Administered 2018-03-15: 4000 [IU] via INTRAVENOUS
  Administered 2018-03-15: 6000 [IU] via INTRAVENOUS

## 2018-03-15 MED ORDER — HYDRALAZINE HCL 25 MG PO TABS
25.0000 mg | ORAL_TABLET | Freq: Three times a day (TID) | ORAL | Status: DC
  Administered 2018-03-15 – 2018-03-16 (×3): 25 mg via ORAL
  Filled 2018-03-15 (×3): qty 1

## 2018-03-15 MED ORDER — CLOPIDOGREL BISULFATE 75 MG PO TABS
75.0000 mg | ORAL_TABLET | Freq: Every day | ORAL | Status: DC
  Administered 2018-03-16: 08:00:00 75 mg via ORAL
  Filled 2018-03-15: qty 1

## 2018-03-15 MED ORDER — OXYCODONE-ACETAMINOPHEN 10-325 MG PO TABS: 1.0000 | ORAL_TABLET | Freq: Two times a day (BID) | ORAL | Status: DC

## 2018-03-15 MED ORDER — HEPARIN (PORCINE) IN NACL 1000-0.9 UT/500ML-% IV SOLN
INTRAVENOUS | Status: AC
  Filled 2018-03-15: qty 1000

## 2018-03-15 MED ORDER — VERAPAMIL HCL 2.5 MG/ML IV SOLN
INTRAVENOUS | Status: DC | PRN
  Administered 2018-03-15: 10 mL via INTRA_ARTERIAL

## 2018-03-15 MED ORDER — CLOPIDOGREL BISULFATE 300 MG PO TABS
ORAL_TABLET | ORAL | Status: AC
  Filled 2018-03-15: qty 2

## 2018-03-15 MED ORDER — ASPIRIN 81 MG PO CHEW: 81.0000 mg | CHEWABLE_TABLET | ORAL | Status: DC

## 2018-03-15 MED ORDER — ONDANSETRON HCL 4 MG/2ML IJ SOLN
INTRAMUSCULAR | Status: DC | PRN
  Administered 2018-03-15: 4 mg via INTRAVENOUS

## 2018-03-15 MED ORDER — IOHEXOL 350 MG/ML SOLN
INTRAVENOUS | Status: DC | PRN
  Administered 2018-03-15: 105 mL via INTRAVENOUS

## 2018-03-15 MED ORDER — TIROFIBAN (AGGRASTAT) BOLUS VIA INFUSION
INTRAVENOUS | Status: DC | PRN
  Administered 2018-03-15: 1927.5 ug via INTRAVENOUS

## 2018-03-15 MED ORDER — MIDAZOLAM HCL 2 MG/2ML IJ SOLN
INTRAMUSCULAR | Status: DC | PRN
  Administered 2018-03-15: 2 mg via INTRAVENOUS

## 2018-03-15 MED ORDER — NON FORMULARY: 3.0000 mg | Status: DC

## 2018-03-15 MED ORDER — ROSUVASTATIN CALCIUM 10 MG PO TABS
10.0000 mg | ORAL_TABLET | Freq: Every day | ORAL | Status: DC
  Administered 2018-03-15: 10 mg via ORAL
  Filled 2018-03-15: qty 1

## 2018-03-15 MED ORDER — INSULIN LISPRO 100 UNIT/ML ~~LOC~~ SOLN: 16.0000 [IU] | Freq: Three times a day (TID) | SUBCUTANEOUS | Status: DC

## 2018-03-15 MED ORDER — METFORMIN HCL 500 MG PO TABS: 1000.0000 mg | ORAL_TABLET | Freq: Two times a day (BID) | ORAL | Status: DC

## 2018-03-15 MED ORDER — INSULIN ASPART 100 UNIT/ML ~~LOC~~ SOLN
5.0000 [IU] | Freq: Once | SUBCUTANEOUS | Status: AC
  Administered 2018-03-15: 5 [IU] via SUBCUTANEOUS
  Filled 2018-03-15: qty 0.05

## 2018-03-15 MED ORDER — ONDANSETRON HCL 4 MG/2ML IJ SOLN: 4.0000 mg | Freq: Four times a day (QID) | INTRAMUSCULAR | Status: DC | PRN

## 2018-03-15 MED ORDER — FAMOTIDINE IN NACL 20-0.9 MG/50ML-% IV SOLN
INTRAVENOUS | Status: AC
  Filled 2018-03-15: qty 50

## 2018-03-15 MED ORDER — LIDOCAINE HCL (PF) 1 % IJ SOLN
INTRAMUSCULAR | Status: DC | PRN
  Administered 2018-03-15: 2 mL

## 2018-03-15 MED ORDER — CLOPIDOGREL BISULFATE 300 MG PO TABS
ORAL_TABLET | ORAL | Status: DC | PRN
  Administered 2018-03-15: 600 mg via ORAL

## 2018-03-15 MED ORDER — FENTANYL CITRATE (PF) 100 MCG/2ML IJ SOLN
INTRAMUSCULAR | Status: DC | PRN
  Administered 2018-03-15: 50 ug via INTRAVENOUS

## 2018-03-15 MED ORDER — ONDANSETRON HCL 4 MG/2ML IJ SOLN
INTRAMUSCULAR | Status: AC
  Filled 2018-03-15: qty 2

## 2018-03-15 MED ORDER — HEPARIN (PORCINE) IN NACL 2-0.9 UNITS/ML
INTRAMUSCULAR | Status: AC | PRN
  Administered 2018-03-15: 1000 mL

## 2018-03-15 MED ORDER — SODIUM CHLORIDE 0.9 % WEIGHT BASED INFUSION
3.0000 mL/kg/h | INTRAVENOUS | Status: DC
  Administered 2018-03-15: 3 mL/kg/h via INTRAVENOUS

## 2018-03-15 SURGICAL SUPPLY — 19 items
BALLN SAPPHIRE 2.5X15 (BALLOONS) ×2
BALLOON SAPPHIRE 2.5X15 (BALLOONS) ×1 IMPLANT
CATH 5FR JL3.5 JR4 ANG PIG MP (CATHETERS) ×2 IMPLANT
CATH VISTA GUIDE 6FR XBRCA (CATHETERS) ×2 IMPLANT
DEVICE RAD COMP TR BAND LRG (VASCULAR PRODUCTS) ×2 IMPLANT
GLIDESHEATH SLEND SS 6F .021 (SHEATH) ×2 IMPLANT
GUIDEWIRE INQWIRE 1.5J.035X260 (WIRE) ×1 IMPLANT
INQWIRE 1.5J .035X260CM (WIRE) ×2
KIT ENCORE 26 ADVANTAGE (KITS) ×2 IMPLANT
KIT HEART LEFT (KITS) ×2 IMPLANT
KIT HEMO VALVE WATCHDOG (MISCELLANEOUS) ×2 IMPLANT
NEEDLE PERC 21GX4CM (NEEDLE) ×2 IMPLANT
PACK CARDIAC CATHETERIZATION (CUSTOM PROCEDURE TRAY) ×2 IMPLANT
STENT SYNERGY DES 3.5X38 (Permanent Stent) ×2 IMPLANT
STENT SYNERGY DES 4X12 (Permanent Stent) ×2 IMPLANT
STENT SYNERGY DES 4X28 (Permanent Stent) ×2 IMPLANT
TRANSDUCER W/STOPCOCK (MISCELLANEOUS) ×2 IMPLANT
TUBING CIL FLEX 10 FLL-RA (TUBING) ×2 IMPLANT
WIRE ASAHI FIELDER XT 190CM (WIRE) ×2 IMPLANT

## 2018-03-15 NOTE — Interval H&P Note (Signed)
Cath Lab Visit (complete for each Cath Lab visit)  Clinical Evaluation Leading to the Procedure:   ACS: No.  Non-ACS:    Anginal Classification: CCS III  Anti-ischemic medical therapy: Minimal Therapy (1 class of medications)  Non-Invasive Test Results: Intermediate-risk stress test findings: cardiac mortality 1-3%/year  Prior CABG: No previous CABG   Abnormal CT FFR in all three vessels distally.  Appears that the RCA may be the most proximal stenosis.    History and Physical Interval Note:  03/15/2018 10:43 AM  [name] [name]  has presented today for surgery, with the diagnosis of cp  The various methods of treatment have been discussed with the patient and family. After consideration of risks, benefits and other options for treatment, the patient has consented to  Procedure(s): LEFT HEART CATH AND CORONARY ANGIOGRAPHY (N/A) as a surgical intervention .  The patient's history has been reviewed, patient examined, no change in status, stable for surgery.  I have reviewed the patient's chart and labs.  Questions were answered to the patient's satisfaction.     Larae Grooms

## 2018-03-15 NOTE — Progress Notes (Signed)
TR BAND REMOVAL  LOCATION:    right radial  DEFLATED PER PROTOCOL:    Yes.    TIME BAND OFF / DRESSING APPLIED:    1730   SITE UPON ARRIVAL:    Level 0  SITE AFTER BAND REMOVAL:    Level 1, bruise proximal to site   CIRCULATION SENSATION AND MOVEMENT:    Within Normal Limits   Yes.    COMMENTS:   Rechecked with no change in assessment

## 2018-03-16 ENCOUNTER — Telehealth: Payer: Self-pay | Admitting: Cardiology

## 2018-03-16 DIAGNOSIS — E782 Mixed hyperlipidemia: Secondary | ICD-10-CM

## 2018-03-16 DIAGNOSIS — Z955 Presence of coronary angioplasty implant and graft: Secondary | ICD-10-CM | POA: Diagnosis not present

## 2018-03-16 DIAGNOSIS — E1159 Type 2 diabetes mellitus with other circulatory complications: Secondary | ICD-10-CM

## 2018-03-16 DIAGNOSIS — E785 Hyperlipidemia, unspecified: Secondary | ICD-10-CM | POA: Diagnosis not present

## 2018-03-16 DIAGNOSIS — I209 Angina pectoris, unspecified: Secondary | ICD-10-CM | POA: Diagnosis not present

## 2018-03-16 DIAGNOSIS — I10 Essential (primary) hypertension: Secondary | ICD-10-CM

## 2018-03-16 DIAGNOSIS — I25119 Atherosclerotic heart disease of native coronary artery with unspecified angina pectoris: Secondary | ICD-10-CM | POA: Diagnosis not present

## 2018-03-16 DIAGNOSIS — I252 Old myocardial infarction: Secondary | ICD-10-CM | POA: Diagnosis not present

## 2018-03-16 LAB — BASIC METABOLIC PANEL
ANION GAP: 7 (ref 5–15)
BUN: 12 mg/dL (ref 6–20)
CHLORIDE: 104 mmol/L (ref 101–111)
CO2: 28 mmol/L (ref 22–32)
Calcium: 8.9 mg/dL (ref 8.9–10.3)
Creatinine, Ser: 0.76 mg/dL (ref 0.44–1.00)
GFR calc Af Amer: >60 mL/min (ref >60)
GFR calc non Af Amer: >60 mL/min (ref >60)
Glucose, Bld: 75 mg/dL (ref 65–99)
POTASSIUM: 3.7 mmol/L (ref 3.5–5.1)
SODIUM: 139 mmol/L (ref 135–145)

## 2018-03-16 LAB — CBC
HEMATOCRIT: 38 % (ref 36.0–46.0)
HEMOGLOBIN: 13 g/dL (ref 12.0–15.0)
MCH: 29.5 pg (ref 26.0–34.0)
MCHC: 34.2 g/dL (ref 30.0–36.0)
MCV: 86.4 fL (ref 78.0–100.0)
Platelets: 216 10*3/uL (ref 150–400)
RBC: 4.4 MIL/uL (ref 3.87–5.11)
RDW: 13.1 % (ref 11.5–15.5)
WBC: 8.2 10*3/uL (ref 4.0–10.5)

## 2018-03-16 LAB — GLUCOSE, CAPILLARY
Glucose-Capillary: 304 mg/dL — ABNORMAL HIGH (ref 65–99)
Glucose-Capillary: 200 mg/dL — ABNORMAL HIGH (ref 65–99)

## 2018-03-16 MED ORDER — NITROGLYCERIN 0.4 MG SL SUBL: 0.4000 mg | SUBLINGUAL_TABLET | SUBLINGUAL | 12 refills | Status: DC | PRN

## 2018-03-16 MED ORDER — CLOPIDOGREL BISULFATE 75 MG PO TABS: 75.0000 mg | ORAL_TABLET | Freq: Every day | ORAL | 3 refills | Status: DC

## 2018-03-16 MED ORDER — PANTOPRAZOLE SODIUM 40 MG PO TBEC: 40.0000 mg | DELAYED_RELEASE_TABLET | Freq: Every day | ORAL | 1 refills | Status: DC | PRN

## 2018-03-16 MED FILL — Heparin Sod (Porcine)-NaCl IV Soln 1000 Unit/500ML-0.9%: INTRAVENOUS | Qty: 1000 | Status: AC

## 2018-03-16 NOTE — Progress Notes (Signed)
CARDIAC REHAB PHASE I   PRE:  Rate/Rhythm: 72 SR  BP:  Supine:   Sitting: 218/63 auto,  160/70 manual  Standing:    SaO2: 97%RA  MODE:  Ambulation: 800 ft   POST:  Rate/Rhythm: 85 SR  BP:  Supine:   Sitting: 170/86 manual  Standing:    SaO2: 97%RA 0810-0907 Pt walked 800 ft on RA with steady gait and no CP. Tolerated well and stated back felt better after walk. BP taken manually as pt stated auto cuff hurts her arm. Reviewed importance of plavix with stent. Reviewed NTG use, ex ed, diabetic and heart healthy diet, CRP 2 and risk factors. Will refer to [facility] CRP 2 but pt does not want to attend. Likes to go to [employer]. Put on stent video for her to view. To chair with call bell.   Graylon Good, RN BSN  03/16/2018 9:02 AM

## 2018-03-16 NOTE — Progress Notes (Signed)
Discharge instructions reviewed with patient.  These included the following:  Prescriptions, when to call the MD, medication instructions, post-angio-cath instructions, activity instructions, dietary needs, etc.  Comprehension of information validated via "teach back" process.

## 2018-03-16 NOTE — Telephone Encounter (Signed)
New Message   [facility] appointment made on 03/24/18 at 9:40 with Dr. Harl Bowie in [place] per Daune Perch

## 2018-03-16 NOTE — Discharge Instructions (Signed)

## 2018-03-16 NOTE — Discharge Summary (Signed)
Discharge Summary    Patient ID: [name] [name],  MRN: [id number], DOB/AGE: 1940/12/25 77 y.o.  Admit date: 03/15/2018 Discharge date: 03/16/2018  Primary Care Provider: Arsenio Katz Primary Cardiologist: Carlyle Dolly, MD  Discharge Diagnoses    Active Problems:   Hypertension   Hyperlipidemia   DM type 2 causing vascular disease ([place])   Angina pectoris ([place])   Allergies Allergies  Allergen Reactions  . Clonidine Derivatives Other (See Comments)    dizziness  . Lisinopril Other (See Comments)    Tongue swelling  . Losartan Other (See Comments)    TONGUE SWELLING  . Codeine Nausea And Vomiting  . Hydrocodone Nausea And Vomiting    Diagnostic Studies/Procedures    CORONARY STENT INTERVENTION  LEFT HEART CATH AND CORONARY ANGIOGRAPHY 03/15/18  Conclusion    Dist RCA lesion is 75% stenosed.  Mid RCA lesion is 99% stenosed.  Prox RCA to Mid RCA lesion is 60% stenosed.  Drug-eluting stent was successfully placed: STENT SYNERGY DES 3.5X38., 4.0 x 28 mm and 4.0 x 12 mm.  Post intervention, there is a 0% residual stenosis.  Ost 1st Mrg lesion is 50% stenosed.  1st Mrg lesion is 75% stenosed. This was a small vessel.  Mid Cx lesion is 60% stenosed. THis was a fairly small vessel.  Mid LAD lesion is 60% stenosed. THis was not significant by CT FFR.  Prox LAD to Mid LAD lesion is 25% stenosed.  2nd Diag lesion is 75% stenosed.  The left ventricular systolic function is normal.  LV end diastolic pressure is normal.  There is no aortic valve stenosis.   Recommend uninterrupted dual antiplatelet therapy with Aspirin 81mg  daily and Clopidogrel 75mg  daily for a minimum of 12 months (ACS - Class I recommendation).   Continue aggressive secondary prevention.   Medical management for now of the left sided disease.  RCA was clearly the most significant lesion and the largest ischemic territory.   _____________  Diagnostic Diagram         Post-Intervention Diagram          History of present illness   [name] [name] is a 77 y.o. female with past medical history of CAD s/p PTCA to distal LAD in 2001. 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 She has been working out at [employer]. Walking track, 2nd time around gets chest painand has to stop. She described pressure like entire chest, 4/10 in severity. +SOB. She stops and rests, and then pain resolves in just a few seconds. She has been unable to run on treadmill.   02/2018 coronary CTA suggests severe RCA disease, abnormal FFR in distal LAD and LCX as well. She was referred to the hospital for cardiac catheterization.    Hospital Course     Consultants: None  [name] [name] underwent left heart cath on 03/15/18 that revealed 75% distal RCA and 99% mid RCA stenosis and DES was placed to long section of the RCA. She also has residual 75% 1st marginal, 60% mid Circ, 60% mid LAD and 75% 2nd diagonal (see above cath report).   Recommend uninterrupted dual antiplatelet therapy with Aspirin 81mg  daily and Clopidogrel 75mg  daily for a minimum of 12 months (ACS - Class I recommendation).  Continue aggressive secondary prevention.  Medical management for now of the left sided disease.  RCA was clearly the most significant lesion and the largest ischemic territory.   She did well overnight and  has been up walking with cardiac rehab. No further chest pain or shortness of breath.   Continue blood pressure management with amlodipine 5 mg, chlorthalidone 12.5 mg daily, hydralazine 25 mg 1.5 tabs TID, labetalol 100 mg bid. BP was elevated off of her chlorthalidone for the cath. Also was on lower dose of hydralazine. Will resume all meds at discharge as per her prior home regimen. Renal function remained stable after cath with SCr 0.76.   Last LDL was 30 in 06/18/2016. Will need updated lipid panel.  _____________  Discharge  Vitals Blood pressure (!) 158/64, pulse 61, temperature 98.2 F (36.8 C), temperature source Oral, resp. rate (!) 22, height 5\' 5"  (1.651 m), weight 171 lb 8.3 oz (77.8 kg), SpO2 96 %.  Filed Weights   03/15/18 0844 03/16/18 0309  Weight: 170 lb (77.1 kg) 171 lb 8.3 oz (77.8 kg)   Physical Exam  Constitutional: She is oriented to person, place, and time. She appears well-developed and well-nourished. No distress.  HENT:  Head: Normocephalic and atraumatic.  Neck: Normal range of motion. Neck supple.  Cardiovascular: Normal rate, regular rhythm and intact distal pulses. Exam reveals no gallop and no friction rub.  No murmur heard. Pulmonary/Chest: Effort normal and breath sounds normal. No respiratory distress. She has no wheezes. She has no rales.  Abdominal: Soft. Bowel sounds are normal.  Musculoskeletal: Normal range of motion. She exhibits no edema.  Neurological: She is alert and oriented to person, place, and time.  Skin: Skin is warm and dry.  Psychiatric: She has a normal mood and affect. Her behavior is normal. Thought content normal.  Vitals reviewed.  Right radial cath site with bruising and tenderness but soft without hematoma.   Labs & Radiologic Studies    CBC Recent Labs    03/16/18 0229  WBC 8.2  HGB 13.0  HCT 38.0  MCV 86.4  PLT 831   Basic Metabolic Panel Recent Labs    03/16/18 0229  NA 139  K 3.7  CL 104  CO2 28  GLUCOSE 75  BUN 12  CREATININE 0.76  CALCIUM 8.9   Liver Function Tests No results for input(s): AST, ALT, ALKPHOS, BILITOT, PROT, ALBUMIN in the last 72 hours. No results for input(s): LIPASE, AMYLASE in the last 72 hours. Cardiac Enzymes No results for input(s): CKTOTAL, CKMB, CKMBINDEX, TROPONINI in the last 72 hours. BNP Invalid input(s): POCBNP D-Dimer No results for input(s): DDIMER in the last 72 hours. Hemoglobin A1C No results for input(s): HGBA1C in the last 72 hours. Fasting Lipid Panel No results for input(s): CHOL,  HDL, LDLCALC, TRIG, CHOLHDL, LDLDIRECT in the last 72 hours. Thyroid Function Tests No results for input(s): TSH, T4TOTAL, T3FREE, THYROIDAB in the last 72 hours.  Invalid input(s): FREET3 _____________  Ct Coronary Morph W/cta Cor W/score W/ca W/cm &/or Wo/cm  Addendum Date: 02/28/2018   ADDENDUM REPORT: 02/28/2018 13:52 CLINICAL DATA:  Chest pain EXAM: Cardiac CTA MEDICATIONS: Sub lingual nitro.  4mg  x 2 and lopressor 5mg  TECHNIQUE: The patient was scanned on a Siemens 517 slice scanner. Gantry rotation speed was 250 msecs. Collimation was 0.6 mm. A 100 kV prospective scan was triggered in the ascending thoracic aorta at 35-75% of the R-R interval. Average HR during the scan was 60 bpm. The 3D data set was interpreted on a dedicated work station using MPR, MIP and VRT modes. A total of 80cc of contrast was used. FINDINGS: Non-cardiac: See separate report from [facility]. Calcium Score: 402 Agatston units  Coronary Arteries: Right dominant with no anomalies LM: No significant disease. LAD system: Calcified plaque proximal LAD mild stenosis. Mixed plaque mid LAD possible moderate stenosis. Circumflex system: Calcified plaque proximal and mid LCx, mild stenosis. RCA system: Mixed plaque in the mid and distal RCA as well as in the PDA. Possible moderate mid RCA stenosis. IMPRESSION: 1. Coronary artery calcium score 402 Agatston units, placing the patient in the 82nd percentile for age and gender. This suggests high risk for future cardiac events. 2. Concern for moderate mid RCA and moderate mid LAD stenosis. Will send FFR to confirm. Dalton Mclean Electronically Signed   By: Loralie Champagne M.D.   On: 02/28/2018 13:52   Result Date: 02/28/2018 EXAM: OVER-READ INTERPRETATION  CT CHEST The following report is an over-read performed by radiologist Dr. Rolm Baptise of [facility], [place] on 02/28/2018. This over-read does not include interpretation of cardiac or coronary anatomy or pathology. The  coronary CTA interpretation by the cardiologist is attached. COMPARISON:  None. FINDINGS: Vascular: Heart is normal size. Visualized aorta is normal caliber. Scattered aortic calcifications at the aortic root and in the visualized descending thoracic aorta. Mediastinum/Nodes: No adenopathy in the lower mediastinum or hila. Lungs/Pleura: 4 mm nodule in the anterior right middle lobe. No effusions. Upper Abdomen: Imaging into the upper abdomen shows no acute findings. Musculoskeletal: Chest wall soft tissues are unremarkable. No acute bony abnormality. IMPRESSION: 4 mm anterior right middle lobe nodule. This appears dense and may reflect a granuloma. No follow-up needed if patient is low-risk. Non-contrast chest CT can be considered in 12 months if patient is high-risk. This recommendation follows the consensus statement: Guidelines for Management of Incidental Pulmonary Nodules Detected on CT Images: From the Fleischner Society 2017; Radiology 2017; 284:228-243. Electronically Signed: By: Rolm Baptise M.D. On: 02/28/2018 12:21   Ct Coronary Fractional Flow Reserve Data Prep  Result Date: 03/16/2018 CLINICAL DATA:  Chest pain EXAM: CT FFR MEDICATIONS: No additional medications. TECHNIQUE: The coronary CTA was sent for CT FFR. FINDINGS: FFR < 0.5 distal RCA suggesting severe mid RCA stenosis. FFR 0.83 mid LAD suggesting non-hemodynamically significant mid LAD stenosis. FFR 0.66 distal LAD suggesting significant distal LAD stenosis (versus cumulative effect of diffuse disease in LAD). FFR 0.67 distal LCx suggesting significant stenosis mid to distal LCx (versus cumulative effect of diffuse disease in LCx). IMPRESSION: 1.  Suspect severe mid RCA stenosis. 2. FFR significantly low in distal LAD and distal LCx. This could suggest a single hemodynamically significant stenosis versus the cumulative effect of a long area of disease. Suggest cardiac cath. Dalton Mclean Electronically Signed   By: Loralie Champagne M.D.   On:  03/02/2018 08:35   Ct Coronary Fractional Flow Reserve Fluid Analysis  Result Date: 03/16/2018 CLINICAL DATA:  Chest pain EXAM: CT FFR MEDICATIONS: No additional medications. TECHNIQUE: The coronary CTA was sent for CT FFR. FINDINGS: FFR < 0.5 distal RCA suggesting severe mid RCA stenosis. FFR 0.83 mid LAD suggesting non-hemodynamically significant mid LAD stenosis. FFR 0.66 distal LAD suggesting significant distal LAD stenosis (versus cumulative effect of diffuse disease in LAD). FFR 0.67 distal LCx suggesting significant stenosis mid to distal LCx (versus cumulative effect of diffuse disease in LCx). IMPRESSION: 1.  Suspect severe mid RCA stenosis. 2. FFR significantly low in distal LAD and distal LCx. This could suggest a single hemodynamically significant stenosis versus the cumulative effect of a long area of disease. Suggest cardiac cath. Dalton [employer] Electronically Signed   By: Katherene Ponto.D.  On: 03/02/2018 08:35   Disposition   Pt is being discharged home today in good condition.  Follow-up Plans & Appointments    Discharge Instructions    AMB Referral to Cardiac Rehabilitation - Phase II   Complete by:  As directed    Diagnosis:  Coronary Stents   Amb Referral to Cardiac Rehabilitation   Complete by:  As directed    Diagnosis:  Coronary Stents   Diet - low sodium heart healthy   Complete by:  As directed    Discharge instructions   Complete by:  As directed    PLEASE REMEMBER TO BRING ALL OF YOUR MEDICATIONS TO EACH OF YOUR FOLLOW-UP OFFICE VISITS.  PLEASE ATTEND ALL SCHEDULED FOLLOW-UP APPOINTMENTS.   Activity: Increase activity slowly as tolerated. You may shower, but no soaking baths (or swimming) for 1 week. No driving for 24 hours. No lifting over 5 lbs for 1 week. No sexual activity for 1 week.   You May Return to Work: in 1 week (if applicable)  Wound Care: You may wash cath site gently with soap and water. Keep cath site clean and dry. If you notice pain,  swelling, bleeding or pus at your cath site, please call [phone].   Increase activity slowly   Complete by:  As directed       Discharge Medications   Allergies as of 03/16/2018      Reactions   Clonidine Derivatives Other (See Comments)   dizziness   Lisinopril Other (See Comments)   Tongue swelling   Losartan Other (See Comments)   TONGUE SWELLING   Codeine Nausea And Vomiting   Hydrocodone Nausea And Vomiting      Medication List    STOP taking these medications   omeprazole 20 MG capsule Commonly known as:  PRILOSEC     TAKE these medications   amLODipine 5 MG tablet Commonly known as:  NORVASC TAKE (1) TABLET BY MOUTH ONCE DAILY.   aspirin 81 MG tablet Take 81 mg by mouth at bedtime.   betamethasone dipropionate 0.05 % cream Commonly known as:  DIPROLENE Apply 1 application topically 2 (two) times daily.   CALCIUM 1200 PO Take 1 capsule by mouth daily.   chlorthalidone 25 MG tablet Commonly known as:  HYGROTON Take 0.5 tablets (12.5 mg total) by mouth 2 (two) times a week. On Monday and Fridays   clopidogrel 75 MG tablet Commonly known as:  PLAVIX Take 1 tablet (75 mg total) by mouth daily with breakfast. Start taking on:  03/17/2018   hydrALAZINE 25 MG tablet Commonly known as:  APRESOLINE TAKE 1&1/2 TABLETS 3 TIMES DAILY   insulin glargine 100 UNIT/ML injection Commonly known as:  LANTUS Inject 12-42 Units into the skin See admin instructions. Inject 12 units SQ every morning and inject 42 units SQ every evening around 1600   insulin lispro 100 UNIT/ML injection Commonly known as:  HUMALOG Inject 16 Units into the skin 3 (three) times daily with meals.   labetalol 100 MG tablet Commonly known as:  NORMODYNE TAKE (1) TABLET TWICE DAILY.   LUMIGAN 0.01 % Soln Generic drug:  bimatoprost Place 1 drop into both eyes at bedtime.   metFORMIN 1000 MG tablet Commonly known as:  GLUCOPHAGE Take 1 tablet (1,000 mg total) by mouth 2 (two) times  daily with a meal.   nitroGLYCERIN 0.4 MG SL tablet Commonly known as:  NITROSTAT Place 1 tablet (0.4 mg total) under the tongue every 5 (five) minutes as needed for  chest pain.   oxyCODONE-acetaminophen 10-325 MG tablet Commonly known as:  PERCOCET Take 1 tablet by mouth 2 (two) times daily.   pantoprazole 40 MG tablet Commonly known as:  PROTONIX Take 1 tablet (40 mg total) by mouth daily as needed (indigestion/heartburn).   polyethylene glycol packet Commonly known as:  MIRALAX / GLYCOLAX Take 17 g by mouth daily as needed for moderate constipation.   rosuvastatin 10 MG tablet Commonly known as:  CRESTOR Take 10 mg by mouth at bedtime.   SYMBICORT 160-4.5 MCG/ACT inhaler Generic drug:  budesonide-formoterol Inhale 2 puffs into the lungs 2 (two) times daily.        Acute coronary syndrome (MI, NSTEMI, STEMI, etc) this admission?: Yes.     AHA/ACC Clinical Performance & Quality Measures: 1. Aspirin prescribed? - Yes 2. ADP Receptor Inhibitor (Plavix/Clopidogrel, Brilinta/Ticagrelor or Effient/Prasugrel) prescribed (includes medically managed patients)? - Yes 3. Beta Blocker prescribed? - Yes 4. High Intensity Statin (Lipitor 40-80mg  or Crestor 20-40mg ) prescribed? - No - continuing on crestor 10 mg, follow up in office.  5. EF assessed during THIS hospitalization? - Yes 6. For EF <40%, was ACEI/ARB prescribed? - Not Applicable (EF >/= 56%) 7. For EF <40%, Aldosterone Antagonist (Spironolactone or Eplerenone) prescribed? - Not Applicable (EF >/= 38%) 8. Cardiac Rehab Phase II ordered (Included Medically managed Patients)? - Yes     Outstanding Labs/Studies   Needs follow up lipid panel  Duration of Discharge Encounter   Greater than 30 minutes including physician time.  Signed, Luna Fuse, NP 03/16/2018, 11:07 AM

## 2018-03-16 NOTE — Progress Notes (Signed)
Patient discharged to private residence accompanied by three sisters and a friend via private vehicle.  Escorted to exit via wheelchair by nurse tech.

## 2018-03-20 NOTE — Telephone Encounter (Signed)
**Note De-Identified [name] [name] Obfuscation** The pt is following up in our [place] office with Dr Harl Bowie. Will forward message to that office.

## 2018-03-21 ENCOUNTER — Telehealth: Payer: Self-pay | Admitting: Cardiology

## 2018-03-21 NOTE — Telephone Encounter (Signed)
Appointment scheduled for 6/19. Advised patient if pain was that severe she needed to go back to [facility]. Patient refused.

## 2018-03-21 NOTE — Telephone Encounter (Signed)
Patient called stating bruising at cath site - notes tenderness and some swelling in that area. States that she is in a lot of pain . Please call [phone].

## 2018-03-21 NOTE — Telephone Encounter (Signed)
Patient contacted regarding discharge from [facility] on 03/16/2018.    Patient understands to follow up with Dr. Harl Bowie on 03/24/2018 at 9:40 in [place] office.   Patient understands discharge instructions?  YES  Patient understands medications and regiment?  YES  Patient understands to bring all medications to this visit?  YES   Patient does note bruising at cath site - notes tenderness and some swelling in that area.  No streaking.  She is aware to keep elevated.  Provider will check site this Friday at her OV.   She verbalized understanding.

## 2018-03-22 ENCOUNTER — Ambulatory Visit (INDEPENDENT_AMBULATORY_CARE_PROVIDER_SITE_OTHER): Payer: Medicare Other | Admitting: Cardiology

## 2018-03-22 ENCOUNTER — Encounter: Payer: Self-pay | Admitting: Cardiology

## 2018-03-22 ENCOUNTER — Telehealth: Payer: Self-pay | Admitting: Cardiology

## 2018-03-22 VITALS — BP 196/68 | HR 66 | Ht 65.0 in | Wt 168.4 lb

## 2018-03-22 DIAGNOSIS — M79601 Pain in right arm: Secondary | ICD-10-CM

## 2018-03-22 DIAGNOSIS — I2510 Atherosclerotic heart disease of native coronary artery without angina pectoris: Secondary | ICD-10-CM

## 2018-03-22 NOTE — Progress Notes (Signed)
Clinical Summary [name] [name] is a 76 y.o.female seen today for a focused follow on recent stent placement and right wrist pain after her cath.    1. CAD - prior PTCA to distal LAD in 2001. 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  - echo 06/2012 LVEF 60-65% - DSE 06/2012 with reported ST elevation in inferior leads, negative echo images for ischemia.   - reported exertional chest pain last visit.  - 02/2018 coronary CTA suggests severe RCA disease, abnormal FFR in distal LAD and LCX as well.  - 03/2018 cath as reported below. Received DES x3  to RCA. 75% OM1 small vessel managed medically   - pain at cath site. Right radial access cath.  - no recent chest pain, no SOB or DOE - had percocet at home, taking 1 tabled every 12 hours.       Past Medical History:  Diagnosis Date  . Anxiety   . Arthritis    "neck" (03/15/2018)  . CAD (coronary artery disease)    Status post POBA distal LAD, status post Cardiolite Myoview 2008 negative for ischemia  . COPD (chronic obstructive pulmonary disease) ([place])   . Depression   . GERD (gastroesophageal reflux disease)   . Hypercholesterolemia   . Hypertension   . Peripheral vascular disease ([place])     status post iliac stent July 2001   . Type II diabetes mellitus ([facility])      Allergies  Allergen Reactions  . Clonidine Derivatives Other (See Comments)    dizziness  . Lisinopril Other (See Comments)    Tongue swelling  . Losartan Other (See Comments)    TONGUE SWELLING  . Codeine Nausea And Vomiting  . Hydrocodone Nausea And Vomiting     Current Outpatient Medications  Medication Sig Dispense Refill  . amLODipine (NORVASC) 5 MG tablet TAKE (1) TABLET BY MOUTH ONCE DAILY. 30 tablet 3  . aspirin 81 MG tablet Take 81 mg by mouth at bedtime.     . betamethasone dipropionate (DIPROLENE) 0.05 % cream Apply 1 application topically 2 (two) times daily.    . bimatoprost  (LUMIGAN) 0.01 % SOLN Place 1 drop into both eyes at bedtime.    . Calcium Carbonate-Vit D-Min (CALCIUM 1200 PO) Take 1 capsule by mouth daily.    . chlorthalidone (HYGROTON) 25 MG tablet Take 0.5 tablets (12.5 mg total) by mouth 2 (two) times a week. On Monday and Fridays 9 tablet 3  . clopidogrel (PLAVIX) 75 MG tablet Take 1 tablet (75 mg total) by mouth daily with breakfast. 90 tablet 3  . hydrALAZINE (APRESOLINE) 25 MG tablet TAKE 1&1/2 TABLETS 3 TIMES DAILY 135 tablet 1  . insulin glargine (LANTUS) 100 UNIT/ML injection Inject 12-42 Units into the skin See admin instructions. Inject 12 units SQ every morning and inject 42 units SQ every evening around 1600    . insulin lispro (HUMALOG) 100 UNIT/ML injection Inject 16 Units into the skin 3 (three) times daily with meals.     [name] labetalol (NORMODYNE) 100 MG tablet TAKE (1) TABLET TWICE DAILY. 60 tablet 6  . metFORMIN (GLUCOPHAGE) 1000 MG tablet Take 1 tablet (1,000 mg total) by mouth 2 (two) times daily with a meal.    . nitroGLYCERIN (NITROSTAT) 0.4 MG SL tablet Place 1 tablet (0.4 mg total) under the tongue every 5 (five) minutes as needed for chest pain. 25 tablet 12  . oxyCODONE-acetaminophen (PERCOCET) 10-325 MG tablet Take  1 tablet by mouth 2 (two) times daily.    . pantoprazole (PROTONIX) 40 MG tablet Take 1 tablet (40 mg total) by mouth daily as needed (indigestion/heartburn). 30 tablet 1  . polyethylene glycol (MIRALAX / GLYCOLAX) packet Take 17 g by mouth daily as needed for moderate constipation.     . rosuvastatin (CRESTOR) 10 MG tablet Take 10 mg by mouth at bedtime.     . SYMBICORT 160-4.5 MCG/ACT inhaler Inhale 2 puffs into the lungs 2 (two) times daily.      No current facility-administered medications for this visit.      Past Surgical History:  Procedure Laterality Date  . ANTERIOR CERVICAL DECOMP/DISCECTOMY FUSION  2001  . BACK SURGERY    . CATARACT EXTRACTION, BILATERAL Bilateral   . CHOLECYSTECTOMY OPEN    . CORONARY  ANGIOPLASTY     POBA distal LAD,  . CORONARY STENT INTERVENTION N/A 03/15/2018   Procedure: CORONARY STENT INTERVENTION;  Surgeon: Jettie Booze, MD;  Location: [facility];  Service: Cardiovascular;  Laterality: N/A;  . DILATION AND CURETTAGE OF UTERUS  1960s  . EYE SURGERY Bilateral    "lasers"  . LEFT HEART CATH AND CORONARY ANGIOGRAPHY N/A 03/15/2018   Procedure: LEFT HEART CATH AND CORONARY ANGIOGRAPHY;  Surgeon: Jettie Booze, MD;  Location: [facility];  Service: Cardiovascular;  Laterality: N/A;  . PERIPHERAL VASCULAR INTERVENTION Right 04/2000    iliac stent   . TUBAL LIGATION       Allergies  Allergen Reactions  . Clonidine Derivatives Other (See Comments)    dizziness  . Lisinopril Other (See Comments)    Tongue swelling  . Losartan Other (See Comments)    TONGUE SWELLING  . Codeine Nausea And Vomiting  . Hydrocodone Nausea And Vomiting      Family History  Problem Relation Age of Onset  . Heart failure Father   . Heart failure Mother   . Diabetes Mother   . COPD Brother      Social History [name] [name] reports that she quit smoking about 22 years ago. Her smoking use included cigarettes. She started smoking about 56 years ago. She has a 20.00 pack-year smoking history. She has never used smokeless tobacco. [name] [name] reports that she does not drink alcohol.   Review of Systems CONSTITUTIONAL: No weight loss, fever, chills, weakness or fatigue.  HEENT: Eyes: No visual loss, blurred vision, double vision or yellow sclerae.No hearing loss, sneezing, congestion, runny nose or sore throat.  SKIN: No rash or itching.  CARDIOVASCULAR: per hpi RESPIRATORY: No shortness of breath, cough or sputum.  GASTROINTESTINAL: No anorexia, nausea, vomiting or diarrhea. No abdominal pain or blood.  GENITOURINARY: No burning on urination, no polyuria NEUROLOGICAL: No headache, dizziness, syncope, paralysis, ataxia, numbness or tingling in the  extremities. No change in bowel or bladder control.  MUSCULOSKELETAL: right wrist pain LYMPHATICS: No enlarged nodes. No history of splenectomy.  PSYCHIATRIC: No history of depression or anxiety.  ENDOCRINOLOGIC: No reports of sweating, cold or heat intolerance. No polyuria or polydipsia.  [name]   Physical Examination Vitals:   03/22/18 1600 03/22/18 1602  BP: (!) 177/75 (!) 196/68  Pulse: 61 66   Vitals:   03/22/18 1559  Weight: 168 lb 6.4 oz (76.4 kg)  Height: '5\' 5"'  (1.651 m)    Gen: resting comfortably, no acute distress HEENT: no scleral icterus, pupils equal round and reactive, no palptable cervical adenopathy,  CV: RRR, no m/g/, no jvd Resp: Clear  to auscultation bilaterally GI: abdomen is soft, non-tender, non-distended, normal bowel sounds, no hepatosplenomegaly MSK: right wrist swelling, bruising. Tender to palpation. Normal radial pulse Skin: warm, no rash Neuro:  no focal deficits Psych: appropriate affect   Diagnostic Studies 11/1999 Cath HEMODYNAMIC DATA: Aortic pressure was initially extremely high at 215/92. She  was  given intravenous enalaprilat, intravenous labetalol, and intravenous  nitroglycerin, ultimately bringing her systolic blood pressure down to less  than  160. Left ventricular pressure was 180/17, with a corresponding aortic  pressure of  180/80. There was no aortic valve gradient.  LEFT VENTRICULOGRAM: Wall motion was normal. Ejection fraction was estimated  t  65%. No mitral regurgitation.  ABDOMINAL AORTOGRAM: This revealed patent renal arteries and abdominal aorta.  The  left common iliac artery had a 75% stenosis. The right external iliac artery  had a  40% stenosis.  CORONARY ARTERIOGRAPHY (Right dominant):  1. The left main was normal.  2. The left anterior descending artery had a 25% stenosis in the proximal to  mid  vessel. The distal LAD had a 50% stenosis just after the bifurcation of the  large second  diagonal [name] [name]. Further down the distal LAD is a focal 80%  stenosis.  3. The left circumflex had a 20% stenosis in the mid vessel. It gave rise to a  small OM-1, a normal sized OM-2 which had a 25% stenosis, and a normal sized  OM-3 which had a 20% stenosis.  4. The right coronary artery had a diffuse 40% stenosis extending from the mid  o  distal vessel. There was a large posterior descending artery which had a  25%,  followed by a 40% stenosis. There were two small posterolateral branches.  IMPRESSIONS:  1. Normal left ventricular systolic function.  2. Peripheral vascular disease, as described.  3. One-vessel coronary artery disease with significant stenosis in the distal  left  anterior descending artery. This appears to correspond with her EKG changes  and  Cardiolite scan. She has moderate, but non-flow limiting disease in the  right coronary and left circumflex.  PLAN: Percutaneous intervention of the distal LAD, see below.  PTCA PROCEDURAL NOTE: Following the completion of the diagnostic  catheterization,  we opted to proceed with percutaneous intervention. The preexisting 6-French  sheath in the right femoral artery was exchanged over a wire for a 7-French  sheath.  Integrilin and heparin were administered per protocol. We used a 7-French JL-4  guiding catheter and a BMW wire. The reference vessel was approximately 2 mm  in  diameter. We utilized a 2.0 x 20 mm CrossSail balloon, which was inflated  initially to 12 atm x 2 minutes and then 14 atm x 3 minutes. Final  angiographic  images revealed significant improvement in the lumen with 25% residual stenosis  and  a possible very small non-flow limiting dissection. There was TIMI-3 flow into  the  distal vessel.  COMPLICATIONS: None.  RESULTS: Successful percutaneous transluminal coronary angioplasty of the  distal  left anterior descending artery, reducing an 80% stenosis to 25%  residual with  TIMI-3 flow.  PLAN: Integrilin will be continued for 20 hours. The patient needs aggressive  blood pressure control and risk factor reduction.  In regards to her peripheral vascular disease, she will be referred for further  evaluation and possible intervention.  03/2012 Echo LVEF 60-65%, no significant abnormalities   02/19/16 Clinic EKG (performed and reviewed in clinic): sinus bradycardia, no ischemic changes   02/2018 coronary CTA/FFR  FINDINGS: FFR < 0.5 distal RCA suggesting severe mid RCA stenosis.  FFR 0.83 mid LAD suggesting non-hemodynamically significant mid LAD stenosis.  FFR 0.66 distal LAD suggesting significant distal LAD stenosis (versus cumulative effect of diffuse disease in LAD).  FFR 0.67 distal LCx suggesting significant stenosis mid to distal LCx (versus cumulative effect of diffuse disease in LCx).  IMPRESSION: 1. Suspect severe mid RCA stenosis.  2. FFR significantly low in distal LAD and distal LCx. This could suggest a single hemodynamically significant stenosis versus the cumulative effect of a long area of disease.  Suggest cardiac cath.    03/2018 cath  Dist RCA lesion is 75% stenosed.  Mid RCA lesion is 99% stenosed.  Prox RCA to Mid RCA lesion is 60% stenosed.  Drug-eluting stent was successfully placed: STENT SYNERGY DES 3.5X38., 4.0 x 28 mm and 4.0 x 12 mm.  Post intervention, there is a 0% residual stenosis.  Ost 1st Mrg lesion is 50% stenosed.  1st Mrg lesion is 75% stenosed. This was a small vessel.  Mid Cx lesion is 60% stenosed. THis was a fairly small vessel.  Mid LAD lesion is 60% stenosed. THis was not significant by CT FFR.  Prox LAD to Mid LAD lesion is 25% stenosed.  2nd Diag lesion is 75% stenosed.  The left ventricular systolic function is normal.  LV end diastolic pressure is normal.  There is no aortic valve stenosis.   Recommend uninterrupted dual antiplatelet therapy with  Aspirin 38m daily and Clopidogrel 756mdaily for a minimum of 12 months (ACS - Class I recommendation).   Assessment and Plan  1., CAD - doing well from cardiac standpoint after recent stent - continue current meds  2. Right wrist hematoma/Right arm pain - hematoma noted after recent right radial cath - continue tylenol for pain control, she already has prn opiates at home she takes for her other chronic pains - obtain right upper extremity arterial ultrasound to further evaluate.   3. HTN - elevaeted in clinically as they typically are, home bp's have been at goal. History of white coat HTN     JoArnoldo LenisM.D.

## 2018-03-22 NOTE — Telephone Encounter (Signed)
Pre-cert Verification for the following procedure   US VENOUS IMAG BI/LT/RT    (STAT)  DR. BRANCH Scheduled for 03-23-18 at [facility]

## 2018-03-22 NOTE — Patient Instructions (Signed)
Medication Instructions:  Your physician recommends that you continue on your current medications as directed. Please refer to the Current Medication list given to you today.  Labwork: NONE  Testing/Procedures: Your physician has requested that you have a lower or upper extremity arterial duplex. This test is an ultrasound of the arteries in the legs or arms. It looks at arterial blood flow in the legs and arms. Allow one hour for Lower and Upper Arterial scans. There are no restrictions or special instructions  Follow-Up: Your physician recommends that you schedule a follow-up appointment in: 3 MONTHS   Any Other Special Instructions Will Be Listed Below (If Applicable).  If you need a refill on your cardiac medications before your next appointment, please call your pharmacy.

## 2018-03-23 ENCOUNTER — Ambulatory Visit (HOSPITAL_COMMUNITY)
Admission: RE | Admit: 2018-03-23 | Discharge: 2018-03-23 | Disposition: A | Discharge status: Home / Self Care | Payer: Medicare Other | Source: Ambulatory Visit | Attending: Cardiology | Admitting: Cardiology

## 2018-03-23 DIAGNOSIS — M799 Soft tissue disorder, unspecified: Secondary | ICD-10-CM | POA: Diagnosis not present

## 2018-03-23 DIAGNOSIS — M79601 Pain in right arm: Secondary | ICD-10-CM | POA: Diagnosis present

## 2018-03-24 ENCOUNTER — Telehealth: Payer: Self-pay | Admitting: *Deleted

## 2018-03-24 ENCOUNTER — Ambulatory Visit: Payer: Medicare Other | Admitting: Cardiology

## 2018-03-24 NOTE — Telephone Encounter (Signed)
Notes recorded by Laurine Blazer, LPN on 0/98/1191 at 4:78 PM EDT Patient notified. Copy to pmd. Follow up scheduled for 04/17/2018 with Dr. Harl Bowie. Nurse visit scheduled for 04/05/2018 at 9:00. ------  Notes recorded by [name], MD on 03/23/2018 at 10:57 AM EDT Ultrasound shows a hematoma consistent with what I though on exam. This will take some time to heal on its own. Continue the tylenol we discussed as well as cold compresses. There is no other intervention needed at this time. (Of note should have been an arterial ultrasound of RUE, however the venous study was sufficient to show [place] what we needed). Can she come in 2 weeks for nursing visit (July 2 or 3rd) while I am here to take a quick look at it.   Zandra Abts MD

## 2018-04-05 ENCOUNTER — Ambulatory Visit (INDEPENDENT_AMBULATORY_CARE_PROVIDER_SITE_OTHER): Payer: Medicare Other | Admitting: *Deleted

## 2018-04-05 DIAGNOSIS — Z955 Presence of coronary angioplasty implant and graft: Secondary | ICD-10-CM

## 2018-04-05 NOTE — Progress Notes (Signed)
Patient in office this morning for Dr. Harl Bowie to check radial cath site.

## 2018-04-05 NOTE — Progress Notes (Signed)
Patients right arm examined. Right wrist hematoma improving, much smaller in size and soft to palaption. Less pain for her. US showed only a hematoma. Should resolve in next few weeks.    Carlyle Dolly MD

## 2018-04-17 ENCOUNTER — Ambulatory Visit: Payer: Medicare Other | Admitting: Cardiology

## 2018-04-17 ENCOUNTER — Other Ambulatory Visit: Payer: Self-pay | Admitting: Cardiology

## 2018-05-04 ENCOUNTER — Other Ambulatory Visit: Payer: Self-pay | Admitting: Cardiology

## 2018-05-22 ENCOUNTER — Telehealth: Payer: Self-pay | Admitting: Cardiology

## 2018-05-22 MED ORDER — CLOPIDOGREL BISULFATE 75 MG PO TABS: 75.0000 mg | ORAL_TABLET | Freq: Every day | ORAL | 0 refills | Status: DC

## 2018-05-22 MED ORDER — PANTOPRAZOLE SODIUM 40 MG PO TBEC: 40.0000 mg | DELAYED_RELEASE_TABLET | Freq: Every day | ORAL | 0 refills | Status: DC | PRN

## 2018-05-22 NOTE — Telephone Encounter (Signed)
° °  1. Which medications need to be refilled? (please list name of each medication and dose if known)  clopidogrel (PLAVIX) 75 MG  And Protonix 40 mg   2. Which pharmacy/location (including street and city if local pharmacy) is medication to be sent to? [place]  3. Do they need a 30 day or 90 day supply? 90 days

## 2018-05-22 NOTE — Telephone Encounter (Signed)
RX sent

## 2018-05-24 ENCOUNTER — Other Ambulatory Visit: Payer: Self-pay | Admitting: Cardiology

## 2018-06-14 ENCOUNTER — Telehealth: Payer: Self-pay | Admitting: Cardiology

## 2018-06-14 DIAGNOSIS — I739 Peripheral vascular disease, unspecified: Secondary | ICD-10-CM

## 2018-06-14 DIAGNOSIS — M79606 Pain in leg, unspecified: Secondary | ICD-10-CM

## 2018-06-14 DIAGNOSIS — I10 Essential (primary) hypertension: Secondary | ICD-10-CM

## 2018-06-14 NOTE — Telephone Encounter (Signed)
Cramps in left leg radiating to fingers? Is that correct or radiating to her toes? Are the cramps in her left leg at rest or with walking?     [name] Abts MD

## 2018-06-14 NOTE — Telephone Encounter (Signed)
Patient called stating that she has been having cramps in her left leg radiating into her fingers.  Saw PCP 06-13-18 and was advised that she needs to go to [facility]. Patient wants to let Dr. Harl Bowie know and advise.

## 2018-06-14 NOTE — Telephone Encounter (Signed)
Spoke with patient and she confirmed that she has cramps while resting in her left leg with knots and also cramping in fingers in toes.

## 2018-06-15 ENCOUNTER — Encounter: Payer: Self-pay | Admitting: *Deleted

## 2018-06-15 NOTE — Telephone Encounter (Signed)
Symptoms arent' typical of poor circulation, but since she has had blockages in her heart she is at risk of blockages in the legs. Did her pcp order ABIs already? It is reasonable to do. I would order a BMET/Mg for her if her pcp has not checked recently.    Zandra Abts MD

## 2018-06-15 NOTE — Telephone Encounter (Signed)
Patient informed and said that no test were ordered by her PCP. Lab orders faxed to [facility]. PCP office note requested.

## 2018-06-16 ENCOUNTER — Encounter: Payer: Self-pay | Admitting: *Deleted

## 2018-06-16 ENCOUNTER — Other Ambulatory Visit: Payer: Self-pay

## 2018-06-16 ENCOUNTER — Telehealth: Payer: Self-pay | Admitting: Cardiology

## 2018-06-16 DIAGNOSIS — I83893 Varicose veins of bilateral lower extremities with other complications: Secondary | ICD-10-CM

## 2018-06-16 NOTE — Telephone Encounter (Signed)
Lab work requested.

## 2018-06-16 NOTE — Telephone Encounter (Signed)
Patient called requesting recent labs that were done at [facility]   06-15-2018.

## 2018-06-16 NOTE — Telephone Encounter (Signed)
Patient informed that results have been sent to her provider for review and once he reviews them that she would be contacted with his response.

## 2018-06-19 ENCOUNTER — Telehealth: Payer: Self-pay | Admitting: Cardiology

## 2018-06-19 NOTE — Telephone Encounter (Signed)
Patient notified.  Will send to [facility] for scheduling.

## 2018-06-19 NOTE — Telephone Encounter (Signed)
Ok to order the leg venous US study as  well   Zandra Abts MD

## 2018-06-19 NOTE — Telephone Encounter (Signed)
Patient informed that lab results are on Dr. Harl Bowie desk top awaiting his review.    Also, in regards to the ultrasound.  Patient is asking if Dr. Harl Bowie will order LE VENOUS REFLUX test to be done here.  Is scheduled to have this done at [facility], but not until November.

## 2018-06-19 NOTE — Telephone Encounter (Signed)
Mrs. Shvartsman called regarding her recent labs. She also wants to see if Dr. Harl Bowie will order an ultrasound on her.

## 2018-06-20 ENCOUNTER — Telehealth: Payer: Self-pay | Admitting: *Deleted

## 2018-06-20 NOTE — Telephone Encounter (Signed)
Pt aware - routed to pcp  

## 2018-06-20 NOTE — Telephone Encounter (Signed)
-----   Message from [name], MD sent at 06/19/2018 10:33 AM EDT ----- Labs look good   Zandra Abts MD

## 2018-06-22 ENCOUNTER — Other Ambulatory Visit: Payer: Self-pay | Admitting: Cardiology

## 2018-06-22 DIAGNOSIS — I739 Peripheral vascular disease, unspecified: Secondary | ICD-10-CM

## 2018-06-25 ENCOUNTER — Other Ambulatory Visit: Payer: Self-pay | Admitting: Cardiology

## 2018-06-27 ENCOUNTER — Encounter: Payer: Self-pay | Admitting: Cardiology

## 2018-06-27 ENCOUNTER — Ambulatory Visit (INDEPENDENT_AMBULATORY_CARE_PROVIDER_SITE_OTHER): Payer: Medicare Other | Admitting: Cardiology

## 2018-06-27 VITALS — BP 168/62 | HR 67 | Ht 65.0 in | Wt 171.0 lb

## 2018-06-27 DIAGNOSIS — I2510 Atherosclerotic heart disease of native coronary artery without angina pectoris: Secondary | ICD-10-CM | POA: Diagnosis not present

## 2018-06-27 DIAGNOSIS — E782 Mixed hyperlipidemia: Secondary | ICD-10-CM

## 2018-06-27 DIAGNOSIS — I10 Essential (primary) hypertension: Secondary | ICD-10-CM | POA: Diagnosis not present

## 2018-06-27 DIAGNOSIS — M79604 Pain in right leg: Secondary | ICD-10-CM

## 2018-06-27 DIAGNOSIS — M79605 Pain in left leg: Secondary | ICD-10-CM

## 2018-06-27 MED ORDER — LABETALOL HCL 200 MG PO TABS: 200.0000 mg | ORAL_TABLET | Freq: Two times a day (BID) | ORAL | 1 refills | Status: DC

## 2018-06-27 NOTE — Progress Notes (Signed)
Clinical Summary [name] [name] is a 77 y.o.female  1. CAD - prior PTCA to distal LAD in 2001. 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  - echo 06/2012 LVEF 60-65% - DSE 06/2012 with reported ST elevation in inferior leads, negative echo images for ischemia.   - reported exertional chest pain last visit.  - 02/2018 coronary CTA suggests severe RCA disease, abnormal FFR in distal LAD and LCX as well. - 03/2018 cath as reported below. Received DES x3  to RCA. 75% OM1 small vessel managed medically     - no recent chest pain. Mild SOB at times.  - compliant with meds. PCP holding crestor x 1 week for leg cramps, symptoms are improved. Has f/u 10/1 with pcp.     2. HTN - history of difficult to treat HTN, primarily due to reported medication side effects to several agents.  - history of angioedema on ACE-I and most recently on ARB.  - she reports amlodopine caused leg swelling(she had been on 49m). Clonidine caused dizziness.  - we had started chlrothalidone 588mdaily. She had some muscle cramping, and the dose was decreased to 2530maily, then 39m68md and then 12.5mg 5m.  - dizziness after starting hydralazine, pins and needles feelings in ears with laying down. We have tried continuing this medication. Same side effect reported on nitrates, discontinued.  - she reports alopecia possibly due to aldactone  - Borderline low HRs,initially hesistant to try beta blockers has been started on labetalol - home bp's 160s/70s.     3. PAD - prior iliac stent in July 2001 - [place] 9/[place] Right ABI 0.99 Left ABI 0.99, with patent left iliac stent.  -recent leg cramping pain, she has ABIs already ordered.    4. Hyperlipidemia -pcp holding statin at this time due to leg cramps Past Medical History:  Diagnosis Date  . Anxiety   . Arthritis    "neck" (03/15/2018)  . CAD (coronary artery disease)    Status post POBA  distal LAD, status post Cardiolite Myoview 2008 negative for ischemia  . COPD (chronic obstructive pulmonary disease) (HCC) [place] Depression   . GERD (gastroesophageal reflux disease)   . Hypercholesterolemia   . Hypertension   . Peripheral vascular disease (HCC) [place] status post iliac stent July 2001   . Type II diabetes mellitus (HCC)      Allergies  Allergen Reactions  . Clonidine Derivatives Other (See Comments)    dizziness  . Lisinopril Other (See Comments)    Tongue swelling  . Losartan Other (See Comments)    TONGUE SWELLING  . Codeine Nausea And Vomiting  . Hydrocodone Nausea And Vomiting     Current Outpatient Medications  Medication Sig Dispense Refill  . amLODipine (NORVASC) 5 MG tablet TAKE (1) TABLET BY MOUTH ONCE DAILY. 30 tablet 3  . aspirin 81 MG tablet Take 81 mg by mouth at bedtime.     . betamethasone dipropionate (DIPROLENE) 0.05 % cream Apply 1 application topically 2 (two) times daily.    . bimatoprost (LUMIGAN) 0.01 % SOLN Place 1 drop into both eyes at bedtime.    . Calcium Carbonate-Vit D-Min (CALCIUM 1200 PO) Take 1 capsule by mouth daily.    . chlorthalidone (HYGROTON) 25 MG tablet TAKE 1/2 TABLET (12.5MG) 2 TIMES A WEEK, ON [place] CHANGE. 9 tablet 2  . clopidogrel (PLAVIX) 75 MG tablet Take 1 tablet (75  mg total) by mouth daily with breakfast. 90 tablet 0  . hydrALAZINE (APRESOLINE) 25 MG tablet TAKE 1&1/2 TABLETS 3 TIMES DAILY 135 tablet 2  . insulin glargine (LANTUS) 100 UNIT/ML injection Inject 12-42 Units into the skin See admin instructions. Inject 12 units SQ every morning and inject 42 units SQ every evening around 1600    . insulin lispro (HUMALOG) 100 UNIT/ML injection Inject 16 Units into the skin 3 (three) times daily with meals.     [name] labetalol (NORMODYNE) 100 MG tablet TAKE (1) TABLET TWICE DAILY. 60 tablet 6  . labetalol (NORMODYNE) 100 MG tablet TAKE (1) TABLET TWICE DAILY. 60 tablet 3  . metFORMIN (GLUCOPHAGE)  1000 MG tablet Take 1 tablet (1,000 mg total) by mouth 2 (two) times daily with a meal.    . nitroGLYCERIN (NITROSTAT) 0.4 MG SL tablet Place 1 tablet (0.4 mg total) under the tongue every 5 (five) minutes as needed for chest pain. 25 tablet 12  . oxyCODONE-acetaminophen (PERCOCET) 10-325 MG tablet Take 1 tablet by mouth 2 (two) times daily.    . pantoprazole (PROTONIX) 40 MG tablet Take 1 tablet (40 mg total) by mouth daily as needed (indigestion/heartburn). 90 tablet 0  . polyethylene glycol (MIRALAX / GLYCOLAX) packet Take 17 g by mouth daily as needed for moderate constipation.     . rosuvastatin (CRESTOR) 10 MG tablet Take 10 mg by mouth at bedtime.     . SYMBICORT 160-4.5 MCG/ACT inhaler Inhale 2 puffs into the lungs 2 (two) times daily.      No current facility-administered medications for this visit.      Past Surgical History:  Procedure Laterality Date  . ANTERIOR CERVICAL DECOMP/DISCECTOMY FUSION  2001  . BACK SURGERY    . CATARACT EXTRACTION, BILATERAL Bilateral   . CHOLECYSTECTOMY OPEN    . CORONARY ANGIOPLASTY     POBA distal LAD,  . CORONARY STENT INTERVENTION N/A 03/15/2018   Procedure: CORONARY STENT INTERVENTION;  Surgeon: [name] Booze, MD;  Location: [facility];  Service: Cardiovascular;  Laterality: N/A;  . DILATION AND CURETTAGE OF UTERUS  1960s  . EYE SURGERY Bilateral    "lasers"  . LEFT HEART CATH AND CORONARY ANGIOGRAPHY N/A 03/15/2018   Procedure: LEFT HEART CATH AND CORONARY ANGIOGRAPHY;  Surgeon: [name] Booze, MD;  Location: [facility];  Service: Cardiovascular;  Laterality: N/A;  . PERIPHERAL VASCULAR INTERVENTION Right 04/2000    iliac stent   . TUBAL LIGATION       Allergies  Allergen Reactions  . Clonidine Derivatives Other (See Comments)    dizziness  . Lisinopril Other (See Comments)    Tongue swelling  . Losartan Other (See Comments)    TONGUE SWELLING  . Codeine Nausea And Vomiting  . Hydrocodone Nausea And  Vomiting      Family History  Problem Relation Age of Onset  . Heart failure Father   . Heart failure Mother   . Diabetes Mother   . COPD Brother      Social History [name] reports that she quit smoking about 22 years ago. Her smoking use included cigarettes. She started smoking about 56 years ago. She has a 20.00 pack-year smoking history. She has never used smokeless tobacco. Ms. Trager reports that she does not drink alcohol.   Review of Systems CONSTITUTIONAL: No weight loss, fever, chills, weakness or fatigue.  HEENT: Eyes: No visual loss, blurred vision, double vision or yellow sclerae.No hearing loss, sneezing, congestion,  runny nose or sore throat.  SKIN: No rash or itching.  CARDIOVASCULAR: per hpi RESPIRATORY: No shortness of breath, cough or sputum.  GASTROINTESTINAL: No anorexia, nausea, vomiting or diarrhea. No abdominal pain or blood.  GENITOURINARY: No burning on urination, no polyuria NEUROLOGICAL: No headache, dizziness, syncope, paralysis, ataxia, numbness or tingling in the extremities. No change in bowel or bladder control.  MUSCULOSKELETAL: Leg pains LYMPHATICS: No enlarged nodes. No history of splenectomy.  PSYCHIATRIC: No history of depression or anxiety.  ENDOCRINOLOGIC: No reports of sweating, cold or heat intolerance. No polyuria or polydipsia.  [name]   Physical Examination Vitals:   06/27/18 1309  BP: (!) 168/62  Pulse: 67  SpO2: 98%   Vitals:   06/27/18 1309  Weight: 171 lb (77.6 kg)  Height: 5' 5" (1.651 m)    Gen: resting comfortably, no acute distress HEENT: no scleral icterus, pupils equal round and reactive, no palptable cervical adenopathy,  CV: RRR, 2/6 systolic murmur rusb, no jvd Resp: Clear to auscultation bilaterally GI: abdomen is soft, non-tender, non-distended, normal bowel sounds, no hepatosplenomegaly MSK: extremities are warm, no edema.  Skin: warm, no rash Neuro:  no focal deficits Psych: appropriate  affect   Diagnostic Studies 11/1999 Cath HEMODYNAMIC DATA: Aortic pressure was initially extremely high at 215/92. She  was  given intravenous enalaprilat, intravenous labetalol, and intravenous  nitroglycerin, ultimately bringing her systolic blood pressure down to less  than  160. Left ventricular pressure was 180/17, with a corresponding aortic  pressure of  180/80. There was no aortic valve gradient.  LEFT VENTRICULOGRAM: Wall motion was normal. Ejection fraction was estimated  t  65%. No mitral regurgitation.  ABDOMINAL AORTOGRAM: This revealed patent renal arteries and abdominal aorta.  The  left common iliac artery had a 75% stenosis. The right external iliac artery  had a  40% stenosis.  CORONARY ARTERIOGRAPHY (Right dominant):  1. The left main was normal.  2. The left anterior descending artery had a 25% stenosis in the proximal to  mid  vessel. The distal LAD had a 50% stenosis just after the bifurcation of the  large second diagonal branch. Further down the distal LAD is a focal 80%  stenosis.  3. The left circumflex had a 20% stenosis in the mid vessel. It gave rise to a  small OM-1, a normal sized OM-2 which had a 25% stenosis, and a normal sized  OM-3 which had a 20% stenosis.  4. The right coronary artery had a diffuse 40% stenosis extending from the mid  o  distal vessel. There was a large posterior descending artery which had a  25%,  followed by a 40% stenosis. There were two small posterolateral branches.  IMPRESSIONS:  1. Normal left ventricular systolic function.  2. Peripheral vascular disease, as described.  3. One-vessel coronary artery disease with significant stenosis in the distal  left  anterior descending artery. This appears to correspond with her EKG changes  and  Cardiolite scan. She has moderate, but non-flow limiting disease in the  right coronary and left circumflex.  PLAN: Percutaneous  intervention of the distal LAD, see below.  PTCA PROCEDURAL NOTE: Following the completion of the diagnostic  catheterization,  we opted to proceed with percutaneous intervention. The preexisting 6-French  sheath in the right femoral artery was exchanged over a wire for a 7-French  sheath.  Integrilin and heparin were administered per protocol. We used a 7-French JL-4  guiding catheter and a BMW wire. The reference vessel was  approximately 2 mm  in  diameter. We utilized a 2.0 x 20 mm CrossSail balloon, which was inflated  initially to 12 atm x 2 minutes and then 14 atm x 3 minutes. Final  angiographic  images revealed significant improvement in the lumen with 25% residual stenosis  and  a possible very small non-flow limiting dissection. There was TIMI-3 flow into  the  distal vessel.  COMPLICATIONS: None.  RESULTS: Successful percutaneous transluminal coronary angioplasty of the  distal  left anterior descending artery, reducing an 80% stenosis to 25% residual with  TIMI-3 flow.  PLAN: Integrilin will be continued for 20 hours. The patient needs aggressive  blood pressure control and risk factor reduction.  In regards to her peripheral vascular disease, she will be referred for further  evaluation and possible intervention.  03/2012 Echo LVEF 60-65%, no significant abnormalities   02/19/16 Clinic EKG (performed and reviewed in clinic): sinus bradycardia, no ischemic changes   02/2018 coronary CTA/FFR FINDINGS: FFR < 0.5 distal RCA suggesting severe mid RCA stenosis.  FFR 0.83 mid LAD suggesting non-hemodynamically significant mid LAD stenosis.  FFR 0.66 distal LAD suggesting significant distal LAD stenosis (versus cumulative effect of diffuse disease in LAD).  FFR 0.67 distal LCx suggesting significant stenosis mid to distal LCx (versus cumulative effect of diffuse disease in LCx).  IMPRESSION: 1. Suspect severe mid RCA  stenosis.  2. FFR significantly low in distal LAD and distal LCx. This could suggest a single hemodynamically significant stenosis versus the cumulative effect of a long area of disease.  Suggest cardiac cath.    03/2018 cath  Dist RCA lesion is 75% stenosed.  Mid RCA lesion is 99% stenosed.  Prox RCA to Mid RCA lesion is 60% stenosed.  Drug-eluting stent was successfully placed: STENT SYNERGY DES 3.5X38., 4.0 x 28 mm and 4.0 x 12 mm.  Post intervention, there is a 0% residual stenosis.  Ost 1st Mrg lesion is 50% stenosed.  1st Mrg lesion is 75% stenosed. This was a small vessel.  Mid Cx lesion is 60% stenosed. THis was a fairly small vessel.  Mid LAD lesion is 60% stenosed. THis was not significant by CT FFR.  Prox LAD to Mid LAD lesion is 25% stenosed.  2nd Diag lesion is 75% stenosed.  The left ventricular systolic function is normal.  LV end diastolic pressure is normal.  There is no aortic valve stenosis.  Recommend uninterrupted dual antiplatelet therapy with Aspirin 53m daily and Clopidogrel 760mdailyfor a minimum of 12 months (ACS - Class I recommendation).     Assessment and Plan   1., CAD - no symptoms, continue current meds  2. HTN - home and clinic numbers above goal, increase labetalol to 20052mid.  - management complicated by side effects to multiple agents as described above.   3. PAD - leg pains, follow ABIs  4. Leg swelling - f/u venous reflux study - continue chlorthalidone, only able to tolerate twice a week dosing due to side effects  F/u 4 months  JonArnoldo Lenis.D.

## 2018-06-27 NOTE — Patient Instructions (Signed)
Your physician wants you to follow-up in: [place] has recommended you make the following change in your medication:   INCREASE LABETALOL 200 MG TWICE DAILY  Thank you for choosing [facility]!!

## 2018-06-28 ENCOUNTER — Telehealth: Payer: Self-pay | Admitting: Cardiology

## 2018-06-28 NOTE — Telephone Encounter (Signed)
[name] [name] called stating that her PCP Dr. Manuella Ghazi set up an appointment with Dr. Oneida Alar at [facility]. She is scheduled to  have LE Venous studies on 07/05/2018. She is wanting to know if she still needs to come to our office on 07-06-18 for the LE Arteriel.

## 2018-06-30 NOTE — Telephone Encounter (Signed)
Patient advised to see the Vascular doctor and find out from him if he feels she still needs to have the LE arterial doppler at our office. Patient verbalized understanding of plan.

## 2018-07-05 ENCOUNTER — Encounter

## 2018-07-05 ENCOUNTER — Ambulatory Visit (HOSPITAL_COMMUNITY)
Admission: RE | Admit: 2018-07-05 | Discharge: 2018-07-05 | Disposition: A | Discharge status: Home / Self Care | Payer: Medicare Other | Source: Ambulatory Visit | Attending: Surgery | Admitting: Surgery

## 2018-07-05 ENCOUNTER — Ambulatory Visit (INDEPENDENT_AMBULATORY_CARE_PROVIDER_SITE_OTHER): Payer: Medicare Other | Admitting: Vascular Surgery

## 2018-07-05 ENCOUNTER — Encounter: Payer: Self-pay | Admitting: Vascular Surgery

## 2018-07-05 ENCOUNTER — Other Ambulatory Visit: Payer: Self-pay

## 2018-07-05 VITALS — BP 140/84 | HR 60 | Temp 98.4°F | Resp 18 | Ht 65.0 in | Wt 169.0 lb

## 2018-07-05 DIAGNOSIS — R600 Localized edema: Secondary | ICD-10-CM | POA: Diagnosis not present

## 2018-07-05 DIAGNOSIS — I83893 Varicose veins of bilateral lower extremities with other complications: Secondary | ICD-10-CM | POA: Insufficient documentation

## 2018-07-05 DIAGNOSIS — I83813 Varicose veins of bilateral lower extremities with pain: Secondary | ICD-10-CM

## 2018-07-05 DIAGNOSIS — R609 Edema, unspecified: Secondary | ICD-10-CM | POA: Diagnosis present

## 2018-07-05 NOTE — Progress Notes (Signed)
Referring Physician: Dr Manuella Ghazi  Patient name: [name] [name] MRN: [id number] DOB: 13-Sep-1941 Sex: female  REASON FOR CONSULT: Symptomatic varicose veins with pain  HPI: [name] [name] is a 77 y.o. female with several month history of pain in both legs primarily cramping at nighttime.  Left leg is worse than the right.  Also feels some "knots" in her left ankle area.  She denies claudication symptoms.  She has a family history of varicose veins in her mother.  She denies prior history of DVT.  She has recently begun wearing knee-high compression stockings which she states helped some with the cramps.  She occasionally gets some swelling around her ankles and feet and the stockings also help with this.  She has been wearing knee-high compression stockings for several months.  He also states the pain has improved somewhat after recently stopping her statin medication.  She has an additional medication that they are considering stopping to see if it improves her leg pain symptoms.  She thinks this is her chlorthalidone.  Other medical problems include coronary artery disease, COPD, elevated cholesterol, hypertension, diabetes.  These of all been stable.  She denies shortness of breath.  She denies chest pain.  She apparently did have some type of right leg stent placed several years ago.  Her chart suggests this was an iliac stent placed in 2001.  We do not have records regarding this.  Past Medical History:  Diagnosis Date  . Anxiety   . Arthritis    "neck" (03/15/2018)  . CAD (coronary artery disease)    Status post POBA distal LAD, status post Cardiolite Myoview 2008 negative for ischemia  . COPD (chronic obstructive pulmonary disease) ([place])   . Depression   . GERD (gastroesophageal reflux disease)   . Hypercholesterolemia   . Hypertension   . Peripheral vascular disease ([place])     status post iliac stent July 2001   . Type II diabetes mellitus ([place])    Past Surgical History:  Procedure  Laterality Date  . ANTERIOR CERVICAL DECOMP/DISCECTOMY FUSION  2001  . BACK SURGERY    . CATARACT EXTRACTION, BILATERAL Bilateral   . CHOLECYSTECTOMY OPEN    . CORONARY ANGIOPLASTY     POBA distal LAD,  . CORONARY STENT INTERVENTION N/A 03/15/2018   Procedure: CORONARY STENT INTERVENTION;  Surgeon: Jettie Booze, MD;  Location: [facility];  Service: Cardiovascular;  Laterality: N/A;  . DILATION AND CURETTAGE OF UTERUS  1960s  . EYE SURGERY Bilateral    "lasers"  . LEFT HEART CATH AND CORONARY ANGIOGRAPHY N/A 03/15/2018   Procedure: LEFT HEART CATH AND CORONARY ANGIOGRAPHY;  Surgeon: Jettie Booze, MD;  Location: [facility];  Service: Cardiovascular;  Laterality: N/A;  . PERIPHERAL VASCULAR INTERVENTION Right 04/2000    iliac stent   . TUBAL LIGATION      Family History  Problem Relation Age of Onset  . Heart failure Father   . Heart failure Mother   . Diabetes Mother   . COPD Brother     SOCIAL HISTORY: Social History   Socioeconomic History  . Marital status: Widowed    Spouse name: Not on file  . Number of children: Not on file  . Years of education: Not on file  . Highest education level: Not on file  Occupational History  . Not on file  Social Needs  . Financial resource strain: Not on file  . Food insecurity:    Worry:  Not on file    Inability: Not on file  . Transportation needs:    Medical: Not on file    Non-medical: Not on file  Tobacco Use  . Smoking status: Former Smoker    Packs/day: 0.50    Years: 40.00    Pack years: 20.00    Types: Cigarettes    Start date: 11/30/1961    Last attempt to quit: 10/05/1995    Years since quitting: 22.7  . Smokeless tobacco: Never Used  Substance and Sexual Activity  . Alcohol use: Never    Alcohol/week: 0.0 standard drinks    Frequency: Never  . Drug use: Never  . Sexual activity: Not Currently  Lifestyle  . Physical activity:    Days per week: Not on file    Minutes per session:  Not on file  . Stress: Not on file  Relationships  . Social connections:    Talks on phone: Not on file    Gets together: Not on file    Attends religious service: Not on file    Active member of club or organization: Not on file    Attends meetings of clubs or organizations: Not on file    Relationship status: Not on file  . Intimate partner violence:    Fear of current or ex partner: Not on file    Emotionally abused: Not on file    Physically abused: Not on file    Forced sexual activity: Not on file  Other Topics Concern  . Not on file  Social History Narrative  . Not on file    Allergies  Allergen Reactions  . Clonidine Derivatives Other (See Comments)    dizziness  . Crestor [Rosuvastatin]     Muscle cramps  . Lisinopril Other (See Comments)    Tongue swelling  . Losartan Other (See Comments)    TONGUE SWELLING  . Codeine Nausea And Vomiting  . Hydrocodone Nausea And Vomiting    Current Outpatient Medications  Medication Sig Dispense Refill  . amLODipine (NORVASC) 5 MG tablet TAKE (1) TABLET BY MOUTH ONCE DAILY. 30 tablet 3  . aspirin 81 MG tablet Take 81 mg by mouth at bedtime.     . betamethasone dipropionate (DIPROLENE) 0.05 % cream Apply 1 application topically 2 (two) times daily.    . bimatoprost (LUMIGAN) 0.01 % SOLN Place 1 drop into both eyes at bedtime.    . Calcium Carbonate-Vit D-Min (CALCIUM 1200 PO) Take 1 capsule by mouth daily.    . chlorthalidone (HYGROTON) 25 MG tablet TAKE 1/2 TABLET (12.5MG ) 2 TIMES A WEEK, ON [place]....Marland KitchenDIRECTION CHANGE. 9 tablet 2  . clopidogrel (PLAVIX) 75 MG tablet Take 1 tablet (75 mg total) by mouth daily with breakfast. 90 tablet 0  . hydrALAZINE (APRESOLINE) 25 MG tablet TAKE 1&1/2 TABLETS 3 TIMES DAILY 135 tablet 2  . insulin glargine (LANTUS) 100 UNIT/ML injection Inject 12-42 Units into the skin See admin instructions. Inject 12 units SQ every morning and inject 42 units SQ every evening around 1600    .  insulin lispro (HUMALOG) 100 UNIT/ML injection Inject 16 Units into the skin 3 (three) times daily with meals.     [name] labetalol (NORMODYNE) 200 MG tablet Take 1 tablet (200 mg total) by mouth 2 (two) times daily. 180 tablet 1  . metFORMIN (GLUCOPHAGE) 1000 MG tablet Take 1 tablet (1,000 mg total) by mouth 2 (two) times daily with a meal.    . nitroGLYCERIN (NITROSTAT) 0.4 MG  SL tablet Place 1 tablet (0.4 mg total) under the tongue every 5 (five) minutes as needed for chest pain. 25 tablet 12  . oxyCODONE-acetaminophen (PERCOCET) 10-325 MG tablet Take 1 tablet by mouth 2 (two) times daily.    . pantoprazole (PROTONIX) 40 MG tablet Take 1 tablet (40 mg total) by mouth daily as needed (indigestion/heartburn). 90 tablet 0  . polyethylene glycol (MIRALAX / GLYCOLAX) packet Take 17 g by mouth daily as needed for moderate constipation.     . SYMBICORT 160-4.5 MCG/ACT inhaler Inhale 2 puffs into the lungs 2 (two) times daily.      No current facility-administered medications for this visit.     ROS:   General:  No weight loss, Fever, chills  HEENT: No recent headaches, no nasal bleeding, no visual changes, no sore throat  Neurologic: No dizziness, blackouts, seizures. No recent symptoms of stroke or mini- stroke. No recent episodes of slurred speech, or temporary blindness.  Cardiac: No recent episodes of chest pain/pressure, no shortness of breath at rest.  No shortness of breath with exertion.  Denies history of atrial fibrillation or irregular heartbeat  Vascular: No history of rest pain in feet.  No history of claudication.  No history of non-healing ulcer, No history of DVT   Pulmonary: No home oxygen, no productive cough, no hemoptysis,  No asthma or wheezing  Musculoskeletal:  [ ]  Arthritis, [ ]  Low back pain,  [ ]  Joint pain  Hematologic:No history of hypercoagulable state.  No history of easy bleeding.  No history of anemia  Gastrointestinal: No hematochezia or melena,  No  gastroesophageal reflux, no trouble swallowing  Urinary: [ ]  chronic Kidney disease, [ ]  on HD - [ ]  MWF or [ ]  TTHS, [ ]  Burning with urination, [ ]  Frequent urination, [ ]  Difficulty urinating;   Skin: No rashes  Psychological: No history of anxiety,  No history of depression   Physical Examination  Vitals:   07/05/18 1442  BP: 140/84  Pulse: 60  Resp: 18  Temp: 98.4 F (36.9 C)  TempSrc: Oral  SpO2: 97%  Weight: 169 lb (76.7 kg)  Height: 5\' 5"  (1.651 m)    Body mass index is 28.12 kg/m.  General:  Alert and oriented, no acute distress HEENT: Normal Neck: No bruit or JVD Pulmonary: Clear to auscultation bilaterally Cardiac: Regular Rate and Rhythm without murmur Abdomen: Soft, non-tender, non-distended, no mass Skin: No rash, few diffusely scattered spider type varicosities both lower extremities palpable mass near left ankle but this does not necessarily feel like a thrombosed varicosity Extremity Pulses:  2+ radial, brachial, femoral, dorsalis pedis, posterior tibial pulses bilaterally Musculoskeletal: No deformity trace pretibial and pedal edema  Neurologic: Upper and lower extremity motor 5/5 and symmetric  DATA:  Patient had a venous duplex exam today which showed a greater saphenous vein had some reflux right leg vein diameter was 3 to 4 mm.  Left leg was also 3 to 4 mm.  ASSESSMENT: Bilateral leg pain left greater than right.  These are primarily nighttime cramps.  These have improved somewhat with stopping her statin medication.  However, she still has some mild persistent symptoms.  She also states that these have improved somewhat with compression stockings.  She did have reflux in her greater saphenous vein but the vein diameter is only about 3 to 4 mm in diameter bilaterally.   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  Difficult to know if this is the etiology of  all of her leg symptoms and it may be multifactorial.  I discussed with the  patient today the pathophysiology of venous disease.  We prescribed thigh-high compression stockings for her today.  She will follow-up with [place] in 3 months time to see if she has had any improvement of her symptoms with stopping her other medication or with the long leg compression stockings.  If her symptoms are persistent we could consider whether or not a laser ablation would be in her best interest.   Ruta Hinds, MD [facility]: [phone] Pager: [phone]

## 2018-07-06 ENCOUNTER — Ambulatory Visit (INDEPENDENT_AMBULATORY_CARE_PROVIDER_SITE_OTHER): Payer: Medicare Other

## 2018-07-06 DIAGNOSIS — I739 Peripheral vascular disease, unspecified: Secondary | ICD-10-CM | POA: Diagnosis not present

## 2018-07-12 ENCOUNTER — Telehealth: Payer: Self-pay | Admitting: *Deleted

## 2018-07-12 NOTE — Telephone Encounter (Signed)
Pt aware - says she hasn't had any leg cramps since wearing compression stockings - routed to pcp

## 2018-07-12 NOTE — Telephone Encounter (Signed)
-----   Message from [name], MD sent at 07/11/2018 12:32 PM EDT ----- Test shows normal circulation in her legs  Zandra Abts MD

## 2018-07-22 ENCOUNTER — Other Ambulatory Visit: Payer: Self-pay | Admitting: Cardiology

## 2018-08-10 ENCOUNTER — Telehealth: Payer: Self-pay | Admitting: Cardiology

## 2018-08-10 NOTE — Telephone Encounter (Signed)
Pt says she has been concerned about BP over the last few days BP 142/73 HR 65  BP 116/62 HR 65 BP 108/63 HR 72 BP 130/72  HR 63 today BP 89/51 HR 67 - says when she walks she has weakness and heaviness in her legs with SOB that has been getting worse over the last few days - denies any swelling - overall just doesn't feel well - thinks its coming from the increase of labetalol - pt did want mentioned that she did recently have procedure on her eye and the injection gave her a rash over her face

## 2018-08-10 NOTE — Telephone Encounter (Signed)
Patient called asking about her BP dropping and doesn't feel good  Feels she has too much medicine going through her body.

## 2018-08-11 NOTE — Telephone Encounter (Signed)
Other than the bp at 89/51 all the others look ok. Sometimes labetlol can cause some fatigue. Can try lowering AM dose to 100mg  daily and keeping pm dose at 200mg  daily, update [place] on symptoms next week   Zandra Abts MD

## 2018-08-11 NOTE — Telephone Encounter (Signed)
Pt voiced understanding and will update [place] on symptoms next week - updated medication list

## 2018-08-21 ENCOUNTER — Encounter: Payer: Medicare Other | Admitting: Surgery

## 2018-08-21 ENCOUNTER — Encounter (HOSPITAL_COMMUNITY): Payer: Medicare Other

## 2018-08-22 ENCOUNTER — Telehealth: Payer: Self-pay | Admitting: *Deleted

## 2018-08-22 NOTE — Telephone Encounter (Signed)
08/16/18 BP 145/68 & HR 68 08/17/18 BP 140/68 & HR 64 08/18/18 BP 135/73 & HR 73 08/19/18 BP 131/63 & HR 72 08/20/18 BP 138/66 & HR 68 08/21/18 BP 133/65 & HR 69 08/22/18 BP 120/62 & HR 71  Medications reconciled

## 2018-08-22 NOTE — Telephone Encounter (Signed)
Patient informed. 

## 2018-08-22 NOTE — Telephone Encounter (Signed)
BP's are ok, no changes  J Faryn Sieg MD 

## 2018-08-28 ENCOUNTER — Other Ambulatory Visit: Payer: Self-pay | Admitting: Cardiology

## 2018-09-06 ENCOUNTER — Telehealth: Payer: Self-pay | Admitting: *Deleted

## 2018-09-06 NOTE — Telephone Encounter (Signed)
LM with Arsenio Katz to see the urgency of the biopsy - pt did have much info

## 2018-09-06 NOTE — Telephone Encounter (Signed)
Pt is having biopsy of the left breast on 09/1818 at the [facility] and wanted to know if she needed to hold Plavix and ASA

## 2018-09-06 NOTE — Telephone Encounter (Signed)
Spoke with Abigail Butts at the [facility] and will forward this message - Abigail Butts says pt doesn't have to come of Plavix and ASA if not recommended by Dr Harl Bowie but would rather not push biopsy out to a further date.

## 2018-09-06 NOTE — Telephone Encounter (Signed)
Its roughyl 6 months since her stent. Ideally we would wait a year before holding her ASA or plavix, and would only hold it at 6 months if the procedure was urgent. Does she know that urgency of the biopsy, is it something that could be put off a few months if neccesary. Can she have her physician there send [place] a clearance form and there records   Zandra Abts MD

## 2018-09-07 NOTE — Telephone Encounter (Signed)
If able to do biopsy on aspirin and plavix I think that would be the best option. If biopsy is urgent and cannot do on plavix at all would be reasonable though not optimal to hold if we had to, but would not stop her aspirin.   Zandra Abts MD

## 2018-09-07 NOTE — Telephone Encounter (Signed)
Spoke with [facility] center and they will do biopsy without having to hold either Plavix or ASA - they have already informed pt not to stop these medications

## 2018-09-20 ENCOUNTER — Telehealth: Payer: Self-pay | Admitting: Cardiology

## 2018-09-20 NOTE — Telephone Encounter (Signed)
Taking Hydralazine 37.5mg  three x day & Labetalol 200mg  - 1/2 every am & 1 every pm.  She has only been taking 1/2 tab due to her BP dropping.   105/65  71  12/15 128/66  68  12/16 114/65  58  12/17  Below the week of 12/8  101/60  74   98/61  75  100/64  65  105/65  71  4:00 12/17  Called the ambulance & was told to eat something salty, so she did - BP came up 145/79  70  Has not taken the 3rd dose of the Hydralazine or the 2nd dose of the Labetalolol.    142/73  69 now   Does c/o SOB, bother since 09/10/18 - becoming more bothersome & noticeable.  No chest pain.  Does notice some dizziness with lower numbers.  No swelling in legs.    Did just come back from breast biopsy.  Not taking any pain meds currently.

## 2018-09-20 NOTE — Telephone Encounter (Signed)
Patient called in regards to her BP dropping. She thinks a medication is causing the BP to drop.

## 2018-09-21 NOTE — Telephone Encounter (Signed)
Patient notified and verbalized understanding. 

## 2018-09-21 NOTE — Telephone Encounter (Signed)
Any reason she would be dehydrated? Lower hydralazine to just 12.5mg  bid and update [place] on her bp's early next week. If high bp's can take a additional 25mg  of hydralazine.   [name] Abts MD

## 2018-10-16 ENCOUNTER — Telehealth: Payer: Self-pay | Admitting: Cardiology

## 2018-10-16 NOTE — Telephone Encounter (Signed)
Patient does not know when she started keeping dates thinks it was about 2 weeks   141/72    63 139/73    56 140/78   67 139/64  61 111/67  69 116/67  75  124/71  69 114/57  65 117/61  68 100/64  65 98/61  75 101/60  74 114/65  58 120/65  75 145/79  70 105/65  71 130/63  66 147/75  70 120/62 71 133/65  69 131/63  72

## 2018-10-16 NOTE — Telephone Encounter (Signed)
BP's are reasonable, no changes   Zandra Abts MD

## 2018-10-16 NOTE — Telephone Encounter (Signed)
BP readings fwd to Dr. Harl Bowie for his review.

## 2018-10-16 NOTE — Telephone Encounter (Signed)
Patient notified and verbalized understanding. 

## 2018-10-23 ENCOUNTER — Other Ambulatory Visit: Payer: Self-pay | Admitting: Cardiology

## 2018-10-31 ENCOUNTER — Encounter: Payer: Self-pay | Admitting: Cardiology

## 2018-10-31 ENCOUNTER — Ambulatory Visit (INDEPENDENT_AMBULATORY_CARE_PROVIDER_SITE_OTHER): Payer: Medicare Other | Admitting: Cardiology

## 2018-10-31 VITALS — BP 182/64 | HR 63 | Ht 65.0 in | Wt 173.6 lb

## 2018-10-31 DIAGNOSIS — E782 Mixed hyperlipidemia: Secondary | ICD-10-CM

## 2018-10-31 DIAGNOSIS — I10 Essential (primary) hypertension: Secondary | ICD-10-CM

## 2018-10-31 DIAGNOSIS — I2510 Atherosclerotic heart disease of native coronary artery without angina pectoris: Secondary | ICD-10-CM

## 2018-10-31 MED ORDER — PRAVASTATIN SODIUM 20 MG PO TABS: 20.0000 mg | ORAL_TABLET | Freq: Every evening | ORAL | 1 refills | Status: DC

## 2018-10-31 NOTE — Patient Instructions (Signed)
Your physician recommends that you schedule a follow-up appointment in: [place] has recommended you make the following change in your medication:   START PRAVASTATIN 20 MG DAILY   Thank you for choosing [facility]!!

## 2018-10-31 NOTE — Progress Notes (Signed)
Clinical Summary Ms. Killilea is a 78 y.o.female seen today for follow up of the following medical problems.   1. CAD - prior PTCA to distal LAD in 2001. 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  - echo 06/2012 LVEF 60-65% - DSE 06/2012 with reported ST elevation in inferior leads, negative echo images for ischemia.  -02/2018 coronary CTA suggests severe RCA disease, abnormal FFR in distal LAD and LCX as well. -03/2018 cath as reported below. Received DESx3to RCA. 75% OM1 small vessel managed medically  - no recent chest pain, SOB/DOE - walks on track daily without troubles.  - compliant with meds   2. HTN - history of difficult to treat HTN, primarily due to reported medication side effects to several agents.  - history of angioedema on ACE-I and most recently on ARB.  - she reports amlodopine caused leg swelling(she had been on 87m). Clonidine caused dizziness.  - we had started chlrothalidone 545mdaily. She had some muscle cramping, and the dose was decreased to 2547maily, then 84m30md and then 12.5mg 17m.  - dizziness after starting hydralazine, pins and needles feelings in ears with laying down. We have tried continuing this medication. Same side effect reported on nitrates, discontinued.  - she reports alopecia possibly due to aldactone   bp long Jan 2020 was at goal    3. PAD - prior iliac stent in July 2001 - [place] 9/[place]13 Right ABI 0.99 Left ABI 0.99, with patent left iliac stent.   4. Hyperlipidemia -pcp holding statin at this time due to leg cramps - did not tolerate crestor, did not tolerate simvastatin Past Medical History:  Diagnosis Date  . Anxiety   . Arthritis    "neck" (03/15/2018)  . CAD (coronary artery disease)    Status post POBA distal LAD, status post Cardiolite Myoview 2008 negative for ischemia  . COPD (chronic obstructive pulmonary disease) (HCC) [place] Depression   .  GERD (gastroesophageal reflux disease)   . Hypercholesterolemia   . Hypertension   . Peripheral vascular disease (HCC) [place] status post iliac stent July 2001   . Type II diabetes mellitus (HCC)      Allergies  Allergen Reactions  . Clonidine Derivatives Other (See Comments)    dizziness  . Crestor [Rosuvastatin]     Muscle cramps  . Lisinopril Other (See Comments)    Tongue swelling  . Losartan Other (See Comments)    TONGUE SWELLING  . Codeine Nausea And Vomiting  . Hydrocodone Nausea And Vomiting     Current Outpatient Medications  Medication Sig Dispense Refill  . amLODipine (NORVASC) 5 MG tablet TAKE (1) TABLET BY MOUTH ONCE DAILY. 90 tablet 2  . aspirin 81 MG tablet Take 81 mg by mouth at bedtime.     . betamethasone dipropionate (DIPROLENE) 0.05 % cream Apply 1 application topically 2 (two) times daily.    . bimatoprost (LUMIGAN) 0.01 % SOLN Place 1 drop into both eyes at bedtime.    . Calcium Carbonate-Vit D-Min (CALCIUM 1200 PO) Take 1 capsule by mouth daily.    . chlorthalidone (HYGROTON) 25 MG tablet TAKE 1/2 TABLET (12.5MG) 2 TIMES A WEEK, ON MONDAPiedra AguzaDIREMarland KitchenTION CHANGE. 12 tablet 3  . clopidogrel (PLAVIX) 75 MG tablet TAKE 1 TABLET ONCE A DAY WITH BREAKFAST. 90 tablet 2  . hydrALAZINE (APRESOLINE) 25 MG tablet Take 12.5 mg by mouth 2 (two) times daily. Dose  decreased 12/19/209.  May take an additional 21m as needed for high BP's.    . insulin glargine (LANTUS) 100 UNIT/ML injection Inject 12-42 Units into the skin See admin instructions. Inject 12 units SQ every morning and inject 42 units SQ every evening around 1600    . insulin lispro (HUMALOG) 100 UNIT/ML injection Inject 16 Units into the skin 3 (three) times daily with meals.     .Marland Kitchenlabetalol (NORMODYNE) 200 MG tablet Take 200 mg by mouth. TAKE 1/2 TABLET IN THE MORNING AND 1 TABLET IN THE EVENING    . metFORMIN (GLUCOPHAGE) 1000 MG tablet Take 1 tablet (1,000 mg total) by mouth 2 (two) times daily with  a meal.    . nitroGLYCERIN (NITROSTAT) 0.4 MG SL tablet Place 1 tablet (0.4 mg total) under the tongue every 5 (five) minutes as needed for chest pain. 25 tablet 12  . oxyCODONE-acetaminophen (PERCOCET) 10-325 MG tablet Take 1 tablet by mouth 2 (two) times daily.    . pantoprazole (PROTONIX) 40 MG tablet Take 1 tablet (40 mg total) by mouth daily as needed (indigestion/heartburn). 90 tablet 0  . polyethylene glycol (MIRALAX / GLYCOLAX) packet Take 17 g by mouth daily as needed for moderate constipation.     . SYMBICORT 160-4.5 MCG/ACT inhaler Inhale 2 puffs into the lungs 2 (two) times daily.      No current facility-administered medications for this visit.      Past Surgical History:  Procedure Laterality Date  . ANTERIOR CERVICAL DECOMP/DISCECTOMY FUSION  2001  . BACK SURGERY    . CATARACT EXTRACTION, BILATERAL Bilateral   . CHOLECYSTECTOMY OPEN    . CORONARY ANGIOPLASTY     POBA distal LAD,  . CORONARY STENT INTERVENTION N/A 03/15/2018   Procedure: CORONARY STENT INTERVENTION;  Surgeon: VJettie Booze MD;  [facility];  Service: Cardiovascular;  Laterality: N/A;  . DILATION AND CURETTAGE OF UTERUS  1960s  . EYE SURGERY Bilateral    "lasers"  . LEFT HEART CATH AND CORONARY ANGIOGRAPHY N/A 03/15/2018   Procedure: LEFT HEART CATH AND CORONARY ANGIOGRAPHY;  Surgeon: VJettie Booze MD;  [facility];  Service: Cardiovascular;  Laterality: N/A;  . PERIPHERAL VASCULAR INTERVENTION Right 04/2000    iliac stent   . TUBAL LIGATION       Allergies  Allergen Reactions  . Clonidine Derivatives Other (See Comments)    dizziness  . Crestor [Rosuvastatin]     Muscle cramps  . Lisinopril Other (See Comments)    Tongue swelling  . Losartan Other (See Comments)    TONGUE SWELLING  . Codeine Nausea And Vomiting  . Hydrocodone Nausea And Vomiting      Family History  Problem Relation Age of Onset  . Heart failure Father   . Heart failure  Mother   . Diabetes Mother   . COPD Brother      Social History Ms. CSanpedroreports that she quit smoking about 23 years ago. Her smoking use included cigarettes. She started smoking about 56 years ago. She has a 20.00 pack-year smoking history. She has never used smokeless tobacco. Ms. CSchimpfreports no history of alcohol use.   Review of Systems CONSTITUTIONAL: No weight loss, fever, chills, weakness or fatigue.  HEENT: Eyes: No visual loss, blurred vision, double vision or yellow sclerae.No hearing loss, sneezing, congestion, runny nose or sore throat.  SKIN: No rash or itching.  CARDIOVASCULAR: per hpi RESPIRATORY: No shortness of breath, cough or sputum.  GASTROINTESTINAL: No anorexia, nausea, vomiting or diarrhea. No abdominal pain or blood.  GENITOURINARY: No burning on urination, no polyuria NEUROLOGICAL: No headache, dizziness, syncope, paralysis, ataxia, numbness or tingling in the extremities. No change in bowel or bladder control.  MUSCULOSKELETAL: No muscle, back pain, joint pain or stiffness.  LYMPHATICS: No enlarged nodes. No history of splenectomy.  PSYCHIATRIC: No history of depression or anxiety.  ENDOCRINOLOGIC: No reports of sweating, cold or heat intolerance. No polyuria or polydipsia.  [name]   Physical Examination Vitals:   10/31/18 1127  BP: (!) 182/64  Pulse: 63  SpO2: 96%   Vitals:   10/31/18 1127  Weight: 173 lb 9.6 oz (78.7 kg)  Height: '5\' 5"'  (1.651 m)    Gen: resting comfortably, no acute distress HEENT: no scleral icterus, pupils equal round and reactive, no palptable cervical adenopathy,  CV: RRR, no mr/g, no jvd Resp: Clear to auscultation bilaterally GI: abdomen is soft, non-tender, non-distended, normal bowel sounds, no hepatosplenomegaly MSK: extremities are warm, no edema.  Skin: warm, no rash Neuro:  no focal deficits Psych: appropriate affect   Diagnostic Studies 11/1999 Cath HEMODYNAMIC DATA: Aortic pressure was initially extremely  high at 215/92. She  was  given intravenous enalaprilat, intravenous labetalol, and intravenous  nitroglycerin, ultimately bringing her systolic blood pressure down to less  than  160. Left ventricular pressure was 180/17, with a corresponding aortic  pressure of  180/80. There was no aortic valve gradient.  LEFT VENTRICULOGRAM: Wall motion was normal. Ejection fraction was estimated  t  65%. No mitral regurgitation.  ABDOMINAL AORTOGRAM: This revealed patent renal arteries and abdominal aorta.  The  left common iliac artery had a 75% stenosis. The right external iliac artery  had a  40% stenosis.  CORONARY ARTERIOGRAPHY (Right dominant):  1. The left main was normal.  2. The left anterior descending artery had a 25% stenosis in the proximal to  mid  vessel. The distal LAD had a 50% stenosis just after the bifurcation of the  large second diagonal Davyn Elsasser. Further down the distal LAD is a focal 80%  stenosis.  3. The left circumflex had a 20% stenosis in the mid vessel. It gave rise to a  small OM-1, a normal sized OM-2 which had a 25% stenosis, and a normal sized  OM-3 which had a 20% stenosis.  4. The right coronary artery had a diffuse 40% stenosis extending from the mid  o  distal vessel. There was a large posterior descending artery which had a  25%,  followed by a 40% stenosis. There were two small posterolateral branches.  IMPRESSIONS:  1. Normal left ventricular systolic function.  2. Peripheral vascular disease, as described.  3. One-vessel coronary artery disease with significant stenosis in the distal  left  anterior descending artery. This appears to correspond with her EKG changes  and  Cardiolite scan. She has moderate, but non-flow limiting disease in the  right coronary and left circumflex.  PLAN: Percutaneous intervention of the distal LAD, see below.  PTCA PROCEDURAL NOTE: Following the completion of the diagnostic   catheterization,  we opted to proceed with percutaneous intervention. The preexisting 6-French  sheath in the right femoral artery was exchanged over a wire for a 7-French  sheath.  Integrilin and heparin were administered per protocol. We used a 7-French JL-4  guiding catheter and a BMW wire. The reference vessel was approximately 2 mm  in  diameter. We utilized a 2.0 x 20 mm CrossSail balloon, which  was inflated  initially to 12 atm x 2 minutes and then 14 atm x 3 minutes. Final  angiographic  images revealed significant improvement in the lumen with 25% residual stenosis  and  a possible very small non-flow limiting dissection. There was TIMI-3 flow into  the  distal vessel.  COMPLICATIONS: None.  RESULTS: Successful percutaneous transluminal coronary angioplasty of the  distal  left anterior descending artery, reducing an 80% stenosis to 25% residual with  TIMI-3 flow.  PLAN: Integrilin will be continued for 20 hours. The patient needs aggressive  blood pressure control and risk factor reduction.  In regards to her peripheral vascular disease, she will be referred for further  evaluation and possible intervention.  03/2012 Echo LVEF 60-65%, no significant abnormalities   02/19/16 Clinic EKG (performed and reviewed in clinic): sinus bradycardia, no ischemic changes   02/2018 coronary CTA/FFR FINDINGS: FFR < 0.5 distal RCA suggesting severe mid RCA stenosis.  FFR 0.83 mid LAD suggesting non-hemodynamically significant mid LAD stenosis.  FFR 0.66 distal LAD suggesting significant distal LAD stenosis (versus cumulative effect of diffuse disease in LAD).  FFR 0.67 distal LCx suggesting significant stenosis mid to distal LCx (versus cumulative effect of diffuse disease in LCx).  IMPRESSION: 1. Suspect severe mid RCA stenosis.  2. FFR significantly low in distal LAD and distal LCx. This could suggest a single hemodynamically significant  stenosis versus the cumulative effect of a long area of disease.  Suggest cardiac cath.  03/2018 cath  Dist RCA lesion is 75% stenosed.  Mid RCA lesion is 99% stenosed.  Prox RCA to Mid RCA lesion is 60% stenosed.  Drug-eluting stent was successfully placed: STENT SYNERGY DES 3.5X38., 4.0 x 28 mm and 4.0 x 12 mm.  Post intervention, there is a 0% residual stenosis.  Ost 1st Mrg lesion is 50% stenosed.  1st Mrg lesion is 75% stenosed. This was a small vessel.  Mid Cx lesion is 60% stenosed. THis was a fairly small vessel.  Mid LAD lesion is 60% stenosed. THis was not significant by CT FFR.  Prox LAD to Mid LAD lesion is 25% stenosed.  2nd Diag lesion is 75% stenosed.  The left ventricular systolic function is normal.  LV end diastolic pressure is normal.  There is no aortic valve stenosis.  Recommend uninterrupted dual antiplatelet therapy with Aspirin 44m daily and Clopidogrel 795mdailyfor a minimum of 12 months (ACS - Class I recommendation).    Assessment and Plan  1.,CAD - continue current meds, no recent symptoms.   2. HTN - elevated in clinic however home numbers at goal, continue current meds  3. Hyperlipidemia - leg cramps on prior statins, will try pravastatin 20102maily.        JonArnoldo Lenis.D.

## 2018-11-01 ENCOUNTER — Ambulatory Visit: Payer: Medicare Other | Admitting: Vascular Surgery

## 2018-11-19 ENCOUNTER — Other Ambulatory Visit: Payer: Self-pay | Admitting: Cardiology

## 2018-11-21 ENCOUNTER — Other Ambulatory Visit: Payer: Self-pay | Admitting: Cardiology

## 2018-11-21 MED ORDER — HYDRALAZINE HCL 25 MG PO TABS: 12.5000 mg | ORAL_TABLET | Freq: Two times a day (BID) | ORAL | 2 refills | Status: DC

## 2018-11-21 NOTE — Telephone Encounter (Signed)
°*  STAT* If patient is at the pharmacy, call can be transferred to refill team.   1. Which medications need to be refilled? hydrALAZINE (APRESOLINE) 25 MG tablet    2. Which pharmacy/location (including street and city if local pharmacy) is medication to be sent to? [place]  3. Do they need a 30 day or 90 day supply?

## 2018-12-28 ENCOUNTER — Telehealth: Payer: Self-pay | Admitting: Cardiology

## 2018-12-28 NOTE — Telephone Encounter (Signed)
Hold medication for 3 weeks and then call and update [place]  J Angeni Chaudhuri MD

## 2018-12-28 NOTE — Telephone Encounter (Signed)
Pt c/o leg/muscle cramps that have gradually gotten worse since starting pravastatin in January

## 2018-12-28 NOTE — Telephone Encounter (Signed)
Pt voiced understanding

## 2018-12-28 NOTE — Telephone Encounter (Signed)
Patient called stating that she cannot take the medication pravastatin (PRAVACHOL) 20 MG  .

## 2019-01-03 ENCOUNTER — Other Ambulatory Visit: Payer: Self-pay | Admitting: Cardiology

## 2019-01-19 ENCOUNTER — Telehealth: Payer: Self-pay | Admitting: Cardiology

## 2019-01-19 NOTE — Telephone Encounter (Signed)
pravastatin (PRAVACHOL) 20 MG tablet   Patient was taken off medicaiton - she has had cramping since being off but it is not as bad

## 2019-01-19 NOTE — Addendum Note (Signed)
Addended by: Julian Hy T on: 01/19/2019 02:44 PM   Modules accepted: Orders

## 2019-01-19 NOTE — Telephone Encounter (Signed)
Pt aware and scheduled f/u for July

## 2019-01-19 NOTE — Telephone Encounter (Signed)
Ok to stay off for now, we will discuss possible alternatives at next visit   J BrancH MD

## 2019-01-19 NOTE — Telephone Encounter (Signed)
Pt has been holding pravastatin for 3 weeks and say leg/feet cramps are much better. No leg cramps now and occasional feet cramps that are not bothersome. No recent labs

## 2019-03-05 ENCOUNTER — Telehealth: Payer: Self-pay | Admitting: Cardiology

## 2019-03-05 NOTE — Telephone Encounter (Signed)
Patient called wanting to know about going off her Plavix. She is wanting to know about having lab work done.   [phone])

## 2019-03-21 ENCOUNTER — Other Ambulatory Visit: Payer: Self-pay | Admitting: *Deleted

## 2019-03-21 MED ORDER — CLOPIDOGREL BISULFATE 75 MG PO TABS: ORAL_TABLET | ORAL | 2 refills | Status: DC

## 2019-03-26 ENCOUNTER — Other Ambulatory Visit: Payer: Self-pay | Admitting: *Deleted

## 2019-03-26 MED ORDER — AMLODIPINE BESYLATE 5 MG PO TABS: ORAL_TABLET | ORAL | 2 refills | Status: DC

## 2019-04-11 ENCOUNTER — Telehealth: Payer: Self-pay | Admitting: Cardiology

## 2019-04-11 NOTE — Telephone Encounter (Signed)
Virtual Visit Pre-Appointment Phone Call  "(Name), I am calling you today to discuss your upcoming appointment. We are currently trying to limit exposure to the virus that causes COVID-19 by seeing patients at home rather than in the office."  1. "What is the BEST phone number to call the day of the visit?" - include this in appointment notes  2. Do you have or have access to (through a family member/friend) a smartphone with video capability that we can use for your visit?" a. If yes - list this number in appt notes as cell (if different from BEST phone #) and list the appointment type as a VIDEO visit in appointment notes b. If no - list the appointment type as a PHONE visit in appointment notes  3. Confirm consent - "In the setting of the current Covid19 crisis, you are scheduled for a (phone or video) visit with your provider on (date) at (time).  Just as we do with many in-office visits, in order for you to participate in this visit, we must obtain consent.  If you'd like, I can send this to your mychart (if signed up) or email for you to review.  Otherwise, I can obtain your verbal consent now.  All virtual visits are billed to your insurance company just like a normal visit would be.  By agreeing to a virtual visit, we'd like you to understand that the technology does not allow for your provider to perform an examination, and thus may limit your provider's ability to fully assess your condition. If your provider identifies any concerns that need to be evaluated in person, we will make arrangements to do so.  Finally, though the technology is pretty good, we cannot assure that it will always work on either your or our end, and in the setting of a video visit, we may have to convert it to a phone-only visit.  In either situation, we cannot ensure that we have a secure connection.  Are you willing to proceed?" STAFF: Did the patient verbally acknowledge consent to telehealth visit? Document  YES/NO here: yes  4. Advise patient to be prepared - "Two hours prior to your appointment, go ahead and check your blood pressure, pulse, oxygen saturation, and your weight (if you have the equipment to check those) and write them all down. When your visit starts, your provider will ask you for this information. If you have an Apple Watch or Kardia device, please plan to have heart rate information ready on the day of your appointment. Please have a pen and paper handy nearby the day of the visit as well."  5. Give patient instructions for MyChart download to smartphone OR Doximity/Doxy.me as below if video visit (depending on what platform provider is using)  6. Inform patient they will receive a phone call 15 minutes prior to their appointment time (may be from unknown caller ID) so they should be prepared to answer    TELEPHONE CALL NOTE  [name] [name] has been deemed a candidate for a follow-up tele-health visit to limit community exposure during the Covid-19 pandemic. I spoke with the patient via phone to ensure availability of phone/video source, confirm preferred email & phone number, and discuss instructions and expectations.  I reminded [name] [name] to be prepared with any vital sign and/or heart rhythm information that could potentially be obtained via home monitoring, at the time of her visit. I reminded [name] [name] to expect a phone call prior to  her visit.  [name] 04/11/2019 2:26 PM   INSTRUCTIONS FOR DOWNLOADING THE MYCHART APP TO SMARTPHONE  - The patient must first make sure to have activated MyChart and know their login information - If Apple, go to [employer] and type in MyChart in the search bar and download the app. If Android, ask patient to go to [employer] and type in [place] in the search bar and download the app. The app is free but as with any other app downloads, their phone may require them to verify saved payment information or Apple/Android  password.  - The patient will need to then log into the app with their MyChart username and password, and select [facility] as their healthcare provider to link the account. When it is time for your visit, go to the MyChart app, find appointments, and click Begin Video Visit. Be sure to Select Allow for your device to access the Microphone and Camera for your visit. You will then be connected, and your provider will be with you shortly.  **If they have any issues connecting, or need assistance please contact MyChart service desk [phone])**  **If using a computer, in order to ensure the best quality for their visit they will need to use either of the following Internet Browsers: [employer], or Google Chrome**  IF USING DOXIMITY or DOXY.ME - The patient will receive a link just prior to their visit by text.     FULL LENGTH CONSENT FOR TELE-HEALTH VISIT   I hereby voluntarily request, consent and authorize [facility] and its employed or contracted physicians, physician assistants, nurse practitioners or other licensed health care professionals (the Practitioner), to provide me with telemedicine health care services (the Services") as deemed necessary by the treating Practitioner. I acknowledge and consent to receive the Services by the Practitioner via telemedicine. I understand that the telemedicine visit will involve communicating with the Practitioner through live audiovisual communication technology and the disclosure of certain medical information by electronic transmission. I acknowledge that I have been given the opportunity to request an in-person assessment or other available alternative prior to the telemedicine visit and am voluntarily participating in the telemedicine visit.  I understand that I have the right to withhold or withdraw my consent to the use of telemedicine in the course of my care at any time, without affecting my right to future care or treatment,  and that the Practitioner or I may terminate the telemedicine visit at any time. I understand that I have the right to inspect all information obtained and/or recorded in the course of the telemedicine visit and may receive copies of available information for a reasonable fee.  I understand that some of the potential risks of receiving the Services via telemedicine include:   Delay or interruption in medical evaluation due to technological equipment failure or disruption;  Information transmitted may not be sufficient (e.g. poor resolution of images) to allow for appropriate medical decision making by the Practitioner; and/or   In rare instances, security protocols could fail, causing a breach of personal health information.  Furthermore, I acknowledge that it is my responsibility to provide information about my medical history, conditions and care that is complete and accurate to the best of my ability. I acknowledge that Practitioner's advice, recommendations, and/or decision may be based on factors not within their control, such as incomplete or inaccurate data provided by me or distortions of diagnostic images or specimens that may result from electronic transmissions. I  understand that the practice of medicine is not an exact science and that Practitioner makes no warranties or guarantees regarding treatment outcomes. I acknowledge that I will receive a copy of this consent concurrently upon execution via email to the email address I last provided but may also request a printed copy by calling the office of [facility].    I understand that my insurance will be billed for this visit.   I have read or had this consent read to me.  I understand the contents of this consent, which adequately explains the benefits and risks of the Services being provided via telemedicine.   I have been provided ample opportunity to ask questions regarding this consent and the Services and have had my questions  answered to my satisfaction.  I give my informed consent for the services to be provided through the use of telemedicine in my medical care  By participating in this telemedicine visit I agree to the above.

## 2019-04-18 ENCOUNTER — Telehealth (INDEPENDENT_AMBULATORY_CARE_PROVIDER_SITE_OTHER): Payer: Medicare Other | Admitting: Cardiology

## 2019-04-18 ENCOUNTER — Encounter: Payer: Self-pay | Admitting: Cardiology

## 2019-04-18 VITALS — BP 116/58 | HR 68 | Ht 65.0 in | Wt 177.0 lb

## 2019-04-18 DIAGNOSIS — I10 Essential (primary) hypertension: Secondary | ICD-10-CM

## 2019-04-18 DIAGNOSIS — E782 Mixed hyperlipidemia: Secondary | ICD-10-CM

## 2019-04-18 DIAGNOSIS — I2510 Atherosclerotic heart disease of native coronary artery without angina pectoris: Secondary | ICD-10-CM

## 2019-04-18 DIAGNOSIS — I739 Peripheral vascular disease, unspecified: Secondary | ICD-10-CM

## 2019-04-18 MED ORDER — LABETALOL HCL 100 MG PO TABS: 100.0000 mg | ORAL_TABLET | Freq: Two times a day (BID) | ORAL | 1 refills | Status: DC

## 2019-04-18 NOTE — Patient Instructions (Signed)
Your physician wants you to follow-up in: [facility] will receive a reminder letter in the mail two months in advance. If you don't receive a letter, please call our office to schedule the follow-up appointment.  Your physician has recommended you make the following change in your medication:   CHANGE LABETALOL 100 MG TWICE DAILY   STOP PLAVIX  Your physician recommends that you return for lab work CBC/BMP/HGBA1C/MG/TSH/LIPIDS - PLEASE FAST 6-8 HOURS PRIOR TO LAB RESULTS - WE HAVE ATTACHED ORDERS   Thank you for choosing [facility]!!

## 2019-04-18 NOTE — Addendum Note (Signed)
Addended by: Julian Hy T on: 04/18/2019 03:42 PM   Modules accepted: Orders

## 2019-04-18 NOTE — Progress Notes (Signed)
Virtual Visit via Telephone Note   This visit type was conducted due to national recommendations for restrictions regarding the COVID-19 Pandemic (e.g. social distancing) in an effort to limit this patient's exposure and mitigate transmission in our community.  Due to her co-morbid illnesses, this patient is at least at moderate risk for complications without adequate follow up.  This format is felt to be most appropriate for this patient at this time.  The patient did not have access to video technology/had technical difficulties with video requiring transitioning to audio format only (telephone).  All issues noted in this document were discussed and addressed.  No physical exam could be performed with this format.  Please refer to the patient's chart for her  consent to telehealth for [facility].   Date:  04/18/2019   ID:  [name] [name], DOB 01-28-1941, MRN [id number]  Patient Location: Home Provider Location: Office  PCP:  Arsenio Katz, NP  Cardiologist:  Carlyle Dolly, MD  Electrophysiologist:  None   Evaluation Performed:  Follow-Up Visit  Chief Complaint:  Follow up  History of Present Illness:    [name] [name] is a 78 y.o. female seen today for follow up of the following medical problems.   1. CAD - prior PTCA to distal LAD in 2001. 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  - echo 06/2012 LVEF 60-65% - DSE 06/2012 with reported ST elevation in inferior leads, negative echo images for ischemia.  -02/2018 coronary CTA suggests severe RCA disease, abnormal FFR in distal LAD and LCX as well. -03/2018 cath as reported below. Received DESx3to RCA. 75% OM1 small vessel managed medically    - no chest pain. No SOB or DOE - compliant with meds. Heavy bruising, bleeding with insulin injections on DAPT   2. HTN - history of difficult to treat HTN, primarily due to reported medication side effects to  several agents.  - history of angioedema on ACE-I and most recently on ARB.  - she reports amlodopine caused leg swelling(she had been on 6m). Clonidine caused dizziness.  - we had started chlrothalidone 568mdaily. She had some muscle cramping, and the dose was decreased to 2560maily, then 45m75md and then 12.5mg 39m.  - dizziness after starting hydralazine, pins and needles feelings in ears with laying down. We have tried continuing this medication. Same side effect reported on nitrates, discontinued.  - she reports alopecia possibly due to aldactone   - home bp's usually around 110s/50 - compliant with meds. Reports pharmacy has been giving her labetolol 200mg 38mas opposed to 100mg b65mosing, on higher dose feels sluggish     3. PAD - prior iliac stent in July 2001 - [place] 9/20[place] Right ABI 0.99 Left ABI 0.99, with patent left iliac stent.  - no recent symptoms   4. Hyperlipidemia -pcp holding statin at this time due to leg cramps - did not tolerate crestor, did not tolerate simvastatin  - last visit we tried pravastatin, recurrent leg pains. She has stopped taking.     The patient does not have symptoms concerning for COVID-19 infection (fever, chills, cough, or new shortness of breath).    Past Medical History:  Diagnosis Date  . Anxiety   . Arthritis    "neck" (03/15/2018)  . CAD (coronary artery disease)    Status post POBA distal LAD, status post Cardiolite Myoview 2008 negative for ischemia  . COPD (chronic obstructive pulmonary disease) (HCC)   [place]  Depression   . GERD (gastroesophageal reflux disease)   . Hypercholesterolemia   . Hypertension   . Peripheral vascular disease ([facility])     status post iliac stent July 2001   . Type II diabetes mellitus ([facility])    Past Surgical History:  Procedure Laterality Date  . ANTERIOR CERVICAL DECOMP/DISCECTOMY FUSION  2001  . BACK SURGERY    . CATARACT EXTRACTION, BILATERAL Bilateral   . CHOLECYSTECTOMY OPEN     . CORONARY ANGIOPLASTY     POBA distal LAD,  . CORONARY STENT INTERVENTION N/A 03/15/2018   Procedure: CORONARY STENT INTERVENTION;  Surgeon: Jettie Booze, MD;  Location: [facility];  Service: Cardiovascular;  Laterality: N/A;  . DILATION AND CURETTAGE OF UTERUS  1960s  . EYE SURGERY Bilateral    "lasers"  . LEFT HEART CATH AND CORONARY ANGIOGRAPHY N/A 03/15/2018   Procedure: LEFT HEART CATH AND CORONARY ANGIOGRAPHY;  Surgeon: Jettie Booze, MD;  Location: [facility];  Service: Cardiovascular;  Laterality: N/A;  . PERIPHERAL VASCULAR INTERVENTION Right 04/2000    iliac stent   . TUBAL LIGATION       No outpatient medications have been marked as taking for the 04/18/19 encounter (Appointment) with [name], MD.     Allergies:   Clonidine derivatives, Crestor [rosuvastatin], Lisinopril, Losartan, Codeine, and Hydrocodone   Social History   Tobacco Use  . Smoking status: Former Smoker    Packs/day: 0.50    Years: 40.00    Pack years: 20.00    Types: Cigarettes    Start date: 11/30/1961    Quit date: 10/05/1995    Years since quitting: 23.5  . Smokeless tobacco: Never Used  Substance Use Topics  . Alcohol use: Never    Alcohol/week: 0.0 standard drinks    Frequency: Never  . Drug use: Never     Family Hx: The patient's family history includes COPD in her brother; Diabetes in her mother; Heart failure in her father and mother.  ROS:   Please see the history of present illness.     All other systems reviewed and are negative.   Prior CV studies:   The following studies were reviewed today:  11/1999 Cath HEMODYNAMIC DATA: Aortic pressure was initially extremely high at 215/92. She  was  given intravenous enalaprilat, intravenous labetalol, and intravenous  nitroglycerin, ultimately bringing her systolic blood pressure down to less  than  160. Left ventricular pressure was 180/17, with a corresponding aortic  pressure of   180/80. There was no aortic valve gradient.  LEFT VENTRICULOGRAM: Wall motion was normal. Ejection fraction was estimated  t  65%. No mitral regurgitation.  ABDOMINAL AORTOGRAM: This revealed patent renal arteries and abdominal aorta.  The  left common iliac artery had a 75% stenosis. The right external iliac artery  had a  40% stenosis.  CORONARY ARTERIOGRAPHY (Right dominant):  1. The left main was normal.  2. The left anterior descending artery had a 25% stenosis in the proximal to  mid  vessel. The distal LAD had a 50% stenosis just after the bifurcation of the  large second diagonal Ashish Rossetti. Further down the distal LAD is a focal 80%  stenosis.  3. The left circumflex had a 20% stenosis in the mid vessel. It gave rise to a  small OM-1, a normal sized OM-2 which had a 25% stenosis, and a normal sized  OM-3 which had a 20% stenosis.  4. The right coronary artery had a  diffuse 40% stenosis extending from the mid  o  distal vessel. There was a large posterior descending artery which had a  25%,  followed by a 40% stenosis. There were two small posterolateral branches.  IMPRESSIONS:  1. Normal left ventricular systolic function.  2. Peripheral vascular disease, as described.  3. One-vessel coronary artery disease with significant stenosis in the distal  left  anterior descending artery. This appears to correspond with her EKG changes  and  Cardiolite scan. She has moderate, but non-flow limiting disease in the  right coronary and left circumflex.  PLAN: Percutaneous intervention of the distal LAD, see below.  PTCA PROCEDURAL NOTE: Following the completion of the diagnostic  catheterization,  we opted to proceed with percutaneous intervention. The preexisting 6-French  sheath in the right femoral artery was exchanged over a wire for a 7-French  sheath.  Integrilin and heparin were administered per protocol. We used a 7-French JL-4   guiding catheter and a BMW wire. The reference vessel was approximately 2 mm  in  diameter. We utilized a 2.0 x 20 mm CrossSail balloon, which was inflated  initially to 12 atm x 2 minutes and then 14 atm x 3 minutes. Final  angiographic  images revealed significant improvement in the lumen with 25% residual stenosis  and  a possible very small non-flow limiting dissection. There was TIMI-3 flow into  the  distal vessel.  COMPLICATIONS: None.  RESULTS: Successful percutaneous transluminal coronary angioplasty of the  distal  left anterior descending artery, reducing an 80% stenosis to 25% residual with  TIMI-3 flow.  PLAN: Integrilin will be continued for 20 hours. The patient needs aggressive  blood pressure control and risk factor reduction.  In regards to her peripheral vascular disease, she will be referred for further  evaluation and possible intervention.  03/2012 Echo LVEF 60-65%, no significant abnormalities   02/19/16 Clinic EKG (performed and reviewed in clinic): sinus bradycardia, no ischemic changes   02/2018 coronary CTA/FFR FINDINGS: FFR < 0.5 distal RCA suggesting severe mid RCA stenosis.  FFR 0.83 mid LAD suggesting non-hemodynamically significant mid LAD stenosis.  FFR 0.66 distal LAD suggesting significant distal LAD stenosis (versus cumulative effect of diffuse disease in LAD).  FFR 0.67 distal LCx suggesting significant stenosis mid to distal LCx (versus cumulative effect of diffuse disease in LCx).  IMPRESSION: 1. Suspect severe mid RCA stenosis.  2. FFR significantly low in distal LAD and distal LCx. This could suggest a single hemodynamically significant stenosis versus the cumulative effect of a long area of disease.  Suggest cardiac cath.  03/2018 cath  Dist RCA lesion is 75% stenosed.  Mid RCA lesion is 99% stenosed.  Prox RCA to Mid RCA lesion is 60% stenosed.  Drug-eluting stent was successfully placed:  STENT SYNERGY DES 3.5X38., 4.0 x 28 mm and 4.0 x 12 mm.  Post intervention, there is a 0% residual stenosis.  Ost 1st Mrg lesion is 50% stenosed.  1st Mrg lesion is 75% stenosed. This was a small vessel.  Mid Cx lesion is 60% stenosed. THis was a fairly small vessel.  Mid LAD lesion is 60% stenosed. THis was not significant by CT FFR.  Prox LAD to Mid LAD lesion is 25% stenosed.  2nd Diag lesion is 75% stenosed.  The left ventricular systolic function is normal.  LV end diastolic pressure is normal.  There is no aortic valve stenosis.  Recommend uninterrupted dual antiplatelet therapy with Aspirin 58m daily and Clopidogrel 731mdailyfor a minimum of  12 months (ACS - Class I recommendation).  Labs/Other Tests and Data Reviewed:    EKG: n/a  Recent Labs: No results found for requested labs within last 8760 hours.   Recent Lipid Panel Lab Results  Component Value Date/Time   CHOL 99 06/18/2016 06:41 AM   TRIG 169 (H) 06/18/2016 06:41 AM   HDL 35 (L) 06/18/2016 06:41 AM   CHOLHDL 2.8 06/18/2016 06:41 AM   LDLCALC 30 06/18/2016 06:41 AM    Wt Readings from Last 3 Encounters:  10/31/18 173 lb 9.6 oz (78.7 kg)  07/05/18 169 lb (76.7 kg)  06/27/18 171 lb (77.6 kg)     Objective:    Vital Signs:   Today's Vitals   04/18/19 0918  BP: (!) 116/58  Pulse: 68  Weight: 177 lb (80.3 kg)  Height: '5\' 5"'  (1.651 m)   Body mass index is 29.45 kg/m.  Normal affect. Normal speech pattern and tone. Comfortable, no apparent distress. No audible signs of SOB or wheezing.   ASSESSMENT & PLAN:    1.,CAD - no recent symptoms - she has completed a year of DAPT since her last stents. We discussed possibly extending plavix given her stent burden however due to heavy bruising and bleeding with her insulin checks she is not in favor. We will stop plavix.   2. HTN - at goal. Reports side effects on labetalol 252m bid, lower to 100 mg bid.   3. Hyperlipidemia - intolerant  to statins. Repeat lipid panel, consider zetia vs repatha pending results   4. PAD - no symptoms, continue medical therapy  COVID-19 Education: The signs and symptoms of COVID-19 were discussed with the patient and how to seek care for testing (follow up with PCP or arrange E-visit).  The importance of social distancing was discussed today.  Time:   Today, I have spent 23 minutes with the patient with telehealth technology discussing the above problems.     Medication Adjustments/Labs and Tests Ordered: Current medicines are reviewed at length with the patient today.  Concerns regarding medicines are outlined above.   Tests Ordered: No orders of the defined types were placed in this encounter.   Medication Changes: No orders of the defined types were placed in this encounter.   Follow Up:  In Person in 4 month(s)  Signed, BCarlyle Dolly MD  04/18/2019 8:17 AM    [place]

## 2019-05-15 ENCOUNTER — Telehealth: Payer: Self-pay | Admitting: *Deleted

## 2019-05-15 DIAGNOSIS — E782 Mixed hyperlipidemia: Secondary | ICD-10-CM

## 2019-05-15 NOTE — Telephone Encounter (Signed)
-----   Message from [name], MD sent at 05/10/2019  3:46 PM EDT ----- Labs received. CHolesterol too high, has not been able to tolerate statins, please refer to lipid clinic to consider pcsk9 inhibitor. Diabetes not well controlled, please forward results to pcp   Zandra Abts MD

## 2019-05-15 NOTE — Telephone Encounter (Signed)
Pt agreeable to lipid clinic - routed to pcp

## 2019-05-18 ENCOUNTER — Encounter: Payer: Self-pay | Admitting: *Deleted

## 2019-05-18 ENCOUNTER — Telehealth: Payer: Self-pay | Admitting: Cardiology

## 2019-05-18 NOTE — Telephone Encounter (Signed)
Results not scanned into chart yet. Advised that we can send her the results once they are scanned into chart. Verbalized understanding.

## 2019-05-18 NOTE — Telephone Encounter (Signed)
Patient called stating that she needs to have copies and results of recent labs.

## 2019-06-05 ENCOUNTER — Encounter: Payer: Self-pay | Admitting: *Deleted

## 2019-06-05 ENCOUNTER — Other Ambulatory Visit: Payer: Self-pay

## 2019-06-05 ENCOUNTER — Ambulatory Visit (INDEPENDENT_AMBULATORY_CARE_PROVIDER_SITE_OTHER): Payer: Medicare Other | Admitting: Pharmacist

## 2019-06-05 VITALS — BP 150/68 | HR 60 | Ht 65.0 in | Wt 174.6 lb

## 2019-06-05 DIAGNOSIS — E782 Mixed hyperlipidemia: Secondary | ICD-10-CM | POA: Diagnosis not present

## 2019-06-05 MED ORDER — VASCEPA 1 G PO CAPS: 1.0000 | ORAL_CAPSULE | Freq: Two times a day (BID) | ORAL | 1 refills | Status: DC

## 2019-06-05 MED ORDER — HYDRALAZINE HCL 25 MG PO TABS: 12.5000 mg | ORAL_TABLET | Freq: Three times a day (TID) | ORAL | 2 refills | Status: DC

## 2019-06-05 NOTE — Progress Notes (Signed)
Patient ID: [name] [name]                 DOB: [birth year]                    MRN: [id number]     HPI: [name] [name] is a 78 y.o. female patient referred to lipid clinic by Dr Harl Bowie. PMH is significant for diabetes with A1C of 7.8%, hypertension, hyperlipidemia, PAD s/p iliac stent placement in July 2201, and CAD s/p MI in 1997, angioplasty, and DES x 3.   Current Medications: none  Intolerances:  Rosuvastatin  - muscle cramp Atorvastatin - muscle cramp  LDL goal: < 70mg /dL (50mg /dL ideal)  Diet: working on low fat and low carbohydrate diet with PCP  Exercise: activities of daily living  Family History: family history includes COPD in her brother; Diabetes in her mother; Heart failure in her father and mother.  Social History: former smoker, denies alcohol inatke  Labs: 04/30/2019: CHO 250; TG 285, HDL 43, LDL-c 150; A1C 7.8%   Past Medical History:  Diagnosis Date  . Anxiety   . Arthritis    "neck" (03/15/2018)  . CAD (coronary artery disease)    Status post POBA distal LAD, status post Cardiolite Myoview 2008 negative for ischemia  . COPD (chronic obstructive pulmonary disease) ([place])   . Depression   . GERD (gastroesophageal reflux disease)   . Hypercholesterolemia   . Hypertension   . Peripheral vascular disease ([place])     status post iliac stent July 2001   . Type II diabetes mellitus ([place])     Current Outpatient Medications on File Prior to Visit  Medication Sig Dispense Refill  . amLODipine (NORVASC) 5 MG tablet TAKE (1) TABLET BY MOUTH ONCE DAILY. 90 tablet 2  . aspirin 81 MG tablet Take 81 mg by mouth at bedtime.     . betamethasone dipropionate (DIPROLENE) 0.05 % cream Apply 1 application topically 2 (two) times daily.    . bimatoprost (LUMIGAN) 0.01 % SOLN Place 1 drop into both eyes at bedtime.    . chlorthalidone (HYGROTON) 25 MG tablet TAKE 1/2 TABLET (12.5MG ) 2 TIMES A WEEK, ON [place]....Marland KitchenDIRECTION CHANGE. 12 tablet 3  . insulin glargine  (LANTUS) 100 UNIT/ML injection Inject 12-42 Units into the skin See admin instructions. Inject 12 units SQ every morning and inject 42 units SQ every evening around 1600    . insulin lispro (HUMALOG) 100 UNIT/ML injection Inject 16 Units into the skin 3 (three) times daily with meals.     [name] labetalol (NORMODYNE) 100 MG tablet Take 1 tablet (100 mg total) by mouth 2 (two) times daily. (Patient taking differently: Take 50 mg by mouth 2 (two) times daily. ) 180 tablet 1  . magnesium oxide (MAG-OX) 400 MG tablet Take 200 mg by mouth daily.    . metFORMIN (GLUCOPHAGE) 1000 MG tablet Take 1,000 mg by mouth daily with breakfast. As needed    . oxyCODONE-acetaminophen (PERCOCET) 10-325 MG tablet Take 1 tablet by mouth 2 (two) times daily.    . pantoprazole (PROTONIX) 40 MG tablet Take 1 tablet (40 mg total) by mouth daily as needed (indigestion/heartburn). 90 tablet 0  . SYMBICORT 160-4.5 MCG/ACT inhaler Inhale 2 puffs into the lungs 2 (two) times daily.     . nitroGLYCERIN (NITROSTAT) 0.4 MG SL tablet Place 1 tablet (0.4 mg total) under the tongue every 5 (five) minutes as needed for chest pain. (Patient not taking: Reported on 06/05/2019)  25 tablet 12   No current facility-administered medications on file prior to visit.     Allergies  Allergen Reactions  . Clonidine Derivatives Other (See Comments)    dizziness  . Crestor [Rosuvastatin]     Muscle cramps  . Lisinopril Other (See Comments)    Tongue swelling  . Losartan Other (See Comments)    TONGUE SWELLING  . Codeine Nausea And Vomiting  . Hydrocodone Nausea And Vomiting    Hyperlipidemia Patient currently working on positive lifestyle modifications with PCP. Significant improvement noted in A1C but TG and LDL remains above goal for secondary prevention. Noted intolerance to rosuvastatin, and atorvastatin in the past. Patient currently NOT on any medication for lipid management.   PCSK9i therapy discussed. Patient conformable with injectable  medication. Prior-authorization process, administration, storage,m common side effects, and patient assistance programs discussed during this visit.  Will initiate Vascepa 1gm BID and Praleunt 75mg  every 14 days (1st dose provided as sample for in office self administration; LOT [id number], Exp 11-04-2019) . Plan to repeat fasting blood work 2-3 months after initiating new therapy.   [name] [name] PharmD, BCPS, [place] [id number] 06/12/2019 8:01 AM

## 2019-06-05 NOTE — Patient Instructions (Addendum)
[facility]id Clinic ([facility]) [phone] Zurri Rudden/Kristin  *START taking Vascepa 1gm twice daily* * INCREASE hydralazine to 12.5mg  three times per day* *Will start paperwork for Praleunt 75mg  every 14 days*   High Cholesterol  High cholesterol is a condition in which the blood has high levels of a white, waxy, fat-like substance (cholesterol). The human body needs small amounts of cholesterol. The liver makes all the cholesterol that the body needs. Extra (excess) cholesterol comes from the food that we eat. Cholesterol is carried from the liver by the blood through the blood vessels. If you have high cholesterol, deposits (plaques) may build up on the walls of your blood vessels (arteries). Plaques make the arteries narrower and stiffer. Cholesterol plaques increase your risk for heart attack and stroke. Work with your health care provider to keep your cholesterol levels in a healthy range. What increases the risk? This condition is more likely to develop in people who:  Eat foods that are high in animal fat (saturated fat) or cholesterol.  Are overweight.  Are not getting enough exercise.  Have a family history of high cholesterol. What are the signs or symptoms? There are no symptoms of this condition. How is this diagnosed? This condition may be diagnosed from the results of a blood test.  If you are older than age 73, your health care provider may check your cholesterol every 4-6 years.  You may be checked more often if you already have high cholesterol or other risk factors for heart disease. The blood test for cholesterol measures:  "Bad" cholesterol (LDL cholesterol). This is the main type of cholesterol that causes heart disease. The desired level for LDL is less than 100.  "Good" cholesterol (HDL cholesterol). This type helps to protect against heart disease by cleaning the arteries and carrying the LDL away. The desired level for HDL is 60 or higher.  Triglycerides. These  are fats that the body can store or burn for energy. The desired number for triglycerides is lower than 150.  Total cholesterol. This is a measure of the total amount of cholesterol in your blood, including LDL cholesterol, HDL cholesterol, and triglycerides. A healthy number is less than 200. How is this treated? This condition is treated with diet changes, lifestyle changes, and medicines. Diet changes  This may include eating more whole grains, fruits, vegetables, nuts, and fish.  This may also include cutting back on red meat and foods that have a lot of added sugar. Lifestyle changes  Changes may include getting at least 40 minutes of aerobic exercise 3 times a week. Aerobic exercises include walking, biking, and swimming. Aerobic exercise along with a healthy diet can help you maintain a healthy weight.  Changes may also include quitting smoking. Medicines  Medicines are usually given if diet and lifestyle changes have failed to reduce your cholesterol to healthy levels.  Your health care provider may prescribe a statin medicine. Statin medicines have been shown to reduce cholesterol, which can reduce the risk of heart disease. Follow these instructions at home: Eating and drinking If told by your health care provider:  Eat chicken (without skin), fish, veal, shellfish, ground [place] breast, and round or loin cuts of red meat.  Do not eat fried foods or fatty meats, such as hot dogs and salami.  Eat plenty of fruits, such as apples.  Eat plenty of vegetables, such as broccoli, potatoes, and carrots.  Eat beans, peas, and lentils.  Eat grains such as barley, rice, couscous, and bulgur wheat.  Eat pasta without cream sauces.  Use skim or nonfat milk, and eat low-fat or nonfat yogurt and cheeses.  Do not eat or drink whole milk, cream, ice cream, egg yolks, or hard cheeses.  Do not eat stick margarine or tub margarines that contain trans fats (also called partially  hydrogenated oils).  Do not eat saturated tropical oils, such as coconut oil and palm oil.  Do not eat cakes, cookies, crackers, or other baked goods that contain trans fats.  General instructions  Exercise as directed by your health care provider. Increase your activity level with activities such as gardening, walking, and taking the stairs.  Take over-the-counter and prescription medicines only as told by your health care provider.  Do not use any products that contain nicotine or tobacco, such as cigarettes and e-cigarettes. If you need help quitting, ask your health care provider.  Keep all follow-up visits as told by your health care provider. This is important. Contact a health care provider if:  You are struggling to maintain a healthy diet or weight.  You need help to start on an exercise program.  You need help to stop smoking. Get help right away if:  You have chest pain.  You have trouble breathing. This information is not intended to replace advice given to you by your health care provider. Make sure you discuss any questions you have with your health care provider. Document Released: 09/20/2005 Document Revised: 09/23/2017 Document Reviewed: 03/20/2016 Elsevier Patient Education  2020 [employer].

## 2019-06-12 ENCOUNTER — Encounter: Payer: Self-pay | Admitting: Pharmacist

## 2019-06-12 NOTE — Assessment & Plan Note (Addendum)
Patient currently working on positive lifestyle modifications with PCP. Significant improvement noted in A1C but TG and LDL remains above goal for secondary prevention. Noted intolerance to rosuvastatin, and atorvastatin in the past. Patient currently NOT on any medication for lipid management.   PCSK9i therapy discussed. Patient conformable with injectable medication. Prior-authorization process, administration, storage,m common side effects, and patient assistance programs discussed during this visit.  Will initiate Vascepa 1gm BID and Praleunt 75mg  every 14 days (1st dose provided as sample for in office self administration; LOT [id number], Exp 11-04-2019) . Plan to repeat fasting blood work 2-3 months after initiating new therapy.

## 2019-06-20 ENCOUNTER — Telehealth: Payer: Self-pay | Admitting: Pharmacist

## 2019-06-20 NOTE — Telephone Encounter (Signed)
Patient called to report possible ADR to Praluent 75mg . Experiencing pain on her feet "feels time blisters on my feet" but no skin lesions, rash, or redness noted.  Praleunt 75mg  injection was given 14 days ago.  Noted was sent to PCP. Current symptom not related to Praluent given 14 days ago. Will need assessment for pain.

## 2019-06-28 ENCOUNTER — Other Ambulatory Visit: Payer: Self-pay | Admitting: *Deleted

## 2019-06-28 DIAGNOSIS — Z20822 Contact with and (suspected) exposure to covid-19: Secondary | ICD-10-CM

## 2019-06-29 ENCOUNTER — Telehealth: Payer: Self-pay | Admitting: *Deleted

## 2019-06-29 LAB — NOVEL CORONAVIRUS, NAA: SARS-CoV-2, NAA: NOT DETECTED

## 2019-06-29 NOTE — Telephone Encounter (Signed)
Reports SBP ranging around 160/50's-60's since hydralazine dose was changed on 06/05/2019. Confirmed that she takes her BP everyday around 6:30-7:00 am. Confirmed that she is taking hydralazine 12.5 mg TID. Advised to add 12.5 mg to her morning dose, continue checking her BP and contact our office in 2 weeks with her readings. Verbalized understanding of plan.

## 2019-07-09 ENCOUNTER — Telehealth: Payer: Self-pay

## 2019-07-09 ENCOUNTER — Telehealth: Payer: Self-pay | Admitting: Cardiology

## 2019-07-09 MED ORDER — REPATHA SURECLICK 140 MG/ML ~~LOC~~ SOAJ: 140.0000 mg | SUBCUTANEOUS | 11 refills | Status: DC

## 2019-07-09 NOTE — Telephone Encounter (Signed)
Complain of feeling like her heart was pounding the next morning after receiving her Repatha injection.  States that she does not want to take that medication - made her feel like she was going to die.  Stated that she woke up at 5:00 am & heart was pounding. (195/81  HR - 86) Stated that her chest was hurting her a little bit - felt like pressure on her chest.  No dizziness.  Did note some SOB during that episode.  Got to feeling better around lunch time when she took her second mecicaton for her BP.

## 2019-07-09 NOTE — Telephone Encounter (Signed)
Patient complaining of ADR to Praluent 75mg  given on 06/05/2019 during OV.  She refuses to take Repatha or Praluent at this time. Will like to discuss with DR Harl Bowie  Therapeutic options with during follow up appointment in November

## 2019-07-09 NOTE — Telephone Encounter (Signed)
Called and spoke w/pt regarding the approval of repatha and rx sent.

## 2019-07-09 NOTE — Telephone Encounter (Signed)
Patient called stating that when she takes the repatha it is causing her heart to beat so hard.

## 2019-08-02 ENCOUNTER — Other Ambulatory Visit: Payer: Self-pay | Admitting: *Deleted

## 2019-08-02 MED ORDER — VASCEPA 1 G PO CAPS: 1.0000 | ORAL_CAPSULE | Freq: Two times a day (BID) | ORAL | 6 refills | Status: DC

## 2019-08-20 ENCOUNTER — Other Ambulatory Visit: Payer: Self-pay | Admitting: *Deleted

## 2019-08-20 ENCOUNTER — Other Ambulatory Visit: Payer: Self-pay | Admitting: Cardiology

## 2019-08-20 MED ORDER — HYDRALAZINE HCL 25 MG PO TABS: 12.5000 mg | ORAL_TABLET | Freq: Three times a day (TID) | ORAL | 3 refills | Status: DC

## 2019-08-24 ENCOUNTER — Other Ambulatory Visit: Payer: Self-pay

## 2019-08-24 ENCOUNTER — Encounter: Payer: Self-pay | Admitting: Cardiology

## 2019-08-24 ENCOUNTER — Ambulatory Visit (INDEPENDENT_AMBULATORY_CARE_PROVIDER_SITE_OTHER): Payer: Medicare Other | Admitting: Cardiology

## 2019-08-24 VITALS — BP 168/62 | HR 58 | Ht 65.0 in | Wt 174.0 lb

## 2019-08-24 DIAGNOSIS — I2510 Atherosclerotic heart disease of native coronary artery without angina pectoris: Secondary | ICD-10-CM

## 2019-08-24 DIAGNOSIS — E782 Mixed hyperlipidemia: Secondary | ICD-10-CM

## 2019-08-24 DIAGNOSIS — I739 Peripheral vascular disease, unspecified: Secondary | ICD-10-CM

## 2019-08-24 DIAGNOSIS — I10 Essential (primary) hypertension: Secondary | ICD-10-CM | POA: Diagnosis not present

## 2019-08-24 MED ORDER — EZETIMIBE 10 MG PO TABS: 10.0000 mg | ORAL_TABLET | Freq: Every day | ORAL | 1 refills | Status: DC

## 2019-08-24 NOTE — Patient Instructions (Signed)
Your physician wants you to follow-up in: [place] will receive a reminder letter in the mail two months in advance. If you don't receive a letter, please call our office to schedule the follow-up appointment.  Your physician has recommended you make the following change in your medication:   START ZETIA 10 MG DAILY   Thank you for choosing [facility]!!

## 2019-08-24 NOTE — Progress Notes (Signed)
Clinical Summary [name] [name] is a 78 y.o.female seen today for follow up of the following medical problems.  1. CAD - prior PTCA to distal LAD in 2001. 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  - echo 06/2012 LVEF 60-65% - DSE 06/2012 with reported ST elevation in inferior leads, negative echo images for ischemia.  -02/2018 coronary CTA suggests severe RCA disease, abnormal FFR in distal LAD and LCX as well. -03/2018 cath as reported below. Received DESx3to RCA. 75% OM1 small vessel managed medically   - no recent chest pain. No SOB/DOE   2. HTN - history of difficult to treat HTN, primarily due to reported medication side effects to several agents.  - history of angioedema on ACE-I and most recently on ARB.  - she reports amlodopine caused leg swelling(she had been on 39m). Clonidine caused dizziness.  - we had started chlrothalidone 531mdaily. She had some muscle cramping, and the dose was decreased to 2544maily, then 98m4md and then 12.5mg 33m.  - dizziness after starting hydralazine, pins and needles feelings in ears with laying down. We have tried continuing this medication. Same side effect reported on nitrates, discontinued.  - she reports alopecia possibly due to aldactone    - home bp's at home 140s/50s.     3. PAD - prior iliac stent in July 2001 - [place] 9/[place] Right ABI 0.99 Left ABI 0.99, with patent left iliac stent.  - no recent claudication pain   4. Hyperlipidemia - intolerant to statins  - she reports side effects on pcsk9 inhibitor, caused signicant palpitations  04/2019 labs show LDL 150     Past Medical History:  Diagnosis Date  . Anxiety   . Arthritis    "neck" (03/15/2018)  . CAD (coronary artery disease)    Status post POBA distal LAD, status post Cardiolite Myoview 2008 negative for ischemia  . COPD (chronic obstructive pulmonary disease) (HCC) [place]  Depression   . GERD (gastroesophageal reflux disease)   . Hypercholesterolemia   . Hypertension   . Peripheral vascular disease (HCC) [place] status post iliac stent July 2001   . Type II diabetes mellitus (HCC)      Allergies  Allergen Reactions  . Clonidine Derivatives Other (See Comments)    dizziness  . Crestor [Rosuvastatin]     Muscle cramps  . Lisinopril Other (See Comments)    Tongue swelling  . Losartan Other (See Comments)    TONGUE SWELLING  . Codeine Nausea And Vomiting  . Hydrocodone Nausea And Vomiting     Current Outpatient Medications  Medication Sig Dispense Refill  . amLODipine (NORVASC) 5 MG tablet TAKE (1) TABLET BY MOUTH ONCE DAILY. 90 tablet 2  . aspirin 81 MG tablet Take 81 mg by mouth at bedtime.     . betamethasone dipropionate (DIPROLENE) 0.05 % cream Apply 1 application topically 2 (two) times daily.    . bimatoprost (LUMIGAN) 0.01 % SOLN Place 1 drop into both eyes at bedtime.    . chlorthalidone (HYGROTON) 25 MG tablet TAKE 1/2 TABLET (12.5MG) 2 TIMES A WEEK, ON MONDACarlsbadDIREMarland KitchenTION CHANGE. 12 tablet 3  . Evolocumab (REPATHA SURECLICK) 140 M875L SOAJ Inject 140 mg into the skin every 14 (fourteen) days. 2 pen 11  . hydrALAZINE (APRESOLINE) 25 MG tablet Take 0.5 tablets (12.5 mg total) by mouth 3 (three) times daily. Dose changed 06/05/2019.  May take an additional  62m as needed for high BP's. 135 tablet 3  . Icosapent Ethyl (VASCEPA) 1 g CAPS Take 1 capsule (1 g total) by mouth 2 (two) times daily. 60 capsule 6  . insulin glargine (LANTUS) 100 UNIT/ML injection Inject 12-42 Units into the skin See admin instructions. Inject 12 units SQ every morning and inject 42 units SQ every evening around 1600    . insulin lispro (HUMALOG) 100 UNIT/ML injection Inject 16 Units into the skin 3 (three) times daily with meals.     .Marland Kitchenlabetalol (NORMODYNE) 100 MG tablet Take 1 tablet (100 mg total) by mouth 2 (two) times daily. (Patient taking differently: Take 50  mg by mouth 2 (two) times daily. ) 180 tablet 1  . magnesium oxide (MAG-OX) 400 MG tablet Take 200 mg by mouth daily.    . metFORMIN (GLUCOPHAGE) 1000 MG tablet Take 1,000 mg by mouth daily with breakfast. As needed    . nitroGLYCERIN (NITROSTAT) 0.4 MG SL tablet Place 1 tablet (0.4 mg total) under the tongue every 5 (five) minutes as needed for chest pain. (Patient not taking: Reported on 06/05/2019) 25 tablet 12  . oxyCODONE-acetaminophen (PERCOCET) 10-325 MG tablet Take 1 tablet by mouth 2 (two) times daily.    . pantoprazole (PROTONIX) 40 MG tablet Take 1 tablet (40 mg total) by mouth daily as needed (indigestion/heartburn). 90 tablet 0  . SYMBICORT 160-4.5 MCG/ACT inhaler Inhale 2 puffs into the lungs 2 (two) times daily.      No current facility-administered medications for this visit.      Past Surgical History:  Procedure Laterality Date  . ANTERIOR CERVICAL DECOMP/DISCECTOMY FUSION  2001  . BACK SURGERY    . CATARACT EXTRACTION, BILATERAL Bilateral   . CHOLECYSTECTOMY OPEN    . CORONARY ANGIOPLASTY     POBA distal LAD,  . CORONARY STENT INTERVENTION N/A 03/15/2018   Procedure: CORONARY STENT INTERVENTION;  Surgeon: VJettie Booze MD;  [facility];  Service: Cardiovascular;  Laterality: N/A;  . DILATION AND CURETTAGE OF UTERUS  1960s  . EYE SURGERY Bilateral    "lasers"  . LEFT HEART CATH AND CORONARY ANGIOGRAPHY N/A 03/15/2018   Procedure: LEFT HEART CATH AND CORONARY ANGIOGRAPHY;  Surgeon: VJettie Booze MD;  [facility];  Service: Cardiovascular;  Laterality: N/A;  . PERIPHERAL VASCULAR INTERVENTION Right 04/2000    iliac stent   . TUBAL LIGATION       Allergies  Allergen Reactions  . Clonidine Derivatives Other (See Comments)    dizziness  . Crestor [Rosuvastatin]     Muscle cramps  . Lisinopril Other (See Comments)    Tongue swelling  . Losartan Other (See Comments)    TONGUE SWELLING  . Codeine Nausea And Vomiting   . Hydrocodone Nausea And Vomiting      Family History  Problem Relation Age of Onset  . Heart failure Father   . Heart failure Mother   . Diabetes Mother   . COPD Brother      Social History Ms. CGoodlinreports that she quit smoking about 23 years ago. Her smoking use included cigarettes. She started smoking about 57 years ago. She has a 20.00 pack-year smoking history. She has never used smokeless tobacco. Ms. CBaysreports no history of alcohol use.   Review of Systems CONSTITUTIONAL: No weight loss, fever, chills, weakness or fatigue.  HEENT: Eyes: No visual loss, blurred vision, double vision or yellow sclerae.No hearing loss, sneezing, congestion, runny nose  or sore throat.  SKIN: No rash or itching.  CARDIOVASCULAR: per hpi RESPIRATORY: No shortness of breath, cough or sputum.  GASTROINTESTINAL: No anorexia, nausea, vomiting or diarrhea. No abdominal pain or blood.  GENITOURINARY: No burning on urination, no polyuria NEUROLOGICAL: No headache, dizziness, syncope, paralysis, ataxia, numbness or tingling in the extremities. No change in bowel or bladder control.  MUSCULOSKELETAL: No muscle, back pain, joint pain or stiffness.  LYMPHATICS: No enlarged nodes. No history of splenectomy.  PSYCHIATRIC: No history of depression or anxiety.  ENDOCRINOLOGIC: No reports of sweating, cold or heat intolerance. No polyuria or polydipsia.  [name]   Physical Examination Today's Vitals   08/24/19 1106  BP: (!) 168/62  Pulse: (!) 58  SpO2: 97%  Weight: 174 lb (78.9 kg)  Height: _0  (1.651 m)   Body mass index is 28.96 kg/m.  Gen: resting comfortably, no acute distress HEENT: no scleral icterus, pupils equal round and reactive, no palptable cervical adenopathy,  CV: RRR, no m/r/g, no jvd Resp: Clear to auscultation bilaterally GI: abdomen is soft, non-tender, non-distended, normal bowel sounds, no hepatosplenomegaly MSK: extremities are warm, no edema.  Skin: warm, no rash Neuro:   no focal deficits Psych: appropriate affect   Diagnostic Studies  11/1999 Cath HEMODYNAMIC DATA: Aortic pressure was initially extremely high at 215/92. She  was  given intravenous enalaprilat, intravenous labetalol, and intravenous  nitroglycerin, ultimately bringing her systolic blood pressure down to less  than  160. Left ventricular pressure was 180/17, with a corresponding aortic  pressure of  180/80. There was no aortic valve gradient.  LEFT VENTRICULOGRAM: Wall motion was normal. Ejection fraction was estimated  t  65%. No mitral regurgitation.  ABDOMINAL AORTOGRAM: This revealed patent renal arteries and abdominal aorta.  The  left common iliac artery had a 75% stenosis. The right external iliac artery  had a  40% stenosis.  CORONARY ARTERIOGRAPHY (Right dominant):  1. The left main was normal.  2. The left anterior descending artery had a 25% stenosis in the proximal to  mid  vessel. The distal LAD had a 50% stenosis just after the bifurcation of the  large second diagonal [name] [name]. Further down the distal LAD is a focal 80%  stenosis.  3. The left circumflex had a 20% stenosis in the mid vessel. It gave rise to a  small OM-1, a normal sized OM-2 which had a 25% stenosis, and a normal sized  OM-3 which had a 20% stenosis.  4. The right coronary artery had a diffuse 40% stenosis extending from the mid  o  distal vessel. There was a large posterior descending artery which had a  25%,  followed by a 40% stenosis. There were two small posterolateral branches.  IMPRESSIONS:  1. Normal left ventricular systolic function.  2. Peripheral vascular disease, as described.  3. One-vessel coronary artery disease with significant stenosis in the distal  left  anterior descending artery. This appears to correspond with her EKG changes  and  Cardiolite scan. She has moderate, but non-flow limiting disease in the  right coronary and left  circumflex.  PLAN: Percutaneous intervention of the distal LAD, see below.  PTCA PROCEDURAL NOTE: Following the completion of the diagnostic  catheterization,  we opted to proceed with percutaneous intervention. The preexisting 6-French  sheath in the right femoral artery was exchanged over a wire for a 7-French  sheath.  Integrilin and heparin were administered per protocol. We used a 7-French JL-4  guiding catheter and a BMW  wire. The reference vessel was approximately 2 mm  in  diameter. We utilized a 2.0 x 20 mm CrossSail balloon, which was inflated  initially to 12 atm x 2 minutes and then 14 atm x 3 minutes. Final  angiographic  images revealed significant improvement in the lumen with 25% residual stenosis  and  a possible very small non-flow limiting dissection. There was TIMI-3 flow into  the  distal vessel.  COMPLICATIONS: None.  RESULTS: Successful percutaneous transluminal coronary angioplasty of the  distal  left anterior descending artery, reducing an 80% stenosis to 25% residual with  TIMI-3 flow.  PLAN: Integrilin will be continued for 20 hours. The patient needs aggressive  blood pressure control and risk factor reduction.  In regards to her peripheral vascular disease, she will be referred for further  evaluation and possible intervention.  03/2012 Echo LVEF 60-65%, no significant abnormalities   02/19/16 Clinic EKG (performed and reviewed in clinic): sinus bradycardia, no ischemic changes   02/2018 coronary CTA/FFR FINDINGS: FFR < 0.5 distal RCA suggesting severe mid RCA stenosis.  FFR 0.83 mid LAD suggesting non-hemodynamically significant mid LAD stenosis.  FFR 0.66 distal LAD suggesting significant distal LAD stenosis (versus cumulative effect of diffuse disease in LAD).  FFR 0.67 distal LCx suggesting significant stenosis mid to distal LCx (versus cumulative effect of diffuse disease in LCx).  IMPRESSION: 1.  Suspect severe mid RCA stenosis.  2. FFR significantly low in distal LAD and distal LCx. This could suggest a single hemodynamically significant stenosis versus the cumulative effect of a long area of disease.  Suggest cardiac cath.  03/2018 cath  Dist RCA lesion is 75% stenosed.  Mid RCA lesion is 99% stenosed.  Prox RCA to Mid RCA lesion is 60% stenosed.  Drug-eluting stent was successfully placed: STENT SYNERGY DES 3.5X38., 4.0 x 28 mm and 4.0 x 12 mm.  Post intervention, there is a 0% residual stenosis.  Ost 1st Mrg lesion is 50% stenosed.  1st Mrg lesion is 75% stenosed. This was a small vessel.  Mid Cx lesion is 60% stenosed. THis was a fairly small vessel.  Mid LAD lesion is 60% stenosed. THis was not significant by CT FFR.  Prox LAD to Mid LAD lesion is 25% stenosed.  2nd Diag lesion is 75% stenosed.  The left ventricular systolic function is normal.  LV end diastolic pressure is normal.  There is no aortic valve stenosis.   Assessment and Plan   1.,CAD - no recent symptoms, continue current meds  2. HTN -home numbers mildly elevated, her clinic numbers tend to be even higher due to some white coat HTN - due to extensive medication side effects as listed above will tolerate her home SBPs in the 140s at this time  3.Hyperlipidemia - intolerant to statins. Reported side effects on pcsk9 inhibitor - will try zetia 10 mg daily, only remaining option. Will not get her LDL to goal but if tolerated will bring down some.    4. PAD -no recent symptoms, continue current meds   [name], M.D.,

## 2019-09-05 ENCOUNTER — Other Ambulatory Visit: Payer: Self-pay | Admitting: Family Medicine

## 2019-09-05 DIAGNOSIS — N6489 Other specified disorders of breast: Secondary | ICD-10-CM

## 2019-09-11 ENCOUNTER — Ambulatory Visit
Admission: RE | Admit: 2019-09-11 | Discharge: 2019-09-11 | Disposition: A | Discharge status: Home / Self Care | Payer: Medicare Other | Source: Ambulatory Visit | Attending: Family Medicine | Admitting: Family Medicine

## 2019-09-11 ENCOUNTER — Other Ambulatory Visit: Payer: Self-pay

## 2019-09-11 DIAGNOSIS — N6489 Other specified disorders of breast: Secondary | ICD-10-CM

## 2019-10-04 ENCOUNTER — Other Ambulatory Visit: Payer: Self-pay | Admitting: Cardiology

## 2019-10-12 DIAGNOSIS — R0789 Other chest pain: Secondary | ICD-10-CM | POA: Diagnosis not present

## 2019-10-30 DIAGNOSIS — I10 Essential (primary) hypertension: Secondary | ICD-10-CM | POA: Diagnosis not present

## 2019-11-02 DIAGNOSIS — S2239XD Fracture of one rib, unspecified side, subsequent encounter for fracture with routine healing: Secondary | ICD-10-CM | POA: Diagnosis not present

## 2019-11-02 DIAGNOSIS — Z299 Encounter for prophylactic measures, unspecified: Secondary | ICD-10-CM | POA: Diagnosis not present

## 2019-11-02 DIAGNOSIS — E1142 Type 2 diabetes mellitus with diabetic polyneuropathy: Secondary | ICD-10-CM | POA: Diagnosis not present

## 2019-11-02 DIAGNOSIS — E1165 Type 2 diabetes mellitus with hyperglycemia: Secondary | ICD-10-CM | POA: Diagnosis not present

## 2019-11-02 DIAGNOSIS — I10 Essential (primary) hypertension: Secondary | ICD-10-CM | POA: Diagnosis not present

## 2019-11-02 DIAGNOSIS — I739 Peripheral vascular disease, unspecified: Secondary | ICD-10-CM | POA: Diagnosis not present

## 2019-11-12 DIAGNOSIS — I739 Peripheral vascular disease, unspecified: Secondary | ICD-10-CM | POA: Diagnosis not present

## 2019-11-12 DIAGNOSIS — E7800 Pure hypercholesterolemia, unspecified: Secondary | ICD-10-CM | POA: Diagnosis not present

## 2019-11-12 DIAGNOSIS — E1122 Type 2 diabetes mellitus with diabetic chronic kidney disease: Secondary | ICD-10-CM | POA: Diagnosis not present

## 2019-11-12 DIAGNOSIS — N62 Hypertrophy of breast: Secondary | ICD-10-CM | POA: Diagnosis not present

## 2019-11-12 DIAGNOSIS — N6489 Other specified disorders of breast: Secondary | ICD-10-CM | POA: Diagnosis not present

## 2019-11-12 DIAGNOSIS — G4730 Sleep apnea, unspecified: Secondary | ICD-10-CM | POA: Diagnosis not present

## 2019-11-12 DIAGNOSIS — Z01818 Encounter for other preprocedural examination: Secondary | ICD-10-CM | POA: Diagnosis not present

## 2019-11-12 DIAGNOSIS — I2510 Atherosclerotic heart disease of native coronary artery without angina pectoris: Secondary | ICD-10-CM | POA: Diagnosis not present

## 2019-11-12 DIAGNOSIS — D242 Benign neoplasm of left breast: Secondary | ICD-10-CM | POA: Diagnosis not present

## 2019-11-12 DIAGNOSIS — J449 Chronic obstructive pulmonary disease, unspecified: Secondary | ICD-10-CM | POA: Diagnosis not present

## 2019-11-12 DIAGNOSIS — N189 Chronic kidney disease, unspecified: Secondary | ICD-10-CM | POA: Diagnosis not present

## 2019-11-12 DIAGNOSIS — L905 Scar conditions and fibrosis of skin: Secondary | ICD-10-CM | POA: Diagnosis not present

## 2019-11-12 DIAGNOSIS — I252 Old myocardial infarction: Secondary | ICD-10-CM | POA: Diagnosis not present

## 2019-11-12 DIAGNOSIS — K219 Gastro-esophageal reflux disease without esophagitis: Secondary | ICD-10-CM | POA: Diagnosis not present

## 2019-11-12 DIAGNOSIS — I129 Hypertensive chronic kidney disease with stage 1 through stage 4 chronic kidney disease, or unspecified chronic kidney disease: Secondary | ICD-10-CM | POA: Diagnosis not present

## 2019-11-14 DIAGNOSIS — R928 Other abnormal and inconclusive findings on diagnostic imaging of breast: Secondary | ICD-10-CM | POA: Diagnosis not present

## 2019-11-14 DIAGNOSIS — N189 Chronic kidney disease, unspecified: Secondary | ICD-10-CM | POA: Diagnosis not present

## 2019-11-14 DIAGNOSIS — I739 Peripheral vascular disease, unspecified: Secondary | ICD-10-CM | POA: Diagnosis not present

## 2019-11-14 DIAGNOSIS — I252 Old myocardial infarction: Secondary | ICD-10-CM | POA: Diagnosis not present

## 2019-11-14 DIAGNOSIS — E1122 Type 2 diabetes mellitus with diabetic chronic kidney disease: Secondary | ICD-10-CM | POA: Diagnosis not present

## 2019-11-14 DIAGNOSIS — G4730 Sleep apnea, unspecified: Secondary | ICD-10-CM | POA: Diagnosis not present

## 2019-11-14 DIAGNOSIS — L905 Scar conditions and fibrosis of skin: Secondary | ICD-10-CM | POA: Diagnosis not present

## 2019-11-14 DIAGNOSIS — I2510 Atherosclerotic heart disease of native coronary artery without angina pectoris: Secondary | ICD-10-CM | POA: Diagnosis not present

## 2019-11-14 DIAGNOSIS — N6489 Other specified disorders of breast: Secondary | ICD-10-CM | POA: Diagnosis not present

## 2019-11-14 DIAGNOSIS — K219 Gastro-esophageal reflux disease without esophagitis: Secondary | ICD-10-CM | POA: Diagnosis not present

## 2019-11-14 DIAGNOSIS — N6320 Unspecified lump in the left breast, unspecified quadrant: Secondary | ICD-10-CM | POA: Diagnosis not present

## 2019-11-14 DIAGNOSIS — I129 Hypertensive chronic kidney disease with stage 1 through stage 4 chronic kidney disease, or unspecified chronic kidney disease: Secondary | ICD-10-CM | POA: Diagnosis not present

## 2019-11-14 DIAGNOSIS — D242 Benign neoplasm of left breast: Secondary | ICD-10-CM | POA: Diagnosis not present

## 2019-11-14 DIAGNOSIS — E119 Type 2 diabetes mellitus without complications: Secondary | ICD-10-CM | POA: Diagnosis not present

## 2019-11-14 DIAGNOSIS — J449 Chronic obstructive pulmonary disease, unspecified: Secondary | ICD-10-CM | POA: Diagnosis not present

## 2019-11-14 DIAGNOSIS — N62 Hypertrophy of breast: Secondary | ICD-10-CM | POA: Diagnosis not present

## 2019-11-14 DIAGNOSIS — E7800 Pure hypercholesterolemia, unspecified: Secondary | ICD-10-CM | POA: Diagnosis not present

## 2019-12-02 DIAGNOSIS — I10 Essential (primary) hypertension: Secondary | ICD-10-CM | POA: Diagnosis not present

## 2019-12-03 DIAGNOSIS — Z79899 Other long term (current) drug therapy: Secondary | ICD-10-CM | POA: Diagnosis not present

## 2019-12-03 DIAGNOSIS — G8929 Other chronic pain: Secondary | ICD-10-CM | POA: Diagnosis not present

## 2019-12-03 DIAGNOSIS — E1165 Type 2 diabetes mellitus with hyperglycemia: Secondary | ICD-10-CM | POA: Diagnosis not present

## 2019-12-03 DIAGNOSIS — I10 Essential (primary) hypertension: Secondary | ICD-10-CM | POA: Diagnosis not present

## 2019-12-03 DIAGNOSIS — Z299 Encounter for prophylactic measures, unspecified: Secondary | ICD-10-CM | POA: Diagnosis not present

## 2019-12-03 DIAGNOSIS — M542 Cervicalgia: Secondary | ICD-10-CM | POA: Diagnosis not present

## 2020-01-01 DIAGNOSIS — I10 Essential (primary) hypertension: Secondary | ICD-10-CM | POA: Diagnosis not present

## 2020-01-02 DIAGNOSIS — E1165 Type 2 diabetes mellitus with hyperglycemia: Secondary | ICD-10-CM | POA: Diagnosis not present

## 2020-01-02 DIAGNOSIS — Z299 Encounter for prophylactic measures, unspecified: Secondary | ICD-10-CM | POA: Diagnosis not present

## 2020-01-02 DIAGNOSIS — M542 Cervicalgia: Secondary | ICD-10-CM | POA: Diagnosis not present

## 2020-01-02 DIAGNOSIS — Z87891 Personal history of nicotine dependence: Secondary | ICD-10-CM | POA: Diagnosis not present

## 2020-01-02 DIAGNOSIS — I10 Essential (primary) hypertension: Secondary | ICD-10-CM | POA: Diagnosis not present

## 2020-01-02 DIAGNOSIS — Z79899 Other long term (current) drug therapy: Secondary | ICD-10-CM | POA: Diagnosis not present

## 2020-01-02 DIAGNOSIS — E1129 Type 2 diabetes mellitus with other diabetic kidney complication: Secondary | ICD-10-CM | POA: Diagnosis not present

## 2020-01-02 DIAGNOSIS — G8929 Other chronic pain: Secondary | ICD-10-CM | POA: Diagnosis not present

## 2020-01-08 ENCOUNTER — Other Ambulatory Visit: Payer: Self-pay | Admitting: Cardiology

## 2020-01-21 ENCOUNTER — Other Ambulatory Visit: Payer: Self-pay | Admitting: Cardiology

## 2020-01-28 ENCOUNTER — Emergency Department (HOSPITAL_COMMUNITY): Payer: Medicare Other

## 2020-01-28 ENCOUNTER — Encounter (HOSPITAL_COMMUNITY): Payer: Self-pay

## 2020-01-28 ENCOUNTER — Other Ambulatory Visit: Payer: Self-pay

## 2020-01-28 ENCOUNTER — Observation Stay (HOSPITAL_COMMUNITY)
Admission: EM | Admit: 2020-01-28 | Discharge: 2020-01-29 | Disposition: A | Discharge status: Home / Self Care | Payer: Medicare Other | Attending: Internal Medicine | Admitting: Internal Medicine

## 2020-01-28 DIAGNOSIS — J449 Chronic obstructive pulmonary disease, unspecified: Secondary | ICD-10-CM | POA: Diagnosis not present

## 2020-01-28 DIAGNOSIS — I69334 Monoplegia of upper limb following cerebral infarction affecting left non-dominant side: Secondary | ICD-10-CM | POA: Diagnosis not present

## 2020-01-28 DIAGNOSIS — I639 Cerebral infarction, unspecified: Secondary | ICD-10-CM | POA: Diagnosis not present

## 2020-01-28 DIAGNOSIS — E11649 Type 2 diabetes mellitus with hypoglycemia without coma: Secondary | ICD-10-CM | POA: Diagnosis not present

## 2020-01-28 DIAGNOSIS — I10 Essential (primary) hypertension: Secondary | ICD-10-CM | POA: Insufficient documentation

## 2020-01-28 DIAGNOSIS — R0789 Other chest pain: Secondary | ICD-10-CM | POA: Insufficient documentation

## 2020-01-28 DIAGNOSIS — E162 Hypoglycemia, unspecified: Secondary | ICD-10-CM | POA: Diagnosis not present

## 2020-01-28 DIAGNOSIS — Z87891 Personal history of nicotine dependence: Secondary | ICD-10-CM | POA: Diagnosis not present

## 2020-01-28 DIAGNOSIS — E782 Mixed hyperlipidemia: Secondary | ICD-10-CM

## 2020-01-28 DIAGNOSIS — Z7982 Long term (current) use of aspirin: Secondary | ICD-10-CM | POA: Diagnosis not present

## 2020-01-28 DIAGNOSIS — E119 Type 2 diabetes mellitus without complications: Secondary | ICD-10-CM

## 2020-01-28 DIAGNOSIS — R079 Chest pain, unspecified: Secondary | ICD-10-CM | POA: Diagnosis not present

## 2020-01-28 DIAGNOSIS — R270 Ataxia, unspecified: Secondary | ICD-10-CM | POA: Diagnosis not present

## 2020-01-28 DIAGNOSIS — Z20822 Contact with and (suspected) exposure to covid-19: Secondary | ICD-10-CM | POA: Insufficient documentation

## 2020-01-28 DIAGNOSIS — I6523 Occlusion and stenosis of bilateral carotid arteries: Secondary | ICD-10-CM | POA: Diagnosis not present

## 2020-01-28 DIAGNOSIS — E785 Hyperlipidemia, unspecified: Secondary | ICD-10-CM | POA: Diagnosis not present

## 2020-01-28 DIAGNOSIS — R072 Precordial pain: Secondary | ICD-10-CM | POA: Diagnosis not present

## 2020-01-28 DIAGNOSIS — I2510 Atherosclerotic heart disease of native coronary artery without angina pectoris: Secondary | ICD-10-CM | POA: Diagnosis not present

## 2020-01-28 DIAGNOSIS — Z794 Long term (current) use of insulin: Secondary | ICD-10-CM | POA: Insufficient documentation

## 2020-01-28 DIAGNOSIS — R0689 Other abnormalities of breathing: Secondary | ICD-10-CM | POA: Diagnosis not present

## 2020-01-28 DIAGNOSIS — Z743 Need for continuous supervision: Secondary | ICD-10-CM | POA: Diagnosis not present

## 2020-01-28 LAB — URINALYSIS, ROUTINE W REFLEX MICROSCOPIC
Bilirubin Urine: NEGATIVE
Glucose, UA: NEGATIVE mg/dL
Hgb urine dipstick: NEGATIVE
Ketones, ur: NEGATIVE mg/dL
Leukocytes,Ua: NEGATIVE
Nitrite: NEGATIVE
Protein, ur: NEGATIVE mg/dL
Specific Gravity, Urine: 1.005 (ref 1.005–1.030)
pH: 8 (ref 5.0–8.0)

## 2020-01-28 LAB — DIFFERENTIAL
Abs Immature Granulocytes: 0.01 10*3/uL (ref 0.00–0.07)
Basophils Absolute: 0.1 10*3/uL (ref 0.0–0.1)
Basophils Relative: 1 %
Eosinophils Absolute: 0.3 10*3/uL (ref 0.0–0.5)
Eosinophils Relative: 5 %
Immature Granulocytes: 0 %
Lymphocytes Relative: 26 %
Lymphs Abs: 1.7 10*3/uL (ref 0.7–4.0)
Monocytes Absolute: 0.5 10*3/uL (ref 0.1–1.0)
Monocytes Relative: 8 %
Neutro Abs: 4.1 10*3/uL (ref 1.7–7.7)
Neutrophils Relative %: 60 %

## 2020-01-28 LAB — RAPID URINE DRUG SCREEN, HOSP PERFORMED
Amphetamines: NOT DETECTED
Barbiturates: NOT DETECTED
Benzodiazepines: NOT DETECTED
Cocaine: NOT DETECTED
Opiates: NOT DETECTED
Tetrahydrocannabinol: NOT DETECTED

## 2020-01-28 LAB — COMPREHENSIVE METABOLIC PANEL
ALT: 20 U/L (ref 0–44)
AST: 19 U/L (ref 15–41)
Albumin: 3.9 g/dL (ref 3.5–5.0)
Alkaline Phosphatase: 109 U/L (ref 38–126)
Anion gap: 10 (ref 5–15)
BUN: 11 mg/dL (ref 8–23)
CO2: 28 mmol/L (ref 22–32)
Calcium: 9.5 mg/dL (ref 8.9–10.3)
Chloride: 100 mmol/L (ref 98–111)
Creatinine, Ser: 0.63 mg/dL (ref 0.44–1.00)
GFR calc Af Amer: >60 mL/min (ref >60)
GFR calc non Af Amer: >60 mL/min (ref >60)
Glucose, Bld: 93 mg/dL (ref 70–99)
Potassium: 4 mmol/L (ref 3.5–5.1)
Sodium: 138 mmol/L (ref 135–145)
Total Bilirubin: 0.8 mg/dL (ref 0.3–1.2)
Total Protein: 6.9 g/dL (ref 6.5–8.1)

## 2020-01-28 LAB — CBC
HCT: 40.2 % (ref 36.0–46.0)
Hemoglobin: 13.4 g/dL (ref 12.0–15.0)
MCH: 29.8 pg (ref 26.0–34.0)
MCHC: 33.3 g/dL (ref 30.0–36.0)
MCV: 89.3 fL (ref 80.0–100.0)
Platelets: 277 10*3/uL (ref 150–400)
RBC: 4.5 MIL/uL (ref 3.87–5.11)
RDW: 12.8 % (ref 11.5–15.5)
WBC: 7 10*3/uL (ref 4.0–10.5)
nRBC: 0 % (ref 0.0–0.2)

## 2020-01-28 LAB — PROTIME-INR
INR: 1.1 (ref 0.8–1.2)
Prothrombin Time: 13.7 seconds (ref 11.4–15.2)

## 2020-01-28 LAB — MAGNESIUM: Magnesium: 1.9 mg/dL (ref 1.7–2.4)

## 2020-01-28 LAB — GLUCOSE, CAPILLARY: Glucose-Capillary: 233 mg/dL — ABNORMAL HIGH (ref 70–99)

## 2020-01-28 LAB — ETHANOL: Alcohol, Ethyl (B): <10 mg/dL (ref <10)

## 2020-01-28 LAB — RESPIRATORY PANEL BY RT PCR (FLU A&B, COVID)
Influenza A by PCR: NEGATIVE
Influenza B by PCR: NEGATIVE
SARS Coronavirus 2 by RT PCR: NEGATIVE

## 2020-01-28 LAB — CBG MONITORING, ED: Glucose-Capillary: 54 mg/dL — ABNORMAL LOW (ref 70–99)

## 2020-01-28 LAB — APTT: aPTT: 25 seconds (ref 24–36)

## 2020-01-28 LAB — TROPONIN I (HIGH SENSITIVITY)
Troponin I (High Sensitivity): 5 ng/L (ref <18)
Troponin I (High Sensitivity): 5 ng/L (ref <18)
Troponin I (High Sensitivity): 5 ng/L (ref <18)

## 2020-01-28 MED ORDER — ASPIRIN EC 81 MG PO TBEC
81.0000 mg | DELAYED_RELEASE_TABLET | Freq: Every day | ORAL | Status: DC
  Filled 2020-01-28: qty 1

## 2020-01-28 MED ORDER — SODIUM CHLORIDE 0.9% FLUSH: 3.0000 mL | Freq: Once | INTRAVENOUS | Status: DC

## 2020-01-28 MED ORDER — EZETIMIBE 10 MG PO TABS
10.0000 mg | ORAL_TABLET | Freq: Every day | ORAL | Status: DC
  Administered 2020-01-29: 10 mg via ORAL
  Filled 2020-01-28: qty 1

## 2020-01-28 MED ORDER — LATANOPROST 0.005 % OP SOLN
1.0000 [drp] | Freq: Every day | OPHTHALMIC | Status: DC
  Administered 2020-01-28: 1 [drp] via OPHTHALMIC
  Filled 2020-01-28: qty 2.5

## 2020-01-28 MED ORDER — FLUTICASONE FUROATE-VILANTEROL 200-25 MCG/INH IN AEPB
1.0000 | INHALATION_SPRAY | Freq: Every day | RESPIRATORY_TRACT | Status: DC
  Administered 2020-01-29: 1 via RESPIRATORY_TRACT
  Filled 2020-01-28: qty 28

## 2020-01-28 MED ORDER — INSULIN ASPART 100 UNIT/ML ~~LOC~~ SOLN
0.0000 [IU] | Freq: Three times a day (TID) | SUBCUTANEOUS | Status: DC
  Administered 2020-01-29: 08:00:00 5 [IU] via SUBCUTANEOUS
  Administered 2020-01-29: 7 [IU] via SUBCUTANEOUS
  Administered 2020-01-29: 5 [IU] via SUBCUTANEOUS

## 2020-01-28 MED ORDER — IOHEXOL 350 MG/ML SOLN
100.0000 mL | Freq: Once | INTRAVENOUS | Status: AC | PRN
  Administered 2020-01-28: 75 mL via INTRAVENOUS

## 2020-01-28 MED ORDER — NITROGLYCERIN 0.4 MG SL SUBL: 0.4000 mg | SUBLINGUAL_TABLET | SUBLINGUAL | Status: DC | PRN

## 2020-01-28 MED ORDER — SODIUM CHLORIDE 0.9% FLUSH
3.0000 mL | Freq: Two times a day (BID) | INTRAVENOUS | Status: DC
  Administered 2020-01-28 – 2020-01-29 (×2): 3 mL via INTRAVENOUS

## 2020-01-28 MED ORDER — INSULIN GLARGINE 100 UNIT/ML ~~LOC~~ SOLN
10.0000 [IU] | Freq: Every day | SUBCUTANEOUS | Status: DC
  Administered 2020-01-28: 23:00:00 10 [IU] via SUBCUTANEOUS
  Filled 2020-01-28 (×2): qty 0.1

## 2020-01-28 MED ORDER — STROKE: EARLY STAGES OF RECOVERY BOOK
Freq: Once | Status: AC
  Filled 2020-01-28: qty 1

## 2020-01-28 MED ORDER — HYDRALAZINE HCL 20 MG/ML IJ SOLN: 10.0000 mg | INTRAMUSCULAR | Status: DC | PRN

## 2020-01-28 MED ORDER — ALBUTEROL SULFATE (2.5 MG/3ML) 0.083% IN NEBU: 3.0000 mL | INHALATION_SOLUTION | RESPIRATORY_TRACT | Status: DC | PRN

## 2020-01-28 MED ORDER — ACETAMINOPHEN 650 MG RE SUPP: 650.0000 mg | Freq: Four times a day (QID) | RECTAL | Status: DC | PRN

## 2020-01-28 MED ORDER — ACETAMINOPHEN 325 MG PO TABS: 650.0000 mg | ORAL_TABLET | Freq: Four times a day (QID) | ORAL | Status: DC | PRN

## 2020-01-28 MED ORDER — ASPIRIN 81 MG PO CHEW
324.0000 mg | CHEWABLE_TABLET | Freq: Once | ORAL | Status: AC
  Administered 2020-01-28: 324 mg via ORAL
  Filled 2020-01-28: qty 4

## 2020-01-28 NOTE — H&P (Signed)
History and Physical    PLEASE NOTE THAT DRAGON DICTATION SOFTWARE WAS USED IN THE CONSTRUCTION OF THIS NOTE.   [name] [name] [id number] DOB: 1941-07-21 DOA: 01/28/2020  PCP: Arsenio Katz, NP Patient coming from: home   I have personally briefly reviewed patient's old medical records in [place]  Chief Complaint: left upper extremity weakness  HPI: [name] [name] is a 79 y.o. 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   The patient reports that she went to sleep at approximately 2130 on 01/27/2020 in the absence of any acute focal neurologic deficits before waking up around 0500 on the morning of 01/28/2020 with new onset left upper extremity weakness.  She denies any associated paresthesias, numbness, dysphagia, dizziness, vertigo, nausea, vomiting, change in vision, blurry vision, diplopia, word finding difficulties, headaches, facial droop, or slurring of speech.   Denies any previous known history of stroke.  In terms of modifiable risk factors, the patient acknowledges a history of hypertension, hyperlipidemia, type 2 diabetes mellitus.  She also reports that she is a former smoker, having previously smoked half pack per day for 40 years before completely quitting smoking greater than 20 years ago, without any subsequent resumption.  She denies any known history of underlying atrial fibrillation.  She reports good compliance with her outpatient antihypertensive regimen as well as her outpatient Zetia.  She reports that she had previously briefly taken rosuvastatin as an outpatient, but that this medication was subsequently discontinued in the  setting of perceived muscle cramps as a result of this statin.  Consequently, she is not currently on a statin. the patient also acknowledges good compliance with her outpatient insulin regimen, which consists of Lantus 12 units subcu every morning as well as 47 units subcu nightly in addition to scheduled Humalog 16 units 3 times daily with meals.  She is also on Metformin.  Per chart review, it appears that most recent hemoglobin A1c was 8.9% when checked in April 2017.  In the setting of a history of coronary artery disease, the patient is on a daily baby aspirin.  She denies taking any other blood thinning agents at home.   Upon awaking around 0500 on the morning of 01/28/2020 with new onset left upper extremity weakness, the patient reports that she then developed sharp, nonradiating, substernal chest discomfort while at rest.  She reports that this discomfort was nonexertional, and resolved within 4 to 5 hours without interval use of nitroglycerin.  Denies any subsequent chest discomfort. denies any associated palpitations, diaphoresis, nausea, vomiting, shortness of breath, presyncope, or syncope.  Per chart review, cardiac history notable for the following: The patient had previously undergone angioplasty to the distal LAD, followed by adenosine stress Myoview in 2008, which reportedly demonstrated no evidence of reversible ischemia.  Subsequently, she underwent PCI with stent placement in June 2019.   The patient denies any recent subjective fever, chills, rigors, or generalized myalgias. Denies any recent neck stiffness, rhinitis, rhinorrhea, sore throat, cough, abdominal pain, diarrhea, or rash. No recent traveling or known COVID-19 exposures. Denies dysuria, gross hematuria, or change in urinary urgency/frequency.    She reports that her left upper extremity weakness has continued to improve following onset, although strength in  the left upper extremity has not yet returned to baseline.    ED  Course:  Vital signs in the ED were notable for the following: Temperature max 98.1; heart rate 57-65; blood pressure 154/63 - 202/66; respiratory rate 16-20; oxygen saturation 95 to 97% on room air.  Labs were notable for the following: Troponin x3 negative.  CMP notable for the following: Sodium 138, potassium 4.0, bicarbonate 28, creatinine 0.63, and liver enzymes were found to be within normal limits.  CBC notable for white blood cell count of 7000 hemoglobin 13.4.  INR 1.1.  Urinalysis showed no evidence of white blood cells, and was negative for leukocyte esterase as well as nitrites.  Point-of-care glucose found to be 54.  Nasopharyngeal COVID-19 PCR checked in the ED today found to be negative.  Imaging performed in the ED today was notable for the following: noncontrast CT the head showed atrophy and chronic small vessel ischemic changes, but no evidence of acute intracranial process, including no evidence of intracranial hemorrhage or acute ischemic infarct.  CTA of the head showed mild chronic small vessel ischemic changes within the cerebral white matter and basal ganglia, with small chronic lacunar infarct within the left basal ganglia in the absence of any evidence of intracranial large vessel occlusion.  CTA of the neck showed 50 to 60% stenosis of the right ICA in the absence of evidence of flow-limiting stenosis, dissection, aneurysm, or thrombus.  Presenting EKG, by comparison to most recent prior EKG from 08/24/2019, showed sinus rhythm with heart rate 61, no evidence of T wave or ST changes.  Additionally, chest x-ray showed no evidence of acute cardiopulmonary process.  The emergency department physician, Dr. Rogene Houston, discussed the patient's case and imaging with the on-call neurologist, Dr. Merlene Laughter, who recommended overnight observation to the hospitalist service for additional evaluation and management of suspected presenting acute ischemic CVA, including recommendation for MRI brain  as well as evaluation for potential modifiable CV risk factors. He confirms that patient presents outside of window for TPA administration. Dr. Merlene Laughter to formally consult on patient in the morning (01/29/20).   While in the ED, the following were administered: Aspirin 324 mg p.o. x1 after passing bedside swallow screen.     Review of Systems: As per HPI otherwise 10 point review of systems negative.   Past Medical History:  Diagnosis Date  . Anxiety   . Arthritis    "neck" (03/15/2018)  . CAD (coronary artery disease)    Status post POBA distal LAD, status post Cardiolite Myoview 2008 negative for ischemia  . COPD (chronic obstructive pulmonary disease) ([place])   . Depression   . GERD (gastroesophageal reflux disease)   . Hypercholesterolemia   . Hypertension   . Peripheral vascular disease ([place])     status post iliac stent July 2001   . Type II diabetes mellitus ([place])     Past Surgical History:  Procedure Laterality Date  . ANTERIOR CERVICAL DECOMP/DISCECTOMY FUSION  2001  . BACK SURGERY    . BREAST SURGERY     biopsy left breat-benign  . CATARACT EXTRACTION, BILATERAL Bilateral   . CHOLECYSTECTOMY OPEN    . CORONARY ANGIOPLASTY     POBA distal LAD,  . CORONARY STENT INTERVENTION N/A 03/15/2018   Procedure: CORONARY STENT INTERVENTION;  Surgeon: Jettie Booze, MD;  Location: [place] [facility];  Service: Cardiovascular;  Laterality: N/A;  . DILATION AND CURETTAGE OF UTERUS  1960s  . EYE SURGERY Bilateral    "lasers"  .  LEFT HEART CATH AND CORONARY ANGIOGRAPHY N/A 03/15/2018   Procedure: LEFT HEART CATH AND CORONARY ANGIOGRAPHY;  Surgeon: Jettie Booze, MD;  Location: [place] [facility];  Service: Cardiovascular;  Laterality: N/A;  . PERIPHERAL VASCULAR INTERVENTION Right 04/2000    iliac stent   . TUBAL LIGATION      Social History:  reports that she quit smoking about 24 years ago. Her smoking use included cigarettes. She started smoking about 58 years ago.  She has a 20.00 pack-year smoking history. She has never used smokeless tobacco. She reports that she does not drink alcohol or use drugs.   Allergies  Allergen Reactions  . Alendronate     Other reaction(s): Other (See Comments) Leg edema  . Clonidine Derivatives Other (See Comments)    dizziness  . Crestor [Rosuvastatin]     Muscle cramps  . Lisinopril Other (See Comments)    Tongue swelling  . Losartan Other (See Comments)    TONGUE SWELLING  . Codeine Nausea And Vomiting  . Hydrocodone Nausea And Vomiting    Family History  Problem Relation Age of Onset  . Heart failure Father   . Heart failure Mother   . Diabetes Mother   . COPD Brother     Prior to Admission medications   Medication Sig Start Date End Date Taking? Authorizing Provider  amLODipine (NORVASC) 5 MG tablet TAKE 1 TABLET ONCE DAILY. 01/21/20  Yes BranchAlphonse Guild, MD  aspirin 81 MG tablet Take 81 mg by mouth at bedtime.    Yes [provider]  betamethasone dipropionate (DIPROLENE) 0.05 % cream Apply 1 application topically 2 (two) times daily.   Yes [provider]  bimatoprost (LUMIGAN) 0.01 % SOLN Place 1 drop into both eyes at bedtime.   Yes [provider]  chlorthalidone (HYGROTON) 25 MG tablet TAKE 1/2 TABLET BY MOUTH TWICE WEEKLY ON MONDAYS AND FRIDAYS. 10/08/19  Yes Branch, Alphonse Guild, MD  ezetimibe (ZETIA) 10 MG tablet Take 1 tablet (10 mg total) by mouth daily. 08/24/19 01/28/20 Yes BranchAlphonse Guild, MD  hydrALAZINE (APRESOLINE) 25 MG tablet Take 25 mg by mouth. Take 1/2 tab every morning & 1 tab every evening   Yes [provider]  Icosapent Ethyl (VASCEPA) 1 g CAPS Take 1 capsule (1 g total) by mouth 2 (two) times daily. 08/02/19  Yes BranchAlphonse Guild, MD  insulin glargine (LANTUS) 100 UNIT/ML injection Inject 12-47 Units into the skin See admin instructions. Inject 12 units SQ every morning and inject 47 units SQ every evening around 1600   Yes [provider]  insulin lispro (HUMALOG) 100 UNIT/ML injection Inject 16 Units into the skin 3 (three) times daily with meals.    Yes [provider]  labetalol (NORMODYNE) 200 MG tablet TAKE (1) TABLET TWICE DAILY. 01/08/20  Yes BranchAlphonse Guild, MD  magnesium oxide (MAG-OX) 400 MG tablet Take 400 mg by mouth daily.    Yes [provider]  metFORMIN (GLUCOPHAGE) 1000 MG tablet Take 1,000 mg by mouth as needed. As needed   Yes [provider]  nitroGLYCERIN (NITROSTAT) 0.4 MG SL tablet Place 1 tablet (0.4 mg total) under the tongue every 5 (five) minutes as needed for chest pain. 03/16/18  Yes Daune Perch, NP  oxyCODONE-acetaminophen (PERCOCET) 10-325 MG tablet Take 1 tablet by mouth 2 (two) times daily.   Yes [provider]  SYMBICORT 160-4.5 MCG/ACT inhaler Inhale 2 puffs into the lungs daily.  10/18/13  Yes  [provider]     Objective    Physical Exam: Vitals:   01/28/20 1400 01/28/20 1415 01/28/20 1430 01/28/20 1630  BP:      Pulse: 62 62 64 63  Resp: 17 16 19 20   Temp:      TempSrc:      SpO2: 95% 94% 97% 96%  Weight:      Height:        General: appears to be stated age; alert, oriented Skin: warm, dry, no rash Head:  AT/[place] Eyes:  PEARL b/l, EOMI Mouth:  Oral mucosa membranes appear moist, normal dentition Neck: supple; trachea midline Heart:  RRR; did not appreciate any M/R/G Lungs: CTAB, did not appreciate any wheezes, rales, or rhonchi Abdomen: + BS; soft, ND, NT Extremities: no peripheral edema, no muscle wasting Neuro: 5/5 strength of the lower extremities bilaterally; 5/5 strength in RUE; 4/5 strength in the LUE; sensation intact in upper and lower extremities b/l; cranial nerves II through XII grossly intact; no evidence suggestive of slurred speech, dysarthria, or facial droop; no evidence of dysdiadochokinesia. Normal muscle tone. No tremors. 2+ DTR's in upper and lower extremities bilaterally.     Labs on  Admission: I have personally reviewed following labs and imaging studies  CBC: Recent Labs  Lab 01/28/20 1225  WBC 7.0  NEUTROABS 4.1  HGB 13.4  HCT 40.2  MCV 89.3  PLT 99991111   Basic Metabolic Panel: Recent Labs  Lab 01/28/20 1225  NA 138  K 4.0  CL 100  CO2 28  GLUCOSE 93  BUN 11  CREATININE 0.63  CALCIUM 9.5   GFR: Estimated Creatinine Clearance: 60.2 mL/min (by C-G formula based on SCr of 0.63 mg/dL). Liver Function Tests: Recent Labs  Lab 01/28/20 1225  AST 19  ALT 20  ALKPHOS 109  BILITOT 0.8  PROT 6.9  ALBUMIN 3.9   No results for input(s): LIPASE, AMYLASE in the last 168 hours. No results for input(s): AMMONIA in the last 168 hours. Coagulation Profile: Recent Labs  Lab 01/28/20 1225  INR 1.1   Cardiac Enzymes: No results for input(s): CKTOTAL, CKMB, CKMBINDEX, TROPONINI in the last 168 hours. BNP (last 3 results) No results for input(s): PROBNP in the last 8760 hours. HbA1C: No results for input(s): HGBA1C in the last 72 hours. CBG: Recent Labs  Lab 01/28/20 1427  GLUCAP 54*   Lipid Profile: No results for input(s): CHOL, HDL, LDLCALC, TRIG, CHOLHDL, LDLDIRECT in the last 72 hours. Thyroid Function Tests: No results for input(s): TSH, T4TOTAL, FREET4, T3FREE, THYROIDAB in the last 72 hours. Anemia Panel: No results for input(s): VITAMINB12, FOLATE, FERRITIN, TIBC, IRON, RETICCTPCT in the last 72 hours. Urine analysis:    Component Value Date/Time   COLORURINE STRAW (A) 01/28/2020 1149   APPEARANCEUR CLEAR 01/28/2020 1149   LABSPEC 1.005 01/28/2020 1149   PHURINE 8.0 01/28/2020 1149   GLUCOSEU NEGATIVE 01/28/2020 1149   HGBUR NEGATIVE 01/28/2020 [place] 01/28/2020 [place] 01/28/2020 1149   PROTEINUR NEGATIVE 01/28/2020 1149   NITRITE NEGATIVE 01/28/2020 [place] 01/28/2020 1149    Radiological Exams on Admission: CT Angio Head W or Wo Contrast  Result Date:  01/28/2020 CLINICAL DATA:  Neuro deficit, acute, stroke suspected. Additional history provided: Chest pain which began at 6 a.m., patient reports pain was left-sided and radiating into left arm. EXAM: CT ANGIOGRAPHY HEAD AND NECK TECHNIQUE: Multidetector CT imaging of the head and neck was performed using  the standard protocol during bolus administration of intravenous contrast. Multiplanar CT image reconstructions and MIPs were obtained to evaluate the vascular anatomy. Carotid stenosis measurements (when applicable) are obtained utilizing NASCET criteria, using the distal internal carotid diameter as the denominator. CONTRAST:  49mL OMNIPAQUE IOHEXOL 350 MG/ML SOLN COMPARISON:  Noncontrast head CT performed earlier the same day 01/28/2020 FINDINGS: CT HEAD FINDINGS Brain: Stable mild chronic small vessel ischemic changes within the cerebral white matter and basal ganglia. This includes a small chronic lacunar infarct within the left basal ganglia. Mild generalized parenchymal atrophy. There is no acute intracranial hemorrhage. No demarcated cortical infarct. No extra-axial fluid collection. No evidence of intracranial mass. No midline shift. Vascular: Reported below. Skull: Normal. Negative for fracture or focal lesion. Sinuses: Moderate mucosal thickening within the left maxillary sinus. No significant mastoid effusion. Orbits: Visualized orbits show no acute finding. Review of the MIP images confirms the above findings CTA NECK FINDINGS Aortic arch: Standard aortic branching. Atherosclerotic plaque within the visualized aortic arch and proximal major branch vessels of the neck. No hemodynamically significant innominate or proximal subclavian artery stenosis. Right carotid system: The CCA is patent to the bifurcation without significant stenosis. Calcified plaque within the carotid bifurcation. Resultant 50-60% stenosis at the origin of the ICA compared to the vessel more distally within the neck. There is  additional moderate mixed plaque within the proximal right ICA with out significant stenosis at this site (50% or greater). Left carotid system: The CCA and ICA are patent within the neck without significant stenosis (50% or greater) mild atherosclerotic plaque at the carotid bifurcation and within the proximal ICA. Vertebral arteries: The vertebral arteries are patent within the neck bilaterally without significant stenosis. Mild scattered calcified plaque within both vessels. The left vertebral artery is slightly dominant. Skeleton: No acute bony abnormality or aggressive osseous lesion. Sequela of prior C6-C7 ACDF. Other neck: No neck mass or lymphadenopathy. Upper chest: No consolidation within the imaged lung apices. Mild left apical pleuroparenchymal scarring. Review of the MIP images confirms the above findings CTA HEAD FINDINGS Anterior circulation: The intracranial internal carotid arteries are patent. Calcified plaque within the cavernous and paraclinoid segments with sites of mild to moderate stenosis bilaterally. The M1 middle cerebral arteries are patent without significant stenosis. No M2 proximal branch occlusion or high-grade proximal stenosis is identified. The anterior cerebral arteries are patent without significant proximal stenosis. No intracranial aneurysm is identified. Posterior circulation: The intracranial vertebral arteries are patent without significant stenosis, as is the basilar artery. Mild calcified plaque within the V4 segments bilaterally. The bilateral posterior cerebral arteries are patent without significant proximal stenosis. Posterior communicating arteries are hypoplastic or absent bilaterally. Venous sinuses: Within limitations of contrast timing, no convincing thrombus. Anatomic variants: As described Review of the MIP images confirms the above findings IMPRESSION: CT head: 1. No evidence of acute intracranial abnormality. 2. Stable mild generalized parenchymal atrophy and  chronic small vessel ischemic disease with small chronic left basal ganglia lacunar infarct. 3. Moderate left maxillary sinus mucosal thickening. CTA neck: 1. The right common and internal carotid arteries are patent within the neck. Calcified plaque results in 50-60% stenosis at the origin of the right ICA. Additional moderate mixed plaque more distally within the proximal right ICA without significant stenosis at this site. 2. The left common carotid, left internal carotid and bilateral vertebral arteries are patent within the neck without significant stenosis. CTA head: 1. No intracranial large vessel occlusion or proximal high-grade arterial stenosis. 2. Calcified plaque  within the intracranial ICAs with sites of mild-to-moderate stenosis bilaterally. Electronically Signed   By: Kellie Simmering DO   On: 01/28/2020 16:27   DG Chest 2 View  Result Date: 01/28/2020 CLINICAL DATA:  Chest pain.  Hypertension. EXAM: CHEST - 2 VIEW COMPARISON:  Chest CT September 24, 2019 FINDINGS: Lungs are clear. Heart size and pulmonary vascularity are normal. No adenopathy. There is aortic atherosclerosis. There old healed rib fractures on the left. Postoperative change noted in the lower cervical spine region. No pneumothorax. IMPRESSION: No edema or airspace opacity. Cardiac silhouette normal. Evidence of old rib trauma on the left. Aortic Atherosclerosis (ICD10-I70.0). Electronically Signed   By: Lowella Grip III M.D.   On: 01/28/2020 12:02   CT HEAD WO CONTRAST  Result Date: 01/28/2020 CLINICAL DATA:  Ataxia.  Hypertension.  Acute onset. EXAM: CT HEAD WITHOUT CONTRAST TECHNIQUE: Contiguous axial images were obtained from the base of the skull through the vertex without intravenous contrast. COMPARISON:  06/17/2016 FINDINGS: Brain: Age related atrophy. Chronic small-vessel ischemic changes of the cerebral hemispheric white matter and basal ganglia regions. No sign of acute infarction, mass lesion, hemorrhage,  hydrocephalus or extra-axial collection. Vascular: There is atherosclerotic calcification of the major vessels at the base of the brain. Skull: Negative Sinuses/Orbits: Left maxillary sinusitis. Other sinuses are clear. Orbits negative. Other: None IMPRESSION: No acute intracranial finding. Atrophy and chronic small-vessel ischemic changes. Left maxillary sinusitis. Electronically Signed   By: Nelson Chimes M.D.   On: 01/28/2020 12:59   CT Angio Neck W and/or Wo Contrast  Result Date: 01/28/2020 CLINICAL DATA:  Neuro deficit, acute, stroke suspected. Additional history provided: Chest pain which began at 6 a.m., patient reports pain was left-sided and radiating into left arm. EXAM: CT ANGIOGRAPHY HEAD AND NECK TECHNIQUE: Multidetector CT imaging of the head and neck was performed using the standard protocol during bolus administration of intravenous contrast. Multiplanar CT image reconstructions and MIPs were obtained to evaluate the vascular anatomy. Carotid stenosis measurements (when applicable) are obtained utilizing NASCET criteria, using the distal internal carotid diameter as the denominator. CONTRAST:  23mL OMNIPAQUE IOHEXOL 350 MG/ML SOLN COMPARISON:  Noncontrast head CT performed earlier the same day 01/28/2020 FINDINGS: CT HEAD FINDINGS Brain: Stable mild chronic small vessel ischemic changes within the cerebral white matter and basal ganglia. This includes a small chronic lacunar infarct within the left basal ganglia. Mild generalized parenchymal atrophy. There is no acute intracranial hemorrhage. No demarcated cortical infarct. No extra-axial fluid collection. No evidence of intracranial mass. No midline shift. Vascular: Reported below. Skull: Normal. Negative for fracture or focal lesion. Sinuses: Moderate mucosal thickening within the left maxillary sinus. No significant mastoid effusion. Orbits: Visualized orbits show no acute finding. Review of the MIP images confirms the above findings CTA NECK  FINDINGS Aortic arch: Standard aortic branching. Atherosclerotic plaque within the visualized aortic arch and proximal major branch vessels of the neck. No hemodynamically significant innominate or proximal subclavian artery stenosis. Right carotid system: The CCA is patent to the bifurcation without significant stenosis. Calcified plaque within the carotid bifurcation. Resultant 50-60% stenosis at the origin of the ICA compared to the vessel more distally within the neck. There is additional moderate mixed plaque within the proximal right ICA with out significant stenosis at this site (50% or greater). Left carotid system: The CCA and ICA are patent within the neck without significant stenosis (50% or greater) mild atherosclerotic plaque at the carotid bifurcation and within the proximal ICA. Vertebral arteries: The vertebral  arteries are patent within the neck bilaterally without significant stenosis. Mild scattered calcified plaque within both vessels. The left vertebral artery is slightly dominant. Skeleton: No acute bony abnormality or aggressive osseous lesion. Sequela of prior C6-C7 ACDF. Other neck: No neck mass or lymphadenopathy. Upper chest: No consolidation within the imaged lung apices. Mild left apical pleuroparenchymal scarring. Review of the MIP images confirms the above findings CTA HEAD FINDINGS Anterior circulation: The intracranial internal carotid arteries are patent. Calcified plaque within the cavernous and paraclinoid segments with sites of mild to moderate stenosis bilaterally. The M1 middle cerebral arteries are patent without significant stenosis. No M2 proximal branch occlusion or high-grade proximal stenosis is identified. The anterior cerebral arteries are patent without significant proximal stenosis. No intracranial aneurysm is identified. Posterior circulation: The intracranial vertebral arteries are patent without significant stenosis, as is the basilar artery. Mild calcified plaque  within the V4 segments bilaterally. The bilateral posterior cerebral arteries are patent without significant proximal stenosis. Posterior communicating arteries are hypoplastic or absent bilaterally. Venous sinuses: Within limitations of contrast timing, no convincing thrombus. Anatomic variants: As described Review of the MIP images confirms the above findings IMPRESSION: CT head: 1. No evidence of acute intracranial abnormality. 2. Stable mild generalized parenchymal atrophy and chronic small vessel ischemic disease with small chronic left basal ganglia lacunar infarct. 3. Moderate left maxillary sinus mucosal thickening. CTA neck: 1. The right common and internal carotid arteries are patent within the neck. Calcified plaque results in 50-60% stenosis at the origin of the right ICA. Additional moderate mixed plaque more distally within the proximal right ICA without significant stenosis at this site. 2. The left common carotid, left internal carotid and bilateral vertebral arteries are patent within the neck without significant stenosis. CTA head: 1. No intracranial large vessel occlusion or proximal high-grade arterial stenosis. 2. Calcified plaque within the intracranial ICAs with sites of mild-to-moderate stenosis bilaterally. Electronically Signed   By: Kellie Simmering DO   On: 01/28/2020 16:27     EKG: Independently reviewed, with result as described above.    Assessment/Plan   [name] [name] is a 79 y.o. female with medical history significant for type 2 diabetes mellitus, hypertension, hyperlipidemia, anxiety, COPD, coronary artery disease status post PCI with stent placement in June 2019, who is admitted to [facility] on 01/28/2020 with suspected acute ischemic CVA after presenting from home to [facility] Emergency Department complaining of left upper extremity weakness.    Principal Problem:   Ischemic cerebrovascular accident (CVA) ([place]) Active Problems:   Hypertension   Diabetes  mellitus ([place])   Hyperlipidemia   Hypoglycemia   Atypical chest pain   #) Acute ischemic CVA: Suspected diagnosis on the basis of acute onset of left upper extremity weakness upon awaking at 0500 on the morning of 01/28/2020, having gone to sleep completely asymptomatic on 01/27/20 at 2130, representing last known normal.  As further described above, presenting noncontrast CT of the head shows no evidence of acute intracranial process, while CTA of the head and neck show 50 to 60% stenosis of the right ICA in the absence of any evidence of flow-limiting stenosis, dissection, aneurysm, or thrombus.  Presentation is not associated with any additional acute focal neurologic deficits, and presenting left upper extremity weakness appears to be improving, although not yet back to baseline.   Case and imaging were discussed with the on-call neurologist, Dr. Merlene Laughter, who recommended overnight observation to the hospitalist service for additional evaluation and management of suspected  presenting acute ischemic CVA, including recommendation for MRI brain as well as evaluation for potential modifiable CV risk factors. He confirms that patient presents outside of window for TPA administration. Dr. Merlene Laughter to formally consult on patient in the morning (01/29/20).   The patient possesses multiple potential modifiable CV risk factors, including history of type 2 diabetes mellitus, hypertension, hyperlipidemia.  She also reports that she is a former smoker, and denies any known history of underlying atrial fibrillation.  EKG in the ED today showed sinus rhythm in the absence of evidence of acute ischemic changes, as further described above.  On a daily baby aspirin, but otherwise not on any antiplatelet or anticoagulant medications at home.  Received aspirin 325 mg p.o. x1 in the ED this evening.  The patient refused offer for statin medication on the basis of previous muscle cramps that she had experienced when taking  rosuvastatin as an outpatient. Will engage in identification and modification of acute ischemic CVA risk factors, as further detailed below.   Discussed risk factors for stroke that are specific to the patient for stroke risk reduction including HTN, DM2, and HLD. Patient was counseled on importance of optimizing management of such. Of note, nursing bedside swallow screen was reportedly passed while in the ED.   Plan: Head of the bed at 30 degrees. Neuro checks per protocol. VS per protocol. Will allow for permissive hypertension for 48 hours following onset of acute focal neurologic deficits.  In the context of borderline bradycardia, I have ordered as needed IV hydralazine for systolic blood pressure greater than [id number] mmHg or diastolic blood pressure greater than 120 mmHg.  Monitor on telemetry, including monitoring for atrial fibrillation as modifiable risk factor for acute ischemic CVA.  Check lipid panel in the morning.  Check hemoglobin A1c.  PT/OT consults have been ordered to occur in the morning.  TTE with bubble study ordered for the morning to evaluate for intracardiac thrombus, septal aneurysm, or septal defect. Dr. Merlene Laughter of neurology to seen patient in the morning. Consider course of dual anti-platelet therapy, with ensuing formal recommendations via neurology currently pending. For now will continue ASA 81 mg PO Qdaily, although subsequent escalation of antiplatelet therapy is anticipated, as above.  MRI of the brain has been ordered for the morning.        #) Hypoglycemia: Initial point-of-care glucose associated with value of 54, at which time it appears that the patient was asymptomatic.  This is in the context of a history of type 2 diabetes mellitus, for which the patient is on Lantus 12 units subcu every morning as well as 47 units subcu nightly as well as scheduled Humalog 16 units 3 times daily in addition to Metformin.  Most recent hemoglobin A1c noted to be 8.9% when checked in  April 2017.  The context of afternoon hypoglycemia, will proceed conservatively with insulin regimen for now, particularly with the morning basal insulin as well as Humalog, as further described below.  Plan: Check hemoglobin A1c.  In terms of conservative approach to basal insulin during this hospitalization, will start with Lantus 10 units subcu nightly.  Given that presentation was associated with afternoon hypoglycemia, will hold we will also hold home scheduled Humalog for now.  Accu-Cheks before every meal and at bedtime with low-dose sliding scale insulin.  Hypoglycemic protocol also initiated.  Hold home Metformin during this hospitalization.     #) Atypical Chest Pain: Approximately 5 hours of constant sharp, nonexertional, nonradiating substernal chest discomfort that resolved  spontaneously in the absence of any nitroglycerin, without subsequent recurrence around 10 AM on 01/28/20.  Appears to be atypical for ACS.  Of note, troponin x3 have been negative, including no elevation following passage of 12 hours following onset of chest discomfort, further speaking against the possibility of ACS.  Additionally, EKG shows no evidence of acute ischemic changes, including no evidence of STEMI, all chest x-ray shows no evidence of acute cardiopulmonary process, including no evidence of pneumothorax.  This is all in the context of a known history of CAD, having undergone PCI with stent placement in June 2019, as further described above. patient is currently chest pain-free.  Full dose aspirin administered in the ED this evening.  Differential includes noncardiac etiologies, including anxiety in the context of a documented history of such, with onset of chest discomfort apparently occurring after the patient realized that she was experiencing left upper extremity weakness.  Differential also includes GERD the context of a known history of such.  Presentation clinically is less suggestive of acute pulmonary  embolism.    Plan: Monitor on telemetry. PRN sublingual nitroglycerin. PRN EKG for subsequent episodes of chest pain. Check serum Mg level and repeat BMP in the morning, with prn supplementation to maintain Mg and potassium levels greater than or equal to 2.0 and 4.0, respectively, to further reduce risk of ventricular arrhythmia. Repeat CBC in the AM.       #) Essential Hypertension: home antihypertensive regimen includes Norvasc, chlorthalidone, hydralazine, labetalol. Systolic blood pressure in the ED today noted to be in the range of 154 - 202 mmHg.  In the context of presenting suspected acute ischemic CVA, will observe permissive hypertension for 48 hours from with last known normal of 2130 on 01/27/20, during which time will treat systolic BP > XX123456 mmHg and diastolic BP > 123456 mmHg via prn IV Hydralazine, which was chosen over as needed IV labetalol given borderline bradycardia at presentation.   Plan: Close monitoring of ensuing blood pressures via vital sign checks per acute ischemic stroke protocol, as above.  Permissive hypertension, as above, with as needed IV hydralazine, with parameters as quantified above, during which time we will hold home oral antihypertensive medications.      #) COPD: Documented history of such, in the context of the patient's report of being a former smoker associated with at least a 20-pack-year history of smoking, before completely quitting greater than 20 years ago without subsequent resumption.  Outpatient respiratory regimen appears to consist of Symbicort.  No evidence of acute exacerbation at this time.  Plan: Continue outpatient Symbicort.  As needed albuterol inhaler.     DVT prophylaxis: SCDs Code Status: Full code Family Communication: none Disposition Plan: Per Rounding Team Consults called: Case/imaging were discussed with the on-call neurologist, Dr. Merlene Laughter, as further described above. Admission status: observation; med-tele.      PLEASE NOTE THAT DRAGON DICTATION SOFTWARE WAS USED IN THE CONSTRUCTION OF THIS NOTE.   [facility] Pager [phone] From 3PM- 11PM.   Otherwise, please contact night-coverage  [email or web address] Password [facility]  01/28/2020, 5:08 PM

## 2020-01-28 NOTE — ED Provider Notes (Signed)
[facility] EMERGENCY DEPARTMENT Provider Note   CSN: [id number] Arrival date & time: 01/28/20  1038     History Chief Complaint  Patient presents with  . Chest Pain    [name] [name] is a 79 y.o. female.  Patient went to bed at 2130 last evening without any symptoms.  Awoke this morning at 0 530 with substernal chest pain that lasted until about 1030 this morning.  Patient did not take any nitro for that.  The patient's main concern was that there was weakness and numbness to her left upper extremity and when she walked her gait seemed to be off as well.  She was concerned about stroke.  Patient known to have coronary artery disease does have a stent.  Also has type 2 diabetes and COPD.  Patient states that the left upper extremity weakness is starting to improve but is still not back to normal.        Past Medical History:  Diagnosis Date  . Anxiety   . Arthritis    "neck" (03/15/2018)  . CAD (coronary artery disease)    Status post POBA distal LAD, status post Cardiolite Myoview 2008 negative for ischemia  . COPD (chronic obstructive pulmonary disease) ([place])   . Depression   . GERD (gastroesophageal reflux disease)   . Hypercholesterolemia   . Hypertension   . Peripheral vascular disease ([place])     status post iliac stent July 2001   . Type II diabetes mellitus [facility])     Patient Active Problem List   Diagnosis Date Noted  . Status post coronary artery stent placement   . Angina pectoris ([place]) 03/15/2018  . Chest pain 06/17/2016  . Hypertensive urgency 06/17/2016  . Hyperlipidemia 10/29/2013  . CAD (coronary artery disease) 10/29/2013  . DM type 2 causing vascular disease ([place]) 10/29/2013  . Asthma 10/29/2013  . Osteopenia 10/29/2013  . Insomnia 10/29/2013  . Chronic constipation 10/29/2013  . Mild carotid artery disease ([place]) 06/09/2012  . Skin lesion 06/09/2012  . 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 Obstructive sleep apnea 06/09/2012  . Hypertension   . Peripheral  vascular disease ([place])   . Coronary artery disease   . Diabetes mellitus ([place])     Past Surgical History:  Procedure Laterality Date  . ANTERIOR CERVICAL DECOMP/DISCECTOMY FUSION  2001  . BACK SURGERY    . BREAST SURGERY     biopsy left breat-benign  . CATARACT EXTRACTION, BILATERAL Bilateral   . CHOLECYSTECTOMY OPEN    . CORONARY ANGIOPLASTY     POBA distal LAD,  . CORONARY STENT INTERVENTION N/A 03/15/2018   Procedure: CORONARY STENT INTERVENTION;  Surgeon: Jettie Booze, MD;  Location: [place] [facility];  Service: Cardiovascular;  Laterality: N/A;  . DILATION AND CURETTAGE OF UTERUS  1960s  . EYE SURGERY Bilateral    "lasers"  . LEFT HEART CATH AND CORONARY ANGIOGRAPHY N/A 03/15/2018   Procedure: LEFT HEART CATH AND CORONARY ANGIOGRAPHY;  Surgeon: Jettie Booze, MD;  Location: [place] [facility];  Service: Cardiovascular;  Laterality: N/A;  . PERIPHERAL VASCULAR INTERVENTION Right 04/2000    iliac stent   . TUBAL LIGATION       OB History   No obstetric history on file.     Family History  Problem Relation Age of Onset  . Heart failure Father   . Heart failure Mother   . Diabetes Mother   . COPD Brother     Social History   Tobacco Use  .  Smoking status: Former Smoker    Packs/day: 0.50    Years: 40.00    Pack years: 20.00    Types: Cigarettes    Start date: 11/30/1961    Quit date: 10/05/1995    Years since quitting: 24.3  . Smokeless tobacco: Never Used  Substance Use Topics  . Alcohol use: Never    Alcohol/week: 0.0 standard drinks  . Drug use: Never    Home Medications Prior to Admission medications   Medication Sig Start Date End Date Taking? Authorizing Provider  amLODipine (NORVASC) 5 MG tablet TAKE 1 TABLET ONCE DAILY. 01/21/20  Yes BranchAlphonse Guild, MD  aspirin 81 MG tablet Take 81 mg by mouth at bedtime.    Yes [provider]  betamethasone dipropionate (DIPROLENE) 0.05 % cream Apply 1 application topically 2 (two) times  daily.   Yes [provider]  bimatoprost (LUMIGAN) 0.01 % SOLN Place 1 drop into both eyes at bedtime.   Yes [provider]  chlorthalidone (HYGROTON) 25 MG tablet TAKE 1/2 TABLET BY MOUTH TWICE WEEKLY ON MONDAYS AND FRIDAYS. 10/08/19  Yes Branch, Alphonse Guild, MD  ezetimibe (ZETIA) 10 MG tablet Take 1 tablet (10 mg total) by mouth daily. 08/24/19 01/28/20 Yes BranchAlphonse Guild, MD  hydrALAZINE (APRESOLINE) 25 MG tablet Take 25 mg by mouth. Take 1/2 tab every morning & 1 tab every evening   Yes [provider]  Icosapent Ethyl (VASCEPA) 1 g CAPS Take 1 capsule (1 g total) by mouth 2 (two) times daily. 08/02/19  Yes BranchAlphonse Guild, MD  insulin glargine (LANTUS) 100 UNIT/ML injection Inject 12-47 Units into the skin See admin instructions. Inject 12 units SQ every morning and inject 47 units SQ every evening around 1600   Yes [provider]  insulin lispro (HUMALOG) 100 UNIT/ML injection Inject 16 Units into the skin 3 (three) times daily with meals.    Yes [provider]  labetalol (NORMODYNE) 200 MG tablet TAKE (1) TABLET TWICE DAILY. 01/08/20  Yes BranchAlphonse Guild, MD  magnesium oxide (MAG-OX) 400 MG tablet Take 400 mg by mouth daily.    Yes [provider]  metFORMIN (GLUCOPHAGE) 1000 MG tablet Take 1,000 mg by mouth as needed. As needed   Yes [provider]  nitroGLYCERIN (NITROSTAT) 0.4 MG SL tablet Place 1 tablet (0.4 mg total) under the tongue every 5 (five) minutes as needed for chest pain. 03/16/18  Yes Daune Perch, NP  oxyCODONE-acetaminophen (PERCOCET) 10-325 MG tablet Take 1 tablet by mouth 2 (two) times daily.   Yes [provider]  SYMBICORT 160-4.5 MCG/ACT inhaler Inhale 2 puffs into the lungs daily.  10/18/13  Yes [provider]    Allergies    Alendronate, Clonidine derivatives, Crestor [rosuvastatin], Lisinopril, Losartan, Codeine, and Hydrocodone  Review of Systems   Review of Systems    Constitutional: Negative for chills and fever.  HENT: Negative for congestion, rhinorrhea and sore throat.   Eyes: Negative for visual disturbance.  Respiratory: Negative for cough and shortness of breath.   Cardiovascular: Positive for chest pain. Negative for leg swelling.  Gastrointestinal: Negative for abdominal pain, diarrhea, nausea and vomiting.  Genitourinary: Negative for dysuria.  Musculoskeletal: Negative for back pain and neck pain.  Skin: Negative for rash.  Neurological: Positive for weakness and numbness. Negative for dizziness, light-headedness and headaches.  Hematological: Does not bruise/bleed easily.  Psychiatric/Behavioral: Negative for confusion.    Physical Exam Updated Vital Signs BP (!) 164/63   Pulse  64   Temp 98.1 F (36.7 C) (Oral)   Resp 19   Ht 1.651 m (5\' 5" )   Wt 78.9 kg   SpO2 97%   BMI 28.96 kg/m   Physical Exam Vitals and nursing note reviewed.  Constitutional:      General: She is not in acute distress.    Appearance: Normal appearance. She is well-developed.  HENT:     Head: Normocephalic and atraumatic.  Eyes:     Extraocular Movements: Extraocular movements intact.     Conjunctiva/sclera: Conjunctivae normal.     Pupils: Pupils are equal, round, and reactive to light.  Cardiovascular:     Rate and Rhythm: Normal rate and regular rhythm.     Heart sounds: No murmur.  Pulmonary:     Effort: Pulmonary effort is normal. No respiratory distress.     Breath sounds: Normal breath sounds.  Chest:     Chest wall: No tenderness.  Abdominal:     Palpations: Abdomen is soft.     Tenderness: There is no abdominal tenderness.  Musculoskeletal:     Cervical back: Normal range of motion and neck supple.  Skin:    General: Skin is warm and dry.     Capillary Refill: Capillary refill takes less than 2 seconds.  Neurological:     Mental Status: She is alert and oriented to person, place, and time.     Sensory: Sensory deficit present.      Motor: Weakness present.     Comments: Patient with sensory deficit to left upper extremity and definitely has weakness to grip and some difficulty holding the arm up against gravity.  Lower extremities seem to be fine right upper extremity fine.     ED Results / Procedures / Treatments   Labs (all labs ordered are listed, but only abnormal results are displayed) Labs Reviewed  URINALYSIS, ROUTINE W REFLEX MICROSCOPIC - Abnormal; Notable for the following components:      Result Value   Color, Urine STRAW (*)    All other components within normal limits  CBG MONITORING, ED - Abnormal; Notable for the following components:   Glucose-Capillary 54 (*)    All other components within normal limits  RESPIRATORY PANEL BY RT PCR (FLU A&B, COVID)  CBC  ETHANOL  PROTIME-INR  APTT  DIFFERENTIAL  COMPREHENSIVE METABOLIC PANEL  RAPID URINE DRUG SCREEN, HOSP PERFORMED  TROPONIN I (HIGH SENSITIVITY)  TROPONIN I (HIGH SENSITIVITY)    EKG EKG Interpretation  Date/Time:  Monday January 28 2020 10:53:17 EDT Ventricular Rate:  61 PR Interval:    QRS Duration: 100 QT Interval:  469 QTC Calculation: 473 R Axis:   72 Text Interpretation: Sinus rhythm Low voltage, precordial leads Borderline T wave abnormalities No significant change since last tracing Confirmed by Fredia Sorrow [phone]) on 01/28/2020 11:01:27 AM   Radiology DG Chest 2 View  Result Date: 01/28/2020 CLINICAL DATA:  Chest pain.  Hypertension. EXAM: CHEST - 2 VIEW COMPARISON:  Chest CT September 24, 2019 FINDINGS: Lungs are clear. Heart size and pulmonary vascularity are normal. No adenopathy. There is aortic atherosclerosis. There old healed rib fractures on the left. Postoperative change noted in the lower cervical spine region. No pneumothorax. IMPRESSION: No edema or airspace opacity. Cardiac silhouette normal. Evidence of old rib trauma on the left. Aortic Atherosclerosis (ICD10-I70.0). Electronically Signed   By: Lowella Grip III M.D.   On: 01/28/2020 12:02   CT HEAD WO CONTRAST  Result Date: 01/28/2020 CLINICAL DATA:  Ataxia.  Hypertension.  Acute onset. EXAM: CT HEAD WITHOUT CONTRAST TECHNIQUE: Contiguous axial images were obtained from the base of the skull through the vertex without intravenous contrast. COMPARISON:  06/17/2016 FINDINGS: Brain: Age related atrophy. Chronic small-vessel ischemic changes of the cerebral hemispheric white matter and basal ganglia regions. No sign of acute infarction, mass lesion, hemorrhage, hydrocephalus or extra-axial collection. Vascular: There is atherosclerotic calcification of the major vessels at the base of the brain. Skull: Negative Sinuses/Orbits: Left maxillary sinusitis. Other sinuses are clear. Orbits negative. Other: None IMPRESSION: No acute intracranial finding. Atrophy and chronic small-vessel ischemic changes. Left maxillary sinusitis. Electronically Signed   By: Nelson Chimes M.D.   On: 01/28/2020 12:59    Procedures Procedures (including critical care time)  CRITICAL CARE Performed by: Fredia Sorrow Total critical care time: 45  minutes Critical care time was exclusive of separately billable procedures and treating other patients. Critical care was necessary to treat or prevent imminent or life-threatening deterioration. Critical care was time spent personally by me on the following activities: development of treatment plan with patient and/or surrogate as well as nursing, discussions with consultants, evaluation of patient's response to treatment, examination of patient, obtaining history from patient or surrogate, ordering and performing treatments and interventions, ordering and review of laboratory studies, ordering and review of radiographic studies, pulse oximetry and re-evaluation of patient's condition.   Medications Ordered in ED Medications  sodium chloride flush (NS) 0.9 % injection 3 mL (3 mLs Intravenous Not Given 01/28/20 1104)  iohexol  (OMNIPAQUE) 350 MG/ML injection 100 mL (75 mLs Intravenous Contrast Given 01/28/20 1540)    ED Course  I have reviewed the triage vital signs and the nursing notes.  Pertinent labs & imaging results that were available during my care of the patient were reviewed by me and considered in my medical decision making (see chart for details).    MDM Rules/Calculators/A&P                     Patient with concern for CVA.  Still has's left upper extremity weakness.  Last normal would have been at 2130 last night.  Patient awoke this morning around 530 noticed that she had numbness and weakness to her left arm.  Also was having some substernal chest pain.  Both troponins have been at 5.  Chest pain lasted until shortly upon arrival here which would have taken it till about 10:30 in the morning.  Chest x-ray without acute findings.  Head CT without acute findings.  Discussed with Dr. Merlene Laughter neurology.  MRI not available here today.  He recommended admission here following CT angio as long as CT angio head neck did not show any significant large vessel blockage.  Which is unlikely based on exam.  Patient troponin is cleared.  Patient's Covid testing was negative.  Final Clinical Impression(s) / ED Diagnoses Final diagnoses:  Precordial pain  Cerebrovascular accident (CVA), unspecified mechanism [facility])    Rx / [place] Orders ED Discharge Orders    None       Fredia Sorrow, MD 01/28/20 [phone]

## 2020-01-28 NOTE — ED Notes (Signed)
Assisted pt to BR  Pt denies pain to left arm but c/o numbness to left arm.

## 2020-01-28 NOTE — ED Provider Notes (Signed)
Blood pressure (!) 164/63, pulse 64, temperature 98.1 F (36.7 C), temperature source Oral, resp. rate 19, height 5\' 5"  (1.651 m), weight 78.9 kg, SpO2 97 %.  Assuming care from Dr. Rogene Houston.  In short, [name] [name] is a 79 y.o. female with a chief complaint of Chest Pain .  Refer to the original H&P for additional details.  The current plan of care is to f/u on CTA head and neck.  4:44 PM CT scan of the head and neck reviewed as interpreted by radiology.  Patient does have several areas of calcification with the maximum being the right common and internal carotid arteries with 50 to 60% stenosis at the origin, no intracranial, large vessel occlusions.  MRI not available at this time.  Dr. Rogene Houston consulted with local neurology, Dr. Merlene Laughter, previously. Plan for [facility] admit.    EKG Interpretation  Date/Time:  Monday January 28 2020 10:53:17 EDT Ventricular Rate:  61 PR Interval:    QRS Duration: 100 QT Interval:  469 QTC Calculation: 473 R Axis:   72 Text Interpretation: Sinus rhythm Low voltage, precordial leads Borderline T wave abnormalities No significant change since last tracing Confirmed by Fredia Sorrow [phone]) on 01/28/2020 11:01:27 AM       Discussed patient's case with [facility] to request admission. Patient and family (if present) updated with plan. Care transferred to [facility] service.  I reviewed all nursing notes, vitals, pertinent old records, EKGs, labs, imaging (as available).     Margette Fast, MD 01/29/20 [phone]

## 2020-01-28 NOTE — ED Notes (Signed)
Pt earrings and necklace put into small plastic bag by Verline Lema NT before taken to Xray.

## 2020-01-28 NOTE — ED Triage Notes (Signed)
Pt brought in by EMS due to CP that began at 0600. Pt reports pain started when she woke up pain left sided and goes into left arm

## 2020-01-29 ENCOUNTER — Observation Stay (HOSPITAL_BASED_OUTPATIENT_CLINIC_OR_DEPARTMENT_OTHER): Payer: Medicare Other

## 2020-01-29 ENCOUNTER — Other Ambulatory Visit (HOSPITAL_COMMUNITY): Payer: Medicare Other

## 2020-01-29 ENCOUNTER — Observation Stay (HOSPITAL_COMMUNITY): Payer: Medicare Other

## 2020-01-29 DIAGNOSIS — I639 Cerebral infarction, unspecified: Secondary | ICD-10-CM

## 2020-01-29 DIAGNOSIS — R0789 Other chest pain: Secondary | ICD-10-CM | POA: Diagnosis present

## 2020-01-29 DIAGNOSIS — I69952 Hemiplegia and hemiparesis following unspecified cerebrovascular disease affecting left dominant side: Secondary | ICD-10-CM | POA: Diagnosis not present

## 2020-01-29 DIAGNOSIS — I6389 Other cerebral infarction: Secondary | ICD-10-CM

## 2020-01-29 DIAGNOSIS — E162 Hypoglycemia, unspecified: Secondary | ICD-10-CM | POA: Diagnosis present

## 2020-01-29 LAB — GLUCOSE, CAPILLARY
Glucose-Capillary: 296 mg/dL — ABNORMAL HIGH (ref 70–99)
Glucose-Capillary: 349 mg/dL — ABNORMAL HIGH (ref 70–99)
Glucose-Capillary: 300 mg/dL — ABNORMAL HIGH (ref 70–99)

## 2020-01-29 LAB — LIPID PANEL
Cholesterol: 235 mg/dL — ABNORMAL HIGH (ref 0–200)
HDL: 37 mg/dL — ABNORMAL LOW (ref >40)
LDL Cholesterol: 153 mg/dL — ABNORMAL HIGH (ref 0–99)
Total CHOL/HDL Ratio: 6.4 RATIO
Triglycerides: 225 mg/dL — ABNORMAL HIGH (ref <150)
VLDL: 45 mg/dL — ABNORMAL HIGH (ref 0–40)

## 2020-01-29 LAB — CBC
HCT: 39.2 % (ref 36.0–46.0)
Hemoglobin: 12.7 g/dL (ref 12.0–15.0)
MCH: 29.1 pg (ref 26.0–34.0)
MCHC: 32.4 g/dL (ref 30.0–36.0)
MCV: 89.7 fL (ref 80.0–100.0)
Platelets: 271 10*3/uL (ref 150–400)
RBC: 4.37 MIL/uL (ref 3.87–5.11)
RDW: 13 % (ref 11.5–15.5)
WBC: 5.6 10*3/uL (ref 4.0–10.5)
nRBC: 0 % (ref 0.0–0.2)

## 2020-01-29 LAB — HEMOGLOBIN A1C
Hgb A1c MFr Bld: 8.4 % — ABNORMAL HIGH (ref 4.8–5.6)
Mean Plasma Glucose: 194.38 mg/dL

## 2020-01-29 LAB — MAGNESIUM: Magnesium: 1.9 mg/dL (ref 1.7–2.4)

## 2020-01-29 LAB — BASIC METABOLIC PANEL
Anion gap: 10 (ref 5–15)
BUN: 14 mg/dL (ref 8–23)
CO2: 25 mmol/L (ref 22–32)
Calcium: 8.9 mg/dL (ref 8.9–10.3)
Chloride: 99 mmol/L (ref 98–111)
Creatinine, Ser: 0.71 mg/dL (ref 0.44–1.00)
GFR calc Af Amer: >60 mL/min (ref >60)
GFR calc non Af Amer: >60 mL/min (ref >60)
Glucose, Bld: 306 mg/dL — ABNORMAL HIGH (ref 70–99)
Potassium: 3.6 mmol/L (ref 3.5–5.1)
Sodium: 134 mmol/L — ABNORMAL LOW (ref 135–145)

## 2020-01-29 MED ORDER — CLOPIDOGREL BISULFATE 75 MG PO TABS
75.0000 mg | ORAL_TABLET | Freq: Every day | ORAL | 11 refills | Status: DC
Start: 2020-01-29 — End: 2020-11-14

## 2020-01-29 MED ORDER — ASPIRIN 81 MG PO TBEC: 81.0000 mg | DELAYED_RELEASE_TABLET | Freq: Every day | ORAL | 0 refills | Status: AC

## 2020-01-29 NOTE — Consult Note (Signed)
[name] A. Merlene Laughter, MD     [email or web address]          [name] [name] is an 79 y.o. female.   ASSESSMENT/PLAN: ACUTE ISCHEMIC STROKE INVOLVING THE RIGHT PARIETAL REGION: THIS IS A LACUNAR EVENT. Risk factors include age, hypertension, diabetes and the previous vascular events. Dual antiplatelet agents are recommended for 1 month. Subsequently, Plavix is recommended. It seems the patient may have had issues with statins in the past. It is worthwhile trying a low impact statin such as pravastatin. Continue with blood pressure and blood sugar control.    The patient is a 79 year old right-handed white female who presents with the acute onset of numbness involving the left upper extremity a few days ago. She reports this happening about Sunday but did not seek immediate medical attention. The following day she noticed the symptoms persisting and the involving to have a marked weakness of the left upper extremity. She does not report having symptoms involving the legs. She denies dysarthria dysphagia. She has had previous coronary events but denies any chest pain or shortness of breath. She has been on aspirin 81 mg and has been compliant with this. Since being hospitalized, she reports significant improvement in the left upper extremity. The review systems otherwise negative.   GENERAL: This is a pleasant female who is doing well at this time in no acute distress.  HEENT: Neck is supple no trauma is noted.  ABDOMEN: Soft  EXTREMITIES: No edema   BACK: Normal alignment.  SKIN: Normal by inspection.    MENTAL STATUS: Alert and oriented. Speech, language and cognition are generally intact. Judgment and insight normal.   CRANIAL NERVES: Pupils are equal, round and reactive to light and accommodation; extraocular movements are full, there is no significant nystagmus; upper and lower facial muscles are normal in strength and symmetric, there is no flattening of the  nasolabial folds; tongue is midline; uvula is midline; shoulder elevation is normal.  MOTOR: Normal tone, bulk and strength; there is subtle pronator drift and down drift involving the left upper extremity.  COORDINATION: Left finger to nose is normal, right finger to nose is normal, No rest tremor; no intention tremor; no postural tremor; no bradykinesia.  REFLEXES: Deep tendon reflexes are symmetrical and normal.   SENSATION: Mildly impaired sensory modality light touch and temperature involving the left hand. The other extremities are normal  NIH stroke scale of 1, 1 total 2.    Blood pressure (!) 157/61, pulse (!) 58, temperature 98.6 F (37 C), temperature source Oral, resp. rate 16, height 5\' 5"  (1.651 m), weight 77.9 kg, SpO2 95 %.  Past Medical History:  Diagnosis Date  . Anxiety   . Arthritis    "neck" (03/15/2018)  . CAD (coronary artery disease)    Status post POBA distal LAD, status post Cardiolite Myoview 2008 negative for ischemia  . COPD (chronic obstructive pulmonary disease) ([place])   . Depression   . GERD (gastroesophageal reflux disease)   . Hypercholesterolemia   . Hypertension   . Peripheral vascular disease ([place])     status post iliac stent July 2001   . Type II diabetes mellitus ([place])     Past Surgical History:  Procedure Laterality Date  . ANTERIOR CERVICAL DECOMP/DISCECTOMY FUSION  2001  . BACK SURGERY    . BREAST SURGERY     biopsy left breat-benign  . CATARACT EXTRACTION, BILATERAL Bilateral   . CHOLECYSTECTOMY OPEN    . CORONARY ANGIOPLASTY  POBA distal LAD,  . CORONARY STENT INTERVENTION N/A 03/15/2018   Procedure: CORONARY STENT INTERVENTION;  Surgeon: Jettie Booze, MD;  Location: [facility];  Service: Cardiovascular;  Laterality: N/A;  . DILATION AND CURETTAGE OF UTERUS  1960s  . EYE SURGERY Bilateral    "lasers"  . LEFT HEART CATH AND CORONARY ANGIOGRAPHY N/A 03/15/2018   Procedure: LEFT HEART CATH AND CORONARY ANGIOGRAPHY;   Surgeon: Jettie Booze, MD;  Location: [facility];  Service: Cardiovascular;  Laterality: N/A;  . PERIPHERAL VASCULAR INTERVENTION Right 04/2000    iliac stent   . TUBAL LIGATION      Family History  Problem Relation Age of Onset  . Heart failure Father   . Heart failure Mother   . Diabetes Mother   . COPD Brother     Social History:  reports that she quit smoking about 24 years ago. Her smoking use included cigarettes. She started smoking about 58 years ago. She has a 20.00 pack-year smoking history. She has never used smokeless tobacco. She reports that she does not drink alcohol or use drugs.  Allergies:  Allergies  Allergen Reactions  . Alendronate     Other reaction(s): Other (See Comments) Leg edema  . Clonidine Derivatives Other (See Comments)    dizziness  . Crestor [Rosuvastatin]     Muscle cramps  . Lisinopril Other (See Comments)    Tongue swelling  . Losartan Other (See Comments)    TONGUE SWELLING  . Codeine Nausea And Vomiting  . Hydrocodone Nausea And Vomiting    Medications: Prior to Admission medications   Medication Sig Start Date End Date Taking? Authorizing Provider  amLODipine (NORVASC) 5 MG tablet TAKE 1 TABLET ONCE DAILY. 01/21/20  Yes BranchAlphonse Guild, MD  aspirin 81 MG tablet Take 81 mg by mouth at bedtime.    Yes [provider]  betamethasone dipropionate (DIPROLENE) 0.05 % cream Apply 1 application topically 2 (two) times daily.   Yes [provider]  bimatoprost (LUMIGAN) 0.01 % SOLN Place 1 drop into both eyes at bedtime.   Yes [provider]  chlorthalidone (HYGROTON) 25 MG tablet TAKE 1/2 TABLET BY MOUTH TWICE WEEKLY ON MONDAYS AND FRIDAYS. 10/08/19  Yes Branch, Alphonse Guild, MD  ezetimibe (ZETIA) 10 MG tablet Take 1 tablet (10 mg total) by mouth daily. 08/24/19 01/28/20 Yes BranchAlphonse Guild, MD  hydrALAZINE (APRESOLINE) 25 MG tablet Take 25 mg by mouth. Take 1/2 tab every morning & 1 tab every  evening   Yes [provider]  Icosapent Ethyl (VASCEPA) 1 g CAPS Take 1 capsule (1 g total) by mouth 2 (two) times daily. 08/02/19  Yes BranchAlphonse Guild, MD  insulin glargine (LANTUS) 100 UNIT/ML injection Inject 12-47 Units into the skin See admin instructions. Inject 12 units SQ every morning and inject 47 units SQ every evening around 1600   Yes [provider]  insulin lispro (HUMALOG) 100 UNIT/ML injection Inject 16 Units into the skin 3 (three) times daily with meals.    Yes [provider]  labetalol (NORMODYNE) 200 MG tablet TAKE (1) TABLET TWICE DAILY. 01/08/20  Yes BranchAlphonse Guild, MD  magnesium oxide (MAG-OX) 400 MG tablet Take 400 mg by mouth daily.    Yes [provider]  metFORMIN (GLUCOPHAGE) 1000 MG tablet Take 1,000 mg by mouth as needed. As needed   Yes [provider]  nitroGLYCERIN (NITROSTAT) 0.4 MG SL tablet Place 1 tablet (0.4 mg  total) under the tongue every 5 (five) minutes as needed for chest pain. 03/16/18  Yes Daune Perch, NP  oxyCODONE-acetaminophen (PERCOCET) 10-325 MG tablet Take 1 tablet by mouth 2 (two) times daily.   Yes [provider]  SYMBICORT 160-4.5 MCG/ACT inhaler Inhale 2 puffs into the lungs daily.  10/18/13  Yes [provider]    Scheduled Meds: . aspirin EC  81 mg Oral QHS  . ezetimibe  10 mg Oral Daily  . fluticasone furoate-vilanterol  1 puff Inhalation Daily  . insulin aspart  0-9 Units Subcutaneous TID WC  . insulin glargine  10 Units Subcutaneous QHS  . latanoprost  1 drop Both Eyes QHS  . sodium chloride flush  3 mL Intravenous Once  . sodium chloride flush  3 mL Intravenous Q12H   Continuous Infusions: PRN Meds:.acetaminophen **OR** acetaminophen, albuterol, hydrALAZINE, nitroGLYCERIN     Results for orders placed or performed during the hospital encounter of 01/28/20 (from the past 48 hour(s))  Urine rapid drug screen (hosp performed)     Status: None   Collection  Time: 01/28/20 11:49 AM  Result Value Ref Range   Opiates NONE DETECTED NONE DETECTED   Cocaine NONE DETECTED NONE DETECTED   Benzodiazepines NONE DETECTED NONE DETECTED   Amphetamines NONE DETECTED NONE DETECTED   Tetrahydrocannabinol NONE DETECTED NONE DETECTED   Barbiturates NONE DETECTED NONE DETECTED    Comment: (NOTE) DRUG SCREEN FOR MEDICAL PURPOSES ONLY.  IF CONFIRMATION IS NEEDED FOR ANY PURPOSE, NOTIFY LAB WITHIN 5 DAYS. LOWEST DETECTABLE LIMITS FOR URINE DRUG SCREEN Drug Class                     Cutoff (ng/mL) Amphetamine and metabolites    1000 Barbiturate and metabolites    200 Benzodiazepine                 [id number] Tricyclics and metabolites     300 Opiates and metabolites        300 Cocaine and metabolites        300 THC                            50 Performed at [facility], [address] [place], [place] [address]   Urinalysis, Routine w reflex microscopic     Status: Abnormal   Collection Time: 01/28/20 11:49 AM  Result Value Ref Range   Color, Urine STRAW (A) YELLOW   APPearance CLEAR CLEAR   Specific Gravity, Urine 1.005 1.005 - 1.030   pH 8.0 5.0 - 8.0   Glucose, UA NEGATIVE NEGATIVE mg/dL   Hgb urine dipstick NEGATIVE NEGATIVE   Bilirubin Urine NEGATIVE NEGATIVE   Ketones, ur NEGATIVE NEGATIVE mg/dL   Protein, ur NEGATIVE NEGATIVE mg/dL   Nitrite NEGATIVE NEGATIVE   Leukocytes,Ua NEGATIVE NEGATIVE    Comment: Performed at [facility], [address] [place],  [address]  CBC     Status: None   Collection Time: 01/28/20 12:25 PM  Result Value Ref Range   WBC 7.0 4.0 - 10.5 K/uL   RBC 4.50 3.87 - 5.11 MIL/uL   Hemoglobin 13.4 12.0 - 15.0 g/dL   HCT 40.2 36.0 - 46.0 %   MCV 89.3 80.0 - 100.0 fL   MCH 29.8 26.0 - 34.0 pg   MCHC 33.3 30.0 - 36.0 g/dL   RDW 12.8 11.5 - 15.5 %   Platelets 277 150 - 400 K/uL  nRBC 0.0 0.0 - 0.2 %    Comment: Performed at [facility], [address] [place], [place] [address]  Ethanol     Status: None    Collection Time: 01/28/20 12:25 PM  Result Value Ref Range   Alcohol, Ethyl (B) <10 <10 mg/dL    Comment: (NOTE) Lowest detectable limit for serum alcohol is 10 mg/dL. For medical purposes only. Performed at [facility], [address] [place], [place] [address]   Protime-INR     Status: None   Collection Time: 01/28/20 12:25 PM  Result Value Ref Range   Prothrombin Time 13.7 11.4 - 15.2 seconds   INR 1.1 0.8 - 1.2    Comment: (NOTE) INR goal varies based on device and disease states. Performed at [facility], [address] [place], [place] [address]   APTT     Status: None   Collection Time: 01/28/20 12:25 PM  Result Value Ref Range   aPTT 25 24 - 36 seconds    Comment: Performed at [facility], [address], [place], [place] [address]  Differential     Status: None   Collection Time: 01/28/20 12:25 PM  Result Value Ref Range   Neutrophils Relative % 60 %   Neutro Abs 4.1 1.7 - 7.7 K/uL   Lymphocytes Relative 26 %   Lymphs Abs 1.7 0.7 - 4.0 K/uL   Monocytes Relative 8 %   Monocytes Absolute 0.5 0.1 - 1.0 K/uL   Eosinophils Relative 5 %   Eosinophils Absolute 0.3 0.0 - 0.5 K/uL   Basophils Relative 1 %   Basophils Absolute 0.1 0.0 - 0.1 K/uL   Immature Granulocytes 0 %   Abs Immature Granulocytes 0.01 0.00 - 0.07 K/uL    Comment: Performed at [facility], [address] [place], [place] [address]  Comprehensive metabolic panel     Status: None   Collection Time: 01/28/20 12:25 PM  Result Value Ref Range   Sodium 138 135 - 145 mmol/L   Potassium 4.0 3.5 - 5.1 mmol/L   Chloride 100 98 - 111 mmol/L   CO2 28 22 - 32 mmol/L   Glucose, Bld 93 70 - 99 mg/dL    Comment: Glucose reference range applies only to samples taken after fasting for at least 8 hours.   BUN 11 8 - 23 mg/dL   Creatinine, Ser 0.63 0.44 - 1.00 mg/dL   Calcium 9.5 8.9 - 10.3 mg/dL   Total Protein 6.9 6.5 - 8.1 g/dL   Albumin 3.9 3.5 - 5.0 g/dL   AST 19 15 - 41 U/L   ALT 20 0 - 44 U/L   Alkaline  Phosphatase 109 38 - 126 U/L   Total Bilirubin 0.8 0.3 - 1.2 mg/dL   GFR calc non Af Amer >60 >60 mL/min   GFR calc Af Amer >60 >60 mL/min   Anion gap 10 5 - 15    Comment: Performed at [facility], [address] [place], [place] [address]  Hemoglobin A1c     Status: Abnormal   Collection Time: 01/28/20 12:25 PM  Result Value Ref Range   Hgb A1c MFr Bld 8.4 (H) 4.8 - 5.6 %    Comment: (NOTE) Pre diabetes:          5.7%-6.4% Diabetes:              >6.4% Glycemic control for   <7.0% adults with diabetes    Mean Plasma Glucose 194.38 mg/dL    Comment: Performed  at [facility], [place] [address] [place], [place] [address]  Troponin I (High Sensitivity)     Status: None   Collection Time: 01/28/20 12:26 PM  Result Value Ref Range   Troponin I (High Sensitivity) 5 <18 ng/L    Comment: (NOTE) Elevated high sensitivity troponin I (hsTnI) values and significant  changes across serial measurements may suggest ACS but many other  chronic and acute conditions are known to elevate hsTnI results.  Refer to the "Links" section for chest pain algorithms and additional  guidance. Performed at [facility], [address], [place], [place] [address]   CBG monitoring, ED     Status: Abnormal   Collection Time: 01/28/20  2:27 PM  Result Value Ref Range   Glucose-Capillary 54 (L) 70 - 99 mg/dL    Comment: Glucose reference range applies only to samples taken after fasting for at least 8 hours.  Respiratory Panel by RT PCR (Flu A&B, Covid) - Nasopharyngeal Swab     Status: None   Collection Time: 01/28/20  2:48 PM   Specimen: Nasopharyngeal Swab  Result Value Ref Range   SARS Coronavirus 2 by RT PCR NEGATIVE NEGATIVE    Comment: (NOTE) SARS-CoV-2 target nucleic acids are NOT DETECTED. The SARS-CoV-2 RNA is generally detectable in upper respiratoy specimens during the acute phase of infection. The lowest concentration of SARS-CoV-2 viral copies this assay can detect is 131 copies/mL. A  negative result does not preclude SARS-Cov-2 infection and should not be used as the sole basis for treatment or other patient management decisions. A negative result may occur with  improper specimen collection/handling, submission of specimen other than nasopharyngeal swab, presence of viral mutation(s) within the areas targeted by this assay, and inadequate number of viral copies (<131 copies/mL). A negative result must be combined with clinical observations, patient history, and epidemiological information. The expected result is Negative. Fact Sheet for Patients:  PinkCheek.be Fact Sheet for Healthcare Providers:  GravelBags.it This test is not yet ap proved or cleared by the [place] FDA and  has been authorized for detection and/or diagnosis of SARS-CoV-2 by FDA under an Emergency Use Authorization (EUA). This EUA will remain  in effect (meaning this test can be used) for the duration of the COVID-19 declaration under Section 564(b)(1) of the Act, 21 U.S.C. section 360bbb-3(b)(1), unless the authorization is terminated or revoked sooner.    Influenza A by PCR NEGATIVE NEGATIVE   Influenza B by PCR NEGATIVE NEGATIVE    Comment: (NOTE) The Xpert Xpress SARS-CoV-2/FLU/RSV assay is intended as an aid in  the diagnosis of influenza from Nasopharyngeal swab specimens and  should not be used as a sole basis for treatment. Nasal washings and  aspirates are unacceptable for Xpert Xpress SARS-CoV-2/FLU/RSV  testing. Fact Sheet for Patients: PinkCheek.be Fact Sheet for Healthcare Providers: GravelBags.it This test is not yet approved or cleared by the [place] FDA and  has been authorized for detection and/or diagnosis of SARS-CoV-2 by  FDA under an Emergency Use Authorization (EUA). This EUA will remain  in effect (meaning this test can be used) for the duration  of the  Covid-19 declaration under Section 564(b)(1) of the Act, 21  U.S.C. section 360bbb-3(b)(1), unless the authorization is  terminated or revoked. Performed at [facility], [address], [place], [place] [address]   Troponin I (High Sensitivity)     Status: None   Collection Time: 01/28/20  2:48 PM  Result Value Ref Range   Troponin  I (High Sensitivity) 5 <18 ng/L    Comment: (NOTE) Elevated high sensitivity troponin I (hsTnI) values and significant  changes across serial measurements may suggest ACS but many other  chronic and acute conditions are known to elevate hsTnI results.  Refer to the "Links" section for chest pain algorithms and additional  guidance. Performed at [facility], [address], [place], [place] [address]   Troponin I (High Sensitivity)     Status: None   Collection Time: 01/28/20  4:21 PM  Result Value Ref Range   Troponin I (High Sensitivity) 5 <18 ng/L    Comment: (NOTE) Elevated high sensitivity troponin I (hsTnI) values and significant  changes across serial measurements may suggest ACS but many other  chronic and acute conditions are known to elevate hsTnI results.  Refer to the "Links" section for chest pain algorithms and additional  guidance. Performed at [facility], [address], [place], [place] [address]   Magnesium     Status: None   Collection Time: 01/28/20  4:21 PM  Result Value Ref Range   Magnesium 1.9 1.7 - 2.4 mg/dL    Comment: Performed at [facility], [address] [place] [address], [place], [place] [address]  Glucose, capillary     Status: Abnormal   Collection Time: 01/28/20  9:07 PM  Result Value Ref Range   Glucose-Capillary 233 (H) 70 - 99 mg/dL    Comment: Glucose reference range applies only to samples taken after fasting for at least 8 hours.   Comment 1 Notify RN    Comment 2 Document in Chart   Magnesium     Status: None   Collection Time: 01/29/20  6:03 AM  Result Value Ref Range   Magnesium 1.9 1.7 - 2.4 mg/dL     Comment: Performed at [facility] [address], [place], [place] [id number]  Basic metabolic panel     Status: Abnormal   Collection Time: 01/29/20  6:03 AM  Result Value Ref Range   Sodium 134 (L) 135 - 145 mmol/L   Potassium 3.6 3.5 - 5.1 mmol/L   Chloride 99 98 - 111 mmol/L   CO2 25 22 - 32 mmol/L   Glucose, Bld 306 (H) 70 - 99 mg/dL    Comment: Glucose reference range applies only to samples taken after fasting for at least 8 hours.   BUN 14 8 - 23 mg/dL   Creatinine, Ser 0.71 0.44 - 1.00 mg/dL   Calcium 8.9 8.9 - 10.3 mg/dL   GFR calc non Af Amer >60 >60 mL/min   GFR calc Af Amer >60 >60 mL/min   Anion gap 10 5 - 15    Comment: Performed at [facility], [address] [place], [place] [address]  CBC     Status: None   Collection Time: 01/29/20  6:03 AM  Result Value Ref Range   WBC 5.6 4.0 - 10.5 K/uL   RBC 4.37 3.87 - 5.11 MIL/uL   Hemoglobin 12.7 12.0 - 15.0 g/dL   HCT 39.2 36.0 - 46.0 %   MCV 89.7 80.0 - 100.0 fL   MCH 29.1 26.0 - 34.0 pg   MCHC 32.4 30.0 - 36.0 g/dL   RDW 13.0 11.5 - 15.5 %   Platelets 271 150 - 400 K/uL   nRBC 0.0 0.0 - 0.2 %    Comment: Performed at [facility], [address], [place], [place] [address]  Lipid panel     Status: Abnormal   Collection Time: 01/29/20  6:03 AM  Result Value Ref Range   Cholesterol 235 (H) 0 - 200 mg/dL   Triglycerides 225 (H) <150 mg/dL   HDL 37 (L) >40 mg/dL   Total CHOL/HDL Ratio 6.4 RATIO   VLDL 45 (H) 0 - 40 mg/dL   LDL Cholesterol 153 (H) 0 - 99 mg/dL    Comment:        Total Cholesterol/HDL:CHD Risk Coronary Heart Disease Risk Table                     Men   Women  1/2 Average Risk   3.4   3.3  Average Risk       5.0   4.4  2 X Average Risk   9.6   7.1  3 X Average Risk  23.4   11.0        Use the calculated Patient Ratio above and the CHD Risk Table to determine the patient's CHD Risk.        ATP III CLASSIFICATION (LDL):  <100     mg/dL   Optimal  100-129  mg/dL   Near or Above                     Optimal  130-159  mg/dL   Borderline  160-189  mg/dL   High  >190     mg/dL   Very High Performed at [facility], [address] [place], [place] [address]   Glucose, capillary     Status: Abnormal   Collection Time: 01/29/20  7:32 AM  Result Value Ref Range   Glucose-Capillary 296 (H) 70 - 99 mg/dL    Comment: Glucose reference range applies only to samples taken after fasting for at least 8 hours.  Glucose, capillary     Status: Abnormal   Collection Time: 01/29/20  1:48 PM  Result Value Ref Range   Glucose-Capillary 349 (H) 70 - 99 mg/dL    Comment: Glucose reference range applies only to samples taken after fasting for at least 8 hours.    Studies/Results:  BRAIN MRI MRA FINDINGS: MRI HEAD FINDINGS  Brain: There is a 10 mm focus of restricted diffusion within the right centrum semiovale.  There is no evidence of intracranial hemorrhage. Prominence of the ventricles and sulci reflects generalized parenchymal volume loss. Patchy and confluent areas of T2 hyperintensity in the supratentorial white matter are nonspecific but probably reflect mild to moderate chronic microvascular ischemic changes. 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  Vascular: Major vessel flow voids at the skull base are preserved.  Skull and upper cervical spine: Marrow signal is within normal limits.  Sinuses/Orbits: Paranasal sinus mucosal thickening, greatest within the left maxillary sinus. Bilateral lens replacements.  Other: Mastoid air cells are clear.  [place] is unremarkable.  MRA HEAD FINDINGS  Intracranial internal carotid arteries are patent with atherosclerotic irregularity. Middle and anterior cerebral arteries are patent. Intracranial vertebral arteries, basilar artery, posterior cerebral arteries are patent. There is no significant stenosis or aneurysm.  IMPRESSION: Small acute infarct of the right frontal central white matter. Chronic  microvascular ischemic changes.  No proximal intracranial vessel occlusion or significant stenosis.    HEAD NECK CTA IMPRESSION: CT head:  1. No evidence of acute intracranial abnormality. 2. Stable mild generalized parenchymal atrophy and chronic small vessel ischemic disease with small chronic left basal ganglia lacunar infarct. 3. Moderate left maxillary sinus mucosal thickening.  CTA neck:  1. The right common and internal carotid arteries are patent within the neck. Calcified plaque results in 50-60% stenosis at the origin of the right ICA. Additional moderate mixed plaque more distally within the proximal right ICA without significant stenosis at this site. 2. The left common carotid, left internal carotid and bilateral vertebral arteries are patent within the neck without significant stenosis.  CTA head:  1. No intracranial large vessel occlusion or proximal high-grade arterial stenosis. 2. Calcified plaque within the intracranial ICAs with sites of mild-to-moderate stenosis bilaterally.    BRAIN MRI SCAN IS REVIEWED IN PERSON. There is an acute small infarct involving the deep right parietal region. Seems most consistent with a lacunar type event. There are  Few areas of encephalomalacia also consistent with infarct but these are remote. There is evidence of remote infarct involving the right anterior centrum semiovale, left interior semiovale, left thalamic area and the right basal ganglia. 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 No hemorrhages appreciated.    Nikolaj Geraghty A. Merlene [name], M.D.  Diplomate, Tax adviser of Psychiatry and Neurology ( Neurology). 01/29/2020, 2:13 PM

## 2020-01-29 NOTE — Evaluation (Signed)
Occupational Therapy Evaluation Patient Details Name: [name] [name] MRN: [id number] DOB: 1941/02/21 Today's Date: 01/29/2020    History of Present Illness [name] [name] is a 79 y.o. 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 MRI pending   Clinical Impression   Pt agreeable to OT evaluation this am, reports continued improvement in LUE sensation and functioning. Pt with LUE strength WFL (equivalent to RUE), sensation and coordination are intact. Pt reports feeling of heaviness in left hand, like hand is asleep, however is able to use hand functionally and light touch is intact. Pt performing ADLs independently. No further OT services required at this time as pt is at baseline with her ADLs.     Follow Up Recommendations  No OT follow up    Equipment Recommendations  None recommended by OT       Precautions / Restrictions Precautions Precautions: None Restrictions Weight Bearing Restrictions: No      Mobility Bed Mobility Overal bed mobility: Modified Independent                Transfers Overall transfer level: Modified independent Equipment used: None                      ADL either performed or assessed with clinical judgement   ADL Overall ADL's : Modified independent     Grooming: Wash/dry hands;Independent;Standing           Upper Body Dressing : Modified independent;Standing Upper Body Dressing Details (indicate cue type and reason): changed gowns without difficulty     Toilet Transfer: Modified Independent;Ambulation           Functional mobility during ADLs: Modified independent       Vision  Baseline Vision/History: Wears glasses Wears Glasses: At all times Patient Visual Report: No change from baseline Vision Assessment?: No apparent visual deficits            Pertinent Vitals/Pain Pain Assessment: No/denies pain     Hand Dominance Right   Extremity/Trunk Assessment Upper Extremity Assessment Upper Extremity Assessment: Overall WFL for tasks assessed(reports left hand feels heavy or asleep)   Lower Extremity Assessment Lower Extremity Assessment: Defer to PT evaluation   Cervical / Trunk Assessment Cervical / Trunk Assessment: Normal   Communication Communication Communication: No difficulties   Cognition Arousal/Alertness: Awake/alert Behavior During Therapy: WFL for tasks assessed/performed Overall Cognitive Status: Within Functional Limits for tasks assessed                                                Home Living Family/patient expects to be discharged to:: Private residence Living Arrangements: Alone Available Help at Discharge: Family;Available PRN/intermittently Type of Home: Apartment Home Access: Level entry     Home Layout: One level     Bathroom Shower/Tub: Teacher, early years/pre: Standard     Home Equipment: Walker - 2 wheels;Grab bars - tub/shower          Prior Functioning/Environment Level of Independence: Independent        Comments: Independent in ADLs and mobility,  drives        OT Problem List: Decreased activity tolerance       AM-PAC OT "6 Clicks" Daily Activity     Outcome Measure Help from another person eating meals?: None Help from another person taking care of personal grooming?: None Help from another person toileting, which includes using toliet, bedpan, or urinal?: None Help from another person bathing (including washing, rinsing, drying)?: None Help from another person to put on and taking off regular upper body clothing?: None Help from another person to put on and  taking off regular lower body clothing?: None 6 Click Score: 24   End of Session    Activity Tolerance: Patient tolerated treatment well Patient left: in chair;with call bell/phone within reach;with SCD's reapplied(RT in room)  OT Visit Diagnosis: Muscle weakness (generalized) (M62.81)                Time: [id number] OT Time Calculation (min): 14 min Charges:  OT General Charges $OT Visit: 1 Visit OT Evaluation $OT Eval Low Complexity: [place], OTR/L  [phone] 01/29/2020, 8:00 AM

## 2020-01-29 NOTE — Progress Notes (Signed)
Inpatient Diabetes Program Recommendations  AACE/ADA: New Consensus Statement on Inpatient Glycemic Control (2015)  Target Ranges:  Prepandial:   less than 140 mg/dL      Peak postprandial:   less than 180 mg/dL (1-2 hours)      Critically ill patients:  140 - 180 mg/dL   Lab Results  Component Value Date   GLUCAP 296 (H) 01/29/2020   HGBA1C 8.4 (H) 01/28/2020    Review of Glycemic Control  Results for [name] [name], [name] [name] (MRN [id number]) as of 01/29/2020 13:31  Ref. Range 01/28/2020 14:27 01/28/2020 21:07 01/29/2020 07:32  Glucose-Capillary Latest Ref Range: 70 - 99 mg/dL 54 (L) 233 (H) 296 (H)   Diabetes history: DM2  Outpatient Diabetes medications: Lantus 47 units qam, Lantus 12 units qpm, Humalog 16 units tid, Metformin 1000 mg qd  Current orders for Inpatient glycemic control: Novolog 0-9 units TID + Lantus 10 units qd  Inpatient Diabetes Program Recommendations:     Please consider Lantus 30 units daily (50% of home dose)  Thank you, Reche Dixon, RN, BSN Diabetes Coordinator Inpatient Diabetes Program [phone] (team pager from 8a-5p)

## 2020-01-29 NOTE — Discharge Summary (Signed)
Physician Discharge Summary  [name] [name] [id number] DOB: 10-08-1940 DOA: 01/28/2020  PCP: Arsenio Katz, NP  Admit date: 01/28/2020  Discharge date: 01/29/2020  Admitted From:Home  Disposition:  Home  Recommendations for Outpatient Follow-up:  1. Follow up with PCP in 1-2 weeks 2. Follow-up with neurology Dr. Merlene Laughter in 4 weeks 3. Remain on aspirin and Plavix for 4 weeks then Plavix only thereafter as recommended 4. Continue other home medications as prior  Home Health: None recommended by PT  Equipment/Devices: None  Discharge Condition: Stable  CODE STATUS: Full  Diet recommendation: Heart Healthy/carb modified  Brief/Interim Summary: Per HPI: [name] [name] is a 79 y.o. female with medical history significant for type 2 diabetes mellitus, hypertension, hyperlipidemia, anxiety, COPD, coronary artery disease status post PCI with stent placement in June 2019, who is admitted to [facility] on 01/28/2020 with suspected acute ischemic CVA after presenting from home to [facility] Emergency Department complaining of left upper extremity weakness.   The patient reports that she went to sleep at approximately 2130 on 01/27/2020 in the absence of any acute focal neurologic deficits before waking up around 0500 on the morning of 01/28/2020 with new onset left upper extremity weakness.  She denies any associated paresthesias, numbness, dysphagia, dizziness, vertigo, nausea, vomiting, change in vision, blurry vision, diplopia, word finding difficulties, headaches, facial droop, or slurring of speech.   Denies any previous known history of stroke.  In terms of modifiable risk factors, the patient acknowledges a history of hypertension, hyperlipidemia, type 2 diabetes mellitus.  She also reports that she is a former smoker, having previously smoked half pack per day for 40 years before completely quitting smoking greater than 20 years ago, without any subsequent resumption.  She  denies any known history of underlying atrial fibrillation.  She reports good compliance with her outpatient antihypertensive regimen as well as her outpatient Zetia.  She reports that she had previously briefly taken rosuvastatin as an outpatient, but that this medication was subsequently discontinued in the setting of perceived muscle cramps as a result of this statin.  Consequently, she is not currently on a statin. the patient also acknowledges good compliance with her outpatient insulin regimen, which consists of Lantus 12 units subcu every morning as well as 47 units subcu nightly in addition to scheduled Humalog 16 units 3 times daily with meals.  She is also on Metformin.  Per chart review, it appears that most recent hemoglobin A1c was 8.9% when checked in April 2017.  In the setting of a history of coronary artery disease, the patient is on a daily baby aspirin.  She denies taking any other blood thinning agents at home.   Upon awaking around 0500 on the morning of 01/28/2020 with new onset left upper extremity weakness, the patient reports that she then developed sharp, nonradiating, substernal chest discomfort while at rest.  She reports that this discomfort was nonexertional, and resolved within 4 to 5 hours without interval use of nitroglycerin.  Denies any subsequent chest discomfort. denies any associated palpitations, diaphoresis, nausea, vomiting, shortness of breath, presyncope, or syncope.  Per chart review, cardiac history notable for the following: The patient had previously undergone angioplasty to the distal LAD, followed by adenosine stress Myoview in 2008, which reportedly demonstrated no evidence of reversible ischemia.  Subsequently, she underwent PCI with stent placement in June 2019.   The patient denies any recent subjective fever, chills, rigors, or generalized myalgias. Denies any recent neck stiffness, rhinitis, rhinorrhea, sore  throat, cough, abdominal pain, diarrhea, or  rash. No recent traveling or known COVID-19 exposures. Denies dysuria, gross hematuria, or change in urinary urgency/frequency.    She reports that her left upper extremity weakness has continued to improve following onset, although strength in the left upper extremity has not yet returned to baseline.  4/27: Patient had brain MRI demonstrating acute right frontal CVA.  She had 2D echocardiogram with LVEF 65-70% and no evidence of PFO noted.  She has been seen by neurology with recommendations to remain on dual antiplatelet therapy with aspirin and Plavix for 4 weeks and then Plavix only thereafter.  She is to follow-up with neurology in 4 weeks in the outpatient setting.  Her left arm weakness has improved considerably and she has no further significant symptomatology.  Her LDL is 153 and she is to continue her home ezetimibe and fish oil as she has severe statin intolerance.  Hemoglobin A1c is 8.4%.  Discharge Diagnoses:  Principal Problem:   Ischemic cerebrovascular accident (CVA) ([place]) Active Problems:   Hypertension   Diabetes mellitus ([place])   Hyperlipidemia   Hypoglycemia   Atypical chest pain  Principal discharge diagnosis: Acute left upper extremity weakness secondary to right frontal ischemic CVA.  Discharge Instructions  Discharge Instructions    Diet - low sodium heart healthy   Complete by: As directed    Increase activity slowly   Complete by: As directed      Allergies as of 01/29/2020      Reactions   Alendronate    Other reaction(s): Other (See Comments) Leg edema   Clonidine Derivatives Other (See Comments)   dizziness   Crestor [rosuvastatin]    Muscle cramps   Lisinopril Other (See Comments)   Tongue swelling   Losartan Other (See Comments)   TONGUE SWELLING   Codeine Nausea And Vomiting   Hydrocodone Nausea And Vomiting      Medication List    STOP taking these medications   aspirin 81 MG tablet Replaced by: aspirin 81 MG EC tablet     TAKE these  medications   amLODipine 5 MG tablet Commonly known as: NORVASC TAKE 1 TABLET ONCE DAILY.   aspirin 81 MG EC tablet Take 1 tablet (81 mg total) by mouth at bedtime. Replaces: aspirin 81 MG tablet   betamethasone dipropionate 0.05 % cream Apply 1 application topically 2 (two) times daily.   chlorthalidone 25 MG tablet Commonly known as: HYGROTON TAKE 1/2 TABLET BY MOUTH TWICE WEEKLY ON MONDAYS AND FRIDAYS.   clopidogrel 75 MG tablet Commonly known as: Plavix Take 1 tablet (75 mg total) by mouth daily.   ezetimibe 10 MG tablet Commonly known as: ZETIA Take 1 tablet (10 mg total) by mouth daily.   hydrALAZINE 25 MG tablet Commonly known as: APRESOLINE Take 25 mg by mouth. Take 1/2 tab every morning & 1 tab every evening   insulin glargine 100 UNIT/ML injection Commonly known as: LANTUS Inject 12-47 Units into the skin See admin instructions. Inject 12 units SQ every morning and inject 47 units SQ every evening around 1600   insulin lispro 100 UNIT/ML injection Commonly known as: HUMALOG Inject 16 Units into the skin 3 (three) times daily with meals.   labetalol 200 MG tablet Commonly known as: NORMODYNE TAKE (1) TABLET TWICE DAILY.   Lumigan 0.01 % Soln Generic drug: bimatoprost Place 1 drop into both eyes at bedtime.   magnesium oxide 400 MG tablet Commonly known as: MAG-OX Take 400 mg by mouth  daily.   metFORMIN 1000 MG tablet Commonly known as: GLUCOPHAGE Take 1,000 mg by mouth as needed. As needed   nitroGLYCERIN 0.4 MG SL tablet Commonly known as: NITROSTAT Place 1 tablet (0.4 mg total) under the tongue every 5 (five) minutes as needed for chest pain.   oxyCODONE-acetaminophen 10-325 MG tablet Commonly known as: PERCOCET Take 1 tablet by mouth 2 (two) times daily.   Symbicort 160-4.5 MCG/ACT inhaler Generic drug: budesonide-formoterol Inhale 2 puffs into the lungs daily.   Vascepa 1 g capsule Generic drug: icosapent Ethyl Take 1 capsule (1 g total)  by mouth 2 (two) times daily.      Follow-up Information    Phillips Odor, MD Follow up in 4 week(s).   Specialty: Neurology Contact information: [address] [place] [address] [phone]          Allergies  Allergen Reactions  . Alendronate     Other reaction(s): Other (See Comments) Leg edema  . Clonidine Derivatives Other (See Comments)    dizziness  . Crestor [Rosuvastatin]     Muscle cramps  . Lisinopril Other (See Comments)    Tongue swelling  . Losartan Other (See Comments)    TONGUE SWELLING  . Codeine Nausea And Vomiting  . Hydrocodone Nausea And Vomiting    Consultations:  Neurology   Procedures/Studies: CT Angio Head W or Wo Contrast  Result Date: 01/28/2020 CLINICAL DATA:  Neuro deficit, acute, stroke suspected. Additional history provided: Chest pain which began at 6 a.m., patient reports pain was left-sided and radiating into left arm. EXAM: CT ANGIOGRAPHY HEAD AND NECK TECHNIQUE: Multidetector CT imaging of the head and neck was performed using the standard protocol during bolus administration of intravenous contrast. Multiplanar CT image reconstructions and MIPs were obtained to evaluate the vascular anatomy. Carotid stenosis measurements (when applicable) are obtained utilizing NASCET criteria, using the distal internal carotid diameter as the denominator. CONTRAST:  43mL OMNIPAQUE IOHEXOL 350 MG/ML SOLN COMPARISON:  Noncontrast head CT performed earlier the same day 01/28/2020 FINDINGS: CT HEAD FINDINGS Brain: Stable mild chronic small vessel ischemic changes within the cerebral white matter and basal ganglia. This includes a small chronic lacunar infarct within the left basal ganglia. Mild generalized parenchymal atrophy. There is no acute intracranial hemorrhage. No demarcated cortical infarct. No extra-axial fluid collection. No evidence of intracranial mass. No midline shift. Vascular: Reported below. Skull: Normal. Negative for fracture or  focal lesion. Sinuses: Moderate mucosal thickening within the left maxillary sinus. No significant mastoid effusion. Orbits: Visualized orbits show no acute finding. Review of the MIP images confirms the above findings CTA NECK FINDINGS Aortic arch: Standard aortic branching. Atherosclerotic plaque within the visualized aortic arch and proximal major branch vessels of the neck. No hemodynamically significant innominate or proximal subclavian artery stenosis. Right carotid system: The CCA is patent to the bifurcation without significant stenosis. Calcified plaque within the carotid bifurcation. Resultant 50-60% stenosis at the origin of the ICA compared to the vessel more distally within the neck. There is additional moderate mixed plaque within the proximal right ICA with out significant stenosis at this site (50% or greater). Left carotid system: The CCA and ICA are patent within the neck without significant stenosis (50% or greater) mild atherosclerotic plaque at the carotid bifurcation and within the proximal ICA. Vertebral arteries: The vertebral arteries are patent within the neck bilaterally without significant stenosis. Mild scattered calcified plaque within both vessels. The left vertebral artery is slightly dominant. Skeleton: No acute bony abnormality or  aggressive osseous lesion. Sequela of prior C6-C7 ACDF. Other neck: No neck mass or lymphadenopathy. Upper chest: No consolidation within the imaged lung apices. Mild left apical pleuroparenchymal scarring. Review of the MIP images confirms the above findings CTA HEAD FINDINGS Anterior circulation: The intracranial internal carotid arteries are patent. Calcified plaque within the cavernous and paraclinoid segments with sites of mild to moderate stenosis bilaterally. The M1 middle cerebral arteries are patent without significant stenosis. No M2 proximal branch occlusion or high-grade proximal stenosis is identified. The anterior cerebral arteries are patent  without significant proximal stenosis. No intracranial aneurysm is identified. Posterior circulation: The intracranial vertebral arteries are patent without significant stenosis, as is the basilar artery. Mild calcified plaque within the V4 segments bilaterally. The bilateral posterior cerebral arteries are patent without significant proximal stenosis. Posterior communicating arteries are hypoplastic or absent bilaterally. Venous sinuses: Within limitations of contrast timing, no convincing thrombus. Anatomic variants: As described Review of the MIP images confirms the above findings IMPRESSION: CT head: 1. No evidence of acute intracranial abnormality. 2. Stable mild generalized parenchymal atrophy and chronic small vessel ischemic disease with small chronic left basal ganglia lacunar infarct. 3. Moderate left maxillary sinus mucosal thickening. CTA neck: 1. The right common and internal carotid arteries are patent within the neck. Calcified plaque results in 50-60% stenosis at the origin of the right ICA. Additional moderate mixed plaque more distally within the proximal right ICA without significant stenosis at this site. 2. The left common carotid, left internal carotid and bilateral vertebral arteries are patent within the neck without significant stenosis. CTA head: 1. No intracranial large vessel occlusion or proximal high-grade arterial stenosis. 2. Calcified plaque within the intracranial ICAs with sites of mild-to-moderate stenosis bilaterally. Electronically Signed   By: Kellie Simmering DO   On: 01/28/2020 16:27   DG Chest 2 View  Result Date: 01/28/2020 CLINICAL DATA:  Chest pain.  Hypertension. EXAM: CHEST - 2 VIEW COMPARISON:  Chest CT September 24, 2019 FINDINGS: Lungs are clear. Heart size and pulmonary vascularity are normal. No adenopathy. There is aortic atherosclerosis. There old healed rib fractures on the left. Postoperative change noted in the lower cervical spine region. No pneumothorax.  IMPRESSION: No edema or airspace opacity. Cardiac silhouette normal. Evidence of old rib trauma on the left. Aortic Atherosclerosis (ICD10-I70.0). Electronically Signed   By: Lowella Grip III M.D.   On: 01/28/2020 12:02   CT HEAD WO CONTRAST  Result Date: 01/28/2020 CLINICAL DATA:  Ataxia.  Hypertension.  Acute onset. EXAM: CT HEAD WITHOUT CONTRAST TECHNIQUE: Contiguous axial images were obtained from the base of the skull through the vertex without intravenous contrast. COMPARISON:  06/17/2016 FINDINGS: Brain: Age related atrophy. Chronic small-vessel ischemic changes of the cerebral hemispheric white matter and basal ganglia regions. No sign of acute infarction, mass lesion, hemorrhage, hydrocephalus or extra-axial collection. Vascular: There is atherosclerotic calcification of the major vessels at the base of the brain. Skull: Negative Sinuses/Orbits: Left maxillary sinusitis. Other sinuses are clear. Orbits negative. Other: None IMPRESSION: No acute intracranial finding. Atrophy and chronic small-vessel ischemic changes. Left maxillary sinusitis. Electronically Signed   By: Nelson Chimes M.D.   On: 01/28/2020 12:59   CT Angio Neck W and/or Wo Contrast  Result Date: 01/28/2020 CLINICAL DATA:  Neuro deficit, acute, stroke suspected. Additional history provided: Chest pain which began at 6 a.m., patient reports pain was left-sided and radiating into left arm. EXAM: CT ANGIOGRAPHY HEAD AND NECK TECHNIQUE: Multidetector CT imaging of the head and  neck was performed using the standard protocol during bolus administration of intravenous contrast. Multiplanar CT image reconstructions and MIPs were obtained to evaluate the vascular anatomy. Carotid stenosis measurements (when applicable) are obtained utilizing NASCET criteria, using the distal internal carotid diameter as the denominator. CONTRAST:  59mL OMNIPAQUE IOHEXOL 350 MG/ML SOLN COMPARISON:  Noncontrast head CT performed earlier the same day  01/28/2020 FINDINGS: CT HEAD FINDINGS Brain: Stable mild chronic small vessel ischemic changes within the cerebral white matter and basal ganglia. This includes a small chronic lacunar infarct within the left basal ganglia. Mild generalized parenchymal atrophy. There is no acute intracranial hemorrhage. No demarcated cortical infarct. No extra-axial fluid collection. No evidence of intracranial mass. No midline shift. Vascular: Reported below. Skull: Normal. Negative for fracture or focal lesion. Sinuses: Moderate mucosal thickening within the left maxillary sinus. No significant mastoid effusion. Orbits: Visualized orbits show no acute finding. Review of the MIP images confirms the above findings CTA NECK FINDINGS Aortic arch: Standard aortic branching. Atherosclerotic plaque within the visualized aortic arch and proximal major branch vessels of the neck. No hemodynamically significant innominate or proximal subclavian artery stenosis. Right carotid system: The CCA is patent to the bifurcation without significant stenosis. Calcified plaque within the carotid bifurcation. Resultant 50-60% stenosis at the origin of the ICA compared to the vessel more distally within the neck. There is additional moderate mixed plaque within the proximal right ICA with out significant stenosis at this site (50% or greater). Left carotid system: The CCA and ICA are patent within the neck without significant stenosis (50% or greater) mild atherosclerotic plaque at the carotid bifurcation and within the proximal ICA. Vertebral arteries: The vertebral arteries are patent within the neck bilaterally without significant stenosis. Mild scattered calcified plaque within both vessels. The left vertebral artery is slightly dominant. Skeleton: No acute bony abnormality or aggressive osseous lesion. Sequela of prior C6-C7 ACDF. Other neck: No neck mass or lymphadenopathy. Upper chest: No consolidation within the imaged lung apices. Mild left  apical pleuroparenchymal scarring. Review of the MIP images confirms the above findings CTA HEAD FINDINGS Anterior circulation: The intracranial internal carotid arteries are patent. Calcified plaque within the cavernous and paraclinoid segments with sites of mild to moderate stenosis bilaterally. The M1 middle cerebral arteries are patent without significant stenosis. No M2 proximal branch occlusion or high-grade proximal stenosis is identified. The anterior cerebral arteries are patent without significant proximal stenosis. No intracranial aneurysm is identified. Posterior circulation: The intracranial vertebral arteries are patent without significant stenosis, as is the basilar artery. Mild calcified plaque within the V4 segments bilaterally. The bilateral posterior cerebral arteries are patent without significant proximal stenosis. Posterior communicating arteries are hypoplastic or absent bilaterally. Venous sinuses: Within limitations of contrast timing, no convincing thrombus. Anatomic variants: As described Review of the MIP images confirms the above findings IMPRESSION: CT head: 1. No evidence of acute intracranial abnormality. 2. Stable mild generalized parenchymal atrophy and chronic small vessel ischemic disease with small chronic left basal ganglia lacunar infarct. 3. Moderate left maxillary sinus mucosal thickening. CTA neck: 1. The right common and internal carotid arteries are patent within the neck. Calcified plaque results in 50-60% stenosis at the origin of the right ICA. Additional moderate mixed plaque more distally within the proximal right ICA without significant stenosis at this site. 2. The left common carotid, left internal carotid and bilateral vertebral arteries are patent within the neck without significant stenosis. CTA head: 1. No intracranial large vessel occlusion or proximal high-grade arterial  stenosis. 2. Calcified plaque within the intracranial ICAs with sites of mild-to-moderate  stenosis bilaterally. Electronically Signed   By: Kellie Simmering DO   On: 01/28/2020 16:27   MR ANGIO HEAD WO CONTRAST  Result Date: 01/29/2020 CLINICAL DATA:  Weakness and dizziness EXAM: MRI HEAD WITHOUT CONTRAST MRA HEAD WITHOUT CONTRAST TECHNIQUE: Multiplanar, multiecho pulse sequences of the brain and surrounding structures were obtained without intravenous contrast. Angiographic images of the head were obtained using MRA technique without contrast. COMPARISON:  None. FINDINGS: MRI HEAD FINDINGS Brain: There is a 10 mm focus of restricted diffusion within the right centrum semiovale. There is no evidence of intracranial hemorrhage. Prominence of the ventricles and sulci reflects generalized parenchymal volume loss. Patchy and confluent areas of T2 hyperintensity in the supratentorial white matter are nonspecific but probably reflect mild to moderate chronic microvascular ischemic changes. 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 Vascular: Major vessel flow voids at the skull base are preserved. Skull and upper cervical spine: Marrow signal is within normal limits. Sinuses/Orbits: Paranasal sinus mucosal thickening, greatest within the left maxillary sinus. Bilateral lens replacements. Other: Mastoid air cells are clear.  Sella is unremarkable. MRA HEAD FINDINGS Intracranial internal carotid arteries are patent with atherosclerotic irregularity. Middle and anterior cerebral arteries are patent. Intracranial vertebral arteries, basilar artery, posterior cerebral arteries are patent. There is no significant stenosis or aneurysm. IMPRESSION: Small acute infarct of the right frontal central white matter. Chronic microvascular ischemic changes. No proximal intracranial vessel occlusion or significant stenosis. Electronically Signed   By: Macy Mis M.D.   On: 01/29/2020 09:54   MR Brain Wo Contrast (neuro protocol)  Result Date: 01/29/2020 CLINICAL DATA:  Weakness and dizziness  EXAM: MRI HEAD WITHOUT CONTRAST MRA HEAD WITHOUT CONTRAST TECHNIQUE: Multiplanar, multiecho pulse sequences of the brain and surrounding structures were obtained without intravenous contrast. Angiographic images of the head were obtained using MRA technique without contrast. COMPARISON:  None. FINDINGS: MRI HEAD FINDINGS Brain: There is a 10 mm focus of restricted diffusion within the right centrum semiovale. There is no evidence of intracranial hemorrhage. Prominence of the ventricles and sulci reflects generalized parenchymal volume loss. Patchy and confluent areas of T2 hyperintensity in the supratentorial white matter are nonspecific but probably reflect mild to moderate chronic microvascular ischemic changes. 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 Vascular: Major vessel flow voids at the skull base are preserved. Skull and upper cervical spine: Marrow signal is within normal limits. Sinuses/Orbits: Paranasal sinus mucosal thickening, greatest within the left maxillary sinus. Bilateral lens replacements. Other: Mastoid air cells are clear.  Sella is unremarkable. MRA HEAD FINDINGS Intracranial internal carotid arteries are patent with atherosclerotic irregularity. Middle and anterior cerebral arteries are patent. Intracranial vertebral arteries, basilar artery, posterior cerebral arteries are patent. There is no significant stenosis or aneurysm. IMPRESSION: Small acute infarct of the right frontal central white matter. Chronic microvascular ischemic changes. No proximal intracranial vessel occlusion or significant stenosis. Electronically Signed   By: Macy Mis M.D.   On: 01/29/2020 09:54   ECHOCARDIOGRAM COMPLETE BUBBLE STUDY  Result Date: 01/29/2020    ECHOCARDIOGRAM REPORT   Patient Name:   [name] [name] Date of Exam: 01/29/2020 Medical Rec #:  [id number]       Height:       65.0 in Accession #:    [id number]      Weight:       171.7 lb Date of Birth:  05/17/1941       BSA:           1.854 m Patient Age:    26 years        BP:           180/61 mmHg Patient Gender: F               HR:           58 bpm. Exam Location:  [facility] Procedure: 2D Echo Indications:    Stroke 434.91 / I63.9  History:        Patient has prior history of Echocardiogram examinations, most                 recent 06/18/2016. CAD, Signs/Symptoms:Chest Pain; Risk                 Factors:Diabetes, Hypertension, Dyslipidemia and Former Smoker.  Sonographer:    Leavy Cella RDCS (AE) Referring Phys: [id number] [place]  1. Left ventricular ejection fraction, by estimation, is 65 to 70%. The left ventricle has normal function. The left ventricle has no regional wall motion abnormalities. There is mild left ventricular hypertrophy. Left ventricular diastolic parameters are indeterminate.  2. Right ventricular systolic function is normal. The right ventricular size is normal. Tricuspid regurgitation signal is inadequate for assessing PA pressure.  3. The mitral valve is grossly normal. Trivial mitral valve regurgitation.  4. The aortic valve is tricuspid. Aortic valve regurgitation is not visualized. Mild aortic valve sclerosis is present, with no evidence of aortic valve stenosis.  5. The inferior vena cava is normal in size with greater than 50% respiratory variability, suggesting right atrial pressure of 3 mmHg.  6. Agitated saline contrast bubble study was negative, with no evidence of any interatrial shunt. FINDINGS  Left Ventricle: Left ventricular ejection fraction, by estimation, is 65 to 70%. The left ventricle has normal function. The left ventricle has no regional wall motion abnormalities. The left ventricular internal cavity size was normal in size. There is  mild left ventricular hypertrophy. Left ventricular diastolic parameters are indeterminate. Right Ventricle: The right ventricular size is normal. No increase in right ventricular wall thickness. Right ventricular systolic function is  normal. Tricuspid regurgitation signal is inadequate for assessing PA pressure. Left Atrium: Left atrial size was normal in size. Right Atrium: Right atrial size was normal in size. Pericardium: Trivial pericardial effusion is present. The pericardial effusion is posterior to the left ventricle. Presence of pericardial fat pad. Mitral Valve: The mitral valve is grossly normal. Trivial mitral valve regurgitation. Tricuspid Valve: The tricuspid valve is grossly normal. Tricuspid valve regurgitation is trivial. Aortic Valve: The aortic valve is tricuspid. Aortic valve regurgitation is not visualized. Mild aortic valve sclerosis is present, with no evidence of aortic valve stenosis. Mild aortic valve annular calcification. Pulmonic Valve: The pulmonic valve was grossly normal. Pulmonic valve regurgitation is trivial. Aorta: The aortic root is normal in size and structure. Venous: The inferior vena cava is normal in size with greater than 50% respiratory variability, suggesting right atrial pressure of 3 mmHg. IAS/Shunts: No atrial level shunt detected by color flow Doppler. Agitated saline contrast was given intravenously to evaluate for intracardiac shunting. Agitated saline contrast bubble study was negative, with no evidence of any interatrial shunt.  LEFT VENTRICLE PLAX 2D LVIDd:         3.39 cm  Diastology LVIDs:         2.04 cm  LV e' lateral:  5.92 cm/s LV PW:         1.20 cm  LV E/e' lateral: 18.1 LV IVS:        1.13 cm  LV e' medial:    5.59 cm/s LVOT diam:     1.70 cm  LV E/e' medial:  19.1 LVOT Area:     2.27 cm  RIGHT VENTRICLE RV S prime:     14.30 cm/s TAPSE (M-mode): 2.7 cm LEFT ATRIUM             Index LA diam:        3.20 cm 1.73 cm/m LA Vol (A2C):   60.9 ml 32.85 ml/m LA Vol (A4C):   38.7 ml 20.87 ml/m LA Biplane Vol: 49.4 ml 26.64 ml/m   AORTA Ao Root diam: 2.40 cm MITRAL VALVE MV Area (PHT): 2.29 cm     SHUNTS MV Decel Time: 331 msec     Systemic Diam: 1.70 cm MV E velocity: 107.00 cm/s MV A  velocity: 86.50 cm/s MV E/A ratio:  1.24 Rozann Lesches MD Electronically signed by Rozann Lesches MD Signature Date/Time: 01/29/2020/4:51:14 PM    Final      Discharge Exam: Vitals:   01/29/20 1524 01/29/20 1652  BP: (!) 180/61 (!) 185/58  Pulse: (!) 58 (!) 55  Resp: 20 20  Temp: 99 F (37.2 C) 98 F (36.7 C)  SpO2: 97% 94%   Vitals:   01/29/20 0951 01/29/20 1300 01/29/20 1524 01/29/20 1652  BP: (!) 201/73 (!) 157/61 (!) 180/61 (!) 185/58  Pulse: 64 (!) 58 (!) 58 (!) 55  Resp: 18 16 20 20   Temp: 98.1 F (36.7 C) 98.6 F (37 C) 99 F (37.2 C) 98 F (36.7 C)  TempSrc: Oral Oral Oral Oral  SpO2: 98% 95% 97% 94%  Weight:      Height:        General: Pt is alert, awake, not in acute distress Cardiovascular: RRR, S1/S2 +, no rubs, no gallops Respiratory: CTA bilaterally, no wheezing, no rhonchi Abdominal: Soft, NT, ND, bowel sounds + Extremities: no edema, no cyanosis    The results of significant diagnostics from this hospitalization (including imaging, microbiology, ancillary and laboratory) are listed below for reference.     Microbiology: Recent Results (from the past 240 hour(s))  Respiratory Panel by RT PCR (Flu A&B, Covid) - Nasopharyngeal Swab     Status: None   Collection Time: 01/28/20  2:48 PM   Specimen: Nasopharyngeal Swab  Result Value Ref Range Status   SARS Coronavirus 2 by RT PCR NEGATIVE NEGATIVE Final    Comment: (NOTE) SARS-CoV-2 target nucleic acids are NOT DETECTED. The SARS-CoV-2 RNA is generally detectable in upper respiratoy specimens during the acute phase of infection. The lowest concentration of SARS-CoV-2 viral copies this assay can detect is 131 copies/mL. A negative result does not preclude SARS-Cov-2 infection and should not be used as the sole basis for treatment or other patient management decisions. A negative result may occur with  improper specimen collection/handling, submission of specimen other than nasopharyngeal swab,  presence of viral mutation(s) within the areas targeted by this assay, and inadequate number of viral copies (<131 copies/mL). A negative result must be combined with clinical observations, patient history, and epidemiological information. The expected result is Negative. Fact Sheet for Patients:  PinkCheek.be Fact Sheet for Healthcare Providers:  GravelBags.it This test is not yet ap proved or cleared by the [place] FDA and  has been authorized for detection and/or diagnosis of  SARS-CoV-2 by FDA under an Emergency Use Authorization (EUA). This EUA will remain  in effect (meaning this test can be used) for the duration of the COVID-19 declaration under Section 564(b)(1) of the Act, 21 U.S.C. section 360bbb-3(b)(1), unless the authorization is terminated or revoked sooner.    Influenza A by PCR NEGATIVE NEGATIVE Final   Influenza B by PCR NEGATIVE NEGATIVE Final    Comment: (NOTE) The Xpert Xpress SARS-CoV-2/FLU/RSV assay is intended as an aid in  the diagnosis of influenza from Nasopharyngeal swab specimens and  should not be used as a sole basis for treatment. Nasal washings and  aspirates are unacceptable for Xpert Xpress SARS-CoV-2/FLU/RSV  testing. Fact Sheet for Patients: PinkCheek.be Fact Sheet for Healthcare Providers: GravelBags.it This test is not yet approved or cleared by the [place] FDA and  has been authorized for detection and/or diagnosis of SARS-CoV-2 by  FDA under an Emergency Use Authorization (EUA). This EUA will remain  in effect (meaning this test can be used) for the duration of the  Covid-19 declaration under Section 564(b)(1) of the Act, 21  U.S.C. section 360bbb-3(b)(1), unless the authorization is  terminated or revoked. Performed at [facility], [address], [place], [place] [address]      Labs: BNP (last 3 results) No  results for input(s): BNP in the last 8760 hours. Basic Metabolic Panel: Recent Labs  Lab 01/28/20 1225 01/28/20 1621 01/29/20 0603  NA 138  --  134*  K 4.0  --  3.6  CL 100  --  99  CO2 28  --  25  GLUCOSE 93  --  306*  BUN 11  --  14  CREATININE 0.63  --  0.71  CALCIUM 9.5  --  8.9  MG  --  1.9 1.9   Liver Function Tests: Recent Labs  Lab 01/28/20 1225  AST 19  ALT 20  ALKPHOS 109  BILITOT 0.8  PROT 6.9  ALBUMIN 3.9   No results for input(s): LIPASE, AMYLASE in the last 168 hours. No results for input(s): AMMONIA in the last 168 hours. CBC: Recent Labs  Lab 01/28/20 1225 01/29/20 0603  WBC 7.0 5.6  NEUTROABS 4.1  --   HGB 13.4 12.7  HCT 40.2 39.2  MCV 89.3 89.7  PLT 277 271   Cardiac Enzymes: No results for input(s): CKTOTAL, CKMB, CKMBINDEX, TROPONINI in the last 168 hours. BNP: Invalid input(s): POCBNP CBG: Recent Labs  Lab 01/28/20 1427 01/28/20 2107 01/29/20 0732 01/29/20 1348 01/29/20 1617  GLUCAP 54* 233* 296* 349* 300*   D-Dimer No results for input(s): DDIMER in the last 72 hours. Hgb A1c Recent Labs    01/28/20 1225  HGBA1C 8.4*   Lipid Profile Recent Labs    01/29/20 0603  CHOL 235*  HDL 37*  LDLCALC 153*  TRIG 225*  CHOLHDL 6.4   Thyroid function studies No results for input(s): TSH, T4TOTAL, T3FREE, THYROIDAB in the last 72 hours.  Invalid input(s): FREET3 Anemia work up No results for input(s): VITAMINB12, FOLATE, FERRITIN, TIBC, IRON, RETICCTPCT in the last 72 hours. Urinalysis    Component Value Date/Time   COLORURINE STRAW (A) 01/28/2020 1149   APPEARANCEUR CLEAR 01/28/2020 1149   LABSPEC 1.005 01/28/2020 1149   PHURINE 8.0 01/28/2020 1149   GLUCOSEU NEGATIVE 01/28/2020 1149   [place] 01/28/2020 [place] 01/28/2020 [place] 01/28/2020 1149   PROTEINUR NEGATIVE 01/28/2020 1149   NITRITE NEGATIVE 01/28/2020 1149   LEUKOCYTESUR  NEGATIVE 01/28/2020 1149   Sepsis  Labs Invalid input(s): PROCALCITONIN,  WBC,  LACTICIDVEN Microbiology Recent Results (from the past 240 hour(s))  Respiratory Panel by RT PCR (Flu A&B, Covid) - Nasopharyngeal Swab     Status: None   Collection Time: 01/28/20  2:48 PM   Specimen: Nasopharyngeal Swab  Result Value Ref Range Status   SARS Coronavirus 2 by RT PCR NEGATIVE NEGATIVE Final    Comment: (NOTE) SARS-CoV-2 target nucleic acids are NOT DETECTED. The SARS-CoV-2 RNA is generally detectable in upper respiratoy specimens during the acute phase of infection. The lowest concentration of SARS-CoV-2 viral copies this assay can detect is 131 copies/mL. A negative result does not preclude SARS-Cov-2 infection and should not be used as the sole basis for treatment or other patient management decisions. A negative result may occur with  improper specimen collection/handling, submission of specimen other than nasopharyngeal swab, presence of viral mutation(s) within the areas targeted by this assay, and inadequate number of viral copies (<131 copies/mL). A negative result must be combined with clinical observations, patient history, and epidemiological information. The expected result is Negative. Fact Sheet for Patients:  PinkCheek.be Fact Sheet for Healthcare Providers:  GravelBags.it This test is not yet ap proved or cleared by the [place] FDA and  has been authorized for detection and/or diagnosis of SARS-CoV-2 by FDA under an Emergency Use Authorization (EUA). This EUA will remain  in effect (meaning this test can be used) for the duration of the COVID-19 declaration under Section 564(b)(1) of the Act, 21 U.S.C. section 360bbb-3(b)(1), unless the authorization is terminated or revoked sooner.    Influenza A by PCR NEGATIVE NEGATIVE Final   Influenza B by PCR NEGATIVE NEGATIVE Final    Comment: (NOTE) The Xpert Xpress SARS-CoV-2/FLU/RSV assay is  intended as an aid in  the diagnosis of influenza from Nasopharyngeal swab specimens and  should not be used as a sole basis for treatment. Nasal washings and  aspirates are unacceptable for Xpert Xpress SARS-CoV-2/FLU/RSV  testing. Fact Sheet for Patients: PinkCheek.be Fact Sheet for Healthcare Providers: GravelBags.it This test is not yet approved or cleared by the [place] FDA and  has been authorized for detection and/or diagnosis of SARS-CoV-2 by  FDA under an Emergency Use Authorization (EUA). This EUA will remain  in effect (meaning this test can be used) for the duration of the  Covid-19 declaration under Section 564(b)(1) of the Act, 21  U.S.C. section 360bbb-3(b)(1), unless the authorization is  terminated or revoked. Performed at [facility], [address], [place], [place] [address]      Time coordinating discharge: 35 minutes  SIGNED:   Rodena Goldmann, DO [facility] 01/29/2020, 6:27 PM  If 7PM-7AM, please contact night-coverage [email or web address]

## 2020-01-29 NOTE — Progress Notes (Signed)
Unable to obtain VS at 0900 d/t patient being off the floor for MRI. Obtained and tele replaced upon patient arrival to floor.

## 2020-01-29 NOTE — Progress Notes (Signed)
*  PRELIMINARY RESULTS* Echocardiogram 2D Echocardiogram with bubble study has been performed.  [name] [name] 01/29/2020, 4:35 PM

## 2020-01-29 NOTE — Care Management Obs Status (Signed)
[facility] NOTIFICATION   Patient Details  Name: [name] [name] MRN: [id number] Date of Birth: 08/30/1941   Medicare Observation Status Notification Given:  Yes    [name] 01/29/2020, 12:58 PM

## 2020-01-29 NOTE — Care Management Obs Status (Signed)
[place] NOTIFICATION   Patient Details  Name: [name] [name] MRN: [id number] Date of Birth: [birth year]   Medicare Observation Status Notification Given:       [name] 01/29/2020, 12:55 PM

## 2020-01-29 NOTE — Evaluation (Signed)
Physical Therapy Evaluation Patient Details Name: [name] [name] MRN: [id number] DOB: 02-Mar-1941 Today's Date: 01/29/2020   History of Present Illness  [name] [name] is a 79 y.o. 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 MRI pending    Clinical Impression  Patient functioning at baseline for functional mobility and gait, demonstrates good return for bed mobility and ambulating in room/hallways without loss of balance.  Plan:  Patient discharged from physical therapy to care of nursing for ambulation daily as tolerated for length of stay.     Follow Up Recommendations No PT follow up;Supervision - Intermittent    Equipment Recommendations  None recommended by PT    Recommendations for Other Services       Precautions / Restrictions Precautions Precautions: None Restrictions Weight Bearing Restrictions: No      Mobility  Bed Mobility Overal bed mobility: Modified Independent                Transfers Overall transfer level: Modified independent Equipment used: None                Ambulation/Gait Ambulation/Gait assistance: Modified independent (Device/Increase time) Gait Distance (Feet): 120 Feet Assistive device: None Gait Pattern/deviations: WFL(Within Functional Limits) Gait velocity: slightly decreased   General Gait Details: grossly WFL, no loss of balance ambulating in room and hallways, slightly decreased arm swing  Stairs            Wheelchair Mobility    Modified Rankin (Stroke Patients Only)       Balance Overall balance assessment: No apparent balance deficits (not formally assessed)                                           Pertinent Vitals/Pain Pain Assessment: No/denies pain    Home Living Family/patient expects to be discharged to:: Private residence Living Arrangements: Alone Available Help at Discharge: Family;Available PRN/intermittently Type of Home: Apartment Home Access: Level entry     Home Layout: One level Home Equipment: Walker - 2 wheels;Grab bars - tub/shower;Cane - single point;Shower seat      Prior Function Level of Independence: Independent         Comments: Hydrographic surveyor, drives     Hand Dominance   Dominant Hand: Right    Extremity/Trunk Assessment   Upper Extremity Assessment Upper Extremity Assessment: Defer to OT evaluation    Lower Extremity Assessment Lower Extremity Assessment: Overall WFL for tasks assessed    Cervical / Trunk Assessment Cervical / Trunk Assessment: Normal  Communication   Communication: No difficulties  Cognition Arousal/Alertness: Awake/alert Behavior During Therapy: WFL for tasks assessed/performed Overall Cognitive Status: Within Functional Limits for tasks assessed                                        General Comments      Exercises     Assessment/Plan    PT Assessment Patent does not need any further PT services  PT Problem List  PT Treatment Interventions      PT Goals (Current goals can be found in the Care Plan section)  Acute Rehab PT Goals Patient Stated Goal: return home with family to assist PT Goal Formulation: With patient Time For Goal Achievement: 01/29/20 Potential to Achieve Goals: Good    Frequency     Barriers to discharge        Co-evaluation               AM-PAC PT "6 Clicks" Mobility  Outcome Measure Help needed turning from your back to your side while in a flat bed without using bedrails?: None Help needed moving from lying on your back to sitting on the side of a flat bed without using bedrails?:  None Help needed moving to and from a bed to a chair (including a wheelchair)?: None Help needed standing up from a chair using your arms (e.g., wheelchair or bedside chair)?: None Help needed to walk in hospital room?: None Help needed climbing 3-5 steps with a railing? : A Little 6 Click Score: 23    End of Session   Activity Tolerance: Patient tolerated treatment well Patient left: in chair;with call bell/phone within reach Nurse Communication: Mobility status PT Visit Diagnosis: Unsteadiness on feet (R26.81);Other abnormalities of gait and mobility (R26.89);Muscle weakness (generalized) (M62.81)    Time: [id number] PT Time Calculation (min) (ACUTE ONLY): 20 min   Charges:   PT Evaluation $PT Eval Moderate Complexity: 1 Mod PT Treatments $Therapeutic Activity: 8-22 mins        10:51 AM, 01/29/20 Lonell Grandchild, MPT Physical Therapist with [facility] [phone] office [phone] mobile phone

## 2020-01-29 NOTE — Progress Notes (Signed)
Patient discharge home via W/c transported by family vehicle. Alert and oriented..  IV remove by previous shift. Telemetry box return to desk. Discharge instructions in hand.

## 2020-02-01 DIAGNOSIS — M542 Cervicalgia: Secondary | ICD-10-CM | POA: Diagnosis not present

## 2020-02-01 DIAGNOSIS — I69354 Hemiplegia and hemiparesis following cerebral infarction affecting left non-dominant side: Secondary | ICD-10-CM | POA: Diagnosis not present

## 2020-02-01 DIAGNOSIS — I10 Essential (primary) hypertension: Secondary | ICD-10-CM | POA: Diagnosis not present

## 2020-02-01 DIAGNOSIS — I779 Disorder of arteries and arterioles, unspecified: Secondary | ICD-10-CM | POA: Diagnosis not present

## 2020-02-01 DIAGNOSIS — Z09 Encounter for follow-up examination after completed treatment for conditions other than malignant neoplasm: Secondary | ICD-10-CM | POA: Diagnosis not present

## 2020-02-01 DIAGNOSIS — Z79899 Other long term (current) drug therapy: Secondary | ICD-10-CM | POA: Diagnosis not present

## 2020-02-15 DIAGNOSIS — M818 Other osteoporosis without current pathological fracture: Secondary | ICD-10-CM | POA: Diagnosis not present

## 2020-02-15 DIAGNOSIS — M8468XA Pathological fracture in other disease, other site, initial encounter for fracture: Secondary | ICD-10-CM | POA: Diagnosis not present

## 2020-02-20 DIAGNOSIS — M62830 Muscle spasm of back: Secondary | ICD-10-CM | POA: Diagnosis not present

## 2020-02-20 DIAGNOSIS — M9902 Segmental and somatic dysfunction of thoracic region: Secondary | ICD-10-CM | POA: Diagnosis not present

## 2020-02-20 DIAGNOSIS — M9903 Segmental and somatic dysfunction of lumbar region: Secondary | ICD-10-CM | POA: Diagnosis not present

## 2020-02-20 DIAGNOSIS — M9901 Segmental and somatic dysfunction of cervical region: Secondary | ICD-10-CM | POA: Diagnosis not present

## 2020-02-21 DIAGNOSIS — M62830 Muscle spasm of back: Secondary | ICD-10-CM | POA: Diagnosis not present

## 2020-02-21 DIAGNOSIS — M9903 Segmental and somatic dysfunction of lumbar region: Secondary | ICD-10-CM | POA: Diagnosis not present

## 2020-02-21 DIAGNOSIS — M9901 Segmental and somatic dysfunction of cervical region: Secondary | ICD-10-CM | POA: Diagnosis not present

## 2020-02-21 DIAGNOSIS — M9902 Segmental and somatic dysfunction of thoracic region: Secondary | ICD-10-CM | POA: Diagnosis not present

## 2020-02-25 DIAGNOSIS — M9901 Segmental and somatic dysfunction of cervical region: Secondary | ICD-10-CM | POA: Diagnosis not present

## 2020-02-25 DIAGNOSIS — M9902 Segmental and somatic dysfunction of thoracic region: Secondary | ICD-10-CM | POA: Diagnosis not present

## 2020-02-25 DIAGNOSIS — M62830 Muscle spasm of back: Secondary | ICD-10-CM | POA: Diagnosis not present

## 2020-02-25 DIAGNOSIS — M9903 Segmental and somatic dysfunction of lumbar region: Secondary | ICD-10-CM | POA: Diagnosis not present

## 2020-02-26 DIAGNOSIS — M62830 Muscle spasm of back: Secondary | ICD-10-CM | POA: Diagnosis not present

## 2020-02-26 DIAGNOSIS — M9903 Segmental and somatic dysfunction of lumbar region: Secondary | ICD-10-CM | POA: Diagnosis not present

## 2020-02-26 DIAGNOSIS — M9901 Segmental and somatic dysfunction of cervical region: Secondary | ICD-10-CM | POA: Diagnosis not present

## 2020-02-26 DIAGNOSIS — M9902 Segmental and somatic dysfunction of thoracic region: Secondary | ICD-10-CM | POA: Diagnosis not present

## 2020-02-27 DIAGNOSIS — M62830 Muscle spasm of back: Secondary | ICD-10-CM | POA: Diagnosis not present

## 2020-02-27 DIAGNOSIS — M9903 Segmental and somatic dysfunction of lumbar region: Secondary | ICD-10-CM | POA: Diagnosis not present

## 2020-02-27 DIAGNOSIS — M9901 Segmental and somatic dysfunction of cervical region: Secondary | ICD-10-CM | POA: Diagnosis not present

## 2020-02-27 DIAGNOSIS — M9902 Segmental and somatic dysfunction of thoracic region: Secondary | ICD-10-CM | POA: Diagnosis not present

## 2020-02-28 DIAGNOSIS — M9901 Segmental and somatic dysfunction of cervical region: Secondary | ICD-10-CM | POA: Diagnosis not present

## 2020-02-28 DIAGNOSIS — M9902 Segmental and somatic dysfunction of thoracic region: Secondary | ICD-10-CM | POA: Diagnosis not present

## 2020-02-28 DIAGNOSIS — M9903 Segmental and somatic dysfunction of lumbar region: Secondary | ICD-10-CM | POA: Diagnosis not present

## 2020-02-28 DIAGNOSIS — M62830 Muscle spasm of back: Secondary | ICD-10-CM | POA: Diagnosis not present

## 2020-02-29 DIAGNOSIS — Z1211 Encounter for screening for malignant neoplasm of colon: Secondary | ICD-10-CM | POA: Diagnosis not present

## 2020-02-29 DIAGNOSIS — Z79899 Other long term (current) drug therapy: Secondary | ICD-10-CM | POA: Diagnosis not present

## 2020-02-29 DIAGNOSIS — E1165 Type 2 diabetes mellitus with hyperglycemia: Secondary | ICD-10-CM | POA: Diagnosis not present

## 2020-02-29 DIAGNOSIS — R5383 Other fatigue: Secondary | ICD-10-CM | POA: Diagnosis not present

## 2020-02-29 DIAGNOSIS — I10 Essential (primary) hypertension: Secondary | ICD-10-CM | POA: Diagnosis not present

## 2020-02-29 DIAGNOSIS — Z0000 Encounter for general adult medical examination without abnormal findings: Secondary | ICD-10-CM | POA: Diagnosis not present

## 2020-02-29 DIAGNOSIS — Z7189 Other specified counseling: Secondary | ICD-10-CM | POA: Diagnosis not present

## 2020-02-29 DIAGNOSIS — E7800 Pure hypercholesterolemia, unspecified: Secondary | ICD-10-CM | POA: Diagnosis not present

## 2020-02-29 DIAGNOSIS — Z299 Encounter for prophylactic measures, unspecified: Secondary | ICD-10-CM | POA: Diagnosis not present

## 2020-03-02 DIAGNOSIS — I10 Essential (primary) hypertension: Secondary | ICD-10-CM | POA: Diagnosis not present

## 2020-03-04 DIAGNOSIS — M9902 Segmental and somatic dysfunction of thoracic region: Secondary | ICD-10-CM | POA: Diagnosis not present

## 2020-03-04 DIAGNOSIS — M9901 Segmental and somatic dysfunction of cervical region: Secondary | ICD-10-CM | POA: Diagnosis not present

## 2020-03-04 DIAGNOSIS — M9903 Segmental and somatic dysfunction of lumbar region: Secondary | ICD-10-CM | POA: Diagnosis not present

## 2020-03-04 DIAGNOSIS — M62830 Muscle spasm of back: Secondary | ICD-10-CM | POA: Diagnosis not present

## 2020-03-05 DIAGNOSIS — M9902 Segmental and somatic dysfunction of thoracic region: Secondary | ICD-10-CM | POA: Diagnosis not present

## 2020-03-05 DIAGNOSIS — M62830 Muscle spasm of back: Secondary | ICD-10-CM | POA: Diagnosis not present

## 2020-03-05 DIAGNOSIS — M9901 Segmental and somatic dysfunction of cervical region: Secondary | ICD-10-CM | POA: Diagnosis not present

## 2020-03-05 DIAGNOSIS — M9903 Segmental and somatic dysfunction of lumbar region: Secondary | ICD-10-CM | POA: Diagnosis not present

## 2020-03-06 ENCOUNTER — Ambulatory Visit (INDEPENDENT_AMBULATORY_CARE_PROVIDER_SITE_OTHER): Payer: Medicare Other | Admitting: Cardiology

## 2020-03-06 ENCOUNTER — Encounter: Payer: Self-pay | Admitting: Cardiology

## 2020-03-06 ENCOUNTER — Other Ambulatory Visit: Payer: Self-pay

## 2020-03-06 VITALS — BP 188/76 | HR 67 | Ht 65.0 in | Wt 174.0 lb

## 2020-03-06 DIAGNOSIS — E782 Mixed hyperlipidemia: Secondary | ICD-10-CM

## 2020-03-06 DIAGNOSIS — M9901 Segmental and somatic dysfunction of cervical region: Secondary | ICD-10-CM | POA: Diagnosis not present

## 2020-03-06 DIAGNOSIS — M9903 Segmental and somatic dysfunction of lumbar region: Secondary | ICD-10-CM | POA: Diagnosis not present

## 2020-03-06 DIAGNOSIS — I10 Essential (primary) hypertension: Secondary | ICD-10-CM

## 2020-03-06 DIAGNOSIS — I2510 Atherosclerotic heart disease of native coronary artery without angina pectoris: Secondary | ICD-10-CM | POA: Diagnosis not present

## 2020-03-06 DIAGNOSIS — M62830 Muscle spasm of back: Secondary | ICD-10-CM | POA: Diagnosis not present

## 2020-03-06 DIAGNOSIS — M9902 Segmental and somatic dysfunction of thoracic region: Secondary | ICD-10-CM | POA: Diagnosis not present

## 2020-03-06 MED ORDER — HYDRALAZINE HCL 25 MG PO TABS: 25.0000 mg | ORAL_TABLET | Freq: Two times a day (BID) | ORAL | 6 refills | Status: DC

## 2020-03-06 NOTE — Progress Notes (Signed)
Clinical Summary [name] [name] is a 79 y.o.female seen today for follow up of the following medical problems.  1. CAD - prior PTCA to distal LAD in 2001. 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  - echo 06/2012 LVEF 60-65% - DSE 06/2012 with reported ST elevation in inferior leads, negative echo images for ischemia.  -02/2018 coronary CTA suggests severe RCA disease, abnormal FFR in distal LAD and LCX as well. -03/2018 cath as reported below. Received DESx3to RCA. 75% OM1 small vessel managed medically   - no recent symptoms   2. HTN - history of difficult to treat HTN, primarily due to reported medication side effects to several agents.  - history of angioedema on ACE-I and most recently on ARB.  - she reports amlodopine caused leg swelling(she had been on 53m). Clonidine caused dizziness.  - we had started chlrothalidone 579mdaily. She had some muscle cramping, and the dose was decreased to 2543maily, then 79m81md and then 12.5mg 53m.  - dizziness after starting hydralazine, pins and needles feelings in ears with laying down. We have tried continuing this medication. Same side effect reported on nitrates, discontinued.  - she reports alopecia possibly due to aldactone    - home bp's at home 140-150s/60s.  - compliant with meds    3. PAD - prior iliac stent in July 2001 - [place] 9/[place] Right ABI 0.99 Left ABI 0.99, with patent left iliac stent.    4. Hyperlipidemia - intolerant to statins  - she reports side effects on pcsk9 inhibitor, caused signicant palpitations    5. CVA - admission 01/2020 with CVA, MRI showed acute right frontal  - neuro recommended contuing DAPT x 4 weeks, then plavix only. Completed ASA.   Past Medical History:  Diagnosis Date  . Anxiety   . Arthritis    "neck" (03/15/2018)  . CAD (coronary artery disease)    Status post POBA distal LAD, status post  Cardiolite Myoview 2008 negative for ischemia  . COPD (chronic obstructive pulmonary disease) (HCC) [place] Depression   . GERD (gastroesophageal reflux disease)   . Hypercholesterolemia   . Hypertension   . Peripheral vascular disease (HCC) [place] status post iliac stent July 2001   . Type II diabetes mellitus (HCC)      Allergies  Allergen Reactions  . Alendronate     Other reaction(s): Other (See Comments) Leg edema  . Clonidine Derivatives Other (See Comments)    dizziness  . Crestor [Rosuvastatin]     Muscle cramps  . Lisinopril Other (See Comments)    Tongue swelling  . Losartan Other (See Comments)    TONGUE SWELLING  . Codeine Nausea And Vomiting  . Hydrocodone Nausea And Vomiting     Current Outpatient Medications  Medication Sig Dispense Refill  . amLODipine (NORVASC) 5 MG tablet TAKE 1 TABLET ONCE DAILY. 90 tablet 2  . betamethasone dipropionate (DIPROLENE) 0.05 % cream Apply 1 application topically 2 (two) times daily.    . bimatoprost (LUMIGAN) 0.01 % SOLN Place 1 drop into both eyes at bedtime.    . chlorthalidone (HYGROTON) 25 MG tablet TAKE 1/2 TABLET BY MOUTH TWICE WEEKLY ON MONDAYS AND FRIDAYS. 30 tablet 3  . clopidogrel (PLAVIX) 75 MG tablet Take 1 tablet (75 mg total) by mouth daily. 30 tablet 11  . ezetimibe (ZETIA) 10 MG tablet Take 1 tablet (10 mg total) by mouth daily. 90 tablet  1  . hydrALAZINE (APRESOLINE) 25 MG tablet Take 25 mg by mouth. Take 1/2 tab every morning & 1 tab every evening    . Icosapent Ethyl (VASCEPA) 1 g CAPS Take 1 capsule (1 g total) by mouth 2 (two) times daily. 60 capsule 6  . insulin glargine (LANTUS) 100 UNIT/ML injection Inject 12-47 Units into the skin See admin instructions. Inject 12 units SQ every morning and inject 47 units SQ every evening around 1600    . insulin lispro (HUMALOG) 100 UNIT/ML injection Inject 16 Units into the skin 3 (three) times daily with meals.     [name] labetalol (NORMODYNE) 200 MG tablet TAKE (1) TABLET  TWICE DAILY. 60 tablet 3  . magnesium oxide (MAG-OX) 400 MG tablet Take 400 mg by mouth daily.     . metFORMIN (GLUCOPHAGE) 1000 MG tablet Take 1,000 mg by mouth as needed. As needed    . nitroGLYCERIN (NITROSTAT) 0.4 MG SL tablet Place 1 tablet (0.4 mg total) under the tongue every 5 (five) minutes as needed for chest pain. 25 tablet 12  . oxyCODONE-acetaminophen (PERCOCET) 10-325 MG tablet Take 1 tablet by mouth 2 (two) times daily.    . SYMBICORT 160-4.5 MCG/ACT inhaler Inhale 2 puffs into the lungs daily.      No current facility-administered medications for this visit.     Past Surgical History:  Procedure Laterality Date  . ANTERIOR CERVICAL DECOMP/DISCECTOMY FUSION  2001  . BACK SURGERY    . BREAST SURGERY     biopsy left breat-benign  . CATARACT EXTRACTION, BILATERAL Bilateral   . CHOLECYSTECTOMY OPEN    . CORONARY ANGIOPLASTY     POBA distal LAD,  . CORONARY STENT INTERVENTION N/A 03/15/2018   Procedure: CORONARY STENT INTERVENTION;  Surgeon: Jettie Booze, MD;  Location: [facility];  Service: Cardiovascular;  Laterality: N/A;  . DILATION AND CURETTAGE OF UTERUS  1960s  . EYE SURGERY Bilateral    "lasers"  . LEFT HEART CATH AND CORONARY ANGIOGRAPHY N/A 03/15/2018   Procedure: LEFT HEART CATH AND CORONARY ANGIOGRAPHY;  Surgeon: Jettie Booze, MD;  Location: [facility];  Service: Cardiovascular;  Laterality: N/A;  . PERIPHERAL VASCULAR INTERVENTION Right 04/2000    iliac stent   . TUBAL LIGATION       Allergies  Allergen Reactions  . Alendronate     Other reaction(s): Other (See Comments) Leg edema  . Clonidine Derivatives Other (See Comments)    dizziness  . Crestor [Rosuvastatin]     Muscle cramps  . Lisinopril Other (See Comments)    Tongue swelling  . Losartan Other (See Comments)    TONGUE SWELLING  . Codeine Nausea And Vomiting  . Hydrocodone Nausea And Vomiting      Family History  Problem Relation Age of Onset  . Heart  failure Father   . Heart failure Mother   . Diabetes Mother   . COPD Brother      Social History Ms. Montanaro reports that she quit smoking about 24 years ago. Her smoking use included cigarettes. She started smoking about 58 years ago. She has a 20.00 pack-year smoking history. She has never used smokeless tobacco. Ms. Rhody reports no history of alcohol use.   Review of Systems CONSTITUTIONAL: No weight loss, fever, chills, weakness or fatigue.  HEENT: Eyes: No visual loss, blurred vision, double vision or yellow sclerae.No hearing loss, sneezing, congestion, runny nose or sore throat.  SKIN: No rash or itching.  CARDIOVASCULAR:  per hpi RESPIRATORY: No shortness of breath, cough or sputum.  GASTROINTESTINAL: No anorexia, nausea, vomiting or diarrhea. No abdominal pain or blood.  GENITOURINARY: No burning on urination, no polyuria NEUROLOGICAL: No headache, dizziness, syncope, paralysis, ataxia, numbness or tingling in the extremities. No change in bowel or bladder control.  MUSCULOSKELETAL: No muscle, back pain, joint pain or stiffness.  LYMPHATICS: No enlarged nodes. No history of splenectomy.  PSYCHIATRIC: No history of depression or anxiety.  ENDOCRINOLOGIC: No reports of sweating, cold or heat intolerance. No polyuria or polydipsia.  [name]   Physical Examination Today's Vitals   03/06/20 1304  BP: (!) 188/76  Pulse: 67  SpO2: 97%  Weight: 174 lb (78.9 kg)  Height: '5\' 5"'  (1.651 m)   Body mass index is 28.96 kg/m.  Gen: resting comfortably, no acute distress HEENT: no scleral icterus, pupils equal round and reactive, no palptable cervical adenopathy,  CV: RRR, no m/r/g, no jvd Resp: Clear to auscultation bilaterally GI: abdomen is soft, non-tender, non-distended, normal bowel sounds, no hepatosplenomegaly MSK: extremities are warm, no edema.  Skin: warm, no rash Neuro:  no focal deficits Psych: appropriate affect   Diagnostic Studies  11/1999 Cath HEMODYNAMIC DATA:  Aortic pressure was initially extremely high at 215/92. She  was  given intravenous enalaprilat, intravenous labetalol, and intravenous  nitroglycerin, ultimately bringing her systolic blood pressure down to less  than  160. Left ventricular pressure was 180/17, with a corresponding aortic  pressure of  180/80. There was no aortic valve gradient.  LEFT VENTRICULOGRAM: Wall motion was normal. Ejection fraction was estimated  t  65%. No mitral regurgitation.  ABDOMINAL AORTOGRAM: This revealed patent renal arteries and abdominal aorta.  The  left common iliac artery had a 75% stenosis. The right external iliac artery  had a  40% stenosis.  CORONARY ARTERIOGRAPHY (Right dominant):  1. The left main was normal.  2. The left anterior descending artery had a 25% stenosis in the proximal to  mid  vessel. The distal LAD had a 50% stenosis just after the bifurcation of the  large second diagonal Jennae Hakeem. Further down the distal LAD is a focal 80%  stenosis.  3. The left circumflex had a 20% stenosis in the mid vessel. It gave rise to a  small OM-1, a normal sized OM-2 which had a 25% stenosis, and a normal sized  OM-3 which had a 20% stenosis.  4. The right coronary artery had a diffuse 40% stenosis extending from the mid  o  distal vessel. There was a large posterior descending artery which had a  25%,  followed by a 40% stenosis. There were two small posterolateral branches.  IMPRESSIONS:  1. Normal left ventricular systolic function.  2. Peripheral vascular disease, as described.  3. One-vessel coronary artery disease with significant stenosis in the distal  left  anterior descending artery. This appears to correspond with her EKG changes  and  Cardiolite scan. She has moderate, but non-flow limiting disease in the  right coronary and left circumflex.  PLAN: Percutaneous intervention of the distal LAD, see below.  PTCA PROCEDURAL NOTE:  Following the completion of the diagnostic  catheterization,  we opted to proceed with percutaneous intervention. The preexisting 6-French  sheath in the right femoral artery was exchanged over a wire for a 7-French  sheath.  Integrilin and heparin were administered per protocol. We used a 7-French JL-4  guiding catheter and a BMW wire. The reference vessel was approximately 2 mm  in  diameter.  We utilized a 2.0 x 20 mm CrossSail balloon, which was inflated  initially to 12 atm x 2 minutes and then 14 atm x 3 minutes. Final  angiographic  images revealed significant improvement in the lumen with 25% residual stenosis  and  a possible very small non-flow limiting dissection. There was TIMI-3 flow into  the  distal vessel.  COMPLICATIONS: None.  RESULTS: Successful percutaneous transluminal coronary angioplasty of the  distal  left anterior descending artery, reducing an 80% stenosis to 25% residual with  TIMI-3 flow.  PLAN: Integrilin will be continued for 20 hours. The patient needs aggressive  blood pressure control and risk factor reduction.  In regards to her peripheral vascular disease, she will be referred for further  evaluation and possible intervention.  03/2012 Echo LVEF 60-65%, no significant abnormalities   02/19/16 Clinic EKG (performed and reviewed in clinic): sinus bradycardia, no ischemic changes   02/2018 coronary CTA/FFR FINDINGS: FFR < 0.5 distal RCA suggesting severe mid RCA stenosis.  FFR 0.83 mid LAD suggesting non-hemodynamically significant mid LAD stenosis.  FFR 0.66 distal LAD suggesting significant distal LAD stenosis (versus cumulative effect of diffuse disease in LAD).  FFR 0.67 distal LCx suggesting significant stenosis mid to distal LCx (versus cumulative effect of diffuse disease in LCx).  IMPRESSION: 1. Suspect severe mid RCA stenosis.  2. FFR significantly low in distal LAD and distal LCx. This  could suggest a single hemodynamically significant stenosis versus the cumulative effect of a long area of disease.  Suggest cardiac cath.  03/2018 cath  Dist RCA lesion is 75% stenosed.  Mid RCA lesion is 99% stenosed.  Prox RCA to Mid RCA lesion is 60% stenosed.  Drug-eluting stent was successfully placed: STENT SYNERGY DES 3.5X38., 4.0 x 28 mm and 4.0 x 12 mm.  Post intervention, there is a 0% residual stenosis.  Ost 1st Mrg lesion is 50% stenosed.  1st Mrg lesion is 75% stenosed. This was a small vessel.  Mid Cx lesion is 60% stenosed. THis was a fairly small vessel.  Mid LAD lesion is 60% stenosed. THis was not significant by CT FFR.  Prox LAD to Mid LAD lesion is 25% stenosed.  2nd Diag lesion is 75% stenosed.  The left ventricular systolic function is normal.  LV end diastolic pressure is normal.  There is no aortic valve stenosis.    Assessment and Plan  1.,CAD - no symptonms, continue current meds  2. HTN her clinic numbers tend to be even higher due to some white coat HTN, though home numbers are above goal as well -  extensive medication side effects as listed above has led to an atypical regimen - will try increasing hydralazine to 3m bid.   3.Hyperlipidemia - intolerant to statins. Reported side effects on pcsk9 inhibitor -continue zetia   F/u 2 months for further bp management  JArnoldo Lenis M.D.

## 2020-03-06 NOTE — Patient Instructions (Addendum)
Medication Instructions:   Increase Hydralazine to 25mg  twice a day.  Continue all other medications.    Labwork: none  Testing/Procedures: none  Follow-Up: 2 months   Any Other Special Instructions Will Be Listed Below (If Applicable).  If you need a refill on your cardiac medications before your next appointment, please call your pharmacy.

## 2020-03-10 DIAGNOSIS — M9903 Segmental and somatic dysfunction of lumbar region: Secondary | ICD-10-CM | POA: Diagnosis not present

## 2020-03-10 DIAGNOSIS — M9901 Segmental and somatic dysfunction of cervical region: Secondary | ICD-10-CM | POA: Diagnosis not present

## 2020-03-10 DIAGNOSIS — M62830 Muscle spasm of back: Secondary | ICD-10-CM | POA: Diagnosis not present

## 2020-03-10 DIAGNOSIS — M9902 Segmental and somatic dysfunction of thoracic region: Secondary | ICD-10-CM | POA: Diagnosis not present

## 2020-03-12 DIAGNOSIS — M9901 Segmental and somatic dysfunction of cervical region: Secondary | ICD-10-CM | POA: Diagnosis not present

## 2020-03-12 DIAGNOSIS — M9902 Segmental and somatic dysfunction of thoracic region: Secondary | ICD-10-CM | POA: Diagnosis not present

## 2020-03-12 DIAGNOSIS — M9903 Segmental and somatic dysfunction of lumbar region: Secondary | ICD-10-CM | POA: Diagnosis not present

## 2020-03-12 DIAGNOSIS — M62830 Muscle spasm of back: Secondary | ICD-10-CM | POA: Diagnosis not present

## 2020-03-13 DIAGNOSIS — M9902 Segmental and somatic dysfunction of thoracic region: Secondary | ICD-10-CM | POA: Diagnosis not present

## 2020-03-13 DIAGNOSIS — M9901 Segmental and somatic dysfunction of cervical region: Secondary | ICD-10-CM | POA: Diagnosis not present

## 2020-03-13 DIAGNOSIS — M9903 Segmental and somatic dysfunction of lumbar region: Secondary | ICD-10-CM | POA: Diagnosis not present

## 2020-03-13 DIAGNOSIS — M62830 Muscle spasm of back: Secondary | ICD-10-CM | POA: Diagnosis not present

## 2020-03-18 DIAGNOSIS — I6930 Unspecified sequelae of cerebral infarction: Secondary | ICD-10-CM | POA: Diagnosis not present

## 2020-03-18 DIAGNOSIS — I69393 Ataxia following cerebral infarction: Secondary | ICD-10-CM | POA: Diagnosis not present

## 2020-03-18 DIAGNOSIS — M9902 Segmental and somatic dysfunction of thoracic region: Secondary | ICD-10-CM | POA: Diagnosis not present

## 2020-03-18 DIAGNOSIS — E118 Type 2 diabetes mellitus with unspecified complications: Secondary | ICD-10-CM | POA: Diagnosis not present

## 2020-03-18 DIAGNOSIS — M9903 Segmental and somatic dysfunction of lumbar region: Secondary | ICD-10-CM | POA: Diagnosis not present

## 2020-03-18 DIAGNOSIS — M9901 Segmental and somatic dysfunction of cervical region: Secondary | ICD-10-CM | POA: Diagnosis not present

## 2020-03-18 DIAGNOSIS — E785 Hyperlipidemia, unspecified: Secondary | ICD-10-CM | POA: Diagnosis not present

## 2020-03-18 DIAGNOSIS — I2510 Atherosclerotic heart disease of native coronary artery without angina pectoris: Secondary | ICD-10-CM | POA: Diagnosis not present

## 2020-03-18 DIAGNOSIS — M62830 Muscle spasm of back: Secondary | ICD-10-CM | POA: Diagnosis not present

## 2020-03-19 DIAGNOSIS — M9902 Segmental and somatic dysfunction of thoracic region: Secondary | ICD-10-CM | POA: Diagnosis not present

## 2020-03-19 DIAGNOSIS — M9901 Segmental and somatic dysfunction of cervical region: Secondary | ICD-10-CM | POA: Diagnosis not present

## 2020-03-19 DIAGNOSIS — M62830 Muscle spasm of back: Secondary | ICD-10-CM | POA: Diagnosis not present

## 2020-03-19 DIAGNOSIS — M9903 Segmental and somatic dysfunction of lumbar region: Secondary | ICD-10-CM | POA: Diagnosis not present

## 2020-03-20 ENCOUNTER — Telehealth: Payer: Self-pay | Admitting: Cardiology

## 2020-03-20 NOTE — Telephone Encounter (Signed)
Pt called stating that [facility] has not received the change in the Rx for her hydrALAZINE (APRESOLINE) 25 MG tablet [[id number]]

## 2020-03-20 NOTE — Telephone Encounter (Signed)
Spoke with [facility] who confirmed receipt of hydralazine dose increase 03/06/20 and will update and send pt new dose - pt made aware

## 2020-03-24 DIAGNOSIS — M62830 Muscle spasm of back: Secondary | ICD-10-CM | POA: Diagnosis not present

## 2020-03-24 DIAGNOSIS — M9902 Segmental and somatic dysfunction of thoracic region: Secondary | ICD-10-CM | POA: Diagnosis not present

## 2020-03-24 DIAGNOSIS — M9901 Segmental and somatic dysfunction of cervical region: Secondary | ICD-10-CM | POA: Diagnosis not present

## 2020-03-24 DIAGNOSIS — M9903 Segmental and somatic dysfunction of lumbar region: Secondary | ICD-10-CM | POA: Diagnosis not present

## 2020-03-25 DIAGNOSIS — M8488 Other disorders of continuity of bone, other site: Secondary | ICD-10-CM | POA: Diagnosis not present

## 2020-03-25 DIAGNOSIS — S3993XA Unspecified injury of pelvis, initial encounter: Secondary | ICD-10-CM | POA: Diagnosis not present

## 2020-03-25 DIAGNOSIS — S7002XA Contusion of left hip, initial encounter: Secondary | ICD-10-CM | POA: Diagnosis not present

## 2020-03-25 DIAGNOSIS — M8480 Other disorders of continuity of bone, unspecified site: Secondary | ICD-10-CM | POA: Diagnosis not present

## 2020-03-25 DIAGNOSIS — W19XXXA Unspecified fall, initial encounter: Secondary | ICD-10-CM | POA: Diagnosis not present

## 2020-03-25 DIAGNOSIS — S79912A Unspecified injury of left hip, initial encounter: Secondary | ICD-10-CM | POA: Diagnosis not present

## 2020-03-25 DIAGNOSIS — M25552 Pain in left hip: Secondary | ICD-10-CM | POA: Diagnosis not present

## 2020-03-25 DIAGNOSIS — M1612 Unilateral primary osteoarthritis, left hip: Secondary | ICD-10-CM | POA: Diagnosis not present

## 2020-03-31 ENCOUNTER — Other Ambulatory Visit: Payer: Self-pay | Admitting: Cardiology

## 2020-03-31 DIAGNOSIS — I10 Essential (primary) hypertension: Secondary | ICD-10-CM | POA: Diagnosis not present

## 2020-03-31 DIAGNOSIS — G8929 Other chronic pain: Secondary | ICD-10-CM | POA: Diagnosis not present

## 2020-03-31 DIAGNOSIS — Z79899 Other long term (current) drug therapy: Secondary | ICD-10-CM | POA: Diagnosis not present

## 2020-03-31 DIAGNOSIS — R21 Rash and other nonspecific skin eruption: Secondary | ICD-10-CM | POA: Diagnosis not present

## 2020-03-31 DIAGNOSIS — M542 Cervicalgia: Secondary | ICD-10-CM | POA: Diagnosis not present

## 2020-03-31 DIAGNOSIS — I4891 Unspecified atrial fibrillation: Secondary | ICD-10-CM | POA: Diagnosis not present

## 2020-03-31 DIAGNOSIS — Z299 Encounter for prophylactic measures, unspecified: Secondary | ICD-10-CM | POA: Diagnosis not present

## 2020-04-01 DIAGNOSIS — M25552 Pain in left hip: Secondary | ICD-10-CM | POA: Diagnosis not present

## 2020-04-01 DIAGNOSIS — S7002XA Contusion of left hip, initial encounter: Secondary | ICD-10-CM | POA: Diagnosis not present

## 2020-04-02 DIAGNOSIS — I10 Essential (primary) hypertension: Secondary | ICD-10-CM | POA: Diagnosis not present

## 2020-04-09 DIAGNOSIS — I6930 Unspecified sequelae of cerebral infarction: Secondary | ICD-10-CM | POA: Diagnosis not present

## 2020-04-09 DIAGNOSIS — M25552 Pain in left hip: Secondary | ICD-10-CM | POA: Diagnosis not present

## 2020-04-09 DIAGNOSIS — I2510 Atherosclerotic heart disease of native coronary artery without angina pectoris: Secondary | ICD-10-CM | POA: Diagnosis not present

## 2020-04-09 DIAGNOSIS — R269 Unspecified abnormalities of gait and mobility: Secondary | ICD-10-CM | POA: Diagnosis not present

## 2020-04-09 DIAGNOSIS — I69393 Ataxia following cerebral infarction: Secondary | ICD-10-CM | POA: Diagnosis not present

## 2020-04-15 DIAGNOSIS — E113293 Type 2 diabetes mellitus with mild nonproliferative diabetic retinopathy without macular edema, bilateral: Secondary | ICD-10-CM | POA: Diagnosis not present

## 2020-04-15 DIAGNOSIS — H353221 Exudative age-related macular degeneration, left eye, with active choroidal neovascularization: Secondary | ICD-10-CM | POA: Diagnosis not present

## 2020-04-15 DIAGNOSIS — H401134 Primary open-angle glaucoma, bilateral, indeterminate stage: Secondary | ICD-10-CM | POA: Diagnosis not present

## 2020-04-15 DIAGNOSIS — Z794 Long term (current) use of insulin: Secondary | ICD-10-CM | POA: Diagnosis not present

## 2020-04-15 DIAGNOSIS — H353111 Nonexudative age-related macular degeneration, right eye, early dry stage: Secondary | ICD-10-CM | POA: Diagnosis not present

## 2020-04-23 ENCOUNTER — Other Ambulatory Visit: Payer: Self-pay

## 2020-04-23 ENCOUNTER — Encounter: Payer: Self-pay | Admitting: Physical Therapy

## 2020-04-23 ENCOUNTER — Ambulatory Visit: Payer: Medicare Other | Attending: Neurology | Admitting: Physical Therapy

## 2020-04-23 DIAGNOSIS — R2689 Other abnormalities of gait and mobility: Secondary | ICD-10-CM | POA: Diagnosis not present

## 2020-04-23 DIAGNOSIS — R2681 Unsteadiness on feet: Secondary | ICD-10-CM | POA: Insufficient documentation

## 2020-04-23 DIAGNOSIS — M25552 Pain in left hip: Secondary | ICD-10-CM | POA: Diagnosis not present

## 2020-04-23 NOTE — Therapy (Signed)
[facility] [place], [place], [address] Phone: [phone]   Fax:  [phone]  Physical Therapy Evaluation  Patient Details  Name: [name] [name] MRN: [id number] Date of Birth: October 16, 1940 Referring Provider (PT): Monia Sabal. Florene Glen, NP   Encounter Date: 04/23/2020   PT End of Session - 04/23/20 1105    Visit Number 1    Number of Visits 12    Date for PT Re-Evaluation 06/11/20    Authorization Type Progress note every 10th visit    PT Start Time 0945    PT Stop Time 1026    PT Time Calculation (min) 41 min    Activity Tolerance Patient tolerated treatment well    Behavior During Therapy [facility] for tasks assessed/performed           Past Medical History:  Diagnosis Date  . Anxiety   . Arthritis    "neck" (03/15/2018)  . CAD (coronary artery disease)    Status post POBA distal LAD, status post Cardiolite Myoview 2008 negative for ischemia  . COPD (chronic obstructive pulmonary disease) ([place])   . Depression   . GERD (gastroesophageal reflux disease)   . Hypercholesterolemia   . Hypertension   . Peripheral vascular disease ([place])     status post iliac stent July 2001   . Type II diabetes mellitus ([place])     Past Surgical History:  Procedure Laterality Date  . ANTERIOR CERVICAL DECOMP/DISCECTOMY FUSION  2001  . BACK SURGERY    . BREAST SURGERY     biopsy left breat-benign  . CATARACT EXTRACTION, BILATERAL Bilateral   . CHOLECYSTECTOMY OPEN    . CORONARY ANGIOPLASTY     POBA distal LAD,  . CORONARY STENT INTERVENTION N/A 03/15/2018   Procedure: CORONARY STENT INTERVENTION;  Surgeon: Jettie Booze, MD;  Location: [facility];  Service: Cardiovascular;  Laterality: N/A;  . DILATION AND CURETTAGE OF UTERUS  1960s  . EYE SURGERY Bilateral    "lasers"  . LEFT HEART CATH AND CORONARY ANGIOGRAPHY N/A 03/15/2018   Procedure: LEFT HEART CATH AND CORONARY ANGIOGRAPHY;  Surgeon: Jettie Booze, MD;   Location: [facility];  Service: Cardiovascular;  Laterality: N/A;  . PERIPHERAL VASCULAR INTERVENTION Right 04/2000    iliac stent   . TUBAL LIGATION      There were no vitals filed for this visit.    Subjective Assessment - 04/23/20 1059    Subjective COVID-19 screening performed upon arrival. Patient arrives to physical therapy with left hip pain, decreased balance, and difficulty walking secondary to a stroke on 01/28/2020. Patient reports left hip/groin pain with squatting, bending forward, and transfers in/out of a car and chairs. Patient reports weakness has improved since stroke but intermittently feels like she is off balance. Patient avoids steps as much as possible due to fear. Patient's goals are to decrease pain, improve movement and improve balance.    Pertinent History fall risk, HTN, CAD, PVD, COPD, osteopenia, History of CVA 01/2020    Limitations Walking;House hold activities;Standing    How long can you walk comfortably? short distances    Diagnostic tests x-ray: mild degenerative changes of left hip    Patient Stated Goals improve balance and decrease left hip pain    Currently in Pain? No/denies    Pain Location Hip    Pain Orientation Left    Pain Descriptors / Indicators Sharp    Pain Type Acute pain    Pain Onset More than  a month ago    Pain Frequency Occasional    Aggravating Factors  bending forward, squatting    Pain Relieving Factors resting    Effect of Pain on Daily Activities difficulties with transfers              [facility] PT Assessment - 04/23/20 0001      Assessment   Medical Diagnosis Unspecified abnormalities of gait and mobility    Referring Provider (PT) Gae Gallop C. Florene Glen, NP    Onset Date/Surgical Date --   April/May 2021   Next MD Visit 08/14/2020    Prior Therapy no      Precautions   Precautions Fall      Restrictions   Weight Bearing Restrictions No      Balance Screen   Has the patient fallen in the past 6 months Yes     How many times? 1-2    Has the patient had a decrease in activity level because of a fear of falling?  Yes    Is the patient reluctant to leave their home because of a fear of falling?  No      Home Environment   Living Environment Private residence    Living Arrangements Alone    Type of Home Apartment    Home Access Level entry      Prior Function   Level of Independence Independent      ROM / Strength   AROM / PROM / Strength Strength;AROM      AROM   AROM Assessment Site Hip    Right/Left Hip Left    Left Hip Flexion 95    Left Hip ABduction 19      Strength   Strength Assessment Site Hip;Knee    Right/Left Hip Right;Left    Right Hip Flexion 4-/5    Right Hip ABduction 4-/5    Left Hip Flexion 3+/5    Left Hip Extension 3-/5    Left Hip ABduction 3+/5    Right/Left Knee Right;Left      Transfers   Five time sit to stand comments  15.5 seconds with bilateral UE support      Ambulation/Gait   Gait Pattern Step-through pattern;Decreased stride length;Decreased stance time - left;Decreased step length - left;Decreased step length - right;Decreased hip/knee flexion - left;Wide base of support      Standardized Balance Assessment   Standardized Balance Assessment Berg Balance Test      Berg Balance Test   Sit to Stand Able to stand  independently using hands    Standing Unsupported Able to stand safely 2 minutes    Sitting with Back Unsupported but Feet Supported on Floor or Stool Able to sit safely and securely 2 minutes    Stand to Sit Sits safely with minimal use of hands    Transfers Able to transfer safely, definite need of hands    Standing Unsupported with Eyes Closed Able to stand 10 seconds with supervision    Standing Unsupported with Feet Together Able to place feet together independently and stand 1 minute safely    From Standing, Reach Forward with Outstretched Arm Can reach confidently >25 cm (10")    From Standing Position, Pick up Object from Floor  Able to pick up shoe, needs supervision    From Standing Position, Turn to Look Behind Over each Shoulder Turn sideways only but maintains balance    Turn 360 Degrees Able to turn 360 degrees safely but slowly    Standing  Unsupported, Alternately Place Feet on Step/Stool Able to stand independently and complete 8 steps >20 seconds    Standing Unsupported, One Foot in Front Needs help to step but can hold 15 seconds    Standing on One Leg Tries to lift leg/unable to hold 3 seconds but remains standing independently    Total Score 41                      Objective measurements completed on examination: See above findings.               PT Education - 04/23/20 1103    Education Details bracing, supine marching, supine clams, pillow squeeze.    Person(s) Educated Patient    Methods Explanation;Demonstration;Handout    Comprehension Verbalized understanding;Returned demonstration            PT Short Term Goals - 04/23/20 1107      PT SHORT TERM GOAL #1   Title STG=LTG             PT Long Term Goals - 04/23/20 1107      PT LONG TERM GOAL #1   Title Patient will be independent with HEP    Time 6    Period Weeks    Status New      PT LONG TERM GOAL #2   Title Patient will report ability to perform ADLs and home task with left hip/groin pain less than or equal to 2/10    Time 6    Period Weeks    Status New      PT LONG TERM GOAL #3   Title Patient will decrease risk of falls as noted by 46/56 or greater score on the Berg Balance Scale.    Baseline 41/56 baseline BBS    Time 6    Period Weeks    Status New      PT LONG TERM GOAL #4   Title Patient will improve functional LE strength as noted by the ability to perform modified 5x sit to stand with UE support in 12 seconds or less.    Baseline 15.5 seconds with UE support    Time 6    Period Weeks    Status New                  Plan - 04/23/20 1222    Clinical Impression Statement  Patient is a 79 year old female who presents to physical therapy with left hip pain, decreased left hip MMT, decreased left hip ROM, and decreased balance that began after a stroke on 01/28/2020. Patient's modified 5x sit to stand of 15.5 seconds with UE support categorizes her as a fall risk. Patient's BBS score of 41/56 categorizes her as a low fall risk. Patient and PT discussed plan of care and HEP to which patient reported understanding. Patient would benefit from skilled physical therapy to address deficits and address patient's goals.    Personal Factors and Comorbidities Comorbidity 3+;Age;Time since onset of injury/illness/exacerbation    Comorbidities fall risk, HTN, CAD, PVD, COPD, osteopenia, History of CVA 01/2020    Examination-Activity Limitations Locomotion Level;Transfers;Stand;Stairs;Bend;Squat    Stability/Clinical Decision Making Stable/Uncomplicated    Clinical Decision Making Low    Rehab Potential Good    PT Frequency 2x / week    PT Duration 6 weeks    PT Treatment/Interventions ADLs/Self Care Home Management;Cryotherapy;Electrical Stimulation;Moist Heat;Neuromuscular re-education;Manual techniques;Passive range of motion;Therapeutic exercise;Balance training;Therapeutic activities;Functional mobility training;Stair training;Gait training;Patient/family education  PT Next Visit Plan nustep, LE strengthening, L hip stretching, balance activities in various positions    PT Home Exercise Plan see patient education section    Consulted and Agree with Plan of Care Patient           Patient will benefit from skilled therapeutic intervention in order to improve the following deficits and impairments:  Decreased strength, Decreased activity tolerance, Difficulty walking, Pain, Decreased range of motion, Abnormal gait, Postural dysfunction  Visit Diagnosis: Unsteadiness on feet - Plan: PT plan of care cert/re-cert  Pain in left hip - Plan: PT plan of care cert/re-cert  Other  abnormalities of gait and mobility - Plan: PT plan of care cert/re-cert     Problem List Patient Active Problem List   Diagnosis Date Noted  . Hypoglycemia 01/29/2020  . Atypical chest pain 01/29/2020  . Ischemic cerebrovascular accident (CVA) ([place]) 01/28/2020  . Status post coronary artery stent placement   . Angina pectoris ([place]) 03/15/2018  . Chest pain 06/17/2016  . Hypertensive urgency 06/17/2016  . Hyperlipidemia 10/29/2013  . CAD (coronary artery disease) 10/29/2013  . DM type 2 causing vascular disease ([place]) 10/29/2013  . Asthma 10/29/2013  . Osteopenia 10/29/2013  . Insomnia 10/29/2013  . Chronic constipation 10/29/2013  . Mild carotid artery disease ([place]) 06/09/2012  . Skin lesion 06/09/2012  . 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 Obstructive sleep apnea 06/09/2012  . Hypertension   . Peripheral vascular disease ([place])   . Coronary artery disease   . Diabetes mellitus ([place])     Gabriela Eves, PT, DPT 04/23/2020, 12:49 PM  [facility] [address] [place], [place], [address] Phone: [phone]   Fax:  [phone]  Name: [name] [name] MRN: [id number] Date of Birth: [birth year]

## 2020-04-28 ENCOUNTER — Other Ambulatory Visit: Payer: Self-pay

## 2020-04-28 ENCOUNTER — Encounter: Payer: Self-pay | Admitting: Physical Therapy

## 2020-04-28 ENCOUNTER — Ambulatory Visit: Payer: Medicare Other | Admitting: Physical Therapy

## 2020-04-28 DIAGNOSIS — R2689 Other abnormalities of gait and mobility: Secondary | ICD-10-CM

## 2020-04-28 DIAGNOSIS — M25552 Pain in left hip: Secondary | ICD-10-CM | POA: Diagnosis not present

## 2020-04-28 DIAGNOSIS — R2681 Unsteadiness on feet: Secondary | ICD-10-CM

## 2020-04-28 NOTE — Therapy (Signed)
[facility] [place], [place], [address] Phone: [phone]   Fax:  [phone]  Physical Therapy Treatment  Patient Details  Name: [name] [name] MRN: [id number] Date of Birth: 1941-06-05 Referring Provider (PT): Monia Sabal. Florene Glen, NP   Encounter Date: 04/28/2020   PT End of Session - 04/28/20 1015    Visit Number 2    Number of Visits 12    Date for PT Re-Evaluation 06/11/20    Authorization Type Progress note every 10th visit    PT Start Time 0945    PT Stop Time 1029    PT Time Calculation (min) 44 min    Activity Tolerance Patient tolerated treatment well    Behavior During Therapy [facility] for tasks assessed/performed           Past Medical History:  Diagnosis Date  . Anxiety   . Arthritis    "neck" (03/15/2018)  . CAD (coronary artery disease)    Status post POBA distal LAD, status post Cardiolite Myoview 2008 negative for ischemia  . COPD (chronic obstructive pulmonary disease) ([place])   . Depression   . GERD (gastroesophageal reflux disease)   . Hypercholesterolemia   . Hypertension   . Peripheral vascular disease ([place])     status post iliac stent July 2001   . Type II diabetes mellitus ([place])     Past Surgical History:  Procedure Laterality Date  . ANTERIOR CERVICAL DECOMP/DISCECTOMY FUSION  2001  . BACK SURGERY    . BREAST SURGERY     biopsy left breat-benign  . CATARACT EXTRACTION, BILATERAL Bilateral   . CHOLECYSTECTOMY OPEN    . CORONARY ANGIOPLASTY     POBA distal LAD,  . CORONARY STENT INTERVENTION N/A 03/15/2018   Procedure: CORONARY STENT INTERVENTION;  Surgeon: Jettie Booze, MD;  Location: [facility];  Service: Cardiovascular;  Laterality: N/A;  . DILATION AND CURETTAGE OF UTERUS  1960s  . EYE SURGERY Bilateral    "lasers"  . LEFT HEART CATH AND CORONARY ANGIOGRAPHY N/A 03/15/2018   Procedure: LEFT HEART CATH AND CORONARY ANGIOGRAPHY;  Surgeon: Jettie Booze, MD;   Location: [facility];  Service: Cardiovascular;  Laterality: N/A;  . PERIPHERAL VASCULAR INTERVENTION Right 04/2000    iliac stent   . TUBAL LIGATION      There were no vitals filed for this visit.   Subjective Assessment - 04/28/20 0950    Subjective COVID-19 screening performed upon arrival. Reports feeling better. States she has been performing her HEP.    Pertinent History fall risk, HTN, CAD, PVD, COPD, osteopenia, History of CVA 01/2020    Limitations Walking;House hold activities;Standing    How long can you walk comfortably? short distances    Diagnostic tests x-ray: mild degenerative changes of left hip    Patient Stated Goals improve balance and decrease left hip pain    Currently in Pain? No/denies              [facility] PT Assessment - 04/28/20 0001      Assessment   Medical Diagnosis Unspecified abnormalities of gait and mobility    Referring Provider (PT) Gae Gallop C. Florene Glen, NP    Next MD Visit 08/14/2020    Prior Therapy no      Precautions   Precautions [place] [facility] Adult PT Treatment/Exercise - 04/28/20  0001      Exercises   Exercises Knee/Hip      Knee/Hip Exercises: Aerobic   Nustep Level 3 x10 mins, LE and UE      Knee/Hip Exercises: Standing   Hip Abduction AROM;Both;2 sets;10 reps;Knee straight    Rocker Board 3 minutes      Knee/Hip Exercises: Seated   Long Arc Quad Strengthening;Both;20 reps;Weights    Long Arc Quad Weight 3 lbs.    Marching Both;2 sets;10 reps;Weights    Marching Limitations 2    Hamstring Curl Both;20 reps    Hamstring Limitations yellow theraband      Knee/Hip Exercises: Supine   Short Arc Quad Sets Strengthening;Both;3 sets;10 reps    Short Arc Quad Sets Limitations 2#    Bridges AROM;Both;2 sets;10 reps    Straight Leg Raises Both;1 set;10 reps    Other Supine Knee/Hip Exercises Clamshell yellow therband x20    Other Supine Knee/Hip Exercises pillow squeeze 2x10 3" hold                     PT Short Term Goals - 04/23/20 1107      PT SHORT TERM GOAL #1   Title STG=LTG             PT Long Term Goals - 04/23/20 1107      PT LONG TERM GOAL #1   Title Patient will be independent with HEP    Time 6    Period Weeks    Status New      PT LONG TERM GOAL #2   Title Patient will report ability to perform ADLs and home task with left hip/groin pain less than or equal to 2/10    Time 6    Period Weeks    Status New      PT LONG TERM GOAL #3   Title Patient will decrease risk of falls as noted by 46/56 or greater score on the Berg Balance Scale.    Baseline 41/56 baseline BBS    Time 6    Period Weeks    Status New      PT LONG TERM GOAL #4   Title Patient will improve functional LE strength as noted by the ability to perform modified 5x sit to stand with UE support in 12 seconds or less.    Baseline 15.5 seconds with UE support    Time 6    Period Weeks    Status New                 Plan - 04/28/20 1015    Clinical Impression Statement Patient responded well to therapy but did report slight increase of discomfort with marching and LAQ. Discomfort dissipated after the completion of the exercise. Patient required constant cuing for form and pacing of exercises throughout today's session. Patient with notable L quad atrophy in comparison to right.    Personal Factors and Comorbidities Comorbidity 3+;Age;Time since onset of injury/illness/exacerbation    Comorbidities fall risk, HTN, CAD, PVD, COPD, osteopenia, History of CVA 01/2020    Examination-Activity Limitations Locomotion Level;Transfers;Stand;Stairs;Bend;Squat    Stability/Clinical Decision Making Stable/Uncomplicated    Clinical Decision Making Low    Rehab Potential Good    PT Frequency 2x / week    PT Duration 6 weeks    PT Treatment/Interventions ADLs/Self Care Home Management;Cryotherapy;Electrical Stimulation;Moist Heat;Neuromuscular re-education;Manual  techniques;Passive range of motion;Therapeutic exercise;Balance training;Therapeutic activities;Functional mobility training;Stair training;Gait training;Patient/family education    PT Next Visit  Plan nustep, LE strengthening, L hip stretching, balance activities in various positions    PT Home Exercise Plan see patient education section    Consulted and Agree with Plan of Care Patient           Patient will benefit from skilled therapeutic intervention in order to improve the following deficits and impairments:  Decreased strength, Decreased activity tolerance, Difficulty walking, Pain, Decreased range of motion, Abnormal gait, Postural dysfunction  Visit Diagnosis: Unsteadiness on feet  Pain in left hip  Other abnormalities of gait and mobility     Problem List Patient Active Problem List   Diagnosis Date Noted  . Hypoglycemia 01/29/2020  . Atypical chest pain 01/29/2020  . Ischemic cerebrovascular accident (CVA) ([place]) 01/28/2020  . Status post coronary artery stent placement   . Angina pectoris ([place]) 03/15/2018  . Chest pain 06/17/2016  . Hypertensive urgency 06/17/2016  . Hyperlipidemia 10/29/2013  . CAD (coronary artery disease) 10/29/2013  . DM type 2 causing vascular disease ([place]) 10/29/2013  . Asthma 10/29/2013  . Osteopenia 10/29/2013  . Insomnia 10/29/2013  . Chronic constipation 10/29/2013  . Mild carotid artery disease ([place]) 06/09/2012  . Skin lesion 06/09/2012  . 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 Obstructive sleep apnea 06/09/2012  . Hypertension   . Peripheral vascular disease ([place])   . Coronary artery disease   . Diabetes mellitus ([place])     Gabriela Eves, PT, DPT 04/28/2020, 12:57 PM  [facility] [address] [place], [place], [address] Phone: [phone]   Fax:  [phone]  Name: [name] [name] MRN: [id number] Date of Birth: [birth year]

## 2020-04-30 DIAGNOSIS — Z299 Encounter for prophylactic measures, unspecified: Secondary | ICD-10-CM | POA: Diagnosis not present

## 2020-04-30 DIAGNOSIS — Z79899 Other long term (current) drug therapy: Secondary | ICD-10-CM | POA: Diagnosis not present

## 2020-04-30 DIAGNOSIS — G4730 Sleep apnea, unspecified: Secondary | ICD-10-CM | POA: Diagnosis not present

## 2020-04-30 DIAGNOSIS — I10 Essential (primary) hypertension: Secondary | ICD-10-CM | POA: Diagnosis not present

## 2020-04-30 DIAGNOSIS — G8929 Other chronic pain: Secondary | ICD-10-CM | POA: Diagnosis not present

## 2020-04-30 DIAGNOSIS — M542 Cervicalgia: Secondary | ICD-10-CM | POA: Diagnosis not present

## 2020-05-01 ENCOUNTER — Other Ambulatory Visit: Payer: Self-pay

## 2020-05-01 ENCOUNTER — Ambulatory Visit: Payer: Medicare Other | Admitting: Physical Therapy

## 2020-05-01 DIAGNOSIS — R2681 Unsteadiness on feet: Secondary | ICD-10-CM

## 2020-05-01 DIAGNOSIS — R2689 Other abnormalities of gait and mobility: Secondary | ICD-10-CM | POA: Diagnosis not present

## 2020-05-01 DIAGNOSIS — M25552 Pain in left hip: Secondary | ICD-10-CM | POA: Diagnosis not present

## 2020-05-01 NOTE — Therapy (Signed)
[name], [place], [address] Phone: [phone]   Fax:  [phone]  Physical Therapy Treatment  Patient Details  Name: [name] [name] MRN: [id number] Date of Birth: Jul 08, 1941 Referring Provider (PT): Monia Sabal. Florene Glen, NP   Encounter Date: 05/01/2020   PT End of Session - 05/01/20 1010    Visit Number 3    Number of Visits 12    Date for PT Re-Evaluation 06/11/20    Authorization Type Progress note every 10th visit    PT Start Time 0945    PT Stop Time 1027    PT Time Calculation (min) 42 min    Activity Tolerance Patient tolerated treatment well    Behavior During Therapy [facility] for tasks assessed/performed           Past Medical History:  Diagnosis Date  . Anxiety   . Arthritis    "neck" (03/15/2018)  . CAD (coronary artery disease)    Status post POBA distal LAD, status post Cardiolite Myoview 2008 negative for ischemia  . COPD (chronic obstructive pulmonary disease) ([place])   . Depression   . GERD (gastroesophageal reflux disease)   . Hypercholesterolemia   . Hypertension   . Peripheral vascular disease ([place])     status post iliac stent July 2001   . Type II diabetes mellitus ([place])     Past Surgical History:  Procedure Laterality Date  . ANTERIOR CERVICAL DECOMP/DISCECTOMY FUSION  2001  . BACK SURGERY    . BREAST SURGERY     biopsy left breat-benign  . CATARACT EXTRACTION, BILATERAL Bilateral   . CHOLECYSTECTOMY OPEN    . CORONARY ANGIOPLASTY     POBA distal LAD,  . CORONARY STENT INTERVENTION N/A 03/15/2018   Procedure: CORONARY STENT INTERVENTION;  Surgeon: Jettie Booze, MD;  Location: [place] [facility];  Service: Cardiovascular;  Laterality: N/A;  . DILATION AND CURETTAGE OF UTERUS  1960s  . EYE SURGERY Bilateral    "lasers"  . LEFT HEART CATH AND CORONARY ANGIOGRAPHY N/A 03/15/2018   Procedure: LEFT HEART CATH AND CORONARY ANGIOGRAPHY;  Surgeon: Jettie Booze, MD;   Location: [place] [facility];  Service: Cardiovascular;  Laterality: N/A;  . PERIPHERAL VASCULAR INTERVENTION Right 04/2000    iliac stent   . TUBAL LIGATION      There were no vitals filed for this visit.   Subjective Assessment - 05/01/20 0946    Subjective COVID-19 screening performed upon arrival. Patient arrived with c/o ongoing weakness in LE's    Pertinent History fall risk, HTN, CAD, PVD, COPD, osteopenia, History of CVA 01/2020    Limitations Walking;House hold activities;Standing    How long can you walk comfortably? short distances    Diagnostic tests x-ray: mild degenerative changes of left hip    Patient Stated Goals improve balance and decrease left hip pain    Currently in Pain? No/denies                             [facility] Adult PT Treatment/Exercise - 05/01/20 0001      Knee/Hip Exercises: Aerobic   Nustep Level 3 x8 mins, LE and UE      Knee/Hip Exercises: Seated   Marching Both;2 sets;10 reps;Weights    Marching Limitations yellow band    Hamstring Curl Both;20 reps    Hamstring Limitations yellow theraband      Knee/Hip Exercises: Supine   Short Arc  Quad Sets Strengthening;Both;3 sets;10 reps    Short Arc [employer] Limitations 2    Bridges Strengthening;Both;2 sets;10 reps    Straight Leg Raises Both;10 reps;2 sets    Other Supine Knee/Hip Exercises Clamshell yellow therband x20    Other Supine Knee/Hip Exercises ball squeeze 2x10 3" hold               Balance Exercises - 05/01/20 0001      Balance Exercises: Standing   Standing Eyes Opened Narrow base of support (BOS);Solid surface;Foam/compliant surface;Wide (BOA)   x1 min NBOS for solid surface, x47min WBOS on airex   Tandem Stance Eyes open;4 reps;10 secs;Intermittent upper extremity support    Rockerboard Anterior/posterior   x81min   Step Ups 4 inch;UE support 1   2x10   Marching Solid surface;Upper extremity assist 1;20 reps    Heel Raises Both;20 reps    Sit to Stand  Standard surface;Upper extremity support   x10              PT Short Term Goals - 04/23/20 1107      PT SHORT TERM GOAL #1   Title STG=LTG             PT Long Term Goals - 05/01/20 0955      PT LONG TERM GOAL #1   Title Patient will be independent with HEP    Time 6    Period Weeks    Status On-going      PT LONG TERM GOAL #2   Title Patient will report ability to perform ADLs and home task with left hip/groin pain less than or equal to 2/10    Time 6    Period Weeks    Status On-going      PT LONG TERM GOAL #3   Title Patient will decrease risk of falls as noted by 46/56 or greater score on the Berg Balance Scale.    Baseline 41/56 baseline BBS    Time 6    Period Weeks    Status On-going      PT LONG TERM GOAL #4   Title Patient will improve functional LE strength as noted by the ability to perform modified 5x sit to stand with UE support in 12 seconds or less.    Baseline 15.5 seconds with UE support    Time 6    Period Weeks    Status On-going                 Plan - 05/01/20 1014    Clinical Impression Statement Patient tolerated treatment well today. today progressed standing balance activities with SBA for safety. Patient able to continue with LE PRE's to strengthen bil LE. Patient has reported no falls just fatiue in LE. Goals progressing this week.    Personal Factors and Comorbidities Comorbidity 3+;Age;Time since onset of injury/illness/exacerbation    Comorbidities fall risk, HTN, CAD, PVD, COPD, osteopenia, History of CVA 01/2020    Examination-Activity Limitations Locomotion Level;Transfers;Stand;Stairs;Bend;Squat    Stability/Clinical Decision Making Stable/Uncomplicated    Rehab Potential Good    PT Frequency 2x / week    PT Duration 6 weeks    PT Treatment/Interventions ADLs/Self Care Home Management;Cryotherapy;Electrical Stimulation;Moist Heat;Neuromuscular re-education;Manual techniques;Passive range of motion;Therapeutic  exercise;Balance training;Therapeutic activities;Functional mobility training;Stair training;Gait training;Patient/family education    PT Next Visit Plan cont with nustep, LE strengthening, L hip stretching, balance activities in various positions    Consulted and Agree with Plan of Care Patient  Patient will benefit from skilled therapeutic intervention in order to improve the following deficits and impairments:  Decreased strength, Decreased activity tolerance, Difficulty walking, Pain, Decreased range of motion, Abnormal gait, Postural dysfunction  Visit Diagnosis: Unsteadiness on feet  Pain in left hip  Other abnormalities of gait and mobility     Problem List Patient Active Problem List   Diagnosis Date Noted  . Hypoglycemia 01/29/2020  . Atypical chest pain 01/29/2020  . Ischemic cerebrovascular accident (CVA) ([place]) 01/28/2020  . Status post coronary artery stent placement   . Angina pectoris ([place]) 03/15/2018  . Chest pain 06/17/2016  . Hypertensive urgency 06/17/2016  . Hyperlipidemia 10/29/2013  . CAD (coronary artery disease) 10/29/2013  . DM type 2 causing vascular disease ([place]) 10/29/2013  . Asthma 10/29/2013  . Osteopenia 10/29/2013  . Insomnia 10/29/2013  . Chronic constipation 10/29/2013  . Mild carotid artery disease ([place]) 06/09/2012  . Skin lesion 06/09/2012  . 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 Obstructive sleep apnea 06/09/2012  . Hypertension   . Peripheral vascular disease ([place])   . Coronary artery disease   . Diabetes mellitus ([place])     Maikel Neisler, [place], PTA 05/01/2020, 10:27 AM  [facility] [place], [place], [address] Phone: [phone]   Fax:  [phone]  Name: JOYCIE AERTS MRN: [id number] Date of Birth: July 14, 1941

## 2020-05-02 DIAGNOSIS — I10 Essential (primary) hypertension: Secondary | ICD-10-CM | POA: Diagnosis not present

## 2020-05-05 ENCOUNTER — Other Ambulatory Visit: Payer: Self-pay | Admitting: Cardiology

## 2020-05-05 ENCOUNTER — Ambulatory Visit: Payer: Medicare Other | Attending: Neurology | Admitting: Physical Therapy

## 2020-05-05 ENCOUNTER — Encounter: Payer: Self-pay | Admitting: Physical Therapy

## 2020-05-05 ENCOUNTER — Other Ambulatory Visit: Payer: Self-pay

## 2020-05-05 DIAGNOSIS — R2681 Unsteadiness on feet: Secondary | ICD-10-CM | POA: Diagnosis not present

## 2020-05-05 DIAGNOSIS — R2689 Other abnormalities of gait and mobility: Secondary | ICD-10-CM | POA: Diagnosis not present

## 2020-05-05 DIAGNOSIS — M25552 Pain in left hip: Secondary | ICD-10-CM | POA: Diagnosis not present

## 2020-05-05 NOTE — Therapy (Signed)
[facility] [place], [place], [address] Phone: [phone]   Fax:  [phone]  Physical Therapy Treatment  Patient Details  Name: [name] [name] MRN: [id number] Date of Birth: 1941-07-24 Referring Provider (PT): Monia Sabal. Florene Glen, NP   Encounter Date: 05/05/2020   PT End of Session - 05/05/20 1036    Visit Number 4    Number of Visits 12    Date for PT Re-Evaluation 06/11/20    Authorization Type Progress note every 10th visit    PT Start Time 1032    PT Stop Time 1113    PT Time Calculation (min) 41 min    Activity Tolerance Patient tolerated treatment well    Behavior During Therapy [facility] for tasks assessed/performed           Past Medical History:  Diagnosis Date  . Anxiety   . Arthritis    "neck" (03/15/2018)  . CAD (coronary artery disease)    Status post POBA distal LAD, status post Cardiolite Myoview 2008 negative for ischemia  . COPD (chronic obstructive pulmonary disease) ([place])   . Depression   . GERD (gastroesophageal reflux disease)   . Hypercholesterolemia   . Hypertension   . Peripheral vascular disease ([place])     status post iliac stent July 2001   . Type II diabetes mellitus ([place])     Past Surgical History:  Procedure Laterality Date  . ANTERIOR CERVICAL DECOMP/DISCECTOMY FUSION  2001  . BACK SURGERY    . BREAST SURGERY     biopsy left breat-benign  . CATARACT EXTRACTION, BILATERAL Bilateral   . CHOLECYSTECTOMY OPEN    . CORONARY ANGIOPLASTY     POBA distal LAD,  . CORONARY STENT INTERVENTION N/A 03/15/2018   Procedure: CORONARY STENT INTERVENTION;  Surgeon: Jettie Booze, MD;  Location: [place] [facility];  Service: Cardiovascular;  Laterality: N/A;  . DILATION AND CURETTAGE OF UTERUS  1960s  . EYE SURGERY Bilateral    "lasers"  . LEFT HEART CATH AND CORONARY ANGIOGRAPHY N/A 03/15/2018   Procedure: LEFT HEART CATH AND CORONARY ANGIOGRAPHY;  Surgeon: Jettie Booze, MD;   Location: [place] [facility];  Service: Cardiovascular;  Laterality: N/A;  . PERIPHERAL VASCULAR INTERVENTION Right 04/2000    iliac stent   . TUBAL LIGATION      There were no vitals filed for this visit.   Subjective Assessment - 05/05/20 1034    Subjective COVID-19 screening performed upon arrival. Patient reports her legs feel weak some if she walks around but feels like she is improving.    Pertinent History fall risk, HTN, CAD, PVD, COPD, osteopenia, History of CVA 01/2020    Limitations Walking;House hold activities;Standing    How long can you walk comfortably? short distances    Diagnostic tests x-ray: mild degenerative changes of left hip    Patient Stated Goals improve balance and decrease left hip pain    Currently in Pain? No/denies              [facility] PT Assessment - 05/05/20 0001      Assessment   Medical Diagnosis Unspecified abnormalities of gait and mobility    Referring Provider (PT) Gae Gallop C. Florene Glen, NP    Next MD Visit 08/14/2020    Prior Therapy no      Precautions   Precautions Fall  [place] Adult PT Treatment/Exercise - 05/05/20 0001      Knee/Hip Exercises: Aerobic   Nustep L3 x5 min      Knee/Hip Exercises: Standing   Heel Raises Both;20 reps    Heel Raises Limitations B toe raise x20 reps    Hip Flexion AROM;Both;3 sets;10 reps;Knee bent    Hip Abduction AROM;Both;2 sets;10 reps;Knee straight    Hip Extension AROM;Both;2 sets;10 reps;Knee straight    Forward Step Up Both;2 sets;10 reps;Hand Hold: 2;Step Height: 6"    Functional Squat 20 reps      Knee/Hip Exercises: Seated   Long Arc Quad Strengthening;Both;20 reps;Weights    Long Arc Quad Weight 2 lbs.    Clamshell with TheraBand Yellow   x20 reps   Marching Both;2 sets;10 reps;Weights    Marching Limitations yellow band    Hamstring Curl Strengthening;Both;3 sets;10 reps;Limitations    Hamstring Limitations yellow theraband    Sit to Sand 10  reps;without UE support      Knee/Hip Exercises: Supine   Short Arc Quad Sets AROM;Both;3 sets;10 reps    Bridges Strengthening;Both;2 sets;10 reps    Straight Leg Raises Both;10 reps;2 sets                    PT Short Term Goals - 04/23/20 1107      PT SHORT TERM GOAL #1   Title STG=LTG             PT Long Term Goals - 05/01/20 0955      PT LONG TERM GOAL #1   Title Patient will be independent with HEP    Time 6    Period Weeks    Status On-going      PT LONG TERM GOAL #2   Title Patient will report ability to perform ADLs and home task with left hip/groin pain less than or equal to 2/10    Time 6    Period Weeks    Status On-going      PT LONG TERM GOAL #3   Title Patient will decrease risk of falls as noted by 46/56 or greater score on the Berg Balance Scale.    Baseline 41/56 baseline BBS    Time 6    Period Weeks    Status On-going      PT LONG TERM GOAL #4   Title Patient will improve functional LE strength as noted by the ability to perform modified 5x sit to stand with UE support in 12 seconds or less.    Baseline 15.5 seconds with UE support    Time 6    Period Weeks    Status On-going                 Plan - 05/05/20 1128    Clinical Impression Statement Patient reports seeing improvement with LE strength such as getting up from the couch. Patient limited with Nustep initially at L5 which was decreased to L3. Patient only able to complete 5 minutes on Nustep today. Patient guided through various strengthening exercises with only reports of fatigue.    Personal Factors and Comorbidities Comorbidity 3+;Age;Time since onset of injury/illness/exacerbation    Comorbidities fall risk, HTN, CAD, PVD, COPD, osteopenia, History of CVA 01/2020    Examination-Activity Limitations Locomotion Level;Transfers;Stand;Stairs;Bend;Squat    Stability/Clinical Decision Making Stable/Uncomplicated    Rehab Potential Good    PT Frequency 2x / week    PT  Duration 6 weeks    PT Treatment/Interventions ADLs/Self  Care Home Management;Cryotherapy;Electrical Stimulation;Moist Heat;Neuromuscular re-education;Manual techniques;Passive range of motion;Therapeutic exercise;Balance training;Therapeutic activities;Functional mobility training;Stair training;Gait training;Patient/family education    PT Next Visit Plan cont with nustep, LE strengthening, L hip stretching, balance activities in various positions    PT Home Exercise Plan see patient education section    Consulted and Agree with Plan of Care Patient           Patient will benefit from skilled therapeutic intervention in order to improve the following deficits and impairments:  Decreased strength, Decreased activity tolerance, Difficulty walking, Pain, Decreased range of motion, Abnormal gait, Postural dysfunction  Visit Diagnosis: Unsteadiness on feet  Pain in left hip  Other abnormalities of gait and mobility     Problem List Patient Active Problem List   Diagnosis Date Noted  . Hypoglycemia 01/29/2020  . Atypical chest pain 01/29/2020  . Ischemic cerebrovascular accident (CVA) ([place]) 01/28/2020  . Status post coronary artery stent placement   . Angina pectoris ([place]) 03/15/2018  . Chest pain 06/17/2016  . Hypertensive urgency 06/17/2016  . Hyperlipidemia 10/29/2013  . CAD (coronary artery disease) 10/29/2013  . DM type 2 causing vascular disease ([place]) 10/29/2013  . Asthma 10/29/2013  . Osteopenia 10/29/2013  . Insomnia 10/29/2013  . Chronic constipation 10/29/2013  . Mild carotid artery disease ([place]) 06/09/2012  . Skin lesion 06/09/2012  . 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 Obstructive sleep apnea 06/09/2012  . Hypertension   . Peripheral vascular disease ([place])   . Coronary artery disease   . Diabetes mellitus [facility])     Standley Brooking, PTA 05/05/2020, 12:11 PM  [facility] [address] [place], [place], [address] Phone:  [phone]   Fax:  [phone]  Name: [name] [name] MRN: [id number] Date of Birth: 10-10-1940

## 2020-05-08 ENCOUNTER — Encounter: Payer: Self-pay | Admitting: Physical Therapy

## 2020-05-08 ENCOUNTER — Other Ambulatory Visit: Payer: Self-pay

## 2020-05-08 ENCOUNTER — Ambulatory Visit: Payer: Medicare Other | Admitting: Physical Therapy

## 2020-05-08 DIAGNOSIS — R2681 Unsteadiness on feet: Secondary | ICD-10-CM | POA: Diagnosis not present

## 2020-05-08 DIAGNOSIS — R2689 Other abnormalities of gait and mobility: Secondary | ICD-10-CM | POA: Diagnosis not present

## 2020-05-08 DIAGNOSIS — M25552 Pain in left hip: Secondary | ICD-10-CM

## 2020-05-08 NOTE — Therapy (Signed)
[facility] [place], [place], [address] Phone: [phone]   Fax:  [phone]  Physical Therapy Treatment  Patient Details  Name: [name] [name] MRN: [id number] Date of Birth: [birth year] Referring Provider (PT): Monia Sabal. Florene Glen, NP   Encounter Date: 05/08/2020   PT End of Session - 05/08/20 0959    Visit Number 5    Number of Visits 12    Date for PT Re-Evaluation 06/11/20    Authorization Type Progress note every 10th visit    PT Start Time 0947    PT Stop Time 1029    PT Time Calculation (min) 42 min    Activity Tolerance Patient tolerated treatment well    Behavior During Therapy [facility] for tasks assessed/performed           Past Medical History:  Diagnosis Date  . Anxiety   . Arthritis    "neck" (03/15/2018)  . CAD (coronary artery disease)    Status post POBA distal LAD, status post Cardiolite Myoview 2008 negative for ischemia  . COPD (chronic obstructive pulmonary disease) ([place])   . Depression   . GERD (gastroesophageal reflux disease)   . Hypercholesterolemia   . Hypertension   . Peripheral vascular disease ([place])     status post iliac stent July 2001   . Type II diabetes mellitus ([place])     Past Surgical History:  Procedure Laterality Date  . ANTERIOR CERVICAL DECOMP/DISCECTOMY FUSION  2001  . BACK SURGERY    . BREAST SURGERY     biopsy left breat-benign  . CATARACT EXTRACTION, BILATERAL Bilateral   . CHOLECYSTECTOMY OPEN    . CORONARY ANGIOPLASTY     POBA distal LAD,  . CORONARY STENT INTERVENTION N/A 03/15/2018   Procedure: CORONARY STENT INTERVENTION;  Surgeon: Jettie Booze, MD;  Location: [place] [facility];  Service: Cardiovascular;  Laterality: N/A;  . DILATION AND CURETTAGE OF UTERUS  1960s  . EYE SURGERY Bilateral    "lasers"  . LEFT HEART CATH AND CORONARY ANGIOGRAPHY N/A 03/15/2018   Procedure: LEFT HEART CATH AND CORONARY ANGIOGRAPHY;  Surgeon: Jettie Booze, MD;   Location: [place] [facility];  Service: Cardiovascular;  Laterality: N/A;  . PERIPHERAL VASCULAR INTERVENTION Right 04/2000    iliac stent   . TUBAL LIGATION      There were no vitals filed for this visit.   Subjective Assessment - 05/08/20 0949    Subjective COVID-19 screening performed upon arrival. Patient denied pain and had no new complaints.    Pertinent History fall risk, HTN, CAD, PVD, COPD, osteopenia, History of CVA 01/2020    Limitations Walking;House hold activities;Standing    How long can you walk comfortably? short distances    Diagnostic tests x-ray: mild degenerative changes of left hip    Patient Stated Goals improve balance and decrease left hip pain    Currently in Pain? No/denies              [facility] PT Assessment - 05/08/20 0001      Assessment   Medical Diagnosis Unspecified abnormalities of gait and mobility    Referring Provider (PT) Gae Gallop C. Florene Glen, NP    Next MD Visit 08/14/2020    Prior Therapy no      Precautions   Precautions Fall                         [place] Adult PT Treatment/Exercise - 05/08/20 0001  Knee/Hip Exercises: Aerobic   Nustep L4 x7 min      Knee/Hip Exercises: Seated   Long Arc Quad Strengthening;Both;20 reps;Weights    Long Arc Quad Weight 3 lbs.    Ball Squeeze x20 reps 5 sec hold    Clamshell with TheraBand Yellow   x20 reps   Marching Both;2 sets;10 reps;Weights    Marching Limitations yellow band    Hamstring Curl Strengthening;Both;10 reps;Limitations;2 sets    Hamstring Limitations red theraband    Sit to Sand 20 reps;without UE support   greater RLE WB than LLE              Balance Exercises - 05/08/20 0001      Balance Exercises: Standing   Standing Eyes Opened Narrow base of support (BOS);Foam/compliant surface;Time    Standing Eyes Opened Time 73min horizontal beam    Tandem Stance Eyes open;Foam/compliant surface;Intermittent upper extremity support;Time   x3 min   Step Ups  Forward;6 inch;UE support 2    Marching Foam/compliant surface;Intermittent upper extremity assist;15 reps;Static    Heel Raises Both;20 reps    Toe Raise Both;20 reps               PT Short Term Goals - 04/23/20 1107      PT SHORT TERM GOAL #1   Title STG=LTG             PT Long Term Goals - 05/08/20 1010      PT LONG TERM GOAL #1   Title Patient will be independent with HEP    Time 6    Period Weeks    Status Achieved      PT LONG TERM GOAL #2   Title Patient will report ability to perform ADLs and home task with left hip/groin pain less than or equal to 2/10    Time 6    Period Weeks    Status Achieved      PT LONG TERM GOAL #3   Title Patient will decrease risk of falls as noted by 46/56 or greater score on the Berg Balance Scale.    Baseline 41/56 baseline BBS    Time 6    Period Weeks    Status On-going      PT LONG TERM GOAL #4   Title Patient will improve functional LE strength as noted by the ability to perform modified 5x sit to stand with UE support in 12 seconds or less.    Baseline 15.5 seconds with UE support    Time 6    Period Weeks    Status Achieved                 Plan - 05/08/20 1124    Clinical Impression Statement Patient presented in clinic with reports of feeling better with less groin/hip pain. Patient fatigued quickly on Nustep again today but able to tolerate for 7 minutes total today. Patient guided through resisted therex with only reports of fatigue. Most goals achieved while in clinic today except BERG score as strengthening has been main focus. Patient more fearful with balance activities on uneven surface.    Personal Factors and Comorbidities Comorbidity 3+;Age;Time since onset of injury/illness/exacerbation    Comorbidities fall risk, HTN, CAD, PVD, COPD, osteopenia, History of CVA 01/2020    Examination-Activity Limitations Locomotion Level;Transfers;Stand;Stairs;Bend;Squat    Stability/Clinical Decision Making  Stable/Uncomplicated    Rehab Potential Good    PT Frequency 2x / week    PT Duration 6 weeks  PT Treatment/Interventions ADLs/Self Care Home Management;Cryotherapy;Electrical Stimulation;Moist Heat;Neuromuscular re-education;Manual techniques;Passive range of motion;Therapeutic exercise;Balance training;Therapeutic activities;Functional mobility training;Stair training;Gait training;Patient/family education    PT Next Visit Plan Progress balance activities.    PT Home Exercise Plan see patient education section    Consulted and Agree with Plan of Care Patient           Patient will benefit from skilled therapeutic intervention in order to improve the following deficits and impairments:  Decreased strength, Decreased activity tolerance, Difficulty walking, Pain, Decreased range of motion, Abnormal gait, Postural dysfunction  Visit Diagnosis: Unsteadiness on feet  Pain in left hip  Other abnormalities of gait and mobility     Problem List Patient Active Problem List   Diagnosis Date Noted  . Hypoglycemia 01/29/2020  . Atypical chest pain 01/29/2020  . Ischemic cerebrovascular accident (CVA) ([place]) 01/28/2020  . Status post coronary artery stent placement   . Angina pectoris ([place]) 03/15/2018  . Chest pain 06/17/2016  . Hypertensive urgency 06/17/2016  . Hyperlipidemia 10/29/2013  . CAD (coronary artery disease) 10/29/2013  . DM type 2 causing vascular disease ([place]) 10/29/2013  . Asthma 10/29/2013  . Osteopenia 10/29/2013  . Insomnia 10/29/2013  . Chronic constipation 10/29/2013  . Mild carotid artery disease ([place]) 06/09/2012  . Skin lesion 06/09/2012  . 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 Obstructive sleep apnea 06/09/2012  . Hypertension   . Peripheral vascular disease ([place])   . Coronary artery disease   . Diabetes mellitus [facility])     Standley Brooking, PTA 05/08/2020, 11:27 AM  [facility] [address] [place], [place],  [address] Phone: [phone]   Fax:  [phone]  Name: [name] [name] MRN: [id number] Date of Birth: May 26, 1941

## 2020-05-12 ENCOUNTER — Ambulatory Visit: Payer: Medicare Other | Admitting: Physical Therapy

## 2020-05-12 ENCOUNTER — Encounter: Payer: Self-pay | Admitting: Physical Therapy

## 2020-05-12 ENCOUNTER — Other Ambulatory Visit: Payer: Self-pay

## 2020-05-12 DIAGNOSIS — M25552 Pain in left hip: Secondary | ICD-10-CM | POA: Diagnosis not present

## 2020-05-12 DIAGNOSIS — R2689 Other abnormalities of gait and mobility: Secondary | ICD-10-CM

## 2020-05-12 DIAGNOSIS — R2681 Unsteadiness on feet: Secondary | ICD-10-CM | POA: Diagnosis not present

## 2020-05-12 NOTE — Therapy (Signed)
[facility] [place], [place], [address] Phone: [phone]   Fax:  [phone]  Physical Therapy Treatment  Patient Details  Name: [name] [name] MRN: [id number] Date of Birth: [birth year] Referring Provider (PT): Monia Sabal. Florene Glen, NP   Encounter Date: 05/12/2020   PT End of Session - 05/12/20 0956    Visit Number 6    Number of Visits 12    Date for PT Re-Evaluation 06/11/20    Authorization Type Progress note every 10th visit    PT Start Time 0948    PT Stop Time 1031    PT Time Calculation (min) 43 min    Activity Tolerance Patient tolerated treatment well    Behavior During Therapy [facility] for tasks assessed/performed           Past Medical History:  Diagnosis Date  . Anxiety   . Arthritis    "neck" (03/15/2018)  . CAD (coronary artery disease)    Status post POBA distal LAD, status post Cardiolite Myoview 2008 negative for ischemia  . COPD (chronic obstructive pulmonary disease) ([place])   . Depression   . GERD (gastroesophageal reflux disease)   . Hypercholesterolemia   . Hypertension   . Peripheral vascular disease ([place])     status post iliac stent July 2001   . Type II diabetes mellitus ([place])     Past Surgical History:  Procedure Laterality Date  . ANTERIOR CERVICAL DECOMP/DISCECTOMY FUSION  2001  . BACK SURGERY    . BREAST SURGERY     biopsy left breat-benign  . CATARACT EXTRACTION, BILATERAL Bilateral   . CHOLECYSTECTOMY OPEN    . CORONARY ANGIOPLASTY     POBA distal LAD,  . CORONARY STENT INTERVENTION N/A 03/15/2018   Procedure: CORONARY STENT INTERVENTION;  Surgeon: Jettie Booze, MD;  Location: [facility];  Service: Cardiovascular;  Laterality: N/A;  . DILATION AND CURETTAGE OF UTERUS  1960s  . EYE SURGERY Bilateral    "lasers"  . LEFT HEART CATH AND CORONARY ANGIOGRAPHY N/A 03/15/2018   Procedure: LEFT HEART CATH AND CORONARY ANGIOGRAPHY;  Surgeon: Jettie Booze, MD;   Location: [facility];  Service: Cardiovascular;  Laterality: N/A;  . PERIPHERAL VASCULAR INTERVENTION Right 04/2000    iliac stent   . TUBAL LIGATION      There were no vitals filed for this visit.   Subjective Assessment - 05/12/20 0955    Subjective COVID-19 screening performed upon arrival. Patient denied pain or difficulty with LEs but still having LUE numbness since CVA.    Pertinent History fall risk, HTN, CAD, PVD, COPD, osteopenia, History of CVA 01/2020    Limitations Walking;House hold activities;Standing    How long can you walk comfortably? short distances    Diagnostic tests x-ray: mild degenerative changes of left hip    Patient Stated Goals improve balance and decrease left hip pain    Currently in Pain? No/denies              [facility] PT Assessment - 05/12/20 0001      Assessment   Medical Diagnosis Unspecified abnormalities of gait and mobility    Referring Provider (PT) Gae Gallop C. Florene Glen, NP    Next MD Visit 08/14/2020    Prior Therapy no      Precautions   Precautions [facility] Adult  PT Treatment/Exercise - 05/12/20 0001      Knee/Hip Exercises: Aerobic   Nustep L5 x8 min      Knee/Hip Exercises: Seated   Long Arc Quad Strengthening;Both;3 sets;10 reps;Weights    Long Arc Quad Weight 4 lbs.    Ball Squeeze x30 reps 5 sec hold    Clamshell with TheraBand Red   x30 reps   Other Seated Knee/Hip Exercises B heel/toe raise x30 reps   deficient L ankle DF noted   Marching Both;2 sets;10 reps;Weights    Marching Limitations red theraband; no UE support    Hamstring Curl Strengthening;Both;3 sets;10 reps;Limitations    Hamstring Limitations red theraband    Sit to Sand 15 reps;without UE support   more RLE WB              Balance Exercises - 05/12/20 0001      Balance Exercises: Standing   Standing Eyes Opened Narrow base of support (BOS);Foam/compliant surface;Time    Standing Eyes Opened Time x3 min      Tandem Stance Eyes open;Foam/compliant surface;Intermittent upper extremity support   x3 min   SLS Eyes open;Solid surface;Upper extremity support 1;1 rep;30 secs    Rockerboard Anterior/posterior;EO;UE support   x1 min   Other Standing Exercises Reaching on floor x10 reps                PT Short Term Goals - 04/23/20 1107      PT SHORT TERM GOAL #1   Title STG=LTG             PT Long Term Goals - 05/08/20 1010      PT LONG TERM GOAL #1   Title Patient will be independent with HEP    Time 6    Period Weeks    Status Achieved      PT LONG TERM GOAL #2   Title Patient will report ability to perform ADLs and home task with left hip/groin pain less than or equal to 2/10    Time 6    Period Weeks    Status Achieved      PT LONG TERM GOAL #3   Title Patient will decrease risk of falls as noted by 46/56 or greater score on the Berg Balance Scale.    Baseline 41/56 baseline BBS    Time 6    Period Weeks    Status On-going      PT LONG TERM GOAL #4   Title Patient will improve functional LE strength as noted by the ability to perform modified 5x sit to stand with UE support in 12 seconds or less.    Baseline 15.5 seconds with UE support    Time 6    Period Weeks    Status Achieved                 Plan - 05/12/20 1041    Clinical Impression Statement Patient presented in clinic with reports of improvement in regards to her LEs. Patient still experiencing some LUE numbness post CVA. Minimal weightshifting deficit to LLE during sit <> stands and continues to reports fatigue especially with strengthening exercises. Patient still very fearful with uneven surface exercises but able to require less UE contact today.    Personal Factors and Comorbidities Comorbidity 3+;Age;Time since onset of injury/illness/exacerbation    Comorbidities fall risk, HTN, CAD, PVD, COPD, osteopenia, History of CVA 01/2020    Examination-Activity Limitations Locomotion  Level;Transfers;Stand;Stairs;Bend;Squat    Stability/Clinical Decision Making Stable/Uncomplicated  Rehab Potential Good    PT Frequency 2x / week    PT Duration 6 weeks    PT Treatment/Interventions ADLs/Self Care Home Management;Cryotherapy;Electrical Stimulation;Moist Heat;Neuromuscular re-education;Manual techniques;Passive range of motion;Therapeutic exercise;Balance training;Therapeutic activities;Functional mobility training;Stair training;Gait training;Patient/family education    PT Next Visit Plan Progress balance activities.    PT Home Exercise Plan see patient education section    Consulted and Agree with Plan of Care Patient           Patient will benefit from skilled therapeutic intervention in order to improve the following deficits and impairments:  Decreased strength, Decreased activity tolerance, Difficulty walking, Pain, Decreased range of motion, Abnormal gait, Postural dysfunction  Visit Diagnosis: Unsteadiness on feet  Pain in left hip  Other abnormalities of gait and mobility     Problem List Patient Active Problem List   Diagnosis Date Noted  . Hypoglycemia 01/29/2020  . Atypical chest pain 01/29/2020  . Ischemic cerebrovascular accident (CVA) ([place]) 01/28/2020  . Status post coronary artery stent placement   . Angina pectoris ([place]) 03/15/2018  . Chest pain 06/17/2016  . Hypertensive urgency 06/17/2016  . Hyperlipidemia 10/29/2013  . CAD (coronary artery disease) 10/29/2013  . DM type 2 causing vascular disease ([place]) 10/29/2013  . Asthma 10/29/2013  . Osteopenia 10/29/2013  . Insomnia 10/29/2013  . Chronic constipation 10/29/2013  . Mild carotid artery disease ([place]) 06/09/2012  . Skin lesion 06/09/2012  . 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 Obstructive sleep apnea 06/09/2012  . Hypertension   . Peripheral vascular disease ([place])   . Coronary artery disease   . Diabetes mellitus [facility])     Standley Brooking, PTA 05/12/2020, 10:50 AM  [facility] [address] [place], [place], [address] Phone: [phone]   Fax:  [phone]  Name: [name] [name] MRN: [id number] Date of Birth: 1940-12-03

## 2020-05-15 ENCOUNTER — Encounter: Payer: Medicare Other | Admitting: Physical Therapy

## 2020-05-16 ENCOUNTER — Other Ambulatory Visit: Payer: Self-pay

## 2020-05-16 ENCOUNTER — Ambulatory Visit: Payer: Medicare Other | Admitting: *Deleted

## 2020-05-16 DIAGNOSIS — R2689 Other abnormalities of gait and mobility: Secondary | ICD-10-CM

## 2020-05-16 DIAGNOSIS — R2681 Unsteadiness on feet: Secondary | ICD-10-CM

## 2020-05-16 DIAGNOSIS — M25552 Pain in left hip: Secondary | ICD-10-CM | POA: Diagnosis not present

## 2020-05-16 NOTE — Therapy (Signed)
[facility] [place], [place], [address] Phone: [phone]   Fax:  [phone]  Physical Therapy Treatment  Patient Details  Name: [name] [name] MRN: [id number] Date of Birth: Nov 15, 1940 Referring Provider (PT): Monia Sabal. Florene Glen, NP   Encounter Date: 05/16/2020   PT End of Session - 05/16/20 1256    Visit Number 7    Number of Visits 12    Date for PT Re-Evaluation 06/11/20    Authorization Type Progress note every 10th visit    PT Start Time 0945    PT Stop Time 1034    PT Time Calculation (min) 49 min           Past Medical History:  Diagnosis Date  . Anxiety   . Arthritis    "neck" (03/15/2018)  . CAD (coronary artery disease)    Status post POBA distal LAD, status post Cardiolite Myoview 2008 negative for ischemia  . COPD (chronic obstructive pulmonary disease) ([place])   . Depression   . GERD (gastroesophageal reflux disease)   . Hypercholesterolemia   . Hypertension   . Peripheral vascular disease ([place])     status post iliac stent July 2001   . Type II diabetes mellitus ([place])     Past Surgical History:  Procedure Laterality Date  . ANTERIOR CERVICAL DECOMP/DISCECTOMY FUSION  2001  . BACK SURGERY    . BREAST SURGERY     biopsy left breat-benign  . CATARACT EXTRACTION, BILATERAL Bilateral   . CHOLECYSTECTOMY OPEN    . CORONARY ANGIOPLASTY     POBA distal LAD,  . CORONARY STENT INTERVENTION N/A 03/15/2018   Procedure: CORONARY STENT INTERVENTION;  Surgeon: Jettie Booze, MD;  Location: [facility];  Service: Cardiovascular;  Laterality: N/A;  . DILATION AND CURETTAGE OF UTERUS  1960s  . EYE SURGERY Bilateral    "lasers"  . LEFT HEART CATH AND CORONARY ANGIOGRAPHY N/A 03/15/2018   Procedure: LEFT HEART CATH AND CORONARY ANGIOGRAPHY;  Surgeon: Jettie Booze, MD;  Location: [facility];  Service: Cardiovascular;  Laterality: N/A;  . PERIPHERAL VASCULAR INTERVENTION Right  04/2000    iliac stent   . TUBAL LIGATION      There were no vitals filed for this visit.   Subjective Assessment - 05/16/20 1000    Subjective COVID-19 screening performed upon arrival. Did good after last Rx    Pertinent History fall risk, HTN, CAD, PVD, COPD, osteopenia, History of CVA 01/2020    Limitations Walking;House hold activities;Standing    How long can you walk comfortably? short distances    Diagnostic tests x-ray: mild degenerative changes of left hip    Patient Stated Goals improve balance and decrease left hip pain    Currently in Pain? No/denies    Pain Location Hip    Pain Orientation Left                             [facility] Adult PT Treatment/Exercise - 05/16/20 0001      Knee/Hip Exercises: Aerobic   Nustep L4 x12 min      Knee/Hip Exercises: Standing   Other Standing Knee Exercises one step holds x 5 each side      Knee/Hip Exercises: Seated   Long Arc Quad Strengthening;Both;3 sets;10 reps;Weights    Long Arc Quad Weight 4 lbs.    Ball Squeeze x20 reps 5 sec hold    Clamshell with  TheraBand Red   x20 reps   Hamstring Curl Strengthening;Both;3 sets;10 reps;Limitations    Hamstring Limitations red theraband    Sit to Sand 15 reps;without UE support   more RLE WB              Balance Exercises - 05/16/20 0001      Balance Exercises: Standing   Standing Eyes Opened Narrow base of support (BOS);Foam/compliant surface;Time    Rockerboard Anterior/posterior   DF/PF 2x10 balance x 16mins with CGA              PT Short Term Goals - 04/23/20 1107      PT SHORT TERM GOAL #1   Title STG=LTG             PT Long Term Goals - 05/08/20 1010      PT LONG TERM GOAL #1   Title Patient will be independent with HEP    Time 6    Period Weeks    Status Achieved      PT LONG TERM GOAL #2   Title Patient will report ability to perform ADLs and home task with left hip/groin pain less than or equal to 2/10    Time 6    Period  Weeks    Status Achieved      PT LONG TERM GOAL #3   Title Patient will decrease risk of falls as noted by 46/56 or greater score on the Berg Balance Scale.    Baseline 41/56 baseline BBS    Time 6    Period Weeks    Status On-going      PT LONG TERM GOAL #4   Title Patient will improve functional LE strength as noted by the ability to perform modified 5x sit to stand with UE support in 12 seconds or less.    Baseline 15.5 seconds with UE support    Time 6    Period Weeks    Status Achieved                 Plan - 05/16/20 1257    Clinical Impression Statement Pt arrived today in good spirits. Rx focused on balance and LE strengthening to improve fuction.. Pt did better with increased WB on LLE during act's and given v/c's to help focus on LT foot during rocker board act's.    Comorbidities fall risk, HTN, CAD, PVD, COPD, osteopenia, History of CVA 01/2020    Stability/Clinical Decision Making Stable/Uncomplicated    Rehab Potential Good    PT Frequency 2x / week    PT Duration 6 weeks    PT Treatment/Interventions ADLs/Self Care Home Management;Cryotherapy;Electrical Stimulation;Moist Heat;Neuromuscular re-education;Manual techniques;Passive range of motion;Therapeutic exercise;Balance training;Therapeutic activities;Functional mobility training;Stair training;Gait training;Patient/family education    PT Next Visit Plan Progress balance activities.    PT Home Exercise Plan see patient education section    Consulted and Agree with Plan of Care Patient           Patient will benefit from skilled therapeutic intervention in order to improve the following deficits and impairments:  Decreased strength, Decreased activity tolerance, Difficulty walking, Pain, Decreased range of motion, Abnormal gait, Postural dysfunction  Visit Diagnosis: Unsteadiness on feet  Pain in left hip  Other abnormalities of gait and mobility     Problem List Patient Active Problem List    Diagnosis Date Noted  . Hypoglycemia 01/29/2020  . Atypical chest pain 01/29/2020  . Ischemic cerebrovascular accident (CVA) ([place]) 01/28/2020  . Status  post coronary artery stent placement   . Angina pectoris ([place]) 03/15/2018  . Chest pain 06/17/2016  . Hypertensive urgency 06/17/2016  . Hyperlipidemia 10/29/2013  . CAD (coronary artery disease) 10/29/2013  . DM type 2 causing vascular disease ([place]) 10/29/2013  . Asthma 10/29/2013  . Osteopenia 10/29/2013  . Insomnia 10/29/2013  . Chronic constipation 10/29/2013  . Mild carotid artery disease ([place]) 06/09/2012  . Skin lesion 06/09/2012  . 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 Obstructive sleep apnea 06/09/2012  . Hypertension   . Peripheral vascular disease ([place])   . Coronary artery disease   . Diabetes mellitus ([place])     Marlyn Tondreau,CHRIS, PTA 05/16/2020, 1:08 PM  [facility] [address] [place], [place], [address] Phone: [phone]   Fax:  [phone]  Name: [name] [name] MRN: [id number] Date of Birth: October 04, 1941

## 2020-05-20 ENCOUNTER — Ambulatory Visit: Payer: Medicare Other | Admitting: Cardiology

## 2020-05-21 ENCOUNTER — Other Ambulatory Visit: Payer: Self-pay

## 2020-05-21 ENCOUNTER — Ambulatory Visit: Payer: Medicare Other | Admitting: Physical Therapy

## 2020-05-21 DIAGNOSIS — M25552 Pain in left hip: Secondary | ICD-10-CM

## 2020-05-21 DIAGNOSIS — R2689 Other abnormalities of gait and mobility: Secondary | ICD-10-CM | POA: Diagnosis not present

## 2020-05-21 DIAGNOSIS — R2681 Unsteadiness on feet: Secondary | ICD-10-CM | POA: Diagnosis not present

## 2020-05-21 NOTE — Therapy (Signed)
[facility] [place], [place], [address] Phone: [phone]   Fax:  [phone]  Physical Therapy Treatment  PHYSICAL THERAPY DISCHARGE SUMMARY  Visits from Start of Care: 8  Current functional level related to goals / functional outcomes: See below   Remaining deficits: See goals   Education / Equipment: HEP Plan: Patient agrees to discharge.  Patient goals were met. Patient is being discharged due to meeting the stated rehab goals.  ?????    Gabriela Eves, PT, DPT   Patient Details  Name: [name] [name] MRN: [id number] Date of Birth: [birth year] Referring Provider (PT): Monia Sabal. Florene Glen, NP   Encounter Date: 05/21/2020   PT End of Session - 05/21/20 1008    Visit Number 8    Number of Visits 12    Date for PT Re-Evaluation 06/11/20    Authorization Type Progress note every 10th visit    PT Start Time 0945    PT Stop Time 1026    PT Time Calculation (min) 41 min    Activity Tolerance Patient tolerated treatment well    Behavior During Therapy [facility] for tasks assessed/performed           Past Medical History:  Diagnosis Date  . Anxiety   . Arthritis    "neck" (03/15/2018)  . CAD (coronary artery disease)    Status post POBA distal LAD, status post Cardiolite Myoview 2008 negative for ischemia  . COPD (chronic obstructive pulmonary disease) ([place])   . Depression   . GERD (gastroesophageal reflux disease)   . Hypercholesterolemia   . Hypertension   . Peripheral vascular disease ([place])     status post iliac stent July 2001   . Type II diabetes mellitus ([place])     Past Surgical History:  Procedure Laterality Date  . ANTERIOR CERVICAL DECOMP/DISCECTOMY FUSION  2001  . BACK SURGERY    . BREAST SURGERY     biopsy left breat-benign  . CATARACT EXTRACTION, BILATERAL Bilateral   . CHOLECYSTECTOMY OPEN    . CORONARY ANGIOPLASTY     POBA distal LAD,  . CORONARY STENT INTERVENTION N/A 03/15/2018    Procedure: CORONARY STENT INTERVENTION;  Surgeon: Jettie Booze, MD;  Location: [place] [facility];  Service: Cardiovascular;  Laterality: N/A;  . DILATION AND CURETTAGE OF UTERUS  1960s  . EYE SURGERY Bilateral    "lasers"  . LEFT HEART CATH AND CORONARY ANGIOGRAPHY N/A 03/15/2018   Procedure: LEFT HEART CATH AND CORONARY ANGIOGRAPHY;  Surgeon: Jettie Booze, MD;  Location: [facility];  Service: Cardiovascular;  Laterality: N/A;  . PERIPHERAL VASCULAR INTERVENTION Right 04/2000    iliac stent   . TUBAL LIGATION      There were no vitals filed for this visit.   Subjective Assessment - 05/21/20 0949    Subjective COVID-19 screening performed upon arrival. Patient reported doing well upon arrival.    Pertinent History fall risk, HTN, CAD, PVD, COPD, osteopenia, History of CVA 01/2020    Limitations Walking;House hold activities;Standing    How long can you walk comfortably? short distances    Diagnostic tests x-ray: mild degenerative changes of left hip    Patient Stated Goals improve balance and decrease left hip pain    Currently in Pain? No/denies              [facility] PT Assessment - 05/21/20 0001      Berg Balance Test   Sit to Stand Able to stand  without using hands and stabilize independently    Standing Unsupported Able to stand safely 2 minutes    Sitting with Back Unsupported but Feet Supported on Floor or Stool Able to sit safely and securely 2 minutes    Stand to Sit Sits safely with minimal use of hands    Transfers Able to transfer safely, minor use of hands    Standing Unsupported with Eyes Closed Able to stand 10 seconds safely    Standing Unsupported with Feet Together Able to place feet together independently and stand 1 minute safely    From Standing, Reach Forward with Outstretched Arm Can reach confidently >25 cm (10")    From Standing Position, Pick up Object from Floor Able to pick up shoe safely and easily    From Standing Position, Turn  to Look Behind Over each Shoulder Looks behind from both sides and weight shifts well    Turn 360 Degrees Able to turn 360 degrees safely in 4 seconds or less    Standing Unsupported, Alternately Place Feet on Step/Stool Able to stand independently and complete 8 steps >20 seconds    Standing Unsupported, One Foot in Front Able to take small step independently and hold 30 seconds    Standing on One Leg Tries to lift leg/unable to hold 3 seconds but remains standing independently    Total Score 50                         [facility] Adult PT Treatment/Exercise - 05/21/20 0001      Knee/Hip Exercises: Aerobic   Nustep L4 x12 min      Knee/Hip Exercises: Seated   Long Arc Quad Strengthening;Both;3 sets;10 reps;Weights    Long Arc Quad Weight 4 lbs.    Ball Squeeze x30 5 sec holds    Clamshell with TheraBand Green   x30   Other Seated Knee/Hip Exercises marching with green t-band 2x10    Marching Strengthening;Left;3 sets;10 reps;Weights    Marching Weights 4 lbs.    Hamstring Curl Strengthening;Both;20 reps    Hamstring Limitations green tband               Balance Exercises - 05/21/20 0001      Balance Exercises: Standing   Standing Eyes Opened Wide (BOA);Solid surface;Head turns   and weight shifting bil sides   Standing Eyes Closed Wide (BOA);1 rep;Solid surface;10 secs    Tandem Stance 4 reps;30 secs;Eyes open    Step Ups 6 inch;Forward   2x10   Marching 20 reps;Solid surface    Heel Raises Both;20 reps    Toe Raise Both;20 reps    Sit to Stand Standard surface   x20            PT Education - 05/21/20 1028    Education Details HEP progression with green band    Person(s) Educated Patient    Methods Explanation;Demonstration;Handout    Comprehension Verbalized understanding;Returned demonstration            PT Short Term Goals - 04/23/20 1107      PT SHORT TERM GOAL #1   Title STG=LTG             PT Long Term Goals - 05/21/20 1006      PT  LONG TERM GOAL #1   Title Patient will be independent with HEP    Time 6    Period Weeks    Status Achieved  PT LONG TERM GOAL #2   Title Patient will report ability to perform ADLs and home task with left hip/groin pain less than or equal to 2/10    Time 6    Period Weeks    Status Achieved      PT LONG TERM GOAL #3   Title Patient will decrease risk of falls as noted by 46/56 or greater score on the Berg Balance Scale.    Baseline Met 50/56 05/21/20    Status Achieved      PT LONG TERM GOAL #4   Title Patient will improve functional LE strength as noted by the ability to perform modified 5x sit to stand with UE support in 12 seconds or less.    Time 6    Period Weeks    Status Achieved                 Plan - 05/21/20 1009    Clinical Impression Statement Patient has met all current goals today and is independent with HEP. Patient has had no falls or LOB and is independent with all ADL's.    Personal Factors and Comorbidities Comorbidity 3+;Age;Time since onset of injury/illness/exacerbation    Comorbidities fall risk, HTN, CAD, PVD, COPD, osteopenia, History of CVA 01/2020    Examination-Activity Limitations Locomotion Level;Transfers;Stand;Stairs;Bend;Squat    Stability/Clinical Decision Making Stable/Uncomplicated    Rehab Potential Good    PT Frequency 2x / week    PT Duration 6 weeks    PT Treatment/Interventions ADLs/Self Care Home Management;Cryotherapy;Electrical Stimulation;Moist Heat;Neuromuscular re-education;Manual techniques;Passive range of motion;Therapeutic exercise;Balance training;Therapeutic activities;Functional mobility training;Stair training;Gait training;Patient/family education    PT Next Visit Plan DC    Consulted and Agree with Plan of Care Patient           Patient will benefit from skilled therapeutic intervention in order to improve the following deficits and impairments:  Decreased strength, Decreased activity tolerance, Difficulty  walking, Pain, Decreased range of motion, Abnormal gait, Postural dysfunction  Visit Diagnosis: Unsteadiness on feet  Pain in left hip  Other abnormalities of gait and mobility     Problem List Patient Active Problem List   Diagnosis Date Noted  . Hypoglycemia 01/29/2020  . Atypical chest pain 01/29/2020  . Ischemic cerebrovascular accident (CVA) ([place]) 01/28/2020  . Status post coronary artery stent placement   . Angina pectoris ([place]) 03/15/2018  . Chest pain 06/17/2016  . Hypertensive urgency 06/17/2016  . Hyperlipidemia 10/29/2013  . CAD (coronary artery disease) 10/29/2013  . DM type 2 causing vascular disease ([place]) 10/29/2013  . Asthma 10/29/2013  . Osteopenia 10/29/2013  . Insomnia 10/29/2013  . Chronic constipation 10/29/2013  . Mild carotid artery disease ([place]) 06/09/2012  . Skin lesion 06/09/2012  . 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 Obstructive sleep apnea 06/09/2012  . Hypertension   . Peripheral vascular disease ([place])   . Coronary artery disease   . Diabetes mellitus [facility])     Ladean Raya, PTA 05/21/20 10:30 AM  [facility] [place], [place], [address] Phone: [phone]   Fax:  [phone]  Name: [name] [name] MRN: [id number] Date of Birth: May 30, 1941

## 2020-05-21 NOTE — Patient Instructions (Signed)
  Half Squat to Chair   Stand with feet shoulder width apart. Push buttocks backward and lower slowly, sitting in chair lightly and returning to standing position. Complete _2_ sets of 10_ repetitions. Perform __2-3_ sessions per day.     Hip abduction   While sitting with good posture, tie theraband around knees and pull apart. Slowly resume starting position. x30 1-2 x day      Knee Flexion: Resisted (Sitting)      SEATED MARCHING - ELASTIC BAND  Start by sitting in a chair with an elastic band wrapped around your lower thighs.  Next, move a knee upward, set it back down and then alternate to the other side. Perform 10-20 1-2x daily   Toe-Up (Ankle Plantar Flexion and Dorsiflexion)   Holding a stable object, rise up on toes. Hold ____ seconds. Then rock back on heels and Hold 2____ seconds. Repeat _5-10___ times. Do _1-2___ sessions per day.   High Stepping   Using support, lift knees, taking high steps. Repeat __5-10__ times. Do __1-2__ sessions per day.

## 2020-05-22 DIAGNOSIS — L218 Other seborrheic dermatitis: Secondary | ICD-10-CM | POA: Diagnosis not present

## 2020-05-23 ENCOUNTER — Encounter: Payer: Medicare Other | Admitting: Physical Therapy

## 2020-05-28 NOTE — Progress Notes (Addendum)
Cardiology Office Note  Date: 05/28/2020   ID: [name], [name] 08-08-1941, MRN [id number]  PCP:  Arsenio Katz, NP  Cardiologist:  Carlyle Dolly, MD Electrophysiologist:  None   Chief Complaint: CAD  History of Present Illness: [name] [name] is a 79 y.o. female with a history of CAD, HTN, PAD, HLD, CVA.  PTCA of the distal LAD 2001.  Remote history of MI 1997 with angioplasty unclear details of intervention.  Echo 2013 LVEF 60 to 65%.  Dobutamine stress echo 2013 reported ST elevation in inferior leads, negative echo images for ischemia.  2019 coronary CTA suggesting severe RCA disease, abnormal FFR in the distal LAD and circumflex.  03/2018 cardiac catheterization DES x3 to RCA.  75% OM1 small vessel managed medically.  History of difficult to treat hypertension mostly due to medication side effects.  History of angioedema on ACE inhibitors and arms, amlodipine leg swelling, clonidine dizziness.  Tried hydralazine in the past with dizziness.  Eventually discontinued, alopecia secondary to Aldactone use.  Home BPs 140s to 150s over 60s, compliant with meds.  Prior iliac stent PAD 2001 2013 ultrasound with ABI.  Right ABI 0.99, left ABI 0.99.  Patent left iliac stent.  Intolerant to statins.  Side effects on PCSK9 inhibitor with palpitations.  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  Acute right frontal.  Neuro recommended DAPT x4 weeks then Plavix only.  Completed aspirin  CAD no anginal symptoms.  Continuing current meds.  Blood pressure remained elevated.  Hydralazine increased to 25 mg p.o. twice daily.  Not on statins due to intolerance to statins and side effects on PCSK9 inhibitor.  To continue Zetia.  Past Medical History:  Diagnosis Date  . Anxiety   . Arthritis    "neck" (03/15/2018)  . CAD (coronary artery disease)    Status post POBA distal LAD, status post Cardiolite Myoview 2008 negative for ischemia  . COPD (chronic obstructive pulmonary disease) ([place])   . Depression   . GERD  (gastroesophageal reflux disease)   . Hypercholesterolemia   . Hypertension   . Peripheral vascular disease ([place])     status post iliac stent July 2001   . Type II diabetes mellitus ([place])     Past Surgical History:  Procedure Laterality Date  . ANTERIOR CERVICAL DECOMP/DISCECTOMY FUSION  2001  . BACK SURGERY    . BREAST SURGERY     biopsy left breat-benign  . CATARACT EXTRACTION, BILATERAL Bilateral   . CHOLECYSTECTOMY OPEN    . CORONARY ANGIOPLASTY     POBA distal LAD,  . CORONARY STENT INTERVENTION N/A 03/15/2018   Procedure: CORONARY STENT INTERVENTION;  Surgeon: Jettie Booze, MD;  Location: [facility];  Service: Cardiovascular;  Laterality: N/A;  . DILATION AND CURETTAGE OF UTERUS  1960s  . EYE SURGERY Bilateral    "lasers"  . LEFT HEART CATH AND CORONARY ANGIOGRAPHY N/A 03/15/2018   Procedure: LEFT HEART CATH AND CORONARY ANGIOGRAPHY;  Surgeon: Jettie Booze, MD;  Location: [facility];  Service: Cardiovascular;  Laterality: N/A;  . PERIPHERAL VASCULAR INTERVENTION Right 04/2000    iliac stent   . TUBAL LIGATION      Current Outpatient Medications  Medication Sig Dispense Refill  . amLODipine (NORVASC) 5 MG tablet TAKE 1 TABLET ONCE DAILY. 90 tablet 2  . betamethasone dipropionate (DIPROLENE) 0.05 % cream Apply 1 application topically 2 (two) times daily.    . bimatoprost (LUMIGAN) 0.01 % SOLN Place 1 drop into both eyes  at bedtime.    . chlorthalidone (HYGROTON) 25 MG tablet TAKE 1/2 TABLET BY MOUTH TWICE WEEKLY ON MONDAYS AND FRIDAYS. 30 tablet 3  . clopidogrel (PLAVIX) 75 MG tablet Take 1 tablet (75 mg total) by mouth daily. 30 tablet 11  . ezetimibe (ZETIA) 10 MG tablet Take 1 tablet (10 mg total) by mouth daily. 90 tablet 1  . hydrALAZINE (APRESOLINE) 25 MG tablet Take 1 tablet (25 mg total) by mouth 2 (two) times daily. 60 tablet 6  . icosapent Ethyl (VASCEPA) 1 g capsule TAKE 1 CAPSULE BY MOUTH TWICE DAILY. 60 capsule 6  . insulin  glargine (LANTUS) 100 UNIT/ML injection Inject 12-47 Units into the skin See admin instructions. Inject 12 units SQ every morning and inject 47 units SQ every evening around 1600    . insulin lispro (HUMALOG) 100 UNIT/ML injection Inject 16 Units into the skin 3 (three) times daily with meals.     [name] labetalol (NORMODYNE) 200 MG tablet TAKE (1) TABLET TWICE DAILY. 60 tablet 6  . magnesium oxide (MAG-OX) 400 MG tablet Take 400 mg by mouth daily.     . metFORMIN (GLUCOPHAGE) 1000 MG tablet Take 1,000 mg by mouth as needed. As needed    . nitroGLYCERIN (NITROSTAT) 0.4 MG SL tablet Place 1 tablet (0.4 mg total) under the tongue every 5 (five) minutes as needed for chest pain. 25 tablet 12  . oxyCODONE-acetaminophen (PERCOCET) 10-325 MG tablet Take 1 tablet by mouth 2 (two) times daily.    . SYMBICORT 160-4.5 MCG/ACT inhaler Inhale 2 puffs into the lungs daily.      No current facility-administered medications for this visit.   Allergies:  Alendronate, Clonidine derivatives, Crestor [rosuvastatin], Lisinopril, Losartan, Codeine, and Hydrocodone   Social History: The patient  reports that she quit smoking about 24 years ago. Her smoking use included cigarettes. She started smoking about 58 years ago. She has a 20.00 pack-year smoking history. She has never used smokeless tobacco. She reports that she does not drink alcohol and does not use drugs.   Family History: The patient's family history includes COPD in her brother; Diabetes in her mother; Heart failure in her father and mother.   ROS:  Please see the history of present illness. Otherwise, complete review of systems is positive for none.  All other systems are reviewed and negative.   Physical Exam: VS:  There were no vitals taken for this visit., BMI There is no height or weight on file to calculate BMI.  Wt Readings from Last 3 Encounters:  03/06/20 174 lb (78.9 kg)  01/28/20 171 lb 11.8 oz (77.9 kg)  08/24/19 174 lb (78.9 kg)    General:  Patient appears comfortable at rest. Neck: Supple, no elevated JVP or carotid bruits, no thyromegaly. Lungs: Clear to auscultation, nonlabored breathing at rest. Cardiac: Regular rate and rhythm, no S3 or significant systolic murmur, no pericardial rub. Extremities: No pitting edema, distal pulses 2+. Skin: Warm and dry. Musculoskeletal: No kyphosis. Neuropsychiatric: Alert and oriented x3, affect grossly appropriate.  ECG:  EKG 01/28/2020 shows sinus rhythm with a rate of 61, borderline T wave abnormalities.  Recent Labwork: 01/28/2020: ALT 20; AST 19 01/29/2020: BUN 14; Creatinine, Ser 0.71; Hemoglobin 12.7; Magnesium 1.9; Platelets 271; Potassium 3.6; Sodium 134     Component Value Date/Time   CHOL 235 (H) 01/29/2020 0603   TRIG 225 (H) 01/29/2020 0603   HDL 37 (L) 01/29/2020 0603   CHOLHDL 6.4 01/29/2020 0603   VLDL 45 (H)  01/29/2020 0603   LDLCALC 153 (H) 01/29/2020 0603    Other Studies Reviewed Today:  Diagnostic Studies  11/1999 Cath HEMODYNAMIC DATA: Aortic pressure was initially extremely high at 215/92. She  was  given intravenous enalaprilat, intravenous labetalol, and intravenous  nitroglycerin, ultimately bringing her systolic blood pressure down to less  than  160. Left ventricular pressure was 180/17, with a corresponding aortic  pressure of  180/80. There was no aortic valve gradient.  LEFT VENTRICULOGRAM: Wall motion was normal. Ejection fraction was estimated  t  65%. No mitral regurgitation.  ABDOMINAL AORTOGRAM: This revealed patent renal arteries and abdominal aorta.  The  left common iliac artery had a 75% stenosis. The right external iliac artery  had a  40% stenosis.  CORONARY ARTERIOGRAPHY (Right dominant):  1. The left main was normal.  2. The left anterior descending artery had a 25% stenosis in the proximal to  mid  vessel. The distal LAD had a 50% stenosis just after the bifurcation of the  large second diagonal  branch. Further down the distal LAD is a focal 80%  stenosis.  3. The left circumflex had a 20% stenosis in the mid vessel. It gave rise to a  small OM-1, a normal sized OM-2 which had a 25% stenosis, and a normal sized  OM-3 which had a 20% stenosis.  4. The right coronary artery had a diffuse 40% stenosis extending from the mid  o  distal vessel. There was a large posterior descending artery which had a  25%,  followed by a 40% stenosis. There were two small posterolateral branches.  IMPRESSIONS:  1. Normal left ventricular systolic function.  2. Peripheral vascular disease, as described.  3. One-vessel coronary artery disease with significant stenosis in the distal  left  anterior descending artery. This appears to correspond with her EKG changes  and  Cardiolite scan. She has moderate, but non-flow limiting disease in the  right coronary and left circumflex.  PLAN: Percutaneous intervention of the distal LAD, see below.  PTCA PROCEDURAL NOTE: Following the completion of the diagnostic  catheterization,  we opted to proceed with percutaneous intervention. The preexisting 6-French  sheath in the right femoral artery was exchanged over a wire for a 7-French  sheath.  Integrilin and heparin were administered per protocol. We used a 7-French JL-4  guiding catheter and a BMW wire. The reference vessel was approximately 2 mm  in  diameter. We utilized a 2.0 x 20 mm CrossSail balloon, which was inflated  initially to 12 atm x 2 minutes and then 14 atm x 3 minutes. Final  angiographic  images revealed significant improvement in the lumen with 25% residual stenosis  and  a possible very small non-flow limiting dissection. There was TIMI-3 flow into  the  distal vessel.  COMPLICATIONS: None.  RESULTS: Successful percutaneous transluminal coronary angioplasty of the  distal  left anterior descending artery, reducing an 80% stenosis to 25% residual  with  TIMI-3 flow.  PLAN: Integrilin will be continued for 20 hours. The patient needs aggressive  blood pressure control and risk factor reduction.  In regards to her peripheral vascular disease, she will be referred for further  evaluation and possible intervention.  03/2012 Echo LVEF 60-65%, no significant abnormalities   02/19/16 Clinic EKG (performed and reviewed in clinic): sinus bradycardia, no ischemic changes   02/2018 coronary CTA/FFR FINDINGS: FFR < 0.5 distal RCA suggesting severe mid RCA stenosis.  FFR 0.83 mid LAD suggesting non-hemodynamically significant mid LAD  stenosis.  FFR 0.66 distal LAD suggesting significant distal LAD stenosis (versus cumulative effect of diffuse disease in LAD).  FFR 0.67 distal LCx suggesting significant stenosis mid to distal LCx (versus cumulative effect of diffuse disease in LCx).  IMPRESSION: 1. Suspect severe mid RCA stenosis.  2. FFR significantly low in distal LAD and distal LCx. This could suggest a single hemodynamically significant stenosis versus the cumulative effect of a long area of disease.  Suggest cardiac cath.  03/2018 cath  Dist RCA lesion is 75% stenosed.  Mid RCA lesion is 99% stenosed.  Prox RCA to Mid RCA lesion is 60% stenosed.  Drug-eluting stent was successfully placed: STENT SYNERGY DES 3.5X38., 4.0 x 28 mm and 4.0 x 12 mm.  Post intervention, there is a 0% residual stenosis.  Ost 1st Mrg lesion is 50% stenosed.  1st Mrg lesion is 75% stenosed. This was a small vessel.  Mid Cx lesion is 60% stenosed. THis was a fairly small vessel.  Mid LAD lesion is 60% stenosed. THis was not significant by CT FFR.  Prox LAD to Mid LAD lesion is 25% stenosed.  2nd Diag lesion is 75% stenosed.  The left ventricular systolic function is normal.  LV end diastolic pressure is normal.  There is no aortic valve stenosis.    Assessment and Plan:  1. CAD in native artery   2. Essential  hypertension   3. Mixed hyperlipidemia    1. CAD in native artery Denies any progressive anginal symptoms.  Denies any nitroglycerin use.  Continue Plavix 75 mg daily.  Nitroglycerin 0.4 mg sublingual as needed.  2. Essential hypertension Blood pressure initially elevated on arrival today at 172/74.  Recheck in right arm 140/78.  She brings with her recent blood pressure measurements at home with blood pressure ranging around 135/64.  Continue chlorthalidone 12.5 mg p.o. twice daily on Mondays and Fridays.  Continue hydralazine 25 mg p.o. twice daily.  Continue labetalol 200 mg p.o. twice daily.  3. Mixed hyperlipidemia Patient is intolerant to statins.  Continue Vascepa 1 g capsule p.o. twice daily.  Continue Zetia 10 mg daily.  Medication Adjustments/Labs and Tests Ordered: Current medicines are reviewed at length with the patient today.  Concerns regarding medicines are outlined above.   Disposition: Follow-up with Dr. Harl Bowie or APP 6 months.  Signed, Levell July, NP 05/28/2020 8:30 PM    [facility] [place], [place],  [address] Phone: [phone]; Fax: [phone]

## 2020-05-29 ENCOUNTER — Ambulatory Visit (INDEPENDENT_AMBULATORY_CARE_PROVIDER_SITE_OTHER): Payer: Medicare Other | Admitting: Family Medicine

## 2020-05-29 ENCOUNTER — Encounter: Payer: Self-pay | Admitting: Family Medicine

## 2020-05-29 VITALS — BP 140/78 | HR 57 | Ht 65.0 in | Wt 171.2 lb

## 2020-05-29 DIAGNOSIS — I2510 Atherosclerotic heart disease of native coronary artery without angina pectoris: Secondary | ICD-10-CM

## 2020-05-29 DIAGNOSIS — E782 Mixed hyperlipidemia: Secondary | ICD-10-CM

## 2020-05-29 DIAGNOSIS — I10 Essential (primary) hypertension: Secondary | ICD-10-CM

## 2020-05-29 NOTE — Patient Instructions (Signed)
Medication Instructions:  Continue all current medications.   Labwork: none  Testing/Procedures: none  Follow-Up: 6 months   Any Other Special Instructions Will Be Listed Below (If Applicable).   If you need a refill on your cardiac medications before your next appointment, please call your pharmacy.  

## 2020-05-30 DIAGNOSIS — Z79899 Other long term (current) drug therapy: Secondary | ICD-10-CM | POA: Diagnosis not present

## 2020-05-30 DIAGNOSIS — E1165 Type 2 diabetes mellitus with hyperglycemia: Secondary | ICD-10-CM | POA: Diagnosis not present

## 2020-05-30 DIAGNOSIS — M542 Cervicalgia: Secondary | ICD-10-CM | POA: Diagnosis not present

## 2020-05-30 DIAGNOSIS — Z299 Encounter for prophylactic measures, unspecified: Secondary | ICD-10-CM | POA: Diagnosis not present

## 2020-05-30 DIAGNOSIS — E1142 Type 2 diabetes mellitus with diabetic polyneuropathy: Secondary | ICD-10-CM | POA: Diagnosis not present

## 2020-05-30 DIAGNOSIS — G8929 Other chronic pain: Secondary | ICD-10-CM | POA: Diagnosis not present

## 2020-05-30 DIAGNOSIS — I10 Essential (primary) hypertension: Secondary | ICD-10-CM | POA: Diagnosis not present

## 2020-06-03 DIAGNOSIS — I10 Essential (primary) hypertension: Secondary | ICD-10-CM | POA: Diagnosis not present

## 2020-06-08 ENCOUNTER — Other Ambulatory Visit: Payer: Self-pay | Admitting: Cardiology

## 2020-06-12 DIAGNOSIS — L708 Other acne: Secondary | ICD-10-CM | POA: Diagnosis not present

## 2020-06-12 DIAGNOSIS — L820 Inflamed seborrheic keratosis: Secondary | ICD-10-CM | POA: Diagnosis not present

## 2020-06-12 DIAGNOSIS — L218 Other seborrheic dermatitis: Secondary | ICD-10-CM | POA: Diagnosis not present

## 2020-06-19 DIAGNOSIS — I10 Essential (primary) hypertension: Secondary | ICD-10-CM | POA: Diagnosis not present

## 2020-07-01 DIAGNOSIS — G8929 Other chronic pain: Secondary | ICD-10-CM | POA: Diagnosis not present

## 2020-07-01 DIAGNOSIS — E1165 Type 2 diabetes mellitus with hyperglycemia: Secondary | ICD-10-CM | POA: Diagnosis not present

## 2020-07-01 DIAGNOSIS — Z79899 Other long term (current) drug therapy: Secondary | ICD-10-CM | POA: Diagnosis not present

## 2020-07-01 DIAGNOSIS — R350 Frequency of micturition: Secondary | ICD-10-CM | POA: Diagnosis not present

## 2020-07-01 DIAGNOSIS — Z299 Encounter for prophylactic measures, unspecified: Secondary | ICD-10-CM | POA: Diagnosis not present

## 2020-07-01 DIAGNOSIS — M542 Cervicalgia: Secondary | ICD-10-CM | POA: Diagnosis not present

## 2020-07-07 DIAGNOSIS — J449 Chronic obstructive pulmonary disease, unspecified: Secondary | ICD-10-CM | POA: Diagnosis not present

## 2020-07-07 DIAGNOSIS — Z299 Encounter for prophylactic measures, unspecified: Secondary | ICD-10-CM | POA: Diagnosis not present

## 2020-07-07 DIAGNOSIS — J441 Chronic obstructive pulmonary disease with (acute) exacerbation: Secondary | ICD-10-CM | POA: Diagnosis not present

## 2020-07-07 DIAGNOSIS — I779 Disorder of arteries and arterioles, unspecified: Secondary | ICD-10-CM | POA: Diagnosis not present

## 2020-07-07 DIAGNOSIS — I4891 Unspecified atrial fibrillation: Secondary | ICD-10-CM | POA: Diagnosis not present

## 2020-07-31 DIAGNOSIS — G8929 Other chronic pain: Secondary | ICD-10-CM | POA: Diagnosis not present

## 2020-07-31 DIAGNOSIS — I10 Essential (primary) hypertension: Secondary | ICD-10-CM | POA: Diagnosis not present

## 2020-07-31 DIAGNOSIS — Z79899 Other long term (current) drug therapy: Secondary | ICD-10-CM | POA: Diagnosis not present

## 2020-07-31 DIAGNOSIS — Z299 Encounter for prophylactic measures, unspecified: Secondary | ICD-10-CM | POA: Diagnosis not present

## 2020-07-31 DIAGNOSIS — M542 Cervicalgia: Secondary | ICD-10-CM | POA: Diagnosis not present

## 2020-07-31 DIAGNOSIS — E1165 Type 2 diabetes mellitus with hyperglycemia: Secondary | ICD-10-CM | POA: Diagnosis not present

## 2020-08-02 DIAGNOSIS — I10 Essential (primary) hypertension: Secondary | ICD-10-CM | POA: Diagnosis not present

## 2020-08-06 ENCOUNTER — Other Ambulatory Visit: Payer: Self-pay | Admitting: Cardiology

## 2020-08-13 DIAGNOSIS — Z961 Presence of intraocular lens: Secondary | ICD-10-CM | POA: Diagnosis not present

## 2020-08-13 DIAGNOSIS — H401134 Primary open-angle glaucoma, bilateral, indeterminate stage: Secondary | ICD-10-CM | POA: Diagnosis not present

## 2020-08-13 DIAGNOSIS — E113293 Type 2 diabetes mellitus with mild nonproliferative diabetic retinopathy without macular edema, bilateral: Secondary | ICD-10-CM | POA: Diagnosis not present

## 2020-08-13 DIAGNOSIS — H353111 Nonexudative age-related macular degeneration, right eye, early dry stage: Secondary | ICD-10-CM | POA: Diagnosis not present

## 2020-08-13 DIAGNOSIS — Z794 Long term (current) use of insulin: Secondary | ICD-10-CM | POA: Diagnosis not present

## 2020-09-01 DIAGNOSIS — Z299 Encounter for prophylactic measures, unspecified: Secondary | ICD-10-CM | POA: Diagnosis not present

## 2020-09-01 DIAGNOSIS — E1165 Type 2 diabetes mellitus with hyperglycemia: Secondary | ICD-10-CM | POA: Diagnosis not present

## 2020-09-01 DIAGNOSIS — I10 Essential (primary) hypertension: Secondary | ICD-10-CM | POA: Diagnosis not present

## 2020-09-01 DIAGNOSIS — E1142 Type 2 diabetes mellitus with diabetic polyneuropathy: Secondary | ICD-10-CM | POA: Diagnosis not present

## 2020-09-01 DIAGNOSIS — Z79899 Other long term (current) drug therapy: Secondary | ICD-10-CM | POA: Diagnosis not present

## 2020-09-02 ENCOUNTER — Other Ambulatory Visit (HOSPITAL_COMMUNITY): Payer: Self-pay | Admitting: Neurology

## 2020-09-02 ENCOUNTER — Other Ambulatory Visit: Payer: Self-pay

## 2020-09-02 ENCOUNTER — Ambulatory Visit (HOSPITAL_COMMUNITY)
Admission: RE | Admit: 2020-09-02 | Discharge: 2020-09-02 | Disposition: A | Discharge status: Home / Self Care | Payer: Medicare Other | Source: Ambulatory Visit | Attending: Neurology | Admitting: Neurology

## 2020-09-02 DIAGNOSIS — R269 Unspecified abnormalities of gait and mobility: Secondary | ICD-10-CM | POA: Diagnosis not present

## 2020-09-02 DIAGNOSIS — M25552 Pain in left hip: Secondary | ICD-10-CM | POA: Diagnosis not present

## 2020-09-02 DIAGNOSIS — I6930 Unspecified sequelae of cerebral infarction: Secondary | ICD-10-CM | POA: Diagnosis not present

## 2020-09-02 DIAGNOSIS — I69393 Ataxia following cerebral infarction: Secondary | ICD-10-CM | POA: Diagnosis not present

## 2020-09-02 DIAGNOSIS — M5459 Other low back pain: Secondary | ICD-10-CM

## 2020-09-02 DIAGNOSIS — I10 Essential (primary) hypertension: Secondary | ICD-10-CM | POA: Diagnosis not present

## 2020-09-02 DIAGNOSIS — M545 Low back pain, unspecified: Secondary | ICD-10-CM | POA: Diagnosis not present

## 2020-10-01 DIAGNOSIS — M542 Cervicalgia: Secondary | ICD-10-CM | POA: Diagnosis not present

## 2020-10-01 DIAGNOSIS — I10 Essential (primary) hypertension: Secondary | ICD-10-CM | POA: Diagnosis not present

## 2020-10-01 DIAGNOSIS — G8929 Other chronic pain: Secondary | ICD-10-CM | POA: Diagnosis not present

## 2020-10-01 DIAGNOSIS — E1165 Type 2 diabetes mellitus with hyperglycemia: Secondary | ICD-10-CM | POA: Diagnosis not present

## 2020-10-01 DIAGNOSIS — Z299 Encounter for prophylactic measures, unspecified: Secondary | ICD-10-CM | POA: Diagnosis not present

## 2020-10-01 DIAGNOSIS — I4891 Unspecified atrial fibrillation: Secondary | ICD-10-CM | POA: Diagnosis not present

## 2020-10-02 DIAGNOSIS — I10 Essential (primary) hypertension: Secondary | ICD-10-CM | POA: Diagnosis not present

## 2020-10-13 ENCOUNTER — Ambulatory Visit: Payer: Medicare Other | Admitting: Cardiology

## 2020-10-13 ENCOUNTER — Encounter: Payer: Self-pay | Admitting: Family Medicine

## 2020-10-13 ENCOUNTER — Ambulatory Visit (INDEPENDENT_AMBULATORY_CARE_PROVIDER_SITE_OTHER): Payer: Medicare Other | Admitting: Family Medicine

## 2020-10-13 VITALS — BP 160/70 | HR 72 | Ht 65.0 in | Wt 175.4 lb

## 2020-10-13 DIAGNOSIS — E782 Mixed hyperlipidemia: Secondary | ICD-10-CM | POA: Diagnosis not present

## 2020-10-13 DIAGNOSIS — I10 Essential (primary) hypertension: Secondary | ICD-10-CM

## 2020-10-13 DIAGNOSIS — I739 Peripheral vascular disease, unspecified: Secondary | ICD-10-CM | POA: Diagnosis not present

## 2020-10-13 DIAGNOSIS — M79606 Pain in leg, unspecified: Secondary | ICD-10-CM | POA: Diagnosis not present

## 2020-10-13 DIAGNOSIS — I2510 Atherosclerotic heart disease of native coronary artery without angina pectoris: Secondary | ICD-10-CM

## 2020-10-13 NOTE — Progress Notes (Signed)
Cardiology Office Note  Date: 10/13/2020   ID: [name] [name], DOB 1940/12/28, MRN [id number]  PCP:  [facility]  Cardiologist:  Carlyle Dolly, MD Electrophysiologist:  None   Chief Complaint: CAD  History of Present Illness: [name] [name] is a 80 y.o. female with a history of CAD, HTN, PAD, HLD, CVA.   I last saw her in August on the 26 2021 PTCA of the distal LAD 2001.  Remote history of MI 1997 with angioplasty unclear details of intervention.  Echo 2013 LVEF 60 to 65%.  Dobutamine stress echo 2013 reported ST elevation in inferior leads, negative echo images for ischemia.  2019 coronary CTA suggesting severe RCA disease, abnormal FFR in the distal LAD and circumflex.  03/2018 cardiac catheterization DES x3 to RCA.  75% OM1 small vessel managed medically.  History of difficult to treat hypertension mostly due to medication side effects.  History of angioedema on ACE inhibitors and arms, amlodipine leg swelling, clonidine dizziness.  Tried hydralazine in the past with dizziness.  Eventually discontinued D/T alopecia secondary to Aldactone use.  Home BPs 140s to 150s over 60s, compliant with meds.  Prior iliac stent PAD 2001 2013 ultrasound with ABI.  Right ABI 0.99, left ABI 0.99.  Patent left iliac stent.  Intolerant to statins.  Side effects on PCSK9 inhibitor with palpitations.  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  Acute right frontal.  Neuro recommended DAPT x4 weeks then Plavix only.  Completed aspirin  She was having no no anginal symptoms.  Continuing current meds.  Blood pressure remained elevated.  Hydralazine increased to 25 mg p.o. twice daily.  Not on statins due to intolerance to statins and side effects on PCSK9 inhibitor.  To continue Zetia.  She is here for 27-monthfollow-up today. She is complaining of some pain in her right upper thigh lateral aspect worse when deep pressure applied. Also complaining of bilateral leg pain worse when walking and relieved at rest. It  appears in the past he has had a previous lower arterial study ordered but not done. I see no results in the system. Otherwise she denies any anginal or exertional symptoms other than some mild dyspnea which resolves after she pauses for 20 to 30 s and resumes activity. Denies any palpitations or arrhythmias, orthostatic symptoms, CVA or TIA-like symptoms, PND, orthopnea. Denies any DVT or PE-like symptoms or lower extremity edema. 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 States she has been tried on gabapentin and Cymbalta and was unable to tolerate. Blood pressure elevated on arrival. Patient states her systolic blood pressures at home are usually around 119-120.  Past Medical History:  Diagnosis Date  . Anxiety   . Arthritis    "neck" (03/15/2018)  . CAD (coronary artery disease)    Status post POBA distal LAD, status post Cardiolite Myoview 2008 negative for ischemia  . COPD (chronic obstructive pulmonary disease) ([place]   . Depression   . GERD (gastroesophageal reflux disease)   . Hypercholesterolemia   . Hypertension   . Peripheral vascular disease ([facility] [place]     status post iliac stent July 2001   . Type II diabetes mellitus ([facility]     Past Surgical History:  Procedure Laterality Date  . ANTERIOR CERVICAL DECOMP/DISCECTOMY FUSION  2001  . BACK SURGERY    . BREAST SURGERY     biopsy left breat-benign  . CATARACT EXTRACTION, BILATERAL Bilateral   . CHOLECYSTECTOMY  OPEN    . CORONARY ANGIOPLASTY     POBA distal LAD,  . CORONARY STENT INTERVENTION N/A 03/15/2018   Procedure: CORONARY STENT INTERVENTION;  Surgeon: Jettie Booze, MD;  Location: [facility];  Service: Cardiovascular;  Laterality: N/A;  . DILATION AND CURETTAGE OF UTERUS  1960s  . EYE SURGERY Bilateral    "lasers"  . LEFT HEART CATH AND CORONARY ANGIOGRAPHY N/A 03/15/2018   Procedure: LEFT HEART CATH AND CORONARY ANGIOGRAPHY;   Surgeon: Jettie Booze, MD;  Location: [facility];  Service: Cardiovascular;  Laterality: N/A;  . PERIPHERAL VASCULAR INTERVENTION Right 04/2000    iliac stent   . TUBAL LIGATION      Current Outpatient Medications  Medication Sig Dispense Refill  . amLODipine (NORVASC) 5 MG tablet TAKE 1 TABLET BY MOUTH ONCE DAILY. 30 tablet 6  . bimatoprost (LUMIGAN) 0.01 % SOLN Place 1 drop into both eyes at bedtime.    . chlorthalidone (HYGROTON) 25 MG tablet TAKE 1/2 TABLET BY MOUTH TWICE WEEKLY ON MONDAYS AND FRIDAYS. 30 tablet 3  . clopidogrel (PLAVIX) 75 MG tablet Take 1 tablet (75 mg total) by mouth daily. 30 tablet 11  . ezetimibe (ZETIA) 10 MG tablet TAKE 1 TABLET ONCE DAILY. 30 tablet 11  . hydrALAZINE (APRESOLINE) 25 MG tablet Take 1 tablet (25 mg total) by mouth 2 (two) times daily. 60 tablet 6  . icosapent Ethyl (VASCEPA) 1 g capsule TAKE 1 CAPSULE BY MOUTH TWICE DAILY. 60 capsule 6  . insulin glargine (LANTUS) 100 UNIT/ML injection Inject 12-47 Units into the skin See admin instructions. Inject 12 units SQ every morning and inject 47 units SQ every evening around 1600    . insulin lispro (HUMALOG) 100 UNIT/ML injection Inject 16 Units into the skin 3 (three) times daily with meals.     [name] labetalol (NORMODYNE) 200 MG tablet TAKE (1) TABLET TWICE DAILY. 60 tablet 6  . magnesium oxide (MAG-OX) 400 MG tablet Take 400 mg by mouth daily.     . nitroGLYCERIN (NITROSTAT) 0.4 MG SL tablet Place 1 tablet (0.4 mg total) under the tongue every 5 (five) minutes as needed for chest pain. 25 tablet 12  . oxyCODONE-acetaminophen (PERCOCET) 10-325 MG tablet Take 1 tablet by mouth 2 (two) times daily.    . Semaglutide (OZEMPIC, 0.25 OR 0.5 MG/DOSE, [place]) Inject into the skin once a week.     No current facility-administered medications for this visit.   Allergies:  Alendronate, Clonidine derivatives, Crestor [rosuvastatin], Lisinopril, Losartan, Codeine, and Hydrocodone   Social History: The  patient  reports that she quit smoking about 25 years ago. Her smoking use included cigarettes. She started smoking about 58 years ago. She has a 20.00 pack-year smoking history. She has never used smokeless tobacco. She reports that she does not drink alcohol and does not use drugs.   Family History: The patient's family history includes COPD in her brother; Diabetes in her mother; Heart failure in her father and mother.   ROS:  Please see the history of present illness. Otherwise, complete review of systems is positive for none.  All other systems are reviewed and negative.   Physical Exam: VS:  BP (!) 160/70   Pulse 72   Ht _0  (1.651 m)   Wt 175 lb 6.4 oz (79.6 kg)   SpO2 95%   BMI 29.19 kg/m , BMI Body mass index is 29.19 kg/m.  Wt Readings from Last 3 Encounters:  10/13/20 175  lb 6.4 oz (79.6 kg)  05/29/20 171 lb 3.2 oz (77.7 kg)  03/06/20 174 lb (78.9 kg)    General: Patient appears comfortable at rest. Neck: Supple, no elevated JVP or carotid bruits, no thyromegaly. Lungs: Clear to auscultation, nonlabored breathing at rest. Cardiac: Regular rate and rhythm, no S3 or significant systolic murmur, no pericardial rub. Extremities: No pitting edema, distal pulses 2+. Skin: Warm and dry. Musculoskeletal: No kyphosis. Neuropsychiatric: Alert and oriented x3, affect grossly appropriate.  ECG:  EKG 01/28/2020 shows sinus rhythm with a rate of 61, borderline T wave abnormalities.  Recent Labwork: 01/28/2020: ALT 20; AST 19 01/29/2020: BUN 14; Creatinine, Ser 0.71; Hemoglobin 12.7; Magnesium 1.9; Platelets 271; Potassium 3.6; Sodium 134     Component Value Date/Time   CHOL 235 (H) 01/29/2020 0603   TRIG 225 (H) 01/29/2020 0603   HDL 37 (L) 01/29/2020 0603   CHOLHDL 6.4 01/29/2020 0603   VLDL 45 (H) 01/29/2020 0603   LDLCALC 153 (H) 01/29/2020 0603    Other Studies Reviewed Today:  Echocardiogram 01/29/2020 1. Left ventricular ejection fraction, by estimation, is 65 to 70%.  The left ventricle has normal function. The left ventricle has no regional wall motion abnormalities. There is mild left ventricular hypertrophy. Left ventricular diastolic parameters are indeterminate. 2. Right ventricular systolic function is normal. The right ventricular size is normal. Tricuspid regurgitation signal is inadequate for assessing PA pressure. 3. The mitral valve is grossly normal. Trivial mitral valve regurgitation. 4. The aortic valve is tricuspid. Aortic valve regurgitation is not visualized. Mild aortic valve sclerosis is present, with no evidence of aortic valve stenosis. 5. The inferior vena cava is normal in size with greater than 50% respiratory variability, suggesting right atrial pressure of 3 mmHg. 6. Agitated saline contrast bubble study was negative, with no evidence of any interatrial shunt.   Diagnostic Studies 11/1999 Cath HEMODYNAMIC DATA: Aortic pressure was initially extremely high at 215/92. She  was  given intravenous enalaprilat, intravenous labetalol, and intravenous  nitroglycerin, ultimately bringing her systolic blood pressure down to less  than  160. Left ventricular pressure was 180/17, with a corresponding aortic  pressure of  180/80. There was no aortic valve gradient.  LEFT VENTRICULOGRAM: Wall motion was normal. Ejection fraction was estimated  t  65%. No mitral regurgitation.  ABDOMINAL AORTOGRAM: This revealed patent renal arteries and abdominal aorta.  The  left common iliac artery had a 75% stenosis. The right external iliac artery  had a  40% stenosis.  CORONARY ARTERIOGRAPHY (Right dominant):  1. The left main was normal.  2. The left anterior descending artery had a 25% stenosis in the proximal to  mid  vessel. The distal LAD had a 50% stenosis just after the bifurcation of the  large second diagonal branch. Further down the distal LAD is a focal 80%  stenosis.  3. The left circumflex had a 20%  stenosis in the mid vessel. It gave rise to a  small OM-1, a normal sized OM-2 which had a 25% stenosis, and a normal sized  OM-3 which had a 20% stenosis.  4. The right coronary artery had a diffuse 40% stenosis extending from the mid  o  distal vessel. There was a large posterior descending artery which had a  25%,  followed by a 40% stenosis. There were two small posterolateral branches.  IMPRESSIONS:  1. Normal left ventricular systolic function.  2. Peripheral vascular disease, as described.  3. One-vessel coronary artery disease with significant stenosis in  the distal  left  anterior descending artery. This appears to correspond with her EKG changes  and  Cardiolite scan. She has moderate, but non-flow limiting disease in the  right coronary and left circumflex.  PLAN: Percutaneous intervention of the distal LAD, see below.  PTCA PROCEDURAL NOTE: Following the completion of the diagnostic  catheterization,  we opted to proceed with percutaneous intervention. The preexisting 6-French  sheath in the right femoral artery was exchanged over a wire for a 7-French  sheath.  Integrilin and heparin were administered per protocol. We used a 7-French JL-4  guiding catheter and a BMW wire. The reference vessel was approximately 2 mm  in  diameter. We utilized a 2.0 x 20 mm CrossSail balloon, which was inflated  initially to 12 atm x 2 minutes and then 14 atm x 3 minutes. Final  angiographic  images revealed significant improvement in the lumen with 25% residual stenosis  and  a possible very small non-flow limiting dissection. There was TIMI-3 flow into  the  distal vessel.  COMPLICATIONS: None.  RESULTS: Successful percutaneous transluminal coronary angioplasty of the  distal  left anterior descending artery, reducing an 80% stenosis to 25% residual with  TIMI-3 flow.  PLAN: Integrilin will be continued for 20 hours. The patient needs  aggressive  blood pressure control and risk factor reduction.  In regards to her peripheral vascular disease, she will be referred for further  evaluation and possible intervention.  03/2012 Echo LVEF 60-65%, no significant abnormalities   02/19/16 Clinic EKG (performed and reviewed in clinic): sinus bradycardia, no ischemic changes   02/2018 coronary CTA/FFR FINDINGS: FFR < 0.5 distal RCA suggesting severe mid RCA stenosis.  FFR 0.83 mid LAD suggesting non-hemodynamically significant mid LAD stenosis.  FFR 0.66 distal LAD suggesting significant distal LAD stenosis (versus cumulative effect of diffuse disease in LAD).  FFR 0.67 distal LCx suggesting significant stenosis mid to distal LCx (versus cumulative effect of diffuse disease in LCx).  IMPRESSION: 1. Suspect severe mid RCA stenosis.  2. FFR significantly low in distal LAD and distal LCx. This could suggest a single hemodynamically significant stenosis versus the cumulative effect of a long area of disease.  Suggest cardiac cath.  03/2018 cath  Dist RCA lesion is 75% stenosed.  Mid RCA lesion is 99% stenosed.  Prox RCA to Mid RCA lesion is 60% stenosed.  Drug-eluting stent was successfully placed: STENT SYNERGY DES 3.5X38., 4.0 x 28 mm and 4.0 x 12 mm.  Post intervention, there is a 0% residual stenosis.  Ost 1st Mrg lesion is 50% stenosed.  1st Mrg lesion is 75% stenosed. This was a small vessel.  Mid Cx lesion is 60% stenosed. THis was a fairly small vessel.  Mid LAD lesion is 60% stenosed. THis was not significant by CT FFR.  Prox LAD to Mid LAD lesion is 25% stenosed.  2nd Diag lesion is 75% stenosed.  The left ventricular systolic function is normal.  LV end diastolic pressure is normal.  There is no aortic valve stenosis.  Diagnostic Dominance: Right    Intervention         Assessment and Plan:  1. CAD in native artery Denies any progressive anginal symptoms.   Denies any nitroglycerin use.  Continue Plavix 75 mg daily.  Nitroglycerin 0.4 mg sublingual as needed.  2. Essential hypertension Blood pressure elevated on arrival at 160/70. Patient states her blood pressures at home usually run 182-993 systolic. Continue chlorthalidone 12.5 mg p.o. twice daily on Mondays  and Fridays.  Continue hydralazine 25 mg p.o. twice daily.  Continue labetalol 200 mg p.o. twice daily.  3. Mixed hyperlipidemia Patient is intolerant to statins.  Continue Vascepa 1 g capsule p.o. twice daily.  Continue Zetia 10 mg daily.   4. Claudication. Patient complaining of pain in calves and thighs when walking and relieved at rest. Also complaining of right lateral thigh pain which is aggravated by deep pressure/palpation. Please get a lower extremity Doppler with ABIs.  Medication Adjustments/Labs and Tests Ordered: Current medicines are reviewed at length with the patient today.  Concerns regarding medicines are outlined above.   Disposition: Follow-up with Dr. Harl Bowie or APP 4 weeks  Signed, Levell July, NP 10/13/2020 2:13 PM    [facility] at [place], [place], [place] [address] Phone: [phone]; Fax: [phone]

## 2020-10-13 NOTE — Patient Instructions (Signed)
Medication Instructions:  Continue all current medications.  Labwork: none  Testing/Procedures:  Your physician has requested that you have an ankle brachial index (ABI). During this test an ultrasound and blood pressure cuff are used to evaluate the arteries that supply the arms and legs with blood. Allow thirty minutes for this exam. There are no restrictions or special instructions.  Your physician has requested that you have a lower extremity arterial duplex. During this test, ultrasound is used to evaluate arterial blood flow in the legs. Allow one hour for this exam. There are no restrictions or special instructions.  Office will contact with results via phone or letter.    Follow-Up: 4 weeks   Any Other Special Instructions Will Be Listed Below (If Applicable).  If you need a refill on your cardiac medications before your next appointment, please call your pharmacy.

## 2020-10-14 ENCOUNTER — Other Ambulatory Visit: Payer: Self-pay | Admitting: Family Medicine

## 2020-10-14 DIAGNOSIS — I739 Peripheral vascular disease, unspecified: Secondary | ICD-10-CM

## 2020-10-27 ENCOUNTER — Other Ambulatory Visit: Payer: Self-pay

## 2020-10-27 ENCOUNTER — Other Ambulatory Visit: Payer: Self-pay | Admitting: Cardiology

## 2020-10-27 MED ORDER — HYDRALAZINE HCL 25 MG PO TABS: ORAL_TABLET | ORAL | 6 refills | Status: DC

## 2020-10-27 NOTE — Telephone Encounter (Signed)
Medication refill request for Hydralazine approved and sent to [facility].

## 2020-10-30 DIAGNOSIS — Z79899 Other long term (current) drug therapy: Secondary | ICD-10-CM | POA: Diagnosis not present

## 2020-10-30 DIAGNOSIS — I10 Essential (primary) hypertension: Secondary | ICD-10-CM | POA: Diagnosis not present

## 2020-10-30 DIAGNOSIS — Z299 Encounter for prophylactic measures, unspecified: Secondary | ICD-10-CM | POA: Diagnosis not present

## 2020-10-30 DIAGNOSIS — G4700 Insomnia, unspecified: Secondary | ICD-10-CM | POA: Diagnosis not present

## 2020-10-30 DIAGNOSIS — Z87891 Personal history of nicotine dependence: Secondary | ICD-10-CM | POA: Diagnosis not present

## 2020-10-30 DIAGNOSIS — G8929 Other chronic pain: Secondary | ICD-10-CM | POA: Diagnosis not present

## 2020-10-30 DIAGNOSIS — D6869 Other thrombophilia: Secondary | ICD-10-CM | POA: Diagnosis not present

## 2020-10-30 DIAGNOSIS — M542 Cervicalgia: Secondary | ICD-10-CM | POA: Diagnosis not present

## 2020-11-03 DIAGNOSIS — I10 Essential (primary) hypertension: Secondary | ICD-10-CM | POA: Diagnosis not present

## 2020-11-04 ENCOUNTER — Other Ambulatory Visit: Payer: Self-pay | Admitting: Cardiology

## 2020-11-12 NOTE — Progress Notes (Signed)
Cardiology Office Note  Date: 11/14/2020   ID: [name] [name], DOB 12-01-1940, MRN [id number]  PCP:  Med[facility], [facility]  Cardiologist:  Carlyle Dolly, MD Electrophysiologist:  None   Chief Complaint: CAD  History of Present Illness: [name] [name] is a 80 y.o. female with a history of CAD, HTN, PAD, HLD, CVA.   I last saw her May 29, 2020 History of PTCA of the distal LAD 2001.  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  Unclear details of intervention.  Echo 2013 LVEF 60 to 65%.  Dobutamine stress echo 2013 reported ST elevation in inferior leads, negative echo images for ischemia.  2019 coronary CTA suggesting severe RCA disease, abnormal FFR in the distal LAD and circumflex.  03/2018: Cardiac catheterization DES x3 to RCA.,  75% OM1 small vessel managed medically.  History of difficult to treat hypertension mostly due to medication side effects.  History of angioedema on ACE inhibitors and ARB.  Amlodipine caused leg swelling, clonidine dizziness.  Tried hydralazine in the past with dizziness.  Eventually discontinued D/T alopecia secondary to Aldactone use.  Home BPs 140s to 150s over 60s, compliant with meds.  Prior iliac stent PAD 2001 2013 ultrasound with ABI.  Right ABI 0.99, left ABI 0.99.  Patent left iliac stent.  Intolerant to statins.  Side effects on PCSK9 inhibitor with palpitations.  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  Acute right frontal.  Neuro recommended DAPT x4 weeks then Plavix only.  Completed aspirin   She was last here for 28-monthfollow-up on 10/13/2020. She was complaining of some pain in her right upper thigh lateral aspect worse when deep pressure applied. Also complained of bilateral leg pain worse when walking and relieved at rest.  She had a previous lower arterial study ordered but not done.. Otherwise she denied any anginal or exertional symptoms other than some mild dyspnea which resolves after she pauses for 20 to 30 s and resumes activity. Denied any  palpitations or arrhythmias, orthostatic symptoms, CVA or TIA-like symptoms, PND, orthopnea. Denies any DVT or PE-like symptoms or lower extremity edema.  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  She had tried gabapentin and Cymbalta and was unable to tolerate.  She is here for follow-up for recent vascular studies secondary to claudication.  She continues to complain of claudication-like symptoms when walking and relieved at rest.  She has a pre-existing left iliac stent. Recent vascular ultrasound of the aorta, IVC, iliacs 11/13/2020 demonstrated left common iliac stent struts seen but not in entirety so results were documented in native tab.  Abdominal aorta no evidence abdominal aortic aneurysm was visualized.  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  Greater than 50% in-stent restenosis of left common iliac artery stenosis.  Vascular consult was recommended.  ABIs indicated mild right lower extremity disease.  Right toe brachial index was abnormal.  Left ABI mild left lower extremity disease.  Left TBI abnormal.   Past Medical History:  Diagnosis Date   Anxiety    Arthritis    "neck" (03/15/2018)   CAD (coronary artery disease)    Status post POBA distal LAD, status post Cardiolite Myoview 2008 negative for ischemia   [place]D [place]    Depression    GERD (gastroesophageal reflux disease)    Hypercholesterolemia    Hypertension    Peripheral vascular disease ([facility]  status post iliac stent July 2001    Type II diabetes mellitus [facility])     Past Surgical History:  Procedure Laterality Date   ANTERIOR CERVICAL DECOMP/DISCECTOMY FUSION  2001   BACK SURGERY     BREAST SURGERY     biopsy left breat-benign   CATARACT EXTRACTION, BILATERAL Bilateral    CHOLECYSTECTOMY OPEN     CORONARY ANGIOPLASTY     POBA distal  LAD,   CORONARY STENT INTERVENTION N/A 03/15/2018   Procedure: CORONARY STENT INTERVENTION;  Surgeon: Jettie Booze, MD;  Location: [place] [facility];  Service: Cardiovascular;  Laterality: N/A;   DILATION AND CURETTAGE OF UTERUS  1960s   EYE SURGERY Bilateral    "lasers"   LEFT HEART CATH AND CORONARY ANGIOGRAPHY N/A 03/15/2018   Procedure: LEFT HEART CATH AND CORONARY ANGIOGRAPHY;  Surgeon: Jettie Booze, MD;  Location: [place] [facility];  Service: Cardiovascular;  Laterality: N/A;   PERIPHERAL VASCULAR INTERVENTION Right 04/2000    iliac stent    TUBAL LIGATION      Current Outpatient Medications  Medication Sig Dispense Refill   amLODipine (NORVASC) 5 MG tablet TAKE 1 TABLET BY MOUTH ONCE DAILY. 30 tablet 6   bimatoprost (LUMIGAN) 0.01 % SOLN Place 1 drop into both eyes at bedtime.     chlorthalidone (HYGROTON) 25 MG tablet TAKE 1/2 TABLET BY MOUTH TWICE WEEKLY ON MONDAYS AND FRIDAYS. 30 tablet 3   ezetimibe (ZETIA) 10 MG tablet TAKE 1 TABLET ONCE DAILY. 30 tablet 11   hydrALAZINE (APRESOLINE) 25 MG tablet TAKE (1) TABLET TWICE DAILY. 60 tablet 6   icosapent Ethyl (VASCEPA) 1 g capsule TAKE 1 CAPSULE BY MOUTH TWICE DAILY. 60 capsule 3   insulin glargine (LANTUS) 100 UNIT/ML injection Inject 12-47 Units into the skin See admin instructions. Inject 12 units SQ every morning and inject 47 units SQ every evening around 1600     insulin lispro (HUMALOG) 100 UNIT/ML injection Inject 16 Units into the skin 3 (three) times daily with meals.      labetalol (NORMODYNE) 200 MG tablet TAKE (1) TABLET TWICE DAILY. 60 tablet 6   magnesium oxide (MAG-OX) 400 MG tablet Take 400 mg by mouth daily.      nitroGLYCERIN (NITROSTAT) 0.4 MG SL tablet Place 1 tablet (0.4 mg total) under the tongue every 5 (five) minutes as needed for chest pain. 25 tablet 12   oxyCODONE-acetaminophen (PERCOCET) 10-325 MG tablet Take 1 tablet by mouth 2 (two) times daily.     Semaglutide  (OZEMPIC, 0.25 OR 0.5 MG/DOSE, [place]) Inject into the skin once a week.     clopidogrel (PLAVIX) 75 MG tablet Take 1 tablet (75 mg total) by mouth daily. 30 tablet 0   No current facility-administered medications for this visit.   Allergies:  Alendronate, Clonidine derivatives, Crestor [rosuvastatin], Lisinopril, Losartan, Codeine, and Hydrocodone   Social History: The patient  reports that she quit smoking about 25 years ago. Her smoking use included cigarettes. She started smoking about 58 years ago. She has a 20.00 pack-year smoking history. She has never used smokeless tobacco. She reports that she does not drink alcohol and does not use drugs.   Family History: The patient's family history includes COPD in her brother; Diabetes in her mother; Heart failure in her father and mother.   ROS:  Please see the history of present illness. Otherwise, complete review of systems is positive for none.  All other systems are reviewed and negative.   Physical  Exam: VS:  BP 132/70    Pulse 67    Ht '5\' 5"'  (1.651 m)    Wt 175 lb 12.8 oz (79.7 kg)    SpO2 95%    BMI 29.25 kg/m , BMI Body mass index is 29.25 kg/m.  Wt Readings from Last 3 Encounters:  11/14/20 175 lb 12.8 oz (79.7 kg)  10/13/20 175 lb 6.4 oz (79.6 kg)  05/29/20 171 lb 3.2 oz (77.7 kg)    General: Patient appears comfortable at rest. Neck: Supple, no elevated JVP or carotid bruits, no thyromegaly. Lungs: Clear to auscultation, nonlabored breathing at rest. Cardiac: Regular rate and rhythm, no S3 or significant systolic murmur, no pericardial rub. Extremities: No pitting edema, distal pulses 2+. Skin: Warm and dry. Musculoskeletal: No kyphosis. Neuropsychiatric: Alert and oriented x3, affect grossly appropriate.  ECG:  EKG 01/28/2020 shows sinus rhythm with a rate of 61, borderline T wave abnormalities.  Recent Labwork: 01/28/2020: ALT 20; AST 19 01/29/2020: BUN 14; Creatinine, Ser 0.71; Hemoglobin 12.7; Magnesium 1.9; Platelets 271;  Potassium 3.6; Sodium 134     Component Value Date/Time   CHOL 235 (H) 01/29/2020 0603   TRIG 225 (H) 01/29/2020 0603   HDL 37 (L) 01/29/2020 0603   CHOLHDL 6.4 01/29/2020 0603   VLDL 45 (H) 01/29/2020 0603   LDLCALC 153 (H) 01/29/2020 0603    Other Studies Reviewed Today:  Vascular ultrasound aorta, IVC, iliacs 11/13/2020  Abdominal Aorta Findings:  +-------------+-------+----------+----------+--------+--------+--------+   Location    AP (cm) Trans (cm) PSV (cm/s) Waveform Thrombus Comments   +-------------+-------+----------+----------+--------+--------+--------+   Proximal    1.70   1.60    57                      +-------------+-------+----------+----------+--------+--------+--------+   Distal                57                      +-------------+-------+----------+----------+--------+--------+--------+   RT CIA Prox  0.7   0.9     275     biphasic             +-------------+-------+----------+----------+--------+--------+--------+   RT CIA Mid              184     biphasic             +-------------+-------+----------+----------+--------+--------+--------+   RT CIA Distal            222     biphasic             +-------------+-------+----------+----------+--------+--------+--------+   RT EIA Prox             187     biphasic             +-------------+-------+----------+----------+--------+--------+--------+   RT EIA Mid              103     biphasic             +-------------+-------+----------+----------+--------+--------+--------+   RT EIA Distal            150     biphasic             +-------------+-------+----------+----------+--------+--------+--------+   LT CIA Prox  0.6   0.9     474     biphasic             +-------------+-------+----------+----------+--------+--------+--------+   LT  CIA Mid  238     biphasic             +-------------+-------+----------+----------+--------+--------+--------+   LT CIA Distal            153     biphasic             +-------------+-------+----------+----------+--------+--------+--------+   LT EIA Prox             506     stenotic             +-------------+-------+----------+----------+--------+--------+--------+   LT EIA Mid              206     biphasic             +-------------+-------+----------+----------+--------+--------+--------+   LT EIA Distal            103     biphasic             +-------------+-------+----------+----------+--------+--------+--------+   Left common iliac artery stent struts seen, but not in entirety, so  results are documented in native tab.   IVC/Iliac Findings:  +--------+------+--------+--------+    IVC   Patent Thrombus Comments   +--------+------+--------+--------+   IVC Prox patent             +--------+------+--------+--------+    Summary:  Abdominal Aorta: No evidence of an abdominal aortic aneurysm was  visualized.  Stenosis: +-------------------+-------------+---------------+   Location       Stenosis    Stent        +-------------------+-------------+---------------+   Right Common Iliac  >50% stenosis            +-------------------+-------------+---------------+   Left Common Iliac          50-99% stenosis   +-------------------+-------------+---------------+   Left External Iliac >50% stenosis            +-------------------+-------------+---------------+   Right common and left external iliac artery native artery stenoses >50%.  >50% in-stent left common iliac artery stenosis.     *See table(s) above for measurements and observations.  Vascular consult recommended.    ABI Findings:   +---------+------------------+-----+----------+--------+   Right   Rt Pressure (mmHg) Index Waveform  Comment    +---------+------------------+-----+----------+--------+   Brachial  197                          +---------+------------------+-----+----------+--------+   ATA    166         0.84  biphasic         +---------+------------------+-----+----------+--------+   PTA    143         0.73  monophasic        +---------+------------------+-----+----------+--------+   PERO    152         0.77  biphasic         +---------+------------------+-----+----------+--------+   Great Toe 113         0.57  Normal          +---------+------------------+-----+----------+--------+   +---------+------------------+-----+--------+-------+   Left    Lt Pressure (mmHg) Index Waveform Comment   +---------+------------------+-----+--------+-------+   Brachial  195                         +---------+------------------+-----+--------+-------+   ATA    147         0.75  biphasic        +---------+------------------+-----+--------+-------+   PTA    146  0.74  biphasic        +---------+------------------+-----+--------+-------+   PERO    172         0.87  biphasic        +---------+------------------+-----+--------+-------+   Great Toe 108         0.55  Normal         +---------+------------------+-----+--------+-------+   +-------+-----------+-----------+------------+------------+   ABI/TBI Today's ABI Today's TBI Previous ABI Previous TBI   +-------+-----------+-----------+------------+------------+   Right  .84     .57     1.23     .86        +-------+-----------+-----------+------------+------------+   Left   .87     .55     1.22     .81        +-------+-----------+-----------+------------+------------+       Vascular ultrasound ABI 11/13/2020 Bilateral ABIs and TBIs appear decreased compared to prior study on  07/26/18.    Summary:  Right: Resting right ankle-brachial index indicates mild right lower  extremity arterial disease. The right toe-brachial index is abnormal.   Left: Resting left ankle-brachial index indicates mild left lower  extremity arterial disease. The left toe-brachial index is abnormal.   Abdominal Aorta Findings:  +-------------+-------+----------+----------+--------+--------+--------+   Location    AP (cm) Trans (cm) PSV (cm/s) Waveform Thrombus Comments   +-------------+-------+----------+----------+--------+--------+--------+   Proximal    1.70   1.60    57                      +-------------+-------+----------+----------+--------+--------+--------+   Distal                57                      +-------------+-------+----------+----------+--------+--------+--------+   RT CIA Prox  0.7   0.9     275     biphasic             +-------------+-------+----------+----------+--------+--------+--------+   RT CIA Mid              184     biphasic             +-------------+-------+----------+----------+--------+--------+--------+   RT CIA Distal            222     biphasic             +-------------+-------+----------+----------+--------+--------+--------+   RT EIA Prox             187     biphasic             +-------------+-------+----------+----------+--------+--------+--------+   RT EIA Mid              103     biphasic             +-------------+-------+----------+----------+--------+--------+--------+   RT EIA Distal            150     biphasic             +-------------+-------+----------+----------+--------+--------+--------+   LT CIA Prox  0.6   0.9     474     biphasic              +-------------+-------+----------+----------+--------+--------+--------+   LT CIA Mid              238     biphasic             +-------------+-------+----------+----------+--------+--------+--------+   LT CIA Distal  153     biphasic             +-------------+-------+----------+----------+--------+--------+--------+   LT EIA Prox             506     stenotic             +-------------+-------+----------+----------+--------+--------+--------+   LT EIA Mid              206     biphasic             +-------------+-------+----------+----------+--------+--------+--------+   LT EIA Distal            103     biphasic             +-------------+-------+----------+----------+--------+--------+--------+   Left common iliac artery stent struts seen, but not in entirety, so  results are documented in native tab.   IVC/Iliac Findings:  +--------+------+--------+--------+    IVC   Patent Thrombus Comments   +--------+------+--------+--------+   IVC Prox patent             +--------+------+--------+--------+       Summary:  Abdominal Aorta: No evidence of an abdominal aortic aneurysm was  visualized.  Stenosis: +-------------------+-------------+---------------+   Location       Stenosis    Stent        +-------------------+-------------+---------------+   Right Common Iliac  >50% stenosis            +-------------------+-------------+---------------+   Left Common Iliac          50-99% stenosis   +-------------------+-------------+---------------+   Left External Iliac >50% stenosis            +-------------------+-------------+---------------+   Right common and left external iliac artery native artery stenoses >50%.  >50% in-stent left common iliac artery stenosis.      Echocardiogram 01/29/2020 1. Left ventricular ejection  fraction, by estimation, is 65 to 70%. The left ventricle has normal function. The left ventricle has no regional wall motion abnormalities. There is mild left ventricular hypertrophy. Left ventricular diastolic parameters are indeterminate. 2. Right ventricular systolic function is normal. The right ventricular size is normal. Tricuspid regurgitation signal is inadequate for assessing PA pressure. 3. The mitral valve is grossly normal. Trivial mitral valve regurgitation. 4. The aortic valve is tricuspid. Aortic valve regurgitation is not visualized. Mild aortic valve sclerosis is present, with no evidence of aortic valve stenosis. 5. The inferior vena cava is normal in size with greater than 50% respiratory variability, suggesting right atrial pressure of 3 mmHg. 6. Agitated saline contrast bubble study was negative, with no evidence of any interatrial shunt.   Diagnostic Studies 11/1999 Cath HEMODYNAMIC DATA: Aortic pressure was initially extremely high at 215/92. She  was  given intravenous enalaprilat, intravenous labetalol, and intravenous  nitroglycerin, ultimately bringing her systolic blood pressure down to less  than  160. Left ventricular pressure was 180/17, with a corresponding aortic  pressure of  180/80. There was no aortic valve gradient.  LEFT VENTRICULOGRAM: Wall motion was normal. Ejection fraction was estimated  t  65%. No mitral regurgitation.  ABDOMINAL AORTOGRAM: This revealed patent renal arteries and abdominal aorta.  The  left common iliac artery had a 75% stenosis. The right external iliac artery  had a  40% stenosis.  CORONARY ARTERIOGRAPHY (Right dominant):  1. The left main was normal.  2. The left anterior descending artery had a 25% stenosis in the proximal to  mid  vessel. The distal LAD had a 50% stenosis  just after the bifurcation of the  large second diagonal branch. Further down the distal LAD is a focal 80%  stenosis.   3. The left circumflex had a 20% stenosis in the mid vessel. It gave rise to a  small OM-1, a normal sized OM-2 which had a 25% stenosis, and a normal sized  OM-3 which had a 20% stenosis.  4. The right coronary artery had a diffuse 40% stenosis extending from the mid  o  distal vessel. There was a large posterior descending artery which had a  25%,  followed by a 40% stenosis. There were two small posterolateral branches.  IMPRESSIONS:  1. Normal left ventricular systolic function.  2. Peripheral vascular disease, as described.  3. One-vessel coronary artery disease with significant stenosis in the distal  left  anterior descending artery. This appears to correspond with her EKG changes  and  Cardiolite scan. She has moderate, but non-flow limiting disease in the  right coronary and left circumflex.  PLAN: Percutaneous intervention of the distal LAD, see below.  PTCA PROCEDURAL NOTE: Following the completion of the diagnostic  catheterization,  we opted to proceed with percutaneous intervention. The preexisting 6-French  sheath in the right femoral artery was exchanged over a wire for a 7-French  sheath.  Integrilin and heparin were administered per protocol. We used a 7-French JL-4  guiding catheter and a BMW wire. The reference vessel was approximately 2 mm  in  diameter. We utilized a 2.0 x 20 mm CrossSail balloon, which was inflated  initially to 12 atm x 2 minutes and then 14 atm x 3 minutes. Final  angiographic  images revealed significant improvement in the lumen with 25% residual stenosis  and  a possible very small non-flow limiting dissection. There was TIMI-3 flow into  the  distal vessel.  COMPLICATIONS: None.  RESULTS: Successful percutaneous transluminal coronary angioplasty of the  distal  left anterior descending artery, reducing an 80% stenosis to 25% residual with  TIMI-3 flow.  PLAN: Integrilin will be continued for  20 hours. The patient needs aggressive  blood pressure control and risk factor reduction.  In regards to her peripheral vascular disease, she will be referred for further  evaluation and possible intervention.  03/2012 Echo LVEF 60-65%, no significant abnormalities   02/19/16 Clinic EKG (performed and reviewed in clinic): sinus bradycardia, no ischemic changes   02/2018 coronary CTA/FFR FINDINGS: FFR < 0.5 distal RCA suggesting severe mid RCA stenosis.  FFR 0.83 mid LAD suggesting non-hemodynamically significant mid LAD stenosis.  FFR 0.66 distal LAD suggesting significant distal LAD stenosis (versus cumulative effect of diffuse disease in LAD).  FFR 0.67 distal LCx suggesting significant stenosis mid to distal LCx (versus cumulative effect of diffuse disease in LCx).  IMPRESSION: 1. Suspect severe mid RCA stenosis.  2. FFR significantly low in distal LAD and distal LCx. This could suggest a single hemodynamically significant stenosis versus the cumulative effect of a long area of disease.  Suggest cardiac cath.  03/2018 cath  Dist RCA lesion is 75% stenosed.  Mid RCA lesion is 99% stenosed.  Prox RCA to Mid RCA lesion is 60% stenosed.  Drug-eluting stent was successfully placed: STENT SYNERGY DES 3.5X38., 4.0 x 28 mm and 4.0 x 12 mm.  Post intervention, there is a 0% residual stenosis.  Ost 1st Mrg lesion is 50% stenosed.  1st Mrg lesion is 75% stenosed. This was a small vessel.  Mid Cx lesion is 60% stenosed. THis was a fairly small  vessel.  Mid LAD lesion is 60% stenosed. THis was not significant by CT FFR.  Prox LAD to Mid LAD lesion is 25% stenosed.  2nd Diag lesion is 75% stenosed.  The left ventricular systolic function is normal.  LV end diastolic pressure is normal.  There is no aortic valve stenosis.  Diagnostic Dominance: Right    Intervention         Assessment and Plan:  1. CAD in native artery Denies any  progressive anginal symptoms.  Denies any nitroglycerin use.  Continue Plavix 75 mg daily (she is not taking Plavix for cardiac indication.) She states neurologist placed this when she had a stroke and was told to stop her aspirin and take Plavix only. She states she has run out of Plavix. I advised her we would refill it. Continue nitroglycerin 0.4 mg sublingual as needed.   2. Essential hypertension Blood pressure 132/78 today. Continue chlorthalidone 12.5 mg p.o. twice daily on Mondays and Fridays.  Continue hydralazine 25 mg p.o. twice daily.  Continue labetalol 200 mg p.o. twice daily.  3. Mixed hyperlipidemia Patient is intolerant to statins.  Continue Vascepa 1 g capsule p.o. twice daily.  Continue Zetia 10 mg daily.  4. Claudication.  Peripheral vascular disease She complained of pain in calves and thighs when walking and relieved at rest.  Also complained of right lateral thigh pain aggravated by deep pressure/palpation.  Recent abnormal vascular studies.  See reports above.  Referral to vascular was recommended. Please refer to vascular for evaluation.  Medication Adjustments/Labs and Tests Ordered: Current medicines are reviewed at length with the patient today.  Concerns regarding medicines are outlined above.   Disposition: Follow-up with Dr. Harl Bowie or APP 6 months  Signed, Levell July, NP 11/14/2020 2:16 PM    [facility] [place], [place], [place] [address] Phone: [phone]; Fax: [phone]

## 2020-11-13 ENCOUNTER — Ambulatory Visit (INDEPENDENT_AMBULATORY_CARE_PROVIDER_SITE_OTHER): Payer: Medicare Other

## 2020-11-13 ENCOUNTER — Other Ambulatory Visit: Payer: Self-pay | Admitting: Family Medicine

## 2020-11-13 DIAGNOSIS — M79606 Pain in leg, unspecified: Secondary | ICD-10-CM

## 2020-11-13 DIAGNOSIS — I739 Peripheral vascular disease, unspecified: Secondary | ICD-10-CM

## 2020-11-13 DIAGNOSIS — M79651 Pain in right thigh: Secondary | ICD-10-CM

## 2020-11-14 ENCOUNTER — Ambulatory Visit (INDEPENDENT_AMBULATORY_CARE_PROVIDER_SITE_OTHER): Payer: Medicare Other | Admitting: Family Medicine

## 2020-11-14 ENCOUNTER — Encounter: Payer: Self-pay | Admitting: Family Medicine

## 2020-11-14 VITALS — BP 132/70 | HR 67 | Ht 65.0 in | Wt 175.8 lb

## 2020-11-14 DIAGNOSIS — I739 Peripheral vascular disease, unspecified: Secondary | ICD-10-CM | POA: Diagnosis not present

## 2020-11-14 MED ORDER — CLOPIDOGREL BISULFATE 75 MG PO TABS: 75.0000 mg | ORAL_TABLET | Freq: Every day | ORAL | 0 refills | Status: DC

## 2020-11-14 NOTE — Patient Instructions (Addendum)
Medication Instructions:  Continue all current medications.  Labwork: none  Testing/Procedures: None   Follow-Up: 6 months   Any Other Special Instructions Will Be Listed Below (If Applicable). You have been referred to:  Vascular referral   If you need a refill on your cardiac medications before your next appointment, please call your pharmacy.

## 2020-11-24 ENCOUNTER — Other Ambulatory Visit: Payer: Self-pay

## 2020-11-24 ENCOUNTER — Encounter: Payer: Self-pay | Admitting: Vascular Surgery

## 2020-11-24 ENCOUNTER — Ambulatory Visit (INDEPENDENT_AMBULATORY_CARE_PROVIDER_SITE_OTHER): Payer: Medicare Other | Admitting: Vascular Surgery

## 2020-11-24 VITALS — BP 139/77 | HR 66 | Temp 98.1°F | Resp 16 | Ht 65.0 in | Wt 173.0 lb

## 2020-11-24 DIAGNOSIS — I6930 Unspecified sequelae of cerebral infarction: Secondary | ICD-10-CM | POA: Diagnosis not present

## 2020-11-24 DIAGNOSIS — E118 Type 2 diabetes mellitus with unspecified complications: Secondary | ICD-10-CM | POA: Diagnosis not present

## 2020-11-24 DIAGNOSIS — I70213 Atherosclerosis of native arteries of extremities with intermittent claudication, bilateral legs: Secondary | ICD-10-CM

## 2020-11-24 DIAGNOSIS — M5459 Other low back pain: Secondary | ICD-10-CM | POA: Diagnosis not present

## 2020-11-24 DIAGNOSIS — M25552 Pain in left hip: Secondary | ICD-10-CM | POA: Diagnosis not present

## 2020-11-24 DIAGNOSIS — I69393 Ataxia following cerebral infarction: Secondary | ICD-10-CM | POA: Diagnosis not present

## 2020-11-24 DIAGNOSIS — R269 Unspecified abnormalities of gait and mobility: Secondary | ICD-10-CM | POA: Diagnosis not present

## 2020-11-24 DIAGNOSIS — E785 Hyperlipidemia, unspecified: Secondary | ICD-10-CM | POA: Diagnosis not present

## 2020-11-24 NOTE — Progress Notes (Signed)
[facility] [place]  Patient name: [name] [name] MRN: [id number] DOB: 19-Mar-1941 Sex: female  REASON FOR CONSULT: Evaluation intermittent claudication  HPI: [name] [name] is a 80 y.o. female, who is here today for discussion of bilateral lower extremity intermittent claudication.  She reports that she has episodes where both legs "lock up".  She has to rest for only approximately 10 seconds and is able to walk again.  She reports that this does not affect when she is shopping.  She reports she walks around [place] with no difficulty.  She does report that if she walks around her apartment complex she notices the sensation.  She has no history of lower extremity rest pain and no history of tissue loss.  She does have a remote history of iliac stenting from July 2001.  She feels that this is in her right iliac artery.  Most of the old documentation suggest that this was in the left.  I do not have the actual records of that event.  She does have history of coronary artery disease and also has a history of stroke affecting her left arm in 2021.  Past Medical History:  Diagnosis Date  . Anxiety   . Arthritis    "neck" (03/15/2018)  . CAD (coronary artery disease)    Status post POBA distal LAD, status post Cardiolite Myoview 2008 negative for ischemia  . COPD (chronic obstructive pulmonary disease) ([place])   . Depression   . GERD (gastroesophageal reflux disease)   . Hypercholesterolemia   . Hypertension   . Peripheral vascular disease ([place])     status post iliac stent July 2001   . Type II diabetes mellitus (HCC)     Family History  Problem Relation Age of Onset  . Heart failure Father   . Heart failure Mother   . Diabetes Mother   . COPD Brother     SOCIAL HISTORY: Social History   Socioeconomic History  . Marital status: Widowed    Spouse name: Not on file  . Number of children: Not on file  . Years of education: Not  on file  . Highest education level: Not on file  Occupational History  . Not on file  Tobacco Use  . Smoking status: Former Smoker    Packs/day: 0.50    Years: 40.00    Pack years: 20.00    Types: Cigarettes    Start date: 11/30/1961    Quit date: 10/05/1995    Years since quitting: 25.1  . Smokeless tobacco: Never Used  Vaping Use  . Vaping Use: Never used  Substance and Sexual Activity  . Alcohol use: Never    Alcohol/week: 0.0 standard drinks  . Drug use: Never  . Sexual activity: Not Currently  Other Topics Concern  . Not on file  Social History Narrative  . Not on file   Social Determinants of Health   Financial Resource Strain: Not on file  Food Insecurity: Not on file  Transportation Needs: Not on file  Physical Activity: Not on file  Stress: Not on file  Social Connections: Not on file  Intimate Partner Violence: Not on file    Allergies  Allergen Reactions  . Alendronate     Other reaction(s): Other (See Comments) Leg edema  . Clonidine Derivatives Other (See Comments)    dizziness  . Crestor [Rosuvastatin]     Muscle cramps  . Lisinopril Other (See Comments)    Tongue swelling  .  Losartan Other (See Comments)    TONGUE SWELLING  . Codeine Nausea And Vomiting  . Hydrocodone Nausea And Vomiting    Current Outpatient Medications  Medication Sig Dispense Refill  . amLODipine (NORVASC) 5 MG tablet TAKE 1 TABLET BY MOUTH ONCE DAILY. 30 tablet 6  . bimatoprost (LUMIGAN) 0.01 % SOLN Place 1 drop into both eyes at bedtime.    . chlorthalidone (HYGROTON) 25 MG tablet TAKE 1/2 TABLET BY MOUTH TWICE WEEKLY ON MONDAYS AND FRIDAYS. 30 tablet 3  . clopidogrel (PLAVIX) 75 MG tablet Take 1 tablet (75 mg total) by mouth daily. 30 tablet 0  . ezetimibe (ZETIA) 10 MG tablet TAKE 1 TABLET ONCE DAILY. 30 tablet 11  . hydrALAZINE (APRESOLINE) 25 MG tablet TAKE (1) TABLET TWICE DAILY. 60 tablet 6  . icosapent Ethyl (VASCEPA) 1 g capsule TAKE 1 CAPSULE BY MOUTH TWICE  DAILY. 60 capsule 3  . insulin glargine (LANTUS) 100 UNIT/ML injection Inject 12-47 Units into the skin See admin instructions. Inject 12 units SQ every morning and inject 47 units SQ every evening around 1600    . insulin lispro (HUMALOG) 100 UNIT/ML injection Inject 16 Units into the skin 3 (three) times daily with meals.     [name] labetalol (NORMODYNE) 200 MG tablet TAKE (1) TABLET TWICE DAILY. 60 tablet 6  . magnesium oxide (MAG-OX) 400 MG tablet Take 400 mg by mouth daily.     . nitroGLYCERIN (NITROSTAT) 0.4 MG SL tablet Place 1 tablet (0.4 mg total) under the tongue every 5 (five) minutes as needed for chest pain. 25 tablet 12  . Semaglutide (OZEMPIC, 0.25 OR 0.5 MG/DOSE, [place]) Inject into the skin once a week.    [name] oxyCODONE-acetaminophen (PERCOCET) 10-325 MG tablet Take 1 tablet by mouth 2 (two) times daily. (Patient not taking: Reported on 11/24/2020)     No current facility-administered medications for this visit.    REVIEW OF SYSTEMS:  [X]  denotes positive finding, [ ]  denotes negative finding Cardiac  Comments:  Chest pain or chest pressure:    Shortness of breath upon exertion: x   Short of breath when lying flat:    Irregular heart rhythm:        Vascular    Pain in calf, thigh, or hip brought on by ambulation:    Pain in feet at night that wakes you up from your sleep:     Blood clot in your veins:    Leg swelling:         Pulmonary    Oxygen at home:    Productive cough:     Wheezing:         Neurologic    Sudden weakness in arms or legs:  xx   Sudden numbness in arms or legs:     Sudden onset of difficulty speaking or slurred speech:    Temporary loss of vision in one eye:     Problems with dizziness:         Gastrointestinal    Blood in stool:     Vomited blood:         Genitourinary    Burning when urinating:     Blood in urine:        Psychiatric    Major depression:         Hematologic    Bleeding problems:    Problems with blood clotting too easily:         Skin    Rashes or ulcers:  Constitutional    Fever or chills:      PHYSICAL EXAM: Vitals:   11/24/20 0902  BP: 139/77  Pulse: 66  Resp: 16  Temp: 98.1 F (36.7 C)  TempSrc: Temporal  SpO2: 97%  Weight: 173 lb (78.5 kg)  Height: 5\' 5"  (1.651 m)    GENERAL: The patient is a well-nourished female, in no acute distress. The vital signs are documented above. CARDIOVASCULAR: Carotid arteries without bruits bilaterally.  2+ radial pulses bilaterally.  2+ femoral pulses bilaterally.  1+ right dorsalis pedis pulse and I do not feel a dorsalis pedis pulse on the left PULMONARY: There is good air exchange  MUSCULOSKELETAL: There are no major deformities or cyanosis. NEUROLOGIC: No focal weakness or paresthesias are detected. SKIN: There are no ulcers or rashes noted. PSYCHIATRIC: The patient has a normal affect.  DATA:  Noninvasive studies from 11/13/2020 were reviewed.  This reveals ankle arm index of 0.84 on the right and 0.87 on the left.  There is evidence of iliac disease bilaterally and also in-stent restenosis greater than 50%  MEDICAL ISSUES: Mild to moderate bilateral lower extremity arterial disease most likely related to iliac segments.  The patient reports that this is not limiting to her.  I did explain that this does not put her at any risk for tissue loss.  I did explain that if the symptoms became limiting to her, the neck step would be arteriography for further evaluation and possible intervention.  She reports that she is comfortable with her current level of claudication.  She will continue her walking.  She is not a smoker.  She will see [place] again on an as-needed basis   Rosetta Posner, MD [facility] Tel [phone] Pager [phone]  Note: Portions of this report may have been transcribed using voice recognition software.  Every effort has been made to ensure accuracy; however, inadvertent computerized  transcription errors may still be present.

## 2020-11-28 DIAGNOSIS — M542 Cervicalgia: Secondary | ICD-10-CM | POA: Diagnosis not present

## 2020-11-28 DIAGNOSIS — E1165 Type 2 diabetes mellitus with hyperglycemia: Secondary | ICD-10-CM | POA: Diagnosis not present

## 2020-11-28 DIAGNOSIS — Z299 Encounter for prophylactic measures, unspecified: Secondary | ICD-10-CM | POA: Diagnosis not present

## 2020-11-28 DIAGNOSIS — G8929 Other chronic pain: Secondary | ICD-10-CM | POA: Diagnosis not present

## 2020-11-28 DIAGNOSIS — I10 Essential (primary) hypertension: Secondary | ICD-10-CM | POA: Diagnosis not present

## 2020-11-28 DIAGNOSIS — Z79899 Other long term (current) drug therapy: Secondary | ICD-10-CM | POA: Diagnosis not present

## 2020-12-01 DIAGNOSIS — I10 Essential (primary) hypertension: Secondary | ICD-10-CM | POA: Diagnosis not present

## 2020-12-08 ENCOUNTER — Ambulatory Visit: Payer: Medicare Other | Admitting: Cardiology

## 2020-12-09 DIAGNOSIS — I779 Disorder of arteries and arterioles, unspecified: Secondary | ICD-10-CM | POA: Diagnosis not present

## 2020-12-09 DIAGNOSIS — I4891 Unspecified atrial fibrillation: Secondary | ICD-10-CM | POA: Diagnosis not present

## 2020-12-09 DIAGNOSIS — I10 Essential (primary) hypertension: Secondary | ICD-10-CM | POA: Diagnosis not present

## 2020-12-09 DIAGNOSIS — Z299 Encounter for prophylactic measures, unspecified: Secondary | ICD-10-CM | POA: Diagnosis not present

## 2020-12-09 DIAGNOSIS — J309 Allergic rhinitis, unspecified: Secondary | ICD-10-CM | POA: Diagnosis not present

## 2020-12-15 ENCOUNTER — Other Ambulatory Visit: Payer: Self-pay | Admitting: Cardiology

## 2020-12-22 ENCOUNTER — Other Ambulatory Visit: Payer: Self-pay | Admitting: Family Medicine

## 2020-12-23 DIAGNOSIS — H401134 Primary open-angle glaucoma, bilateral, indeterminate stage: Secondary | ICD-10-CM | POA: Diagnosis not present

## 2020-12-23 DIAGNOSIS — H353221 Exudative age-related macular degeneration, left eye, with active choroidal neovascularization: Secondary | ICD-10-CM | POA: Diagnosis not present

## 2020-12-23 DIAGNOSIS — Z794 Long term (current) use of insulin: Secondary | ICD-10-CM | POA: Diagnosis not present

## 2020-12-23 DIAGNOSIS — Z961 Presence of intraocular lens: Secondary | ICD-10-CM | POA: Diagnosis not present

## 2020-12-23 DIAGNOSIS — E113293 Type 2 diabetes mellitus with mild nonproliferative diabetic retinopathy without macular edema, bilateral: Secondary | ICD-10-CM | POA: Diagnosis not present

## 2020-12-23 DIAGNOSIS — H353110 Nonexudative age-related macular degeneration, right eye, stage unspecified: Secondary | ICD-10-CM | POA: Diagnosis not present

## 2020-12-26 DIAGNOSIS — M542 Cervicalgia: Secondary | ICD-10-CM | POA: Diagnosis not present

## 2020-12-26 DIAGNOSIS — Z79899 Other long term (current) drug therapy: Secondary | ICD-10-CM | POA: Diagnosis not present

## 2020-12-26 DIAGNOSIS — I10 Essential (primary) hypertension: Secondary | ICD-10-CM | POA: Diagnosis not present

## 2020-12-26 DIAGNOSIS — R21 Rash and other nonspecific skin eruption: Secondary | ICD-10-CM | POA: Diagnosis not present

## 2020-12-26 DIAGNOSIS — G8929 Other chronic pain: Secondary | ICD-10-CM | POA: Diagnosis not present

## 2020-12-26 DIAGNOSIS — Z299 Encounter for prophylactic measures, unspecified: Secondary | ICD-10-CM | POA: Diagnosis not present

## 2021-01-01 DIAGNOSIS — I10 Essential (primary) hypertension: Secondary | ICD-10-CM | POA: Diagnosis not present

## 2021-01-05 DIAGNOSIS — I10 Essential (primary) hypertension: Secondary | ICD-10-CM | POA: Diagnosis not present

## 2021-01-05 DIAGNOSIS — E1165 Type 2 diabetes mellitus with hyperglycemia: Secondary | ICD-10-CM | POA: Diagnosis not present

## 2021-01-05 DIAGNOSIS — Z794 Long term (current) use of insulin: Secondary | ICD-10-CM | POA: Diagnosis not present

## 2021-01-05 DIAGNOSIS — E1142 Type 2 diabetes mellitus with diabetic polyneuropathy: Secondary | ICD-10-CM | POA: Diagnosis not present

## 2021-01-05 DIAGNOSIS — Z299 Encounter for prophylactic measures, unspecified: Secondary | ICD-10-CM | POA: Diagnosis not present

## 2021-01-05 DIAGNOSIS — I779 Disorder of arteries and arterioles, unspecified: Secondary | ICD-10-CM | POA: Diagnosis not present

## 2021-01-26 DIAGNOSIS — E1165 Type 2 diabetes mellitus with hyperglycemia: Secondary | ICD-10-CM | POA: Diagnosis not present

## 2021-01-26 DIAGNOSIS — Z79899 Other long term (current) drug therapy: Secondary | ICD-10-CM | POA: Diagnosis not present

## 2021-01-26 DIAGNOSIS — I10 Essential (primary) hypertension: Secondary | ICD-10-CM | POA: Diagnosis not present

## 2021-01-26 DIAGNOSIS — M542 Cervicalgia: Secondary | ICD-10-CM | POA: Diagnosis not present

## 2021-01-26 DIAGNOSIS — Z299 Encounter for prophylactic measures, unspecified: Secondary | ICD-10-CM | POA: Diagnosis not present

## 2021-01-26 DIAGNOSIS — G8929 Other chronic pain: Secondary | ICD-10-CM | POA: Diagnosis not present

## 2021-01-30 DIAGNOSIS — I10 Essential (primary) hypertension: Secondary | ICD-10-CM | POA: Diagnosis not present

## 2021-02-23 ENCOUNTER — Other Ambulatory Visit: Payer: Self-pay | Admitting: Cardiology

## 2021-02-25 DIAGNOSIS — Z299 Encounter for prophylactic measures, unspecified: Secondary | ICD-10-CM | POA: Diagnosis not present

## 2021-02-25 DIAGNOSIS — Z79899 Other long term (current) drug therapy: Secondary | ICD-10-CM | POA: Diagnosis not present

## 2021-02-25 DIAGNOSIS — G8929 Other chronic pain: Secondary | ICD-10-CM | POA: Diagnosis not present

## 2021-02-25 DIAGNOSIS — I10 Essential (primary) hypertension: Secondary | ICD-10-CM | POA: Diagnosis not present

## 2021-02-25 DIAGNOSIS — M542 Cervicalgia: Secondary | ICD-10-CM | POA: Diagnosis not present

## 2021-03-03 ENCOUNTER — Other Ambulatory Visit: Payer: Self-pay | Admitting: Cardiology

## 2021-03-03 DIAGNOSIS — I10 Essential (primary) hypertension: Secondary | ICD-10-CM | POA: Diagnosis not present

## 2021-03-24 ENCOUNTER — Other Ambulatory Visit: Payer: Self-pay | Admitting: Cardiology

## 2021-03-25 DIAGNOSIS — I10 Essential (primary) hypertension: Secondary | ICD-10-CM | POA: Diagnosis not present

## 2021-03-25 DIAGNOSIS — Z299 Encounter for prophylactic measures, unspecified: Secondary | ICD-10-CM | POA: Diagnosis not present

## 2021-03-25 DIAGNOSIS — Z0000 Encounter for general adult medical examination without abnormal findings: Secondary | ICD-10-CM | POA: Diagnosis not present

## 2021-03-25 DIAGNOSIS — Z7189 Other specified counseling: Secondary | ICD-10-CM | POA: Diagnosis not present

## 2021-03-25 DIAGNOSIS — E7800 Pure hypercholesterolemia, unspecified: Secondary | ICD-10-CM | POA: Diagnosis not present

## 2021-03-25 DIAGNOSIS — E1165 Type 2 diabetes mellitus with hyperglycemia: Secondary | ICD-10-CM | POA: Diagnosis not present

## 2021-03-25 DIAGNOSIS — Z87891 Personal history of nicotine dependence: Secondary | ICD-10-CM | POA: Diagnosis not present

## 2021-03-25 DIAGNOSIS — Z79899 Other long term (current) drug therapy: Secondary | ICD-10-CM | POA: Diagnosis not present

## 2021-03-25 DIAGNOSIS — N390 Urinary tract infection, site not specified: Secondary | ICD-10-CM | POA: Diagnosis not present

## 2021-03-25 DIAGNOSIS — R5383 Other fatigue: Secondary | ICD-10-CM | POA: Diagnosis not present

## 2021-03-31 ENCOUNTER — Other Ambulatory Visit: Payer: Self-pay | Admitting: Cardiology

## 2021-04-02 DIAGNOSIS — I10 Essential (primary) hypertension: Secondary | ICD-10-CM | POA: Diagnosis not present

## 2021-04-24 DIAGNOSIS — M542 Cervicalgia: Secondary | ICD-10-CM | POA: Diagnosis not present

## 2021-04-24 DIAGNOSIS — E1165 Type 2 diabetes mellitus with hyperglycemia: Secondary | ICD-10-CM | POA: Diagnosis not present

## 2021-04-24 DIAGNOSIS — G8929 Other chronic pain: Secondary | ICD-10-CM | POA: Diagnosis not present

## 2021-04-24 DIAGNOSIS — Z299 Encounter for prophylactic measures, unspecified: Secondary | ICD-10-CM | POA: Diagnosis not present

## 2021-04-24 DIAGNOSIS — Z79899 Other long term (current) drug therapy: Secondary | ICD-10-CM | POA: Diagnosis not present

## 2021-04-24 DIAGNOSIS — I10 Essential (primary) hypertension: Secondary | ICD-10-CM | POA: Diagnosis not present

## 2021-05-01 DIAGNOSIS — I10 Essential (primary) hypertension: Secondary | ICD-10-CM | POA: Diagnosis not present

## 2021-05-03 DIAGNOSIS — I10 Essential (primary) hypertension: Secondary | ICD-10-CM | POA: Diagnosis not present

## 2021-05-04 ENCOUNTER — Other Ambulatory Visit: Payer: Self-pay | Admitting: Cardiology

## 2021-05-15 ENCOUNTER — Other Ambulatory Visit: Payer: Self-pay

## 2021-05-15 ENCOUNTER — Telehealth: Payer: Self-pay | Admitting: Cardiology

## 2021-05-15 ENCOUNTER — Ambulatory Visit (INDEPENDENT_AMBULATORY_CARE_PROVIDER_SITE_OTHER): Payer: Medicare Other | Admitting: Cardiology

## 2021-05-15 ENCOUNTER — Encounter: Payer: Self-pay | Admitting: Cardiology

## 2021-05-15 ENCOUNTER — Encounter: Payer: Self-pay | Admitting: *Deleted

## 2021-05-15 VITALS — BP 140/58 | HR 61 | Ht 65.0 in | Wt 170.0 lb

## 2021-05-15 DIAGNOSIS — E782 Mixed hyperlipidemia: Secondary | ICD-10-CM | POA: Diagnosis not present

## 2021-05-15 DIAGNOSIS — I10 Essential (primary) hypertension: Secondary | ICD-10-CM

## 2021-05-15 DIAGNOSIS — I2510 Atherosclerotic heart disease of native coronary artery without angina pectoris: Secondary | ICD-10-CM

## 2021-05-15 MED ORDER — HYDRALAZINE HCL 25 MG PO TABS: 12.5000 mg | ORAL_TABLET | Freq: Two times a day (BID) | ORAL | 3 refills | Status: DC

## 2021-05-15 NOTE — Patient Instructions (Addendum)
Medication Instructions:  Decrease Hydralazine to 12.'5mg'$  twice a day  Continue all other medications.     Labwork: none  Testing/Procedures: none  Follow-Up: 6 months   Any Other Special Instructions Will Be Listed Below (If Applicable).  If you need a refill on your cardiac medications before your next appointment, please call your pharmacy.

## 2021-05-15 NOTE — Telephone Encounter (Signed)
Patient called wanting to know if she is supposed to continue taking Plavix.

## 2021-05-15 NOTE — Telephone Encounter (Signed)
Advised to stay on plavix Verbalized understanding

## 2021-05-15 NOTE — Progress Notes (Signed)
Clinical Summary [name] [name] is a 80 y.o.femaleseen today for follow up of the following medical problems.    1. CAD - prior PTCA to distal LAD in 2001. 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   - echo 06/2012 LVEF 60-65% - DSE 06/2012 with reported ST elevation in inferior leads, negative echo images for ischemia.     - 02/2018 coronary CTA suggests severe RCA disease, abnormal FFR in distal LAD and LCX as well.  - 03/2018 cath as reported below. Received DES x3  to RCA. 75% OM1 small vessel managed medically     - no recent chest pain - compliant with meds     2. HTN - history of difficult to treat HTN, primarily due to reported medication side effects to several agents.  - history of angioedema on ACE-I and most recently on ARB.   - she reports amlodopine caused leg swelling (she had been on 2m).  Clonidine caused dizziness.  - we had started chlrothalidone 542mdaily. She had some muscle cramping, and the dose was decreased to 2547maily, then 22m66md and then 12.5mg 72m.  - dizziness after starting hydralazine, pins and needles feelings in ears with laying down. We have tried continuing this medication. Same side effect reported on nitrates, discontinued.  - she reports alopecia possibly due to aldactone       - reports some low bp's at times at home that are symptomatic. She started taking her hydralazine 22mg 63m once a day       3. PAD - prior iliac stent in July 2001 - [place] 9/2[place] Right ABI 0.99 Left ABI 0.99, with patent left iliac stent.  11/2020 ABI:riASN:[id number] 0.87    = followed by Dr Early [facility]net[facility] - recs for ongoing monitor, f/u as needed, does not have life limiting claudication - chronic stable symptoms    4. Hyperlipidemia - intolerant to statins   - she reports side effects on pcsk9 inhibitor, caused signicant palpitations       5. CVA - admission 01/2020 with CVA, MRI showed  acute right frontal  - neuro recommended contuing DAPT x 4 weeks, then plavix only. Completed ASA.    Past Medical History:  Diagnosis Date   Anxiety    Arthritis    "neck" (03/15/2018)   CAD (coronary artery disease)    Status post POBA distal LAD, status post Cardiolite Myoview 2008 negative for ischemia   COPD (chronic obstructive pulmonary disease) (HCC)    Depression    GERD (gastroesophageal reflux disease)    Hypercholesterolemia    Hypertension    Peripheral vascular disease (HCC)  [place]    Type II diabetes mellitus (HCC)      Allergies  Allergen Reactions   Alendronate     Other reaction(s): Other (See Comments) Leg edema   Clonidine Derivatives Other (See Comments)    dizziness   Crestor [Rosuvastatin]     Muscle cramps   Lisinopril Other (See Comments)    Tongue swelling   Losartan Other (See Comments)    TONGUE SWELLING   Codeine Nausea And Vomiting   Hydrocodone Nausea And Vomiting     Current Outpatient Medications  Medication Sig Dispense Refill   amLODipine (NORVASC) 5 MG tablet TAKE 1 TABLET BY MOUTH ONCE DAILY. 90 tablet 3   bimatoprost (LUMIGAN) 0.01 % SOLN Place 1 drop into both  eyes at bedtime.     chlorthalidone (HYGROTON) 25 MG tablet TAKE 1/2 TABLET BY MOUTH TWICE WEEKLY ON MONDAYS AND FRIDAYS. 30 tablet 3   clopidogrel (PLAVIX) 75 MG tablet TAKE 1 TABLET ONCE DAILY. 90 tablet 3   ezetimibe (ZETIA) 10 MG tablet TAKE 1 TABLET ONCE DAILY. 30 tablet 11   hydrALAZINE (APRESOLINE) 25 MG tablet TAKE (1) TABLET TWICE DAILY. 60 tablet 4   icosapent Ethyl (VASCEPA) 1 g capsule TAKE 1 CAPSULE BY MOUTH TWICE DAILY. 60 capsule 6   insulin glargine (LANTUS) 100 UNIT/ML injection Inject 12-47 Units into the skin See admin instructions. Inject 12 units SQ every morning and inject 47 units SQ every evening around 1600     insulin lispro (HUMALOG) 100 UNIT/ML injection Inject 16 Units into the skin 3 (three) times daily with  meals.      labetalol (NORMODYNE) 200 MG tablet TAKE (1) TABLET TWICE DAILY. 180 tablet 3   magnesium oxide (MAG-OX) 400 MG tablet Take 400 mg by mouth daily.      nitroGLYCERIN (NITROSTAT) 0.4 MG SL tablet Place 1 tablet (0.4 mg total) under the tongue every 5 (five) minutes as needed for chest pain. 25 tablet 12   oxyCODONE-acetaminophen (PERCOCET) 10-325 MG tablet Take 1 tablet by mouth 2 (two) times daily. (Patient not taking: Reported on 11/24/2020)     Semaglutide (OZEMPIC, 0.25 OR 0.5 MG/DOSE, [place]) Inject into the skin once a week.     No current facility-administered medications for this visit.     Past Surgical History:  Procedure Laterality Date   ANTERIOR CERVICAL DECOMP/DISCECTOMY FUSION  2001   BACK SURGERY     BREAST SURGERY     biopsy left breat-benign   CATARACT EXTRACTION, BILATERAL Bilateral    CHOLECYSTECTOMY OPEN     CORONARY ANGIOPLASTY     POBA distal LAD,   CORONARY STENT INTERVENTION N/A 03/15/2018   Procedure: CORONARY STENT INTERVENTION;  Surgeon: Jettie Booze, MD;  [name];  Service: Cardiovascular;  Laterality: N/A;   DILATION AND CURETTAGE OF UTERUS  1960s   EYE SURGERY Bilateral    "lasers"   LEFT HEART CATH AND CORONARY ANGIOGRAPHY N/A 03/15/2018   Procedure: LEFT HEART CATH AND CORONARY ANGIOGRAPHY;  Surgeon: Jettie Booze, MD;  Location: [facility];  Service: Cardiovascular;  Laterality: N/A;   PERIPHERAL VASCULAR INTERVENTION Right 04/2000    iliac stent    TUBAL LIGATION       Allergies  Allergen Reactions   Alendronate     Other reaction(s): Other (See Comments) Leg edema   Clonidine Derivatives Other (See Comments)    dizziness   Crestor [Rosuvastatin]     Muscle cramps   Lisinopril Other (See Comments)    Tongue swelling   Losartan Other (See Comments)    TONGUE SWELLING   Codeine Nausea And Vomiting   Hydrocodone Nausea And Vomiting      Family History  Problem Relation Age of Onset    Heart failure Father    Heart failure Mother    Diabetes Mother    COPD Brother      Social History [name] [name] reports that she quit smoking about 25 years ago. Her smoking use included cigarettes. She started smoking about 59 years ago. She has a 20.00 pack-year smoking history. She has never used smokeless tobacco. Ms. Sykora reports no history of alcohol use.   Review of Systems CONSTITUTIONAL: No weight loss, fever, chills, weakness or  fatigue.  HEENT: Eyes: No visual loss, blurred vision, double vision or yellow sclerae.No hearing loss, sneezing, congestion, runny nose or sore throat.  SKIN: No rash or itching.  CARDIOVASCULAR: per hpi RESPIRATORY: No shortness of breath, cough or sputum.  GASTROINTESTINAL: No anorexia, nausea, vomiting or diarrhea. No abdominal pain or blood.  GENITOURINARY: No burning on urination, no polyuria NEUROLOGICAL: No headache, dizziness, syncope, paralysis, ataxia, numbness or tingling in the extremities. No change in bowel or bladder control.  MUSCULOSKELETAL: No muscle, back pain, joint pain or stiffness.  LYMPHATICS: No enlarged nodes. No history of splenectomy.  PSYCHIATRIC: No history of depression or anxiety.  ENDOCRINOLOGIC: No reports of sweating, cold or heat intolerance. No polyuria or polydipsia.  [name]   Physical Examination Today's Vitals   05/15/21 0913  BP: (!) 140/58  Pulse: 61  SpO2: 97%  Weight: 170 lb (77.1 kg)  Height: _0  (1.651 m)   Body mass index is 28.29 kg/m.  Gen: resting comfortably, no acute distress HEENT: no scleral icterus, pupils equal round and reactive, no palptable cervical adenopathy,  CV: RRR, no m/r/g, no jvd Resp: Clear to auscultation bilaterally GI: abdomen is soft, non-tender, non-distended, normal bowel sounds, no hepatosplenomegaly MSK: extremities are warm, no edema.  Skin: warm, no rash Neuro:  no focal deficits Psych: appropriate affect   Diagnostic Studies  11/1999 Cath HEMODYNAMIC  DATA: Aortic pressure was initially extremely high at 215/92. She   was   given intravenous enalaprilat, intravenous labetalol, and intravenous   nitroglycerin, ultimately bringing her systolic blood pressure down to less   than   160. Left ventricular pressure was 180/17, with a corresponding aortic   pressure of   180/80. There was no aortic valve gradient.   LEFT VENTRICULOGRAM: Wall motion was normal. Ejection fraction was estimated   t   65%. No mitral regurgitation.   ABDOMINAL AORTOGRAM: This revealed patent renal arteries and abdominal aorta.   The   left common iliac artery had a 75% stenosis. The right external iliac artery   had a   40% stenosis.   CORONARY ARTERIOGRAPHY (Right dominant):   1. The left main was normal.   2. The left anterior descending artery had a 25% stenosis in the proximal to   mid   vessel. The distal LAD had a 50% stenosis just after the bifurcation of the   large second diagonal [name] [name]. Further down the distal LAD is a focal 80%   stenosis.   3. The left circumflex had a 20% stenosis in the mid vessel. It gave rise to a   small OM-1, a normal sized OM-2 which had a 25% stenosis, and a normal sized   OM-3 which had a 20% stenosis.   4. The right coronary artery had a diffuse 40% stenosis extending from the mid   o   distal vessel. There was a large posterior descending artery which had a   25%,   followed by a 40% stenosis. There were two small posterolateral branches.   IMPRESSIONS:   1. Normal left ventricular systolic function.   2. Peripheral vascular disease, as described.   3. One-vessel coronary artery disease with significant stenosis in the distal   left   anterior descending artery. This appears to correspond with her EKG changes   and   Cardiolite scan. She has moderate, but non-flow limiting disease in the   right coronary and left circumflex.   PLAN: Percutaneous intervention of the distal LAD, see below.  PTCA PROCEDURAL NOTE:  Following the completion of the diagnostic   catheterization,   we opted to proceed with percutaneous intervention. The preexisting 6-French   sheath in the right femoral artery was exchanged over a wire for a 7-French   sheath.   Integrilin and heparin were administered per protocol. We used a 7-French JL-4   guiding catheter and a BMW wire. The reference vessel was approximately 2 mm   in   diameter. We utilized a 2.0 x 20 mm CrossSail balloon, which was inflated   initially to 12 atm x 2 minutes and then 14 atm x 3 minutes. Final   angiographic   images revealed significant improvement in the lumen with 25% residual stenosis   and   a possible very small non-flow limiting dissection. There was TIMI-3 flow into   the   distal vessel.   COMPLICATIONS: None.   RESULTS: Successful percutaneous transluminal coronary angioplasty of the   distal   left anterior descending artery, reducing an 80% stenosis to 25% residual with   TIMI-3 flow.   PLAN: Integrilin will be continued for 20 hours. The patient needs aggressive   blood pressure control and risk factor reduction.   In regards to her peripheral vascular disease, she will be referred for further   evaluation and possible intervention.   03/2012 Echo LVEF 60-65%, no significant abnormalities     02/19/16 Clinic EKG (performed and reviewed in clinic): sinus bradycardia, no ischemic changes     02/2018 coronary CTA/FFR FINDINGS: FFR < 0.5 distal RCA suggesting severe mid RCA stenosis.   FFR 0.83 mid LAD suggesting non-hemodynamically significant mid LAD stenosis.   FFR 0.66 distal LAD suggesting significant distal LAD stenosis (versus cumulative effect of diffuse disease in LAD).   FFR 0.67 distal LCx suggesting significant stenosis mid to distal LCx (versus cumulative effect of diffuse disease in LCx).   IMPRESSION: 1.  Suspect severe mid RCA stenosis.   2. FFR significantly low in distal LAD and distal LCx. This  could suggest a single hemodynamically significant stenosis versus the cumulative effect of a long area of disease.   Suggest cardiac cath.   03/2018 cath Dist RCA lesion is 75% stenosed. Mid RCA lesion is 99% stenosed. Prox RCA to Mid RCA lesion is 60% stenosed. Drug-eluting stent was successfully placed: STENT SYNERGY DES 3.5X38., 4.0 x 28 mm and 4.0 x 12 mm. Post intervention, there is a 0% residual stenosis. Ost 1st Mrg lesion is 50% stenosed. 1st Mrg lesion is 75% stenosed. This was a small vessel. Mid Cx lesion is 60% stenosed. THis was a fairly small vessel. Mid LAD lesion is 60% stenosed. THis was not significant by CT FFR. Prox LAD to Mid LAD lesion is 25% stenosed. 2nd Diag lesion is 75% stenosed. The left ventricular systolic function is normal. LV end diastolic pressure is normal. There is no aortic valve stenosis.   Assessment and Plan  1., CAD - no recent symptoms - she is on plavix instead of aspirin per neuro for prior cva - continue current meds - EKG today shows SR, no acute ischemic changes   2. HTN -some low bp's at home at times, change hydralazine to 12.5m bid   3. Hyperlipidemia - intolerant to statins. Reported side effects on pcsk9 inhibitor -she will continue zetia, request labs from pcp   F/u 6 months   JArnoldo Lenis M.D.,

## 2021-05-26 DIAGNOSIS — G8929 Other chronic pain: Secondary | ICD-10-CM | POA: Diagnosis not present

## 2021-05-26 DIAGNOSIS — I10 Essential (primary) hypertension: Secondary | ICD-10-CM | POA: Diagnosis not present

## 2021-05-26 DIAGNOSIS — Z79899 Other long term (current) drug therapy: Secondary | ICD-10-CM | POA: Diagnosis not present

## 2021-05-26 DIAGNOSIS — Z299 Encounter for prophylactic measures, unspecified: Secondary | ICD-10-CM | POA: Diagnosis not present

## 2021-05-26 DIAGNOSIS — M542 Cervicalgia: Secondary | ICD-10-CM | POA: Diagnosis not present

## 2021-06-03 DIAGNOSIS — H401134 Primary open-angle glaucoma, bilateral, indeterminate stage: Secondary | ICD-10-CM | POA: Diagnosis not present

## 2021-06-03 DIAGNOSIS — E113293 Type 2 diabetes mellitus with mild nonproliferative diabetic retinopathy without macular edema, bilateral: Secondary | ICD-10-CM | POA: Diagnosis not present

## 2021-06-03 DIAGNOSIS — I10 Essential (primary) hypertension: Secondary | ICD-10-CM | POA: Diagnosis not present

## 2021-06-03 DIAGNOSIS — H353221 Exudative age-related macular degeneration, left eye, with active choroidal neovascularization: Secondary | ICD-10-CM | POA: Diagnosis not present

## 2021-06-03 DIAGNOSIS — Z961 Presence of intraocular lens: Secondary | ICD-10-CM | POA: Diagnosis not present

## 2021-06-03 DIAGNOSIS — Z794 Long term (current) use of insulin: Secondary | ICD-10-CM | POA: Diagnosis not present

## 2021-06-03 DIAGNOSIS — H353110 Nonexudative age-related macular degeneration, right eye, stage unspecified: Secondary | ICD-10-CM | POA: Diagnosis not present

## 2021-06-09 ENCOUNTER — Other Ambulatory Visit: Payer: Self-pay | Admitting: Cardiology

## 2021-06-15 ENCOUNTER — Telehealth: Payer: Self-pay | Admitting: Cardiology

## 2021-06-15 MED ORDER — HYDRALAZINE HCL 25 MG PO TABS: 12.5000 mg | ORAL_TABLET | Freq: Two times a day (BID) | ORAL | 3 refills | Status: DC

## 2021-06-15 NOTE — Telephone Encounter (Signed)
Pt is currently taking Hydralazine 12.5 mg tablets BID. Will send new Rx to pharmacy.

## 2021-06-15 NOTE — Telephone Encounter (Signed)
Pt called pharmacy to get a refill on hydrALAZINE (APRESOLINE) 25 MG tablet [id number] they told her the Rx had been deleted and she's needing to know if she's supposed to continue to take it.   Please call [phone]

## 2021-06-23 ENCOUNTER — Other Ambulatory Visit: Payer: Self-pay | Admitting: Cardiology

## 2021-06-24 ENCOUNTER — Telehealth: Payer: Self-pay | Admitting: Cardiology

## 2021-06-24 NOTE — Telephone Encounter (Signed)
Pharmacy is delivering today, patient aware

## 2021-06-24 NOTE — Telephone Encounter (Signed)
Pt called stating she's out of her clopidogrel (PLAVIX) 75 MG tablet [[id number]] and the pharmacy is holding her Rx till Friday and she's out now.

## 2021-06-26 DIAGNOSIS — M542 Cervicalgia: Secondary | ICD-10-CM | POA: Diagnosis not present

## 2021-06-26 DIAGNOSIS — G8929 Other chronic pain: Secondary | ICD-10-CM | POA: Diagnosis not present

## 2021-06-26 DIAGNOSIS — Z23 Encounter for immunization: Secondary | ICD-10-CM | POA: Diagnosis not present

## 2021-06-26 DIAGNOSIS — H6590 Unspecified nonsuppurative otitis media, unspecified ear: Secondary | ICD-10-CM | POA: Diagnosis not present

## 2021-06-26 DIAGNOSIS — E1165 Type 2 diabetes mellitus with hyperglycemia: Secondary | ICD-10-CM | POA: Diagnosis not present

## 2021-06-26 DIAGNOSIS — Z299 Encounter for prophylactic measures, unspecified: Secondary | ICD-10-CM | POA: Diagnosis not present

## 2021-06-26 DIAGNOSIS — I10 Essential (primary) hypertension: Secondary | ICD-10-CM | POA: Diagnosis not present

## 2021-07-03 DIAGNOSIS — I10 Essential (primary) hypertension: Secondary | ICD-10-CM | POA: Diagnosis not present

## 2021-07-14 ENCOUNTER — Other Ambulatory Visit (HOSPITAL_COMMUNITY): Payer: Self-pay | Admitting: Family Medicine

## 2021-07-14 DIAGNOSIS — Z95828 Presence of other vascular implants and grafts: Secondary | ICD-10-CM

## 2021-07-23 DIAGNOSIS — Z299 Encounter for prophylactic measures, unspecified: Secondary | ICD-10-CM | POA: Diagnosis not present

## 2021-07-23 DIAGNOSIS — M542 Cervicalgia: Secondary | ICD-10-CM | POA: Diagnosis not present

## 2021-07-23 DIAGNOSIS — I10 Essential (primary) hypertension: Secondary | ICD-10-CM | POA: Diagnosis not present

## 2021-07-23 DIAGNOSIS — E1165 Type 2 diabetes mellitus with hyperglycemia: Secondary | ICD-10-CM | POA: Diagnosis not present

## 2021-07-23 DIAGNOSIS — G8929 Other chronic pain: Secondary | ICD-10-CM | POA: Diagnosis not present

## 2021-08-03 DIAGNOSIS — E1165 Type 2 diabetes mellitus with hyperglycemia: Secondary | ICD-10-CM | POA: Diagnosis not present

## 2021-08-03 DIAGNOSIS — I10 Essential (primary) hypertension: Secondary | ICD-10-CM | POA: Diagnosis not present

## 2021-08-03 DIAGNOSIS — Z299 Encounter for prophylactic measures, unspecified: Secondary | ICD-10-CM | POA: Diagnosis not present

## 2021-08-03 DIAGNOSIS — Z23 Encounter for immunization: Secondary | ICD-10-CM | POA: Diagnosis not present

## 2021-08-03 DIAGNOSIS — Z789 Other specified health status: Secondary | ICD-10-CM | POA: Diagnosis not present

## 2021-08-03 DIAGNOSIS — N649 Disorder of breast, unspecified: Secondary | ICD-10-CM | POA: Diagnosis not present

## 2021-08-03 DIAGNOSIS — R059 Cough, unspecified: Secondary | ICD-10-CM | POA: Diagnosis not present

## 2021-08-21 DIAGNOSIS — M542 Cervicalgia: Secondary | ICD-10-CM | POA: Diagnosis not present

## 2021-08-21 DIAGNOSIS — E1165 Type 2 diabetes mellitus with hyperglycemia: Secondary | ICD-10-CM | POA: Diagnosis not present

## 2021-08-21 DIAGNOSIS — Z299 Encounter for prophylactic measures, unspecified: Secondary | ICD-10-CM | POA: Diagnosis not present

## 2021-08-21 DIAGNOSIS — Z79899 Other long term (current) drug therapy: Secondary | ICD-10-CM | POA: Diagnosis not present

## 2021-08-21 DIAGNOSIS — I4891 Unspecified atrial fibrillation: Secondary | ICD-10-CM | POA: Diagnosis not present

## 2021-08-21 DIAGNOSIS — I10 Essential (primary) hypertension: Secondary | ICD-10-CM | POA: Diagnosis not present

## 2021-08-24 ENCOUNTER — Telehealth: Payer: Self-pay | Admitting: Pharmacist

## 2021-08-24 NOTE — Telephone Encounter (Signed)
Pt listed to follow up with when Leqvio became available. She is followed in the [place] office and would need to come to [facility] for her injections. If she is agreeable to this, please have her fill out enrollment form to confirm copay. Leqvio should be covered well since she has dual Medicare and Medicaid.  If she does not wish to come to [facility] for Leqvio injections, would see if she is willing to try Praluent 75mg  Q2W as she is intolerant to Repatha (palpitations), pravastatin 20mg  daily, rosuvastatin 10mg  daily, and ezetimibe 10mg  daily. She may need updated lipid panel if she wishes to pursue Praluent instead (most recent from 01/2020, LDL was 153 at that time).

## 2021-08-24 NOTE — Telephone Encounter (Signed)
Lmom to discuss options for cholesterol management but the best option would be the praluent 75mg  q14d per insurance.

## 2021-09-02 DIAGNOSIS — I10 Essential (primary) hypertension: Secondary | ICD-10-CM | POA: Diagnosis not present

## 2021-09-18 DIAGNOSIS — Z299 Encounter for prophylactic measures, unspecified: Secondary | ICD-10-CM | POA: Diagnosis not present

## 2021-09-18 DIAGNOSIS — G8929 Other chronic pain: Secondary | ICD-10-CM | POA: Diagnosis not present

## 2021-09-18 DIAGNOSIS — Z789 Other specified health status: Secondary | ICD-10-CM | POA: Diagnosis not present

## 2021-09-18 DIAGNOSIS — I10 Essential (primary) hypertension: Secondary | ICD-10-CM | POA: Diagnosis not present

## 2021-09-18 DIAGNOSIS — M542 Cervicalgia: Secondary | ICD-10-CM | POA: Diagnosis not present

## 2021-09-21 DIAGNOSIS — J329 Chronic sinusitis, unspecified: Secondary | ICD-10-CM | POA: Diagnosis not present

## 2021-09-21 DIAGNOSIS — R0981 Nasal congestion: Secondary | ICD-10-CM | POA: Diagnosis not present

## 2021-09-21 DIAGNOSIS — R059 Cough, unspecified: Secondary | ICD-10-CM | POA: Diagnosis not present

## 2021-10-02 DIAGNOSIS — I10 Essential (primary) hypertension: Secondary | ICD-10-CM | POA: Diagnosis not present

## 2021-10-05 DIAGNOSIS — H353111 Nonexudative age-related macular degeneration, right eye, early dry stage: Secondary | ICD-10-CM | POA: Diagnosis not present

## 2021-10-05 DIAGNOSIS — H353221 Exudative age-related macular degeneration, left eye, with active choroidal neovascularization: Secondary | ICD-10-CM | POA: Diagnosis not present

## 2021-10-05 DIAGNOSIS — H401134 Primary open-angle glaucoma, bilateral, indeterminate stage: Secondary | ICD-10-CM | POA: Diagnosis not present

## 2021-10-05 DIAGNOSIS — Z961 Presence of intraocular lens: Secondary | ICD-10-CM | POA: Diagnosis not present

## 2021-10-19 ENCOUNTER — Other Ambulatory Visit: Payer: Self-pay | Admitting: Cardiology

## 2021-10-20 DIAGNOSIS — I10 Essential (primary) hypertension: Secondary | ICD-10-CM | POA: Diagnosis not present

## 2021-10-20 DIAGNOSIS — I4891 Unspecified atrial fibrillation: Secondary | ICD-10-CM | POA: Diagnosis not present

## 2021-10-20 DIAGNOSIS — J449 Chronic obstructive pulmonary disease, unspecified: Secondary | ICD-10-CM | POA: Diagnosis not present

## 2021-10-20 DIAGNOSIS — E1165 Type 2 diabetes mellitus with hyperglycemia: Secondary | ICD-10-CM | POA: Diagnosis not present

## 2021-10-20 DIAGNOSIS — Z299 Encounter for prophylactic measures, unspecified: Secondary | ICD-10-CM | POA: Diagnosis not present

## 2021-10-20 DIAGNOSIS — H9202 Otalgia, left ear: Secondary | ICD-10-CM | POA: Diagnosis not present

## 2021-10-22 ENCOUNTER — Telehealth: Payer: Self-pay

## 2021-10-22 NOTE — Telephone Encounter (Signed)
Called and lmom pt that we need to discuss cholesterol options

## 2021-10-22 NOTE — Telephone Encounter (Signed)
Called and spoke w/pt to see if they were interested in trying praluent 75mg  for cholesterol as verbally instructed by medgan supple. She stated that she was not interested in taking any more shots

## 2021-10-27 DIAGNOSIS — H401122 Primary open-angle glaucoma, left eye, moderate stage: Secondary | ICD-10-CM | POA: Diagnosis not present

## 2021-11-01 DIAGNOSIS — I10 Essential (primary) hypertension: Secondary | ICD-10-CM | POA: Diagnosis not present

## 2021-11-04 DIAGNOSIS — Z299 Encounter for prophylactic measures, unspecified: Secondary | ICD-10-CM | POA: Diagnosis not present

## 2021-11-04 DIAGNOSIS — M542 Cervicalgia: Secondary | ICD-10-CM | POA: Diagnosis not present

## 2021-11-04 DIAGNOSIS — E1165 Type 2 diabetes mellitus with hyperglycemia: Secondary | ICD-10-CM | POA: Diagnosis not present

## 2021-11-04 DIAGNOSIS — D692 Other nonthrombocytopenic purpura: Secondary | ICD-10-CM | POA: Diagnosis not present

## 2021-11-04 DIAGNOSIS — G8929 Other chronic pain: Secondary | ICD-10-CM | POA: Diagnosis not present

## 2021-11-04 DIAGNOSIS — I10 Essential (primary) hypertension: Secondary | ICD-10-CM | POA: Diagnosis not present

## 2021-11-04 DIAGNOSIS — F17210 Nicotine dependence, cigarettes, uncomplicated: Secondary | ICD-10-CM | POA: Diagnosis not present

## 2021-11-10 DIAGNOSIS — H401132 Primary open-angle glaucoma, bilateral, moderate stage: Secondary | ICD-10-CM | POA: Diagnosis not present

## 2021-11-18 ENCOUNTER — Other Ambulatory Visit: Payer: Self-pay | Admitting: Cardiology

## 2021-11-20 ENCOUNTER — Encounter: Payer: Self-pay | Admitting: Cardiology

## 2021-11-20 ENCOUNTER — Other Ambulatory Visit: Payer: Self-pay

## 2021-11-20 ENCOUNTER — Ambulatory Visit (INDEPENDENT_AMBULATORY_CARE_PROVIDER_SITE_OTHER): Payer: Medicare Other | Admitting: Cardiology

## 2021-11-20 VITALS — BP 130/65 | HR 64 | Ht 65.0 in | Wt 172.4 lb

## 2021-11-20 DIAGNOSIS — H6691 Otitis media, unspecified, right ear: Secondary | ICD-10-CM | POA: Diagnosis not present

## 2021-11-20 DIAGNOSIS — E782 Mixed hyperlipidemia: Secondary | ICD-10-CM | POA: Diagnosis not present

## 2021-11-20 DIAGNOSIS — Z789 Other specified health status: Secondary | ICD-10-CM | POA: Diagnosis not present

## 2021-11-20 DIAGNOSIS — E1165 Type 2 diabetes mellitus with hyperglycemia: Secondary | ICD-10-CM | POA: Diagnosis not present

## 2021-11-20 DIAGNOSIS — Z299 Encounter for prophylactic measures, unspecified: Secondary | ICD-10-CM | POA: Diagnosis not present

## 2021-11-20 DIAGNOSIS — I2510 Atherosclerotic heart disease of native coronary artery without angina pectoris: Secondary | ICD-10-CM

## 2021-11-20 DIAGNOSIS — I10 Essential (primary) hypertension: Secondary | ICD-10-CM | POA: Diagnosis not present

## 2021-11-20 NOTE — Progress Notes (Signed)
Clinical Summary [name] [name] is a 81 y.o.female seen today for follow up of the following medical problems.    1. CAD - prior PTCA to distal LAD in 2001. 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   - echo 06/2012 LVEF 60-65% - DSE 06/2012 with reported ST elevation in inferior leads, negative echo images for ischemia.     - 02/2018 coronary CTA suggests severe RCA disease, abnormal FFR in distal LAD and LCX as well.  - 03/2018 cath as reported below. Received DES x3  to RCA. 75% OM1 small vessel managed medically     - - no recent chest pains. - no SOB/DOE - compliant with meds     2. HTN - history of difficult to treat HTN, primarily due to reported medication side effects to several agents.  - history of angioedema on ACE-I and most recently on ARB.   - she reports amlodopine caused leg swelling (she had been on 35m).  Clonidine caused dizziness.  - we had started chlrothalidone 533mdaily. She had some muscle cramping, and the dose was decreased to 2570maily, then 67m48md and then 12.5mg 1m.  - dizziness after starting hydralazine, pins and needles feelings in ears with laying down. We have tried continuing this medication. Same side effect reported on nitrates, discontinued.  - she reports alopecia possibly due to aldactone      - home bp's daily are 110s/60s. We actually had to cut back some of her bp meds in the past due to some low bp's at home.  - compliant with meds       3. PAD - prior iliac stent in July 2001 - [place] 9/[place] Right ABI 0.99 Left ABI 0.99, with patent left iliac stent.  11/2020 ABI:rWUX:LKGMW Left 0.87      = followed by Dr EarlyDonnetta Hutching vascular - recs for ongoing monitor, f/u as needed, does not have life limiting claudication      4. Hyperlipidemia - intolerant to statins   - she reports side effects on pcsk9 inhibitor, caused signicant palpitations  =- recent labs with pcp     5.  CVA - admission 01/2020 with CVA, MRI showed acute right frontal  - neuro recommended contuing DAPT x 4 weeks, then plavix only. Completed ASA.      Past Medical History:  Diagnosis Date   Anxiety    Arthritis    "neck" (03/15/2018)   CAD (coronary artery disease)    Status post POBA distal LAD, status post Cardiolite Myoview 2008 negative for ischemia   COPD (chronic obstructive pulmonary disease) (HCC)    Depression    GERD (gastroesophageal reflux disease)    Hypercholesterolemia    Hypertension    Peripheral vascular disease (HCC) [facility] status post iliac stent July 2001    Type II diabetes mellitus (HCC)      Allergies  Allergen Reactions   Alendronate     Other reaction(s): Other (See Comments) Leg edema   Clonidine Derivatives Other (See Comments)    dizziness   Crestor [Rosuvastatin]     Muscle cramps   Lisinopril Other (See Comments)    Tongue swelling   Losartan Other (See Comments)    TONGUE SWELLING   Codeine Nausea And Vomiting   Hydrocodone Nausea And Vomiting     Current Outpatient Medications  Medication Sig Dispense Refill   amLODipine (NORVASC) 5 MG tablet TAKE 1  TABLET BY MOUTH ONCE DAILY. 90 tablet 3   bimatoprost (LUMIGAN) 0.01 % SOLN Place 1 drop into both eyes at bedtime.     clopidogrel (PLAVIX) 75 MG tablet TAKE 1 TABLET ONCE DAILY. 30 tablet 0   ezetimibe (ZETIA) 10 MG tablet TAKE 1 TABLET ONCE DAILY. 90 tablet 3   hydrALAZINE (APRESOLINE) 25 MG tablet Take 0.5 tablets (12.5 mg total) by mouth 2 (two) times daily. 90 tablet 3   icosapent Ethyl (VASCEPA) 1 g capsule TAKE 1 CAPSULE BY MOUTH TWICE DAILY. 60 capsule 5   insulin glargine (LANTUS) 100 UNIT/ML injection Inject 12-50 Units into the skin daily. Inject 12 units in the morning and 50 in the evening     insulin lispro (HUMALOG) 100 UNIT/ML injection Inject 16 Units into the skin 3 (three) times daily with meals.      labetalol (NORMODYNE) 200 MG tablet TAKE (1) TABLET TWICE DAILY. 60  tablet 6   nitroGLYCERIN (NITROSTAT) 0.4 MG SL tablet Place 1 tablet (0.4 mg total) under the tongue every 5 (five) minutes as needed for chest pain. 25 tablet 12   oxyCODONE-acetaminophen (PERCOCET) 10-325 MG tablet Take 1 tablet by mouth 2 (two) times daily.     Semaglutide (OZEMPIC, 0.25 OR 0.5 MG/DOSE, [place]) Inject into the skin once a week.     No current facility-administered medications for this visit.     Past Surgical History:  Procedure Laterality Date   ANTERIOR CERVICAL DECOMP/DISCECTOMY FUSION  2001   BACK SURGERY     BREAST SURGERY     biopsy left breat-benign   CATARACT EXTRACTION, BILATERAL Bilateral    CHOLECYSTECTOMY OPEN     CORONARY ANGIOPLASTY     POBA distal LAD,   CORONARY STENT INTERVENTION N/A 03/15/2018   Procedure: CORONARY STENT INTERVENTION;  Surgeon: Jettie Booze, MD;  Location: [facility];  Service: Cardiovascular;  Laterality: N/A;   DILATION AND CURETTAGE OF UTERUS  1960s   EYE SURGERY Bilateral    "lasers"   LEFT HEART CATH AND CORONARY ANGIOGRAPHY N/A 03/15/2018   Procedure: LEFT HEART CATH AND CORONARY ANGIOGRAPHY;  Surgeon: Jettie Booze, MD;  Location: [facility];  Service: Cardiovascular;  Laterality: N/A;   PERIPHERAL VASCULAR INTERVENTION Right 04/2000    iliac stent    TUBAL LIGATION       Allergies  Allergen Reactions   Alendronate     Other reaction(s): Other (See Comments) Leg edema   Clonidine Derivatives Other (See Comments)    dizziness   Crestor [Rosuvastatin]     Muscle cramps   Lisinopril Other (See Comments)    Tongue swelling   Losartan Other (See Comments)    TONGUE SWELLING   Codeine Nausea And Vomiting   Hydrocodone Nausea And Vomiting      Family History  Problem Relation Age of Onset   Heart failure Father    Heart failure Mother    Diabetes Mother    COPD Brother      Social History [name] [name] reports that she quit smoking about 26 years ago. Her smoking use included  cigarettes. She started smoking about 60 years ago. She has a 20.00 pack-year smoking history. She has never used smokeless tobacco. [name] [name] reports no history of alcohol use.   Review of Systems CONSTITUTIONAL: No weight loss, fever, chills, weakness or fatigue.  HEENT: Eyes: No visual loss, blurred vision, double vision or yellow sclerae.No hearing loss, sneezing, congestion, runny nose or sore throat.  SKIN: No rash or itching.  CARDIOVASCULAR: per hpi RESPIRATORY: No shortness of breath, cough or sputum.  GASTROINTESTINAL: No anorexia, nausea, vomiting or diarrhea. No abdominal pain or blood.  GENITOURINARY: No burning on urination, no polyuria NEUROLOGICAL: No headache, dizziness, syncope, paralysis, ataxia, numbness or tingling in the extremities. No change in bowel or bladder control.  MUSCULOSKELETAL: No muscle, back pain, joint pain or stiffness.  LYMPHATICS: No enlarged nodes. No history of splenectomy.  PSYCHIATRIC: No history of depression or anxiety.  ENDOCRINOLOGIC: No reports of sweating, cold or heat intolerance. No polyuria or polydipsia.  [name]   Physical Examination Today's Vitals   11/20/21 0909 11/20/21 0934  BP: (!) 166/62 130/65  Pulse: 64   SpO2: 98%   Weight: 172 lb 6.4 oz (78.2 kg)   Height: _0  (1.651 m)    Body mass index is 28.69 kg/m.  Gen: resting comfortably, no acute distress HEENT: no scleral icterus, pupils equal round and reactive, no palptable cervical adenopathy,  CV: RRR, no m/r/g, no jvd Resp: Clear to auscultation bilaterally GI: abdomen is soft, non-tender, non-distended, normal bowel sounds, no hepatosplenomegaly MSK: extremities are warm, no edema.  Skin: warm, no rash Neuro:  no focal deficits Psych: appropriate affect   Diagnostic Studies  11/1999 Cath HEMODYNAMIC DATA: Aortic pressure was initially extremely high at 215/92. She   was   given intravenous enalaprilat, intravenous labetalol, and intravenous   nitroglycerin,  ultimately bringing her systolic blood pressure down to less   than   160. Left ventricular pressure was 180/17, with a corresponding aortic   pressure of   180/80. There was no aortic valve gradient.   LEFT VENTRICULOGRAM: Wall motion was normal. Ejection fraction was estimated   t   65%. No mitral regurgitation.   ABDOMINAL AORTOGRAM: This revealed patent renal arteries and abdominal aorta.   The   left common iliac artery had a 75% stenosis. The right external iliac artery   had a   40% stenosis.   CORONARY ARTERIOGRAPHY (Right dominant):   1. The left main was normal.   2. The left anterior descending artery had a 25% stenosis in the proximal to   mid   vessel. The distal LAD had a 50% stenosis just after the bifurcation of the   large second diagonal [name] [name]. Further down the distal LAD is a focal 80%   stenosis.   3. The left circumflex had a 20% stenosis in the mid vessel. It gave rise to a   small OM-1, a normal sized OM-2 which had a 25% stenosis, and a normal sized   OM-3 which had a 20% stenosis.   4. The right coronary artery had a diffuse 40% stenosis extending from the mid   o   distal vessel. There was a large posterior descending artery which had a   25%,   followed by a 40% stenosis. There were two small posterolateral branches.   IMPRESSIONS:   1. Normal left ventricular systolic function.   2. Peripheral vascular disease, as described.   3. One-vessel coronary artery disease with significant stenosis in the distal   left   anterior descending artery. This appears to correspond with her EKG changes   and   Cardiolite scan. She has moderate, but non-flow limiting disease in the   right coronary and left circumflex.   PLAN: Percutaneous intervention of the distal LAD, see below.   PTCA PROCEDURAL NOTE: Following the completion of the diagnostic   catheterization,  we opted to proceed with percutaneous intervention. The preexisting 6-French   sheath in the  right femoral artery was exchanged over a wire for a 7-French   sheath.   Integrilin and heparin were administered per protocol. We used a 7-French JL-4   guiding catheter and a BMW wire. The reference vessel was approximately 2 mm   in   diameter. We utilized a 2.0 x 20 mm CrossSail balloon, which was inflated   initially to 12 atm x 2 minutes and then 14 atm x 3 minutes. Final   angiographic   images revealed significant improvement in the lumen with 25% residual stenosis   and   a possible very small non-flow limiting dissection. There was TIMI-3 flow into   the   distal vessel.   COMPLICATIONS: None.   RESULTS: Successful percutaneous transluminal coronary angioplasty of the   distal   left anterior descending artery, reducing an 80% stenosis to 25% residual with   TIMI-3 flow.   PLAN: Integrilin will be continued for 20 hours. The patient needs aggressive   blood pressure control and risk factor reduction.   In regards to her peripheral vascular disease, she will be referred for further   evaluation and possible intervention.   03/2012 Echo LVEF 60-65%, no significant abnormalities     02/19/16 Clinic EKG (performed and reviewed in clinic): sinus bradycardia, no ischemic changes     02/2018 coronary CTA/FFR FINDINGS: FFR < 0.5 distal RCA suggesting severe mid RCA stenosis.   FFR 0.83 mid LAD suggesting non-hemodynamically significant mid LAD stenosis.   FFR 0.66 distal LAD suggesting significant distal LAD stenosis (versus cumulative effect of diffuse disease in LAD).   FFR 0.67 distal LCx suggesting significant stenosis mid to distal LCx (versus cumulative effect of diffuse disease in LCx).   IMPRESSION: 1.  Suspect severe mid RCA stenosis.   2. FFR significantly low in distal LAD and distal LCx. This could suggest a single hemodynamically significant stenosis versus the cumulative effect of a long area of disease.   Suggest cardiac cath.   03/2018 cath Dist RCA  lesion is 75% stenosed. Mid RCA lesion is 99% stenosed. Prox RCA to Mid RCA lesion is 60% stenosed. Drug-eluting stent was successfully placed: STENT SYNERGY DES 3.5X38., 4.0 x 28 mm and 4.0 x 12 mm. Post intervention, there is a 0% residual stenosis. Ost 1st Mrg lesion is 50% stenosed. 1st Mrg lesion is 75% stenosed. This was a small vessel. Mid Cx lesion is 60% stenosed. THis was a fairly small vessel. Mid LAD lesion is 60% stenosed. THis was not significant by CT FFR. Prox LAD to Mid LAD lesion is 25% stenosed. 2nd Diag lesion is 75% stenosed. The left ventricular systolic function is normal. LV end diastolic pressure is normal. There is no aortic valve stenosis.     Assessment and Plan  1., CAD - no symptoms - she is on plavix instead of aspirin per neuro for prior cva - she will continue current meds   2. HTN - bp's at goal, often run higher in clinic than homenumers   3. Hyperlipidemia - intolerant to statins. Reported side effects on pcsk9 inhibitor Continue zetia, request labs from pcp      [name], M.D.

## 2021-11-20 NOTE — Patient Instructions (Signed)

## 2021-12-01 DIAGNOSIS — I10 Essential (primary) hypertension: Secondary | ICD-10-CM | POA: Diagnosis not present

## 2021-12-17 DIAGNOSIS — M542 Cervicalgia: Secondary | ICD-10-CM | POA: Diagnosis not present

## 2021-12-17 DIAGNOSIS — I10 Essential (primary) hypertension: Secondary | ICD-10-CM | POA: Diagnosis not present

## 2021-12-17 DIAGNOSIS — H6691 Otitis media, unspecified, right ear: Secondary | ICD-10-CM | POA: Diagnosis not present

## 2021-12-17 DIAGNOSIS — Z299 Encounter for prophylactic measures, unspecified: Secondary | ICD-10-CM | POA: Diagnosis not present

## 2021-12-17 DIAGNOSIS — D6869 Other thrombophilia: Secondary | ICD-10-CM | POA: Diagnosis not present

## 2021-12-17 DIAGNOSIS — G8929 Other chronic pain: Secondary | ICD-10-CM | POA: Diagnosis not present

## 2021-12-22 DIAGNOSIS — D492 Neoplasm of unspecified behavior of bone, soft tissue, and skin: Secondary | ICD-10-CM | POA: Diagnosis not present

## 2021-12-22 DIAGNOSIS — D23111 Other benign neoplasm of skin of right upper eyelid, including canthus: Secondary | ICD-10-CM | POA: Diagnosis not present

## 2021-12-28 IMAGING — MR MR HEAD W/O CM
10 of 11 series · 35 of 48 positions shown · non-contrast
Comparison: None.

CLINICAL DATA: Weakness and dizziness

EXAM:
MRI HEAD WITHOUT CONTRAST
MRA HEAD WITHOUT CONTRAST
TECHNIQUE: Multiplanar, multiecho pulse sequences of the brain and surrounding
structures were obtained without intravenous contrast. Angiographic
images of the head were obtained using MRA technique without
contrast.

[Series 2: T1 · sagittal · 5.0mm · 0.45mm/px · 2 of 20 slices shown (1 of 2)]
[im 1/20]
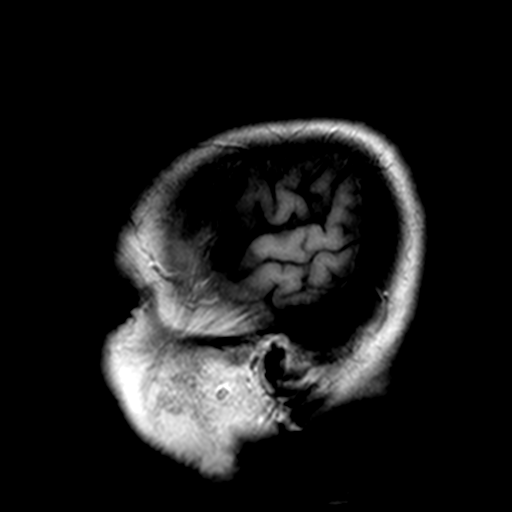
[im 20/20]
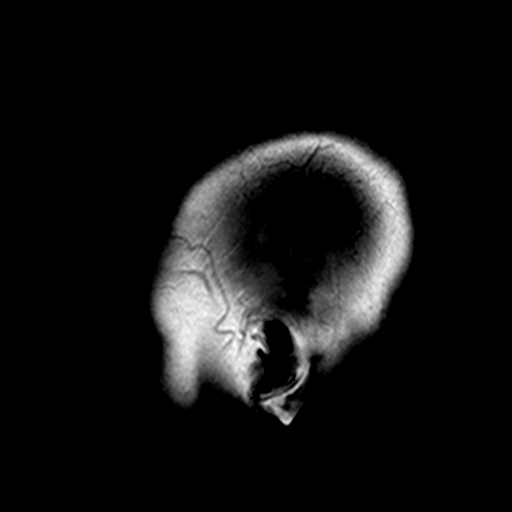

[Series 3: DWI · axial · 3.0mm · 0.82mm/px · z∈[-130,+32]mm · 5 of 55 slices shown (1 of 2)]
[im 1/55]
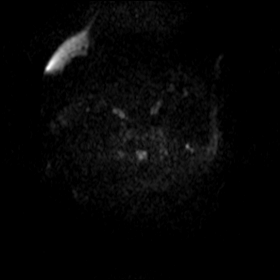
[im 14/55]
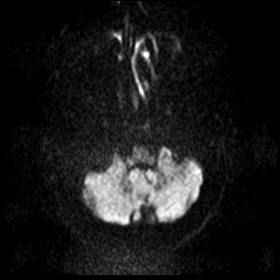
[im 28/55]
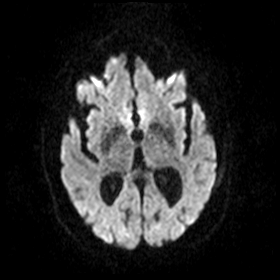
[im 41/55]
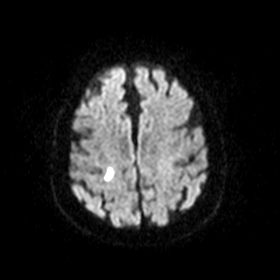
[im 55/55]
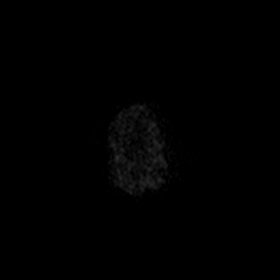

[Series 4: ax dwi_adc · axial · 3.0mm · 0.82mm/px · z∈[-130,+32]mm · 5 of 55 slices shown]
[im 1/55]
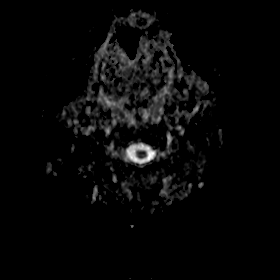
[im 14/55]
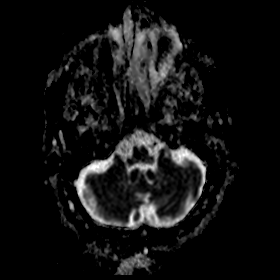
[im 28/55]
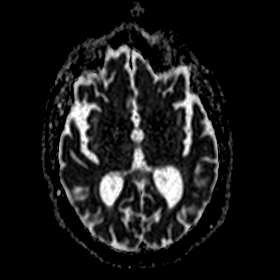
[im 41/55]
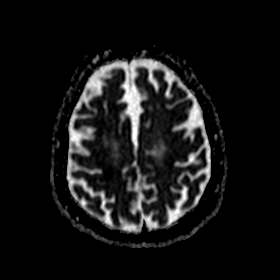
[im 55/55]
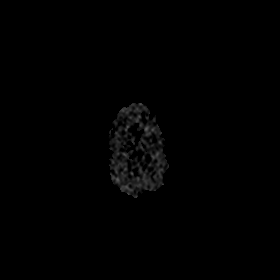

[Series 5: DWI · coronal · 5.0mm · 0.48mm/px · 3 of 34 slices shown (2 of 2)]
[im 1/34]
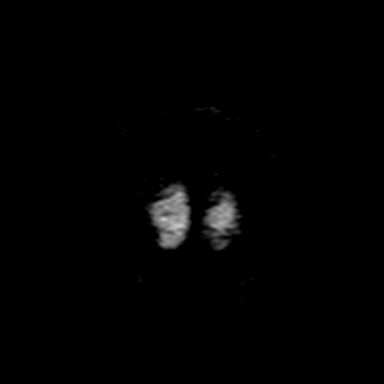
[im 17/34]
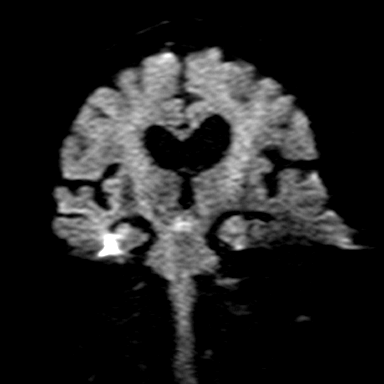
[im 34/34]
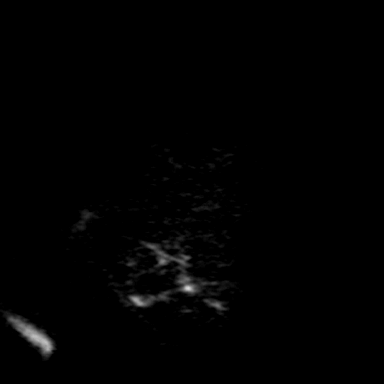

[Series 6: cor dwi_adc · coronal · 5.0mm · 0.48mm/px · 2 of 34 slices shown]
[im 1/34]
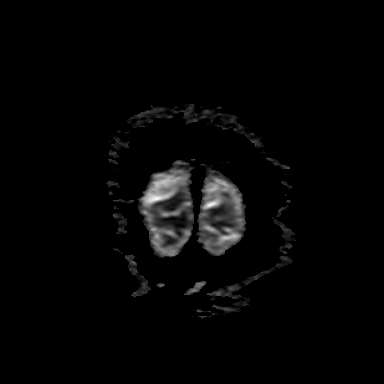
[im 17/34]
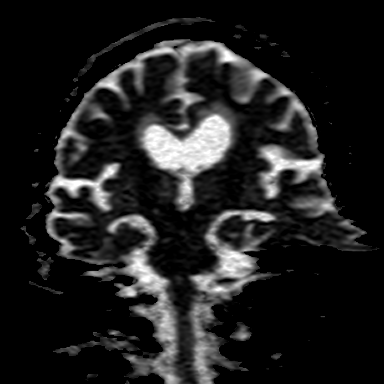

[Series 7: T2 · axial · 5.0mm · 0.75mm/px · z∈[-121,+22]mm · 2 of 23 slices shown (1 of 3)]
[im 1/23]
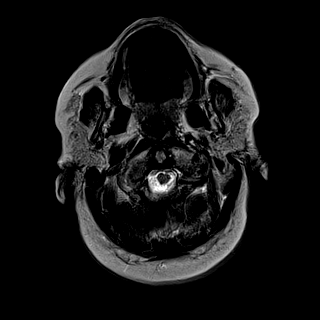
[im 23/23]
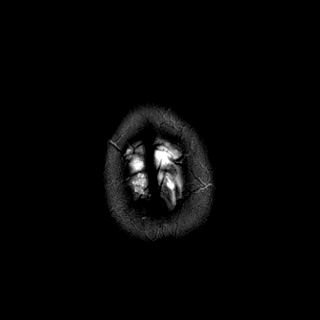

[Series 8: FLAIR · axial · 3.0mm · 0.94mm/px · z∈[-118,+20]mm · 4 of 47 slices shown]
[im 1/47]
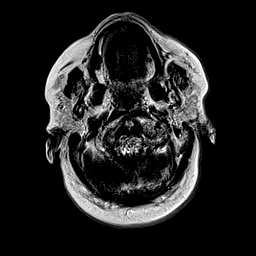
[im 16/47]
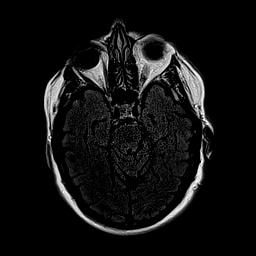
[im 31/47]
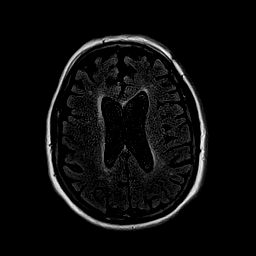
[im 47/47]
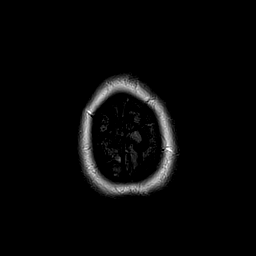

[Series 9: T2 · axial · 5.0mm · 0.45mm/px · z∈[-114,+16]mm · 2 of 21 slices shown (2 of 3)]
[im 1/21]
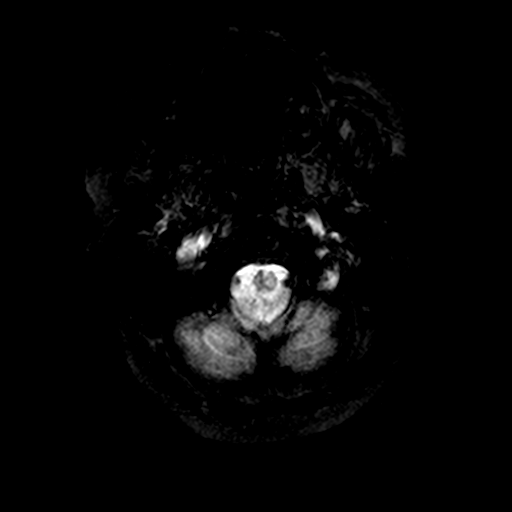
[im 21/21]
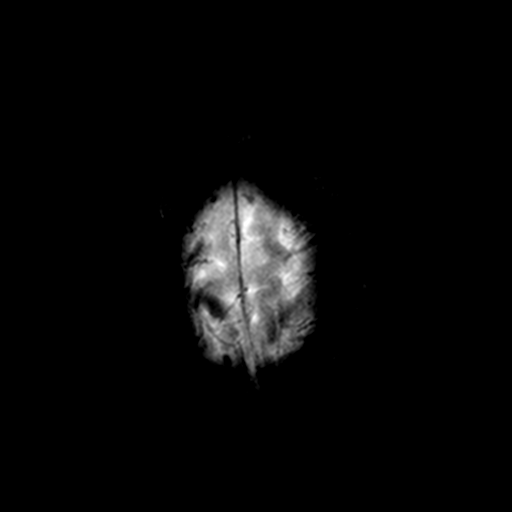

[Series 10: T1 · axial · 2.0mm · 0.47mm/px · z∈[-134,+54]mm · 8 of 95 slices shown (2 of 2)]
[im 1/95]
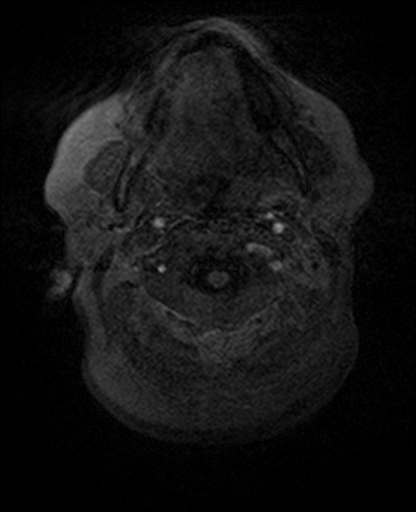
[im 14/95]
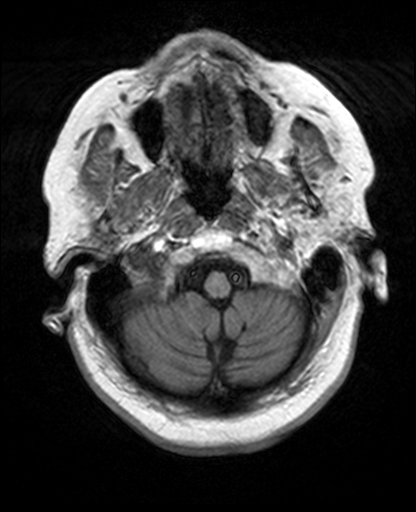
[im 27/95]
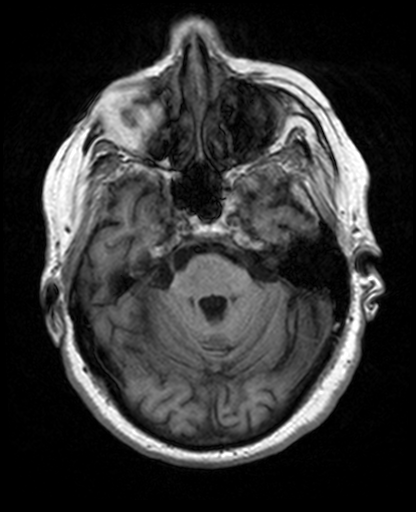
[im 41/95]
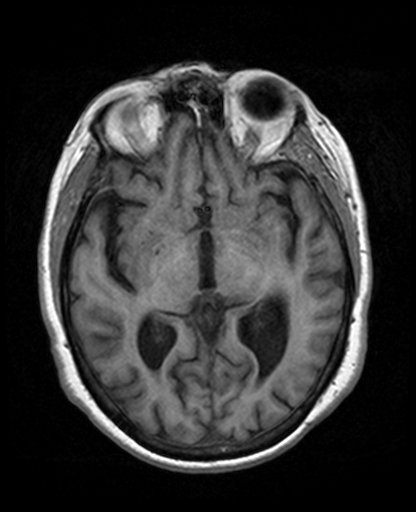
[im 54/95]
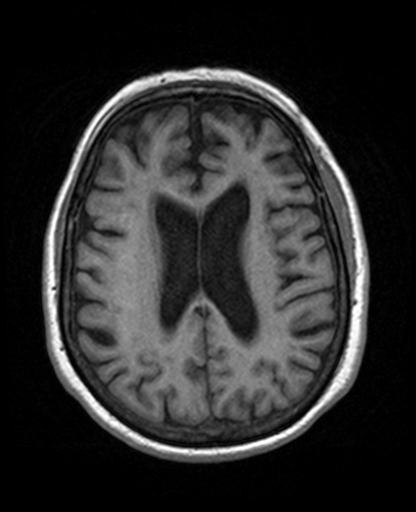
[im 68/95]
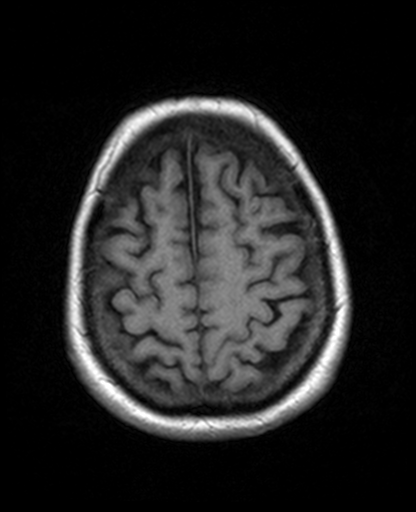
[im 81/95]
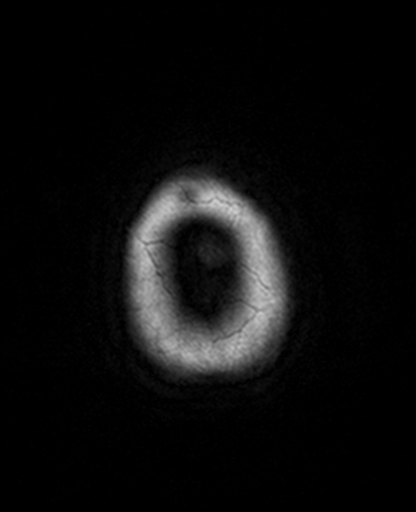
[im 95/95]
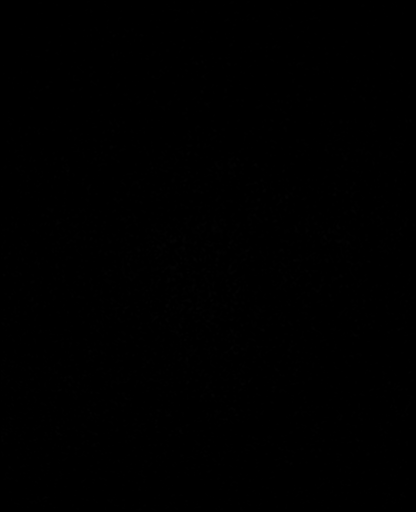

[Series 11: T2 · coronal · 5.0mm · 0.61mm/px · 2 of 28 slices shown (3 of 3)]
[im 1/28]
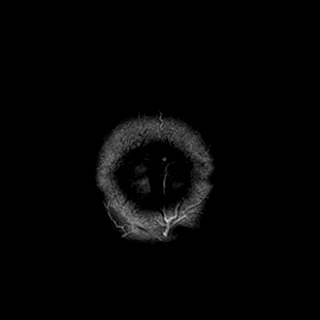
[im 28/28]
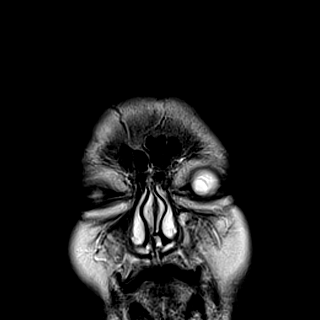

[35 of 48 positions shown; findings below may reference images not displayed]

FINDINGS: MRI HEAD FINDINGS

Brain: There is a 10 mm focus of restricted diffusion within the
right centrum semiovale.

There is no evidence of intracranial hemorrhage. Prominence of the
ventricles and sulci reflects generalized parenchymal volume loss.
Patchy and confluent areas of T2 hyperintensity in the
supratentorial white matter are nonspecific but probably reflect
mild to moderate chronic microvascular ischemic changes. There is a
chronic small vessel infarct of the left basal ganglia.

Vascular: Major vessel flow voids at the skull base are preserved.

Skull and upper cervical spine: Marrow signal is within normal
limits.

Sinuses/Orbits: Paranasal sinus mucosal thickening, greatest within
the left maxillary sinus. Bilateral lens replacements.

Other: Mastoid air cells are clear.  Sella is unremarkable.

MRA HEAD FINDINGS

Intracranial internal carotid arteries are patent with
atherosclerotic irregularity. Middle and anterior cerebral arteries
are patent. Intracranial vertebral arteries, basilar artery,
posterior cerebral arteries are patent. There is no significant
stenosis or aneurysm.
IMPRESSION: Small acute infarct of the right frontal central white matter.
Chronic microvascular ischemic changes.

No proximal intracranial vessel occlusion or significant stenosis.

## 2021-12-31 DIAGNOSIS — I10 Essential (primary) hypertension: Secondary | ICD-10-CM | POA: Diagnosis not present

## 2022-01-18 DIAGNOSIS — Z789 Other specified health status: Secondary | ICD-10-CM | POA: Diagnosis not present

## 2022-01-18 DIAGNOSIS — G8929 Other chronic pain: Secondary | ICD-10-CM | POA: Diagnosis not present

## 2022-01-18 DIAGNOSIS — E1129 Type 2 diabetes mellitus with other diabetic kidney complication: Secondary | ICD-10-CM | POA: Diagnosis not present

## 2022-01-18 DIAGNOSIS — M542 Cervicalgia: Secondary | ICD-10-CM | POA: Diagnosis not present

## 2022-01-18 DIAGNOSIS — Z299 Encounter for prophylactic measures, unspecified: Secondary | ICD-10-CM | POA: Diagnosis not present

## 2022-01-18 DIAGNOSIS — R809 Proteinuria, unspecified: Secondary | ICD-10-CM | POA: Diagnosis not present

## 2022-01-18 DIAGNOSIS — I10 Essential (primary) hypertension: Secondary | ICD-10-CM | POA: Diagnosis not present

## 2022-01-19 DIAGNOSIS — Z961 Presence of intraocular lens: Secondary | ICD-10-CM | POA: Diagnosis not present

## 2022-01-19 DIAGNOSIS — H5203 Hypermetropia, bilateral: Secondary | ICD-10-CM | POA: Diagnosis not present

## 2022-01-19 DIAGNOSIS — H52223 Regular astigmatism, bilateral: Secondary | ICD-10-CM | POA: Diagnosis not present

## 2022-01-19 DIAGNOSIS — H524 Presbyopia: Secondary | ICD-10-CM | POA: Diagnosis not present

## 2022-01-19 DIAGNOSIS — H401130 Primary open-angle glaucoma, bilateral, stage unspecified: Secondary | ICD-10-CM | POA: Diagnosis not present

## 2022-01-19 DIAGNOSIS — H353130 Nonexudative age-related macular degeneration, bilateral, stage unspecified: Secondary | ICD-10-CM | POA: Diagnosis not present

## 2022-01-19 DIAGNOSIS — E119 Type 2 diabetes mellitus without complications: Secondary | ICD-10-CM | POA: Diagnosis not present

## 2022-01-21 DIAGNOSIS — S76111A Strain of right quadriceps muscle, fascia and tendon, initial encounter: Secondary | ICD-10-CM | POA: Diagnosis not present

## 2022-01-21 DIAGNOSIS — M62838 Other muscle spasm: Secondary | ICD-10-CM | POA: Diagnosis not present

## 2022-01-21 DIAGNOSIS — T148XXA Other injury of unspecified body region, initial encounter: Secondary | ICD-10-CM | POA: Diagnosis not present

## 2022-01-21 DIAGNOSIS — W19XXXA Unspecified fall, initial encounter: Secondary | ICD-10-CM | POA: Diagnosis not present

## 2022-01-31 DIAGNOSIS — I10 Essential (primary) hypertension: Secondary | ICD-10-CM | POA: Diagnosis not present

## 2022-02-03 DIAGNOSIS — M179 Osteoarthritis of knee, unspecified: Secondary | ICD-10-CM | POA: Diagnosis not present

## 2022-02-03 DIAGNOSIS — Z299 Encounter for prophylactic measures, unspecified: Secondary | ICD-10-CM | POA: Diagnosis not present

## 2022-02-03 DIAGNOSIS — M545 Low back pain, unspecified: Secondary | ICD-10-CM | POA: Diagnosis not present

## 2022-02-03 DIAGNOSIS — E1165 Type 2 diabetes mellitus with hyperglycemia: Secondary | ICD-10-CM | POA: Diagnosis not present

## 2022-02-03 DIAGNOSIS — I10 Essential (primary) hypertension: Secondary | ICD-10-CM | POA: Diagnosis not present

## 2022-02-03 DIAGNOSIS — Z789 Other specified health status: Secondary | ICD-10-CM | POA: Diagnosis not present

## 2022-02-04 DIAGNOSIS — E1165 Type 2 diabetes mellitus with hyperglycemia: Secondary | ICD-10-CM | POA: Diagnosis not present

## 2022-02-04 DIAGNOSIS — J449 Chronic obstructive pulmonary disease, unspecified: Secondary | ICD-10-CM | POA: Diagnosis not present

## 2022-02-04 DIAGNOSIS — M545 Low back pain, unspecified: Secondary | ICD-10-CM | POA: Diagnosis not present

## 2022-02-04 DIAGNOSIS — M179 Osteoarthritis of knee, unspecified: Secondary | ICD-10-CM | POA: Diagnosis not present

## 2022-02-10 DIAGNOSIS — M9901 Segmental and somatic dysfunction of cervical region: Secondary | ICD-10-CM | POA: Diagnosis not present

## 2022-02-10 DIAGNOSIS — M9903 Segmental and somatic dysfunction of lumbar region: Secondary | ICD-10-CM | POA: Diagnosis not present

## 2022-02-10 DIAGNOSIS — M9902 Segmental and somatic dysfunction of thoracic region: Secondary | ICD-10-CM | POA: Diagnosis not present

## 2022-02-10 DIAGNOSIS — M62830 Muscle spasm of back: Secondary | ICD-10-CM | POA: Diagnosis not present

## 2022-02-11 DIAGNOSIS — M9901 Segmental and somatic dysfunction of cervical region: Secondary | ICD-10-CM | POA: Diagnosis not present

## 2022-02-11 DIAGNOSIS — M9903 Segmental and somatic dysfunction of lumbar region: Secondary | ICD-10-CM | POA: Diagnosis not present

## 2022-02-11 DIAGNOSIS — M9902 Segmental and somatic dysfunction of thoracic region: Secondary | ICD-10-CM | POA: Diagnosis not present

## 2022-02-11 DIAGNOSIS — M62830 Muscle spasm of back: Secondary | ICD-10-CM | POA: Diagnosis not present

## 2022-02-15 ENCOUNTER — Other Ambulatory Visit: Payer: Self-pay | Admitting: Cardiology

## 2022-02-15 DIAGNOSIS — M9902 Segmental and somatic dysfunction of thoracic region: Secondary | ICD-10-CM | POA: Diagnosis not present

## 2022-02-15 DIAGNOSIS — M62830 Muscle spasm of back: Secondary | ICD-10-CM | POA: Diagnosis not present

## 2022-02-15 DIAGNOSIS — M9901 Segmental and somatic dysfunction of cervical region: Secondary | ICD-10-CM | POA: Diagnosis not present

## 2022-02-15 DIAGNOSIS — M9903 Segmental and somatic dysfunction of lumbar region: Secondary | ICD-10-CM | POA: Diagnosis not present

## 2022-02-17 ENCOUNTER — Other Ambulatory Visit: Payer: Self-pay | Admitting: Cardiology

## 2022-02-17 ENCOUNTER — Other Ambulatory Visit (HOSPITAL_COMMUNITY): Payer: Self-pay | Admitting: Family Medicine

## 2022-02-17 DIAGNOSIS — I739 Peripheral vascular disease, unspecified: Secondary | ICD-10-CM

## 2022-02-17 DIAGNOSIS — M9902 Segmental and somatic dysfunction of thoracic region: Secondary | ICD-10-CM | POA: Diagnosis not present

## 2022-02-17 DIAGNOSIS — Z79899 Other long term (current) drug therapy: Secondary | ICD-10-CM | POA: Diagnosis not present

## 2022-02-17 DIAGNOSIS — M542 Cervicalgia: Secondary | ICD-10-CM | POA: Diagnosis not present

## 2022-02-17 DIAGNOSIS — G8929 Other chronic pain: Secondary | ICD-10-CM | POA: Diagnosis not present

## 2022-02-17 DIAGNOSIS — M62830 Muscle spasm of back: Secondary | ICD-10-CM | POA: Diagnosis not present

## 2022-02-17 DIAGNOSIS — Z299 Encounter for prophylactic measures, unspecified: Secondary | ICD-10-CM | POA: Diagnosis not present

## 2022-02-17 DIAGNOSIS — M9901 Segmental and somatic dysfunction of cervical region: Secondary | ICD-10-CM | POA: Diagnosis not present

## 2022-02-17 DIAGNOSIS — I10 Essential (primary) hypertension: Secondary | ICD-10-CM | POA: Diagnosis not present

## 2022-02-17 DIAGNOSIS — M9903 Segmental and somatic dysfunction of lumbar region: Secondary | ICD-10-CM | POA: Diagnosis not present

## 2022-02-18 DIAGNOSIS — M9903 Segmental and somatic dysfunction of lumbar region: Secondary | ICD-10-CM | POA: Diagnosis not present

## 2022-02-18 DIAGNOSIS — M9901 Segmental and somatic dysfunction of cervical region: Secondary | ICD-10-CM | POA: Diagnosis not present

## 2022-02-18 DIAGNOSIS — M9902 Segmental and somatic dysfunction of thoracic region: Secondary | ICD-10-CM | POA: Diagnosis not present

## 2022-02-18 DIAGNOSIS — M62830 Muscle spasm of back: Secondary | ICD-10-CM | POA: Diagnosis not present

## 2022-02-22 DIAGNOSIS — M9902 Segmental and somatic dysfunction of thoracic region: Secondary | ICD-10-CM | POA: Diagnosis not present

## 2022-02-22 DIAGNOSIS — M62830 Muscle spasm of back: Secondary | ICD-10-CM | POA: Diagnosis not present

## 2022-02-22 DIAGNOSIS — M9903 Segmental and somatic dysfunction of lumbar region: Secondary | ICD-10-CM | POA: Diagnosis not present

## 2022-02-22 DIAGNOSIS — M9901 Segmental and somatic dysfunction of cervical region: Secondary | ICD-10-CM | POA: Diagnosis not present

## 2022-02-23 DIAGNOSIS — E113393 Type 2 diabetes mellitus with moderate nonproliferative diabetic retinopathy without macular edema, bilateral: Secondary | ICD-10-CM | POA: Diagnosis not present

## 2022-02-23 DIAGNOSIS — H353223 Exudative age-related macular degeneration, left eye, with inactive scar: Secondary | ICD-10-CM | POA: Diagnosis not present

## 2022-02-23 DIAGNOSIS — H353113 Nonexudative age-related macular degeneration, right eye, advanced atrophic without subfoveal involvement: Secondary | ICD-10-CM | POA: Diagnosis not present

## 2022-02-23 DIAGNOSIS — H26492 Other secondary cataract, left eye: Secondary | ICD-10-CM | POA: Diagnosis not present

## 2022-02-25 DIAGNOSIS — M9901 Segmental and somatic dysfunction of cervical region: Secondary | ICD-10-CM | POA: Diagnosis not present

## 2022-02-25 DIAGNOSIS — M62830 Muscle spasm of back: Secondary | ICD-10-CM | POA: Diagnosis not present

## 2022-02-25 DIAGNOSIS — M9903 Segmental and somatic dysfunction of lumbar region: Secondary | ICD-10-CM | POA: Diagnosis not present

## 2022-02-25 DIAGNOSIS — M9902 Segmental and somatic dysfunction of thoracic region: Secondary | ICD-10-CM | POA: Diagnosis not present

## 2022-03-02 DIAGNOSIS — M9902 Segmental and somatic dysfunction of thoracic region: Secondary | ICD-10-CM | POA: Diagnosis not present

## 2022-03-02 DIAGNOSIS — M62830 Muscle spasm of back: Secondary | ICD-10-CM | POA: Diagnosis not present

## 2022-03-02 DIAGNOSIS — I10 Essential (primary) hypertension: Secondary | ICD-10-CM | POA: Diagnosis not present

## 2022-03-02 DIAGNOSIS — M9903 Segmental and somatic dysfunction of lumbar region: Secondary | ICD-10-CM | POA: Diagnosis not present

## 2022-03-02 DIAGNOSIS — M9901 Segmental and somatic dysfunction of cervical region: Secondary | ICD-10-CM | POA: Diagnosis not present

## 2022-03-03 DIAGNOSIS — M9901 Segmental and somatic dysfunction of cervical region: Secondary | ICD-10-CM | POA: Diagnosis not present

## 2022-03-03 DIAGNOSIS — M9902 Segmental and somatic dysfunction of thoracic region: Secondary | ICD-10-CM | POA: Diagnosis not present

## 2022-03-03 DIAGNOSIS — M62830 Muscle spasm of back: Secondary | ICD-10-CM | POA: Diagnosis not present

## 2022-03-03 DIAGNOSIS — M9903 Segmental and somatic dysfunction of lumbar region: Secondary | ICD-10-CM | POA: Diagnosis not present

## 2022-03-04 DIAGNOSIS — M9902 Segmental and somatic dysfunction of thoracic region: Secondary | ICD-10-CM | POA: Diagnosis not present

## 2022-03-04 DIAGNOSIS — J449 Chronic obstructive pulmonary disease, unspecified: Secondary | ICD-10-CM | POA: Diagnosis not present

## 2022-03-04 DIAGNOSIS — E1165 Type 2 diabetes mellitus with hyperglycemia: Secondary | ICD-10-CM | POA: Diagnosis not present

## 2022-03-04 DIAGNOSIS — M62830 Muscle spasm of back: Secondary | ICD-10-CM | POA: Diagnosis not present

## 2022-03-04 DIAGNOSIS — M9901 Segmental and somatic dysfunction of cervical region: Secondary | ICD-10-CM | POA: Diagnosis not present

## 2022-03-04 DIAGNOSIS — M9903 Segmental and somatic dysfunction of lumbar region: Secondary | ICD-10-CM | POA: Diagnosis not present

## 2022-03-04 DIAGNOSIS — M545 Low back pain, unspecified: Secondary | ICD-10-CM | POA: Diagnosis not present

## 2022-03-04 DIAGNOSIS — M179 Osteoarthritis of knee, unspecified: Secondary | ICD-10-CM | POA: Diagnosis not present

## 2022-03-08 DIAGNOSIS — M9902 Segmental and somatic dysfunction of thoracic region: Secondary | ICD-10-CM | POA: Diagnosis not present

## 2022-03-08 DIAGNOSIS — M9903 Segmental and somatic dysfunction of lumbar region: Secondary | ICD-10-CM | POA: Diagnosis not present

## 2022-03-08 DIAGNOSIS — M62830 Muscle spasm of back: Secondary | ICD-10-CM | POA: Diagnosis not present

## 2022-03-08 DIAGNOSIS — M9901 Segmental and somatic dysfunction of cervical region: Secondary | ICD-10-CM | POA: Diagnosis not present

## 2022-03-10 DIAGNOSIS — M9902 Segmental and somatic dysfunction of thoracic region: Secondary | ICD-10-CM | POA: Diagnosis not present

## 2022-03-10 DIAGNOSIS — M62830 Muscle spasm of back: Secondary | ICD-10-CM | POA: Diagnosis not present

## 2022-03-10 DIAGNOSIS — M9903 Segmental and somatic dysfunction of lumbar region: Secondary | ICD-10-CM | POA: Diagnosis not present

## 2022-03-10 DIAGNOSIS — M9901 Segmental and somatic dysfunction of cervical region: Secondary | ICD-10-CM | POA: Diagnosis not present

## 2022-03-11 DIAGNOSIS — M9903 Segmental and somatic dysfunction of lumbar region: Secondary | ICD-10-CM | POA: Diagnosis not present

## 2022-03-11 DIAGNOSIS — M9901 Segmental and somatic dysfunction of cervical region: Secondary | ICD-10-CM | POA: Diagnosis not present

## 2022-03-11 DIAGNOSIS — M9902 Segmental and somatic dysfunction of thoracic region: Secondary | ICD-10-CM | POA: Diagnosis not present

## 2022-03-11 DIAGNOSIS — M62830 Muscle spasm of back: Secondary | ICD-10-CM | POA: Diagnosis not present

## 2022-03-15 DIAGNOSIS — M62830 Muscle spasm of back: Secondary | ICD-10-CM | POA: Diagnosis not present

## 2022-03-15 DIAGNOSIS — M9903 Segmental and somatic dysfunction of lumbar region: Secondary | ICD-10-CM | POA: Diagnosis not present

## 2022-03-15 DIAGNOSIS — M9902 Segmental and somatic dysfunction of thoracic region: Secondary | ICD-10-CM | POA: Diagnosis not present

## 2022-03-15 DIAGNOSIS — M9901 Segmental and somatic dysfunction of cervical region: Secondary | ICD-10-CM | POA: Diagnosis not present

## 2022-03-17 DIAGNOSIS — M9902 Segmental and somatic dysfunction of thoracic region: Secondary | ICD-10-CM | POA: Diagnosis not present

## 2022-03-17 DIAGNOSIS — M9903 Segmental and somatic dysfunction of lumbar region: Secondary | ICD-10-CM | POA: Diagnosis not present

## 2022-03-17 DIAGNOSIS — M62830 Muscle spasm of back: Secondary | ICD-10-CM | POA: Diagnosis not present

## 2022-03-17 DIAGNOSIS — M9901 Segmental and somatic dysfunction of cervical region: Secondary | ICD-10-CM | POA: Diagnosis not present

## 2022-03-19 DIAGNOSIS — Z79899 Other long term (current) drug therapy: Secondary | ICD-10-CM | POA: Diagnosis not present

## 2022-03-19 DIAGNOSIS — H6690 Otitis media, unspecified, unspecified ear: Secondary | ICD-10-CM | POA: Diagnosis not present

## 2022-03-19 DIAGNOSIS — Z299 Encounter for prophylactic measures, unspecified: Secondary | ICD-10-CM | POA: Diagnosis not present

## 2022-03-19 DIAGNOSIS — M542 Cervicalgia: Secondary | ICD-10-CM | POA: Diagnosis not present

## 2022-03-19 DIAGNOSIS — I10 Essential (primary) hypertension: Secondary | ICD-10-CM | POA: Diagnosis not present

## 2022-03-19 DIAGNOSIS — G8929 Other chronic pain: Secondary | ICD-10-CM | POA: Diagnosis not present

## 2022-03-22 DIAGNOSIS — M9902 Segmental and somatic dysfunction of thoracic region: Secondary | ICD-10-CM | POA: Diagnosis not present

## 2022-03-22 DIAGNOSIS — M9901 Segmental and somatic dysfunction of cervical region: Secondary | ICD-10-CM | POA: Diagnosis not present

## 2022-03-22 DIAGNOSIS — M62830 Muscle spasm of back: Secondary | ICD-10-CM | POA: Diagnosis not present

## 2022-03-22 DIAGNOSIS — M9903 Segmental and somatic dysfunction of lumbar region: Secondary | ICD-10-CM | POA: Diagnosis not present

## 2022-03-24 DIAGNOSIS — M9901 Segmental and somatic dysfunction of cervical region: Secondary | ICD-10-CM | POA: Diagnosis not present

## 2022-03-24 DIAGNOSIS — M62830 Muscle spasm of back: Secondary | ICD-10-CM | POA: Diagnosis not present

## 2022-03-24 DIAGNOSIS — M9903 Segmental and somatic dysfunction of lumbar region: Secondary | ICD-10-CM | POA: Diagnosis not present

## 2022-03-24 DIAGNOSIS — M9902 Segmental and somatic dysfunction of thoracic region: Secondary | ICD-10-CM | POA: Diagnosis not present

## 2022-03-29 DIAGNOSIS — Z79899 Other long term (current) drug therapy: Secondary | ICD-10-CM | POA: Diagnosis not present

## 2022-03-29 DIAGNOSIS — Z299 Encounter for prophylactic measures, unspecified: Secondary | ICD-10-CM | POA: Diagnosis not present

## 2022-03-29 DIAGNOSIS — R5383 Other fatigue: Secondary | ICD-10-CM | POA: Diagnosis not present

## 2022-03-29 DIAGNOSIS — M9901 Segmental and somatic dysfunction of cervical region: Secondary | ICD-10-CM | POA: Diagnosis not present

## 2022-03-29 DIAGNOSIS — M9903 Segmental and somatic dysfunction of lumbar region: Secondary | ICD-10-CM | POA: Diagnosis not present

## 2022-03-29 DIAGNOSIS — M62830 Muscle spasm of back: Secondary | ICD-10-CM | POA: Diagnosis not present

## 2022-03-29 DIAGNOSIS — Z0000 Encounter for general adult medical examination without abnormal findings: Secondary | ICD-10-CM | POA: Diagnosis not present

## 2022-03-29 DIAGNOSIS — Z7189 Other specified counseling: Secondary | ICD-10-CM | POA: Diagnosis not present

## 2022-03-29 DIAGNOSIS — M9902 Segmental and somatic dysfunction of thoracic region: Secondary | ICD-10-CM | POA: Diagnosis not present

## 2022-03-29 DIAGNOSIS — E7800 Pure hypercholesterolemia, unspecified: Secondary | ICD-10-CM | POA: Diagnosis not present

## 2022-03-29 DIAGNOSIS — I10 Essential (primary) hypertension: Secondary | ICD-10-CM | POA: Diagnosis not present

## 2022-04-01 DIAGNOSIS — M9902 Segmental and somatic dysfunction of thoracic region: Secondary | ICD-10-CM | POA: Diagnosis not present

## 2022-04-01 DIAGNOSIS — M9903 Segmental and somatic dysfunction of lumbar region: Secondary | ICD-10-CM | POA: Diagnosis not present

## 2022-04-01 DIAGNOSIS — M62830 Muscle spasm of back: Secondary | ICD-10-CM | POA: Diagnosis not present

## 2022-04-01 DIAGNOSIS — I10 Essential (primary) hypertension: Secondary | ICD-10-CM | POA: Diagnosis not present

## 2022-04-01 DIAGNOSIS — M9901 Segmental and somatic dysfunction of cervical region: Secondary | ICD-10-CM | POA: Diagnosis not present

## 2022-04-05 ENCOUNTER — Ambulatory Visit (INDEPENDENT_AMBULATORY_CARE_PROVIDER_SITE_OTHER): Payer: Medicare Other

## 2022-04-05 DIAGNOSIS — Z95828 Presence of other vascular implants and grafts: Secondary | ICD-10-CM | POA: Diagnosis not present

## 2022-04-05 DIAGNOSIS — I739 Peripheral vascular disease, unspecified: Secondary | ICD-10-CM

## 2022-04-06 DIAGNOSIS — E1165 Type 2 diabetes mellitus with hyperglycemia: Secondary | ICD-10-CM | POA: Diagnosis not present

## 2022-04-06 DIAGNOSIS — M179 Osteoarthritis of knee, unspecified: Secondary | ICD-10-CM | POA: Diagnosis not present

## 2022-04-06 DIAGNOSIS — M545 Low back pain, unspecified: Secondary | ICD-10-CM | POA: Diagnosis not present

## 2022-04-06 DIAGNOSIS — J449 Chronic obstructive pulmonary disease, unspecified: Secondary | ICD-10-CM | POA: Diagnosis not present

## 2022-04-07 DIAGNOSIS — M62830 Muscle spasm of back: Secondary | ICD-10-CM | POA: Diagnosis not present

## 2022-04-07 DIAGNOSIS — M9903 Segmental and somatic dysfunction of lumbar region: Secondary | ICD-10-CM | POA: Diagnosis not present

## 2022-04-07 DIAGNOSIS — M9902 Segmental and somatic dysfunction of thoracic region: Secondary | ICD-10-CM | POA: Diagnosis not present

## 2022-04-07 DIAGNOSIS — M9901 Segmental and somatic dysfunction of cervical region: Secondary | ICD-10-CM | POA: Diagnosis not present

## 2022-04-08 DIAGNOSIS — M9903 Segmental and somatic dysfunction of lumbar region: Secondary | ICD-10-CM | POA: Diagnosis not present

## 2022-04-08 DIAGNOSIS — M9901 Segmental and somatic dysfunction of cervical region: Secondary | ICD-10-CM | POA: Diagnosis not present

## 2022-04-08 DIAGNOSIS — M62830 Muscle spasm of back: Secondary | ICD-10-CM | POA: Diagnosis not present

## 2022-04-08 DIAGNOSIS — M9902 Segmental and somatic dysfunction of thoracic region: Secondary | ICD-10-CM | POA: Diagnosis not present

## 2022-04-09 ENCOUNTER — Other Ambulatory Visit: Payer: Self-pay | Admitting: Cardiology

## 2022-04-14 DIAGNOSIS — M9901 Segmental and somatic dysfunction of cervical region: Secondary | ICD-10-CM | POA: Diagnosis not present

## 2022-04-14 DIAGNOSIS — M62830 Muscle spasm of back: Secondary | ICD-10-CM | POA: Diagnosis not present

## 2022-04-14 DIAGNOSIS — M9902 Segmental and somatic dysfunction of thoracic region: Secondary | ICD-10-CM | POA: Diagnosis not present

## 2022-04-14 DIAGNOSIS — M9903 Segmental and somatic dysfunction of lumbar region: Secondary | ICD-10-CM | POA: Diagnosis not present

## 2022-04-16 DIAGNOSIS — G8929 Other chronic pain: Secondary | ICD-10-CM | POA: Diagnosis not present

## 2022-04-16 DIAGNOSIS — Z789 Other specified health status: Secondary | ICD-10-CM | POA: Diagnosis not present

## 2022-04-16 DIAGNOSIS — M542 Cervicalgia: Secondary | ICD-10-CM | POA: Diagnosis not present

## 2022-04-16 DIAGNOSIS — Z299 Encounter for prophylactic measures, unspecified: Secondary | ICD-10-CM | POA: Diagnosis not present

## 2022-04-16 DIAGNOSIS — E1140 Type 2 diabetes mellitus with diabetic neuropathy, unspecified: Secondary | ICD-10-CM | POA: Diagnosis not present

## 2022-04-16 DIAGNOSIS — I10 Essential (primary) hypertension: Secondary | ICD-10-CM | POA: Diagnosis not present

## 2022-04-16 DIAGNOSIS — E1129 Type 2 diabetes mellitus with other diabetic kidney complication: Secondary | ICD-10-CM | POA: Diagnosis not present

## 2022-04-20 ENCOUNTER — Encounter: Payer: Self-pay | Admitting: *Deleted

## 2022-05-03 DIAGNOSIS — I10 Essential (primary) hypertension: Secondary | ICD-10-CM | POA: Diagnosis not present

## 2022-05-07 DIAGNOSIS — M545 Low back pain, unspecified: Secondary | ICD-10-CM | POA: Diagnosis not present

## 2022-05-07 DIAGNOSIS — J449 Chronic obstructive pulmonary disease, unspecified: Secondary | ICD-10-CM | POA: Diagnosis not present

## 2022-05-07 DIAGNOSIS — M179 Osteoarthritis of knee, unspecified: Secondary | ICD-10-CM | POA: Diagnosis not present

## 2022-05-07 DIAGNOSIS — E1165 Type 2 diabetes mellitus with hyperglycemia: Secondary | ICD-10-CM | POA: Diagnosis not present

## 2022-05-14 DIAGNOSIS — L408 Other psoriasis: Secondary | ICD-10-CM | POA: Diagnosis not present

## 2022-05-14 DIAGNOSIS — I10 Essential (primary) hypertension: Secondary | ICD-10-CM | POA: Diagnosis not present

## 2022-05-14 DIAGNOSIS — Z299 Encounter for prophylactic measures, unspecified: Secondary | ICD-10-CM | POA: Diagnosis not present

## 2022-05-14 DIAGNOSIS — G8929 Other chronic pain: Secondary | ICD-10-CM | POA: Diagnosis not present

## 2022-05-14 DIAGNOSIS — M542 Cervicalgia: Secondary | ICD-10-CM | POA: Diagnosis not present

## 2022-05-14 DIAGNOSIS — E1165 Type 2 diabetes mellitus with hyperglycemia: Secondary | ICD-10-CM | POA: Diagnosis not present

## 2022-05-17 ENCOUNTER — Other Ambulatory Visit: Payer: Self-pay | Admitting: Cardiology

## 2022-06-01 ENCOUNTER — Ambulatory Visit: Payer: Medicare Other | Admitting: Cardiology

## 2022-06-01 NOTE — Progress Notes (Deleted)
Clinical Summary [name] [name] is a 81 y.o.female  seen today for follow up of the following medical problems.    1. CAD - prior PTCA to distal LAD in 2001. 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   - echo 06/2012 LVEF 60-65% - DSE 06/2012 with reported ST elevation in inferior leads, negative echo images for ischemia.     - 02/2018 coronary CTA suggests severe RCA disease, abnormal FFR in distal LAD and LCX as well.  - 03/2018 cath as reported below. Received DES x3  to RCA. 75% OM1 small vessel managed medically     - - no recent chest pains. - no SOB/DOE - compliant with meds     2. HTN - history of difficult to treat HTN, primarily due to reported medication side effects to several agents.  - history of angioedema on ACE-I and most recently on ARB.   - she reports amlodopine caused leg swelling (she had been on 35m).  Clonidine caused dizziness.  - we had started chlrothalidone 552mdaily. She had some muscle cramping, and the dose was decreased to 2539maily, then 14m14md and then 12.5mg 57m.  - dizziness after starting hydralazine, pins and needles feelings in ears with laying down. We have tried continuing this medication. Same side effect reported on nitrates, discontinued.  - she reports alopecia possibly due to aldactone       - home bp's daily are 110s/60s. We actually had to cut back some of her bp meds in the past due to some low bp's at home.  - compliant with meds       3. PAD - prior iliac stent in July 2001 - [place] 9/[place]13 Right ABI 0.99 Left ABI 0.99, with patent left iliac stent.  11/2020 ABI:rNHA:FBXUX Left 0.87      = followed by Dr EarlyDonnetta Hutching vascular - recs for ongoing monitor, f/u as needed, does not have life limiting claudication       4. Hyperlipidemia - intolerant to statins   - she reports side effects on pcsk9 inhibitor, caused signicant palpitations  =- recent labs with pcp      5. CVA - admission 01/2020 with CVA, MRI showed acute right frontal  - neuro recommended contuing DAPT x 4 weeks, then plavix only. Completed ASA.      Past Medical History:  Diagnosis Date   Anxiety    Arthritis    "neck" (03/15/2018)   CAD (coronary artery disease)    Status post POBA distal LAD, status post Cardiolite Myoview 2008 negative for ischemia   COPD (chronic obstructive pulmonary disease) (HCC)    Depression    GERD (gastroesophageal reflux disease)    Hypercholesterolemia    Hypertension    Peripheral vascular disease (HCC) [place] status post iliac stent July 2001    Type II diabetes mellitus (HCC)      Allergies  Allergen Reactions   Alendronate     Other reaction(s): Other (See Comments) Leg edema   Clonidine Derivatives Other (See Comments)    dizziness   Crestor [Rosuvastatin]     Muscle cramps   Lisinopril Other (See Comments)    Tongue swelling   Losartan Other (See Comments)    TONGUE SWELLING   Codeine Nausea And Vomiting   Hydrocodone Nausea And Vomiting     Current Outpatient Medications  Medication Sig Dispense Refill   amLODipine (NORVASC) 5  MG tablet TAKE 1 TABLET BY MOUTH ONCE DAILY. 30 tablet 3   bimatoprost (LUMIGAN) 0.01 % SOLN Place 1 drop into both eyes at bedtime.     clopidogrel (PLAVIX) 75 MG tablet TAKE 1 TABLET ONCE DAILY. 30 tablet 3   ezetimibe (ZETIA) 10 MG tablet TAKE 1 TABLET ONCE DAILY. 30 tablet 6   hydrALAZINE (APRESOLINE) 25 MG tablet Take 0.5 tablets (12.5 mg total) by mouth 2 (two) times daily. 90 tablet 3   icosapent Ethyl (VASCEPA) 1 g capsule TAKE 1 CAPSULE BY MOUTH TWICE DAILY. 60 capsule 5   insulin glargine (LANTUS) 100 UNIT/ML injection Inject 12-50 Units into the skin daily. Inject 12 units in the morning and 50 in the evening     insulin lispro (HUMALOG) 100 UNIT/ML injection Inject 16 Units into the skin 3 (three) times daily with meals.      JARDIANCE 25 MG TABS tablet Take 25 mg by mouth daily.     labetalol  (NORMODYNE) 200 MG tablet TAKE (1) TABLET TWICE DAILY. 60 tablet 6   levocetirizine (XYZAL) 5 MG tablet Take 5 mg by mouth daily.     nitroGLYCERIN (NITROSTAT) 0.4 MG SL tablet Place 1 tablet (0.4 mg total) under the tongue every 5 (five) minutes as needed for chest pain. (Patient taking differently: Place 0.4 mg under the tongue every 5 (five) minutes x 3 doses as needed for chest pain.) 25 tablet 12   oxyCODONE-acetaminophen (PERCOCET) 10-325 MG tablet Take 1 tablet by mouth 2 (two) times daily.     Semaglutide (OZEMPIC, 0.25 OR 0.5 MG/DOSE, [place]) Inject into the skin once a week.     No current facility-administered medications for this visit.     Past Surgical History:  Procedure Laterality Date   ANTERIOR CERVICAL DECOMP/DISCECTOMY FUSION  2001   BACK SURGERY     BREAST SURGERY     biopsy left breat-benign   CATARACT EXTRACTION, BILATERAL Bilateral    CHOLECYSTECTOMY OPEN     CORONARY ANGIOPLASTY     POBA distal LAD,   CORONARY STENT INTERVENTION N/A 03/15/2018   Procedure: CORONARY STENT INTERVENTION;  Surgeon: Jettie Booze, MD;  Location: [facility];  Service: Cardiovascular;  Laterality: N/A;   DILATION AND CURETTAGE OF UTERUS  1960s   EYE SURGERY Bilateral    "lasers"   LEFT HEART CATH AND CORONARY ANGIOGRAPHY N/A 03/15/2018   Procedure: LEFT HEART CATH AND CORONARY ANGIOGRAPHY;  Surgeon: Jettie Booze, MD;  Location: [facility];  Service: Cardiovascular;  Laterality: N/A;   PERIPHERAL VASCULAR INTERVENTION Right 04/2000    iliac stent    TUBAL LIGATION       Allergies  Allergen Reactions   Alendronate     Other reaction(s): Other (See Comments) Leg edema   Clonidine Derivatives Other (See Comments)    dizziness   Crestor [Rosuvastatin]     Muscle cramps   Lisinopril Other (See Comments)    Tongue swelling   Losartan Other (See Comments)    TONGUE SWELLING   Codeine Nausea And Vomiting   Hydrocodone Nausea And Vomiting       Family History  Problem Relation Age of Onset   Heart failure Father    Heart failure Mother    Diabetes Mother    COPD Brother      Social History [name] [name] reports that she quit smoking about 26 years ago. Her smoking use included cigarettes. She started smoking about 60 years ago. She has  a 20.00 pack-year smoking history. She has never used smokeless tobacco. [name] [name] reports no history of alcohol use.   Review of Systems CONSTITUTIONAL: No weight loss, fever, chills, weakness or fatigue.  HEENT: Eyes: No visual loss, blurred vision, double vision or yellow sclerae.No hearing loss, sneezing, congestion, runny nose or sore throat.  SKIN: No rash or itching.  CARDIOVASCULAR:  RESPIRATORY: No shortness of breath, cough or sputum.  GASTROINTESTINAL: No anorexia, nausea, vomiting or diarrhea. No abdominal pain or blood.  GENITOURINARY: No burning on urination, no polyuria NEUROLOGICAL: No headache, dizziness, syncope, paralysis, ataxia, numbness or tingling in the extremities. No change in bowel or bladder control.  MUSCULOSKELETAL: No muscle, back pain, joint pain or stiffness.  LYMPHATICS: No enlarged nodes. No history of splenectomy.  PSYCHIATRIC: No history of depression or anxiety.  ENDOCRINOLOGIC: No reports of sweating, cold or heat intolerance. No polyuria or polydipsia.  [name]   Physical Examination There were no vitals filed for this visit. There were no vitals filed for this visit.  Gen: resting comfortably, no acute distress HEENT: no scleral icterus, pupils equal round and reactive, no palptable cervical adenopathy,  CV Resp: Clear to auscultation bilaterally GI: abdomen is soft, non-tender, non-distended, normal bowel sounds, no hepatosplenomegaly MSK: extremities are warm, no edema.  Skin: warm, no rash Neuro:  no focal deficits Psych: appropriate affect   Diagnostic Studies 11/1999 Cath HEMODYNAMIC DATA: Aortic pressure was initially extremely high  at 215/92. She   was   given intravenous enalaprilat, intravenous labetalol, and intravenous   nitroglycerin, ultimately bringing her systolic blood pressure down to less   than   160. Left ventricular pressure was 180/17, with a corresponding aortic   pressure of   180/80. There was no aortic valve gradient.   LEFT VENTRICULOGRAM: Wall motion was normal. Ejection fraction was estimated   t   65%. No mitral regurgitation.   ABDOMINAL AORTOGRAM: This revealed patent renal arteries and abdominal aorta.   The   left common iliac artery had a 75% stenosis. The right external iliac artery   had a   40% stenosis.   CORONARY ARTERIOGRAPHY (Right dominant):   1. The left main was normal.   2. The left anterior descending artery had a 25% stenosis in the proximal to   mid   vessel. The distal LAD had a 50% stenosis just after the bifurcation of the   large second diagonal Lindsey Hommel. Further down the distal LAD is a focal 80%   stenosis.   3. The left circumflex had a 20% stenosis in the mid vessel. It gave rise to a   small OM-1, a normal sized OM-2 which had a 25% stenosis, and a normal sized   OM-3 which had a 20% stenosis.   4. The right coronary artery had a diffuse 40% stenosis extending from the mid   o   distal vessel. There was a large posterior descending artery which had a   25%,   followed by a 40% stenosis. There were two small posterolateral branches.   IMPRESSIONS:   1. Normal left ventricular systolic function.   2. Peripheral vascular disease, as described.   3. One-vessel coronary artery disease with significant stenosis in the distal   left   anterior descending artery. This appears to correspond with her EKG changes   and   Cardiolite scan. She has moderate, but non-flow limiting disease in the   right coronary and left circumflex.   PLAN: Percutaneous intervention of the  distal LAD, see below.   PTCA PROCEDURAL NOTE: Following the completion of the diagnostic    catheterization,   we opted to proceed with percutaneous intervention. The preexisting 6-French   sheath in the right femoral artery was exchanged over a wire for a 7-French   sheath.   Integrilin and heparin were administered per protocol. We used a 7-French JL-4   guiding catheter and a BMW wire. The reference vessel was approximately 2 mm   in   diameter. We utilized a 2.0 x 20 mm CrossSail balloon, which was inflated   initially to 12 atm x 2 minutes and then 14 atm x 3 minutes. Final   angiographic   images revealed significant improvement in the lumen with 25% residual stenosis   and   a possible very small non-flow limiting dissection. There was TIMI-3 flow into   the   distal vessel.   COMPLICATIONS: None.   RESULTS: Successful percutaneous transluminal coronary angioplasty of the   distal   left anterior descending artery, reducing an 80% stenosis to 25% residual with   TIMI-3 flow.   PLAN: Integrilin will be continued for 20 hours. The patient needs aggressive   blood pressure control and risk factor reduction.   In regards to her peripheral vascular disease, she will be referred for further   evaluation and possible intervention.   03/2012 Echo LVEF 60-65%, no significant abnormalities     02/19/16 Clinic EKG (performed and reviewed in clinic): sinus bradycardia, no ischemic changes     02/2018 coronary CTA/FFR FINDINGS: FFR < 0.5 distal RCA suggesting severe mid RCA stenosis.   FFR 0.83 mid LAD suggesting non-hemodynamically significant mid LAD stenosis.   FFR 0.66 distal LAD suggesting significant distal LAD stenosis (versus cumulative effect of diffuse disease in LAD).   FFR 0.67 distal LCx suggesting significant stenosis mid to distal LCx (versus cumulative effect of diffuse disease in LCx).   IMPRESSION: 1.  Suspect severe mid RCA stenosis.   2. FFR significantly low in distal LAD and distal LCx. This could suggest a single hemodynamically significant  stenosis versus the cumulative effect of a long area of disease.   Suggest cardiac cath.   03/2018 cath Dist RCA lesion is 75% stenosed. Mid RCA lesion is 99% stenosed. Prox RCA to Mid RCA lesion is 60% stenosed. Drug-eluting stent was successfully placed: STENT SYNERGY DES 3.5X38., 4.0 x 28 mm and 4.0 x 12 mm. Post intervention, there is a 0% residual stenosis. Ost 1st Mrg lesion is 50% stenosed. 1st Mrg lesion is 75% stenosed. This was a small vessel. Mid Cx lesion is 60% stenosed. THis was a fairly small vessel. Mid LAD lesion is 60% stenosed. THis was not significant by CT FFR. Prox LAD to Mid LAD lesion is 25% stenosed. 2nd Diag lesion is 75% stenosed. The left ventricular systolic function is normal. LV end diastolic pressure is normal. There is no aortic valve stenosis.    Assessment and Plan   1., CAD - no symptoms - she is on plavix instead of aspirin per neuro for prior cva - she will continue current meds   2. HTN - bp's at goal, often run higher in clinic than homenumers   3. Hyperlipidemia - intolerant to statins. Reported side effects on pcsk9 inhibitor Continue zetia, request labs from pcp     [name], M.D., F.A.C.C.

## 2022-06-02 DIAGNOSIS — I10 Essential (primary) hypertension: Secondary | ICD-10-CM | POA: Diagnosis not present

## 2022-06-07 DIAGNOSIS — J449 Chronic obstructive pulmonary disease, unspecified: Secondary | ICD-10-CM | POA: Diagnosis not present

## 2022-06-07 DIAGNOSIS — M545 Low back pain, unspecified: Secondary | ICD-10-CM | POA: Diagnosis not present

## 2022-06-07 DIAGNOSIS — E1165 Type 2 diabetes mellitus with hyperglycemia: Secondary | ICD-10-CM | POA: Diagnosis not present

## 2022-06-07 DIAGNOSIS — M179 Osteoarthritis of knee, unspecified: Secondary | ICD-10-CM | POA: Diagnosis not present

## 2022-06-11 ENCOUNTER — Other Ambulatory Visit: Payer: Self-pay | Admitting: Cardiology

## 2022-06-18 ENCOUNTER — Other Ambulatory Visit: Payer: Self-pay | Admitting: Cardiology

## 2022-06-18 DIAGNOSIS — I10 Essential (primary) hypertension: Secondary | ICD-10-CM | POA: Diagnosis not present

## 2022-06-18 DIAGNOSIS — G8929 Other chronic pain: Secondary | ICD-10-CM | POA: Diagnosis not present

## 2022-06-18 DIAGNOSIS — R42 Dizziness and giddiness: Secondary | ICD-10-CM | POA: Diagnosis not present

## 2022-06-18 DIAGNOSIS — Z299 Encounter for prophylactic measures, unspecified: Secondary | ICD-10-CM | POA: Diagnosis not present

## 2022-06-18 DIAGNOSIS — M542 Cervicalgia: Secondary | ICD-10-CM | POA: Diagnosis not present

## 2022-06-18 DIAGNOSIS — E1140 Type 2 diabetes mellitus with diabetic neuropathy, unspecified: Secondary | ICD-10-CM | POA: Diagnosis not present

## 2022-06-18 DIAGNOSIS — Z79899 Other long term (current) drug therapy: Secondary | ICD-10-CM | POA: Diagnosis not present

## 2022-06-18 DIAGNOSIS — Z23 Encounter for immunization: Secondary | ICD-10-CM | POA: Diagnosis not present

## 2022-06-21 ENCOUNTER — Other Ambulatory Visit: Payer: Self-pay | Admitting: Cardiology

## 2022-06-21 DIAGNOSIS — I739 Peripheral vascular disease, unspecified: Secondary | ICD-10-CM

## 2022-06-28 ENCOUNTER — Encounter: Payer: Self-pay | Admitting: Cardiology

## 2022-06-28 ENCOUNTER — Ambulatory Visit: Payer: Medicare Other | Attending: Cardiology | Admitting: Cardiology

## 2022-06-28 ENCOUNTER — Encounter: Payer: Self-pay | Admitting: *Deleted

## 2022-06-28 VITALS — BP 160/70 | HR 68 | Ht 65.0 in | Wt 171.4 lb

## 2022-06-28 DIAGNOSIS — I739 Peripheral vascular disease, unspecified: Secondary | ICD-10-CM | POA: Diagnosis not present

## 2022-06-28 DIAGNOSIS — E782 Mixed hyperlipidemia: Secondary | ICD-10-CM

## 2022-06-28 DIAGNOSIS — I2510 Atherosclerotic heart disease of native coronary artery without angina pectoris: Secondary | ICD-10-CM

## 2022-06-28 DIAGNOSIS — M79606 Pain in leg, unspecified: Secondary | ICD-10-CM | POA: Diagnosis not present

## 2022-06-28 DIAGNOSIS — I10 Essential (primary) hypertension: Secondary | ICD-10-CM | POA: Diagnosis not present

## 2022-06-28 NOTE — Progress Notes (Signed)
Clinical Summary Ms. Schuessler is a 81 y.o.female  seen today for follow up of the following medical problems.    1. CAD - prior PTCA to distal LAD in 2001. 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   - echo 06/2012 LVEF 60-65% - DSE 06/2012 with reported ST elevation in inferior leads, negative echo images for ischemia.     - 02/2018 coronary CTA suggests severe RCA disease, abnormal FFR in distal LAD and LCX as well.  - 03/2018 cath as reported below. Received DES x3  to RCA. 75% OM1 small vessel managed medically     - no chest pains, no SOB/DOE     2. HTN - history of difficult to treat HTN, primarily due to reported medication side effects to several agents.  - history of angioedema on ACE-I and most recently on ARB.   - she reports amlodopine caused leg swelling (she had been on 34m).  Clonidine caused dizziness.  - we had started chlrothalidone 575mdaily. She had some muscle cramping, and the dose was decreased to 2572maily, then 18m17md and then 12.5mg 36m.  - dizziness after starting hydralazine, pins and needles feelings in ears with laying down. We have tried continuing this medication. Same side effect reported on nitrates, discontinued.  - she reports alopecia possibly due to aldactone     - home bp's 130s/60s     3. PAD - prior iliac stent in July 2001 - [place] 9/[place]13 Right ABI 0.99 Left ABI 0.99, with patent left iliac stent.  11/2020 ABI:[id number] Left 0.87      = followed by Dr EarlyDonnetta Hutching vascular - recs for ongoing monitor, f/u as needed, does not have life limiting claudication   - leg pains and weakness with walking just a few feet. - 04/2022 Right common and right external iliac artery have >50% stenosis. There is  >50% in-stent left common iliac artery stenosis - right ABI normal, left moderate     4. Hyperlipidemia - intolerant to statins   - she reports side effects on pcsk9 inhibitor,  caused signicant palpitations -reports recent labs with pcp     5. CVA - admission 01/2020 with CVA, MRI showed acute right frontal  - neuro recommended contuing DAPT x 4 weeks, then plavix only. Completed ASA.    Past Medical History:  Diagnosis Date   Anxiety    Arthritis    "neck" (03/15/2018)   CAD (coronary artery disease)    Status post POBA distal LAD, status post Cardiolite Myoview 2008 negative for ischemia   COPD (chronic obstructive pulmonary disease) (HCC)    Depression    GERD (gastroesophageal reflux disease)    Hypercholesterolemia    Hypertension    Peripheral vascular disease (HCC) [place] status post iliac stent July 2001    Type II diabetes mellitus (HCC)      Allergies  Allergen Reactions   Alendronate     Other reaction(s): Other (See Comments) Leg edema   Clonidine Derivatives Other (See Comments)    dizziness   Crestor [Rosuvastatin]     Muscle cramps   Lisinopril Other (See Comments)    Tongue swelling   Losartan Other (See Comments)    TONGUE SWELLING   Codeine Nausea And Vomiting   Hydrocodone Nausea And Vomiting     Current Outpatient Medications  Medication Sig Dispense Refill   amLODipine (NORVASC) 5 MG tablet TAKE  1 TABLET BY MOUTH ONCE DAILY. 30 tablet 3   bimatoprost (LUMIGAN) 0.01 % SOLN Place 1 drop into both eyes at bedtime.     clopidogrel (PLAVIX) 75 MG tablet TAKE 1 TABLET ONCE DAILY. 30 tablet 3   ezetimibe (ZETIA) 10 MG tablet TAKE 1 TABLET ONCE DAILY. 30 tablet 6   hydrALAZINE (APRESOLINE) 25 MG tablet TAKE (1/2) TABLET BY MOUTH 2 TIMES A DAY. 30 tablet 0   icosapent Ethyl (VASCEPA) 1 g capsule TAKE 1 CAPSULE BY MOUTH TWICE DAILY. 60 capsule 5   insulin glargine (LANTUS) 100 UNIT/ML injection Inject 12-50 Units into the skin daily. Inject 12 units in the morning and 50 in the evening     insulin lispro (HUMALOG) 100 UNIT/ML injection Inject 16 Units into the skin 3 (three) times daily with meals.      JARDIANCE 25 MG TABS tablet  Take 25 mg by mouth daily.     labetalol (NORMODYNE) 200 MG tablet TAKE (1) TABLET TWICE DAILY. 60 tablet 1   levocetirizine (XYZAL) 5 MG tablet Take 5 mg by mouth daily.     nitroGLYCERIN (NITROSTAT) 0.4 MG SL tablet Place 1 tablet (0.4 mg total) under the tongue every 5 (five) minutes as needed for chest pain. (Patient taking differently: Place 0.4 mg under the tongue every 5 (five) minutes x 3 doses as needed for chest pain.) 25 tablet 12   oxyCODONE-acetaminophen (PERCOCET) 10-325 MG tablet Take 1 tablet by mouth 2 (two) times daily.     Semaglutide (OZEMPIC, 0.25 OR 0.5 MG/DOSE, [place]) Inject into the skin once a week.     No current facility-administered medications for this visit.     Past Surgical History:  Procedure Laterality Date   ANTERIOR CERVICAL DECOMP/DISCECTOMY FUSION  2001   BACK SURGERY     BREAST SURGERY     biopsy left breat-benign   CATARACT EXTRACTION, BILATERAL Bilateral    CHOLECYSTECTOMY OPEN     CORONARY ANGIOPLASTY     POBA distal LAD,   CORONARY STENT INTERVENTION N/A 03/15/2018   Procedure: CORONARY STENT INTERVENTION;  Surgeon: Jettie Booze, MD;  Location: [facility];  Service: Cardiovascular;  Laterality: N/A;   DILATION AND CURETTAGE OF UTERUS  1960s   EYE SURGERY Bilateral    "lasers"   LEFT HEART CATH AND CORONARY ANGIOGRAPHY N/A 03/15/2018   Procedure: LEFT HEART CATH AND CORONARY ANGIOGRAPHY;  Surgeon: Jettie Booze, MD;  Location: [facility];  Service: Cardiovascular;  Laterality: N/A;   PERIPHERAL VASCULAR INTERVENTION Right 04/2000    iliac stent    TUBAL LIGATION       Allergies  Allergen Reactions   Alendronate     Other reaction(s): Other (See Comments) Leg edema   Clonidine Derivatives Other (See Comments)    dizziness   Crestor [Rosuvastatin]     Muscle cramps   Lisinopril Other (See Comments)    Tongue swelling   Losartan Other (See Comments)    TONGUE SWELLING   Codeine Nausea And Vomiting    Hydrocodone Nausea And Vomiting      Family History  Problem Relation Age of Onset   Heart failure Father    Heart failure Mother    Diabetes Mother    COPD Brother      Social History Ms. Romick reports that she quit smoking about 26 years ago. Her smoking use included cigarettes. She started smoking about 60 years ago. She has a 20.00 pack-year smoking history. She  has never used smokeless tobacco. Ms. Rizor reports no history of alcohol use.   Review of Systems CONSTITUTIONAL: No weight loss, fever, chills, weakness or fatigue.  HEENT: Eyes: No visual loss, blurred vision, double vision or yellow sclerae.No hearing loss, sneezing, congestion, runny nose or sore throat.  SKIN: No rash or itching.  CARDIOVASCULAR: per hpi RESPIRATORY: No shortness of breath, cough or sputum.  GASTROINTESTINAL: No anorexia, nausea, vomiting or diarrhea. No abdominal pain or blood.  GENITOURINARY: No burning on urination, no polyuria NEUROLOGICAL: No headache, dizziness, syncope, paralysis, ataxia, numbness or tingling in the extremities. No change in bowel or bladder control.  MUSCULOSKELETAL: per hpi LYMPHATICS: No enlarged nodes. No history of splenectomy.  PSYCHIATRIC: No history of depression or anxiety.  ENDOCRINOLOGIC: No reports of sweating, cold or heat intolerance. No polyuria or polydipsia.  [name]   Physical Examination Today's Vitals   06/28/22 1329  BP: (!) 164/70  Pulse: 68  SpO2: 96%  Weight: 171 lb 6.4 oz (77.7 kg)  Height: _0  (1.651 m)   Body mass index is 28.52 kg/m.  Gen: resting comfortably, no acute distress HEENT: no scleral icterus, pupils equal round and reactive, no palptable cervical adenopathy,  CV: RRR, no m/r/g, no jvd Resp: Clear to auscultation bilaterally GI: abdomen is soft, non-tender, non-distended, normal bowel sounds, no hepatosplenomegaly MSK: extremities are warm, no edema.  Skin: warm, no rash Neuro:  no focal deficits Psych: appropriate  affect   Diagnostic Studies 11/1999 Cath HEMODYNAMIC DATA: Aortic pressure was initially extremely high at 215/92. She   was   given intravenous enalaprilat, intravenous labetalol, and intravenous   nitroglycerin, ultimately bringing her systolic blood pressure down to less   than   160. Left ventricular pressure was 180/17, with a corresponding aortic   pressure of   180/80. There was no aortic valve gradient.   LEFT VENTRICULOGRAM: Wall motion was normal. Ejection fraction was estimated   t   65%. No mitral regurgitation.   ABDOMINAL AORTOGRAM: This revealed patent renal arteries and abdominal aorta.   The   left common iliac artery had a 75% stenosis. The right external iliac artery   had a   40% stenosis.   CORONARY ARTERIOGRAPHY (Right dominant):   1. The left main was normal.   2. The left anterior descending artery had a 25% stenosis in the proximal to   mid   vessel. The distal LAD had a 50% stenosis just after the bifurcation of the   large second diagonal Ramonte Mena. Further down the distal LAD is a focal 80%   stenosis.   3. The left circumflex had a 20% stenosis in the mid vessel. It gave rise to a   small OM-1, a normal sized OM-2 which had a 25% stenosis, and a normal sized   OM-3 which had a 20% stenosis.   4. The right coronary artery had a diffuse 40% stenosis extending from the mid   o   distal vessel. There was a large posterior descending artery which had a   25%,   followed by a 40% stenosis. There were two small posterolateral branches.   IMPRESSIONS:   1. Normal left ventricular systolic function.   2. Peripheral vascular disease, as described.   3. One-vessel coronary artery disease with significant stenosis in the distal   left   anterior descending artery. This appears to correspond with her EKG changes   and   Cardiolite scan. She has moderate, but non-flow limiting disease in  the   right coronary and left circumflex.   PLAN: Percutaneous  intervention of the distal LAD, see below.   PTCA PROCEDURAL NOTE: Following the completion of the diagnostic   catheterization,   we opted to proceed with percutaneous intervention. The preexisting 6-French   sheath in the right femoral artery was exchanged over a wire for a 7-French   sheath.   Integrilin and heparin were administered per protocol. We used a 7-French JL-4   guiding catheter and a BMW wire. The reference vessel was approximately 2 mm   in   diameter. We utilized a 2.0 x 20 mm CrossSail balloon, which was inflated   initially to 12 atm x 2 minutes and then 14 atm x 3 minutes. Final   angiographic   images revealed significant improvement in the lumen with 25% residual stenosis   and   a possible very small non-flow limiting dissection. There was TIMI-3 flow into   the   distal vessel.   COMPLICATIONS: None.   RESULTS: Successful percutaneous transluminal coronary angioplasty of the   distal   left anterior descending artery, reducing an 80% stenosis to 25% residual with   TIMI-3 flow.   PLAN: Integrilin will be continued for 20 hours. The patient needs aggressive   blood pressure control and risk factor reduction.   In regards to her peripheral vascular disease, she will be referred for further   evaluation and possible intervention.   03/2012 Echo LVEF 60-65%, no significant abnormalities     02/19/16 Clinic EKG (performed and reviewed in clinic): sinus bradycardia, no ischemic changes     02/2018 coronary CTA/FFR FINDINGS: FFR < 0.5 distal RCA suggesting severe mid RCA stenosis.   FFR 0.83 mid LAD suggesting non-hemodynamically significant mid LAD stenosis.   FFR 0.66 distal LAD suggesting significant distal LAD stenosis (versus cumulative effect of diffuse disease in LAD).   FFR 0.67 distal LCx suggesting significant stenosis mid to distal LCx (versus cumulative effect of diffuse disease in LCx).   IMPRESSION: 1.  Suspect severe mid RCA stenosis.    2. FFR significantly low in distal LAD and distal LCx. This could suggest a single hemodynamically significant stenosis versus the cumulative effect of a long area of disease.   Suggest cardiac cath.   03/2018 cath Dist RCA lesion is 75% stenosed. Mid RCA lesion is 99% stenosed. Prox RCA to Mid RCA lesion is 60% stenosed. Drug-eluting stent was successfully placed: STENT SYNERGY DES 3.5X38., 4.0 x 28 mm and 4.0 x 12 mm. Post intervention, there is a 0% residual stenosis. Ost 1st Mrg lesion is 50% stenosed. 1st Mrg lesion is 75% stenosed. This was a small vessel. Mid Cx lesion is 60% stenosed. THis was a fairly small vessel. Mid LAD lesion is 60% stenosed. THis was not significant by CT FFR. Prox LAD to Mid LAD lesion is 25% stenosed. 2nd Diag lesion is 75% stenosed. The left ventricular systolic function is normal. LV end diastolic pressure is normal. There is no aortic valve stenosis.      Assessment and Plan   1., CAD -no recent symptoms - she is on plavix instead of aspirin per neuro for prior cva - continue current meds - EKG today SR, no ischemic changes   2. HTN - typically elevated in clinic any at goal by home numbers which is same pattern today, continue current meds.    3. Hyperlipidemia - intolerant to statins. Reported side effects on pcsk9 inhibitor - request pcp labs, continue  zetia.   4.PAD - progressing symptoms, will have her f/u with Dr Donnetta Hutching.    [name], M.D.

## 2022-06-28 NOTE — Patient Instructions (Addendum)
Medication Instructions:  Your physician recommends that you continue on your current medications as directed. Please refer to the Current Medication list given to you today.  Labwork: none  Testing/Procedures: none  Follow-Up: Your physician recommends that you schedule a follow-up appointment in: 6 months  Any Other Special Instructions Will Be Listed Below (If Applicable). You have been referred to Dr. Don[facility]  If you need a refill on your cardiac medications before your next appointment, please call your pharmacy.

## 2022-07-02 DIAGNOSIS — I10 Essential (primary) hypertension: Secondary | ICD-10-CM | POA: Diagnosis not present

## 2022-07-03 DIAGNOSIS — E1165 Type 2 diabetes mellitus with hyperglycemia: Secondary | ICD-10-CM | POA: Diagnosis not present

## 2022-07-03 DIAGNOSIS — I10 Essential (primary) hypertension: Secondary | ICD-10-CM | POA: Diagnosis not present

## 2022-07-07 ENCOUNTER — Encounter: Payer: Self-pay | Admitting: Vascular Surgery

## 2022-07-07 ENCOUNTER — Ambulatory Visit (INDEPENDENT_AMBULATORY_CARE_PROVIDER_SITE_OTHER): Payer: Medicare Other | Admitting: Vascular Surgery

## 2022-07-07 VITALS — BP 175/72 | HR 64 | Temp 98.4°F | Ht 65.0 in | Wt 170.8 lb

## 2022-07-07 DIAGNOSIS — I70213 Atherosclerosis of native arteries of extremities with intermittent claudication, bilateral legs: Secondary | ICD-10-CM | POA: Diagnosis not present

## 2022-07-07 DIAGNOSIS — M179 Osteoarthritis of knee, unspecified: Secondary | ICD-10-CM | POA: Diagnosis not present

## 2022-07-07 DIAGNOSIS — E1165 Type 2 diabetes mellitus with hyperglycemia: Secondary | ICD-10-CM | POA: Diagnosis not present

## 2022-07-07 DIAGNOSIS — J449 Chronic obstructive pulmonary disease, unspecified: Secondary | ICD-10-CM | POA: Diagnosis not present

## 2022-07-07 DIAGNOSIS — M545 Low back pain, unspecified: Secondary | ICD-10-CM | POA: Diagnosis not present

## 2022-07-07 NOTE — Progress Notes (Signed)
[facility] [place]  Patient name: [name] [name] MRN: [id number] DOB: 1941-08-25 Sex: female  REASON FOR VISIT: Follow-up lower extremity arterial insufficiency and probable venous stasis ulcer  HPI: [name] [name] is a 81 y.o. female here today for follow-up.  I saw her initially in February 2022.  She did have a remote history of left common iliac artery stenting in 2001.  Duplex evaluation reveals bilateral iliac disease with an stent restenosis and also stenosis in her native common iliac on the right.  Recent ankle arm index is 0.98 on the right and 0.74 on the left.  She does report some ongoing bilateral lower extremity claudication which she notices mostly when she is walking at the gym.  Has no tissue loss she does have an area of ulceration over the distal right posterior calf.  This appears to be related to venous stasis disease.  She has no history of DVT  Past Medical History:  Diagnosis Date   Anxiety    Arthritis    "neck" (03/15/2018)   CAD (coronary artery disease)    Status post POBA distal LAD, status post Cardiolite Myoview 2008 negative for ischemia   COPD (chronic obstructive pulmonary disease) (HCC)    Depression    GERD (gastroesophageal reflux disease)    Hypercholesterolemia    Hypertension    Peripheral vascular disease ([place])     status post iliac stent July 2001    Type II diabetes mellitus ([place])     Family History  Problem Relation Age of Onset   Heart failure Father    Heart failure Mother    Diabetes Mother    COPD Brother     SOCIAL HISTORY: Social History   Tobacco Use   Smoking status: Former    Packs/day: 0.50    Years: 40.00    Total pack years: 20.00    Types: Cigarettes    Start date: 11/30/1961    Quit date: 10/05/1995    Years since quitting: 26.7   Smokeless tobacco: Never  Substance Use Topics   Alcohol use: Never    Alcohol/week: 0.0 standard drinks of alcohol     Allergies  Allergen Reactions   Alendronate     Other reaction(s): Other (See Comments) Leg edema   Clonidine Derivatives Other (See Comments)    dizziness   Crestor [Rosuvastatin]     Muscle cramps   Lisinopril Other (See Comments)    Tongue swelling   Losartan Other (See Comments)    TONGUE SWELLING   Codeine Nausea And Vomiting   Hydrocodone Nausea And Vomiting    Current Outpatient Medications  Medication Sig Dispense Refill   amLODipine (NORVASC) 5 MG tablet TAKE 1 TABLET BY MOUTH ONCE DAILY. 30 tablet 3   clopidogrel (PLAVIX) 75 MG tablet TAKE 1 TABLET ONCE DAILY. 30 tablet 3   ezetimibe (ZETIA) 10 MG tablet TAKE 1 TABLET ONCE DAILY. 30 tablet 6   fluticasone (FLONASE) 50 MCG/ACT nasal spray Place 2 sprays into both nostrils daily.     gabapentin (NEURONTIN) 100 MG capsule Take 100 mg by mouth daily.     HUMALOG KWIKPEN 200 UNIT/ML KwikPen Inject 16 Units into the skin with breakfast, with lunch, and with evening meal.     hydrALAZINE (APRESOLINE) 25 MG tablet TAKE (1/2) TABLET BY MOUTH 2 TIMES A DAY. 30 tablet 0   icosapent Ethyl (VASCEPA) 1 g capsule TAKE 1 CAPSULE BY MOUTH TWICE DAILY. 60 capsule 5  insulin glargine (LANTUS) 100 UNIT/ML injection Inject 16-50 Units into the skin daily. Inject 16 units in the morning and 50 in the evening     JARDIANCE 25 MG TABS tablet Take 25 mg by mouth daily.     labetalol (NORMODYNE) 200 MG tablet TAKE (1) TABLET TWICE DAILY. 60 tablet 1   meclizine (ANTIVERT) 25 MG tablet Take 25 mg by mouth 2 (two) times daily as needed.     nitroGLYCERIN (NITROSTAT) 0.4 MG SL tablet Place 1 tablet (0.4 mg total) under the tongue every 5 (five) minutes as needed for chest pain. (Patient taking differently: Place 0.4 mg under the tongue every 5 (five) minutes x 3 doses as needed for chest pain.) 25 tablet 12   oxyCODONE-acetaminophen (PERCOCET) 10-325 MG tablet Take 1 tablet by mouth 2 (two) times daily.     OZEMPIC, 0.25 OR 0.5 MG/DOSE, 2 MG/3ML  SOPN Inject 0.5 mg into the skin once a week.     timolol (TIMOPTIC) 0.5 % ophthalmic solution Place 1 drop into both eyes daily.     No current facility-administered medications for this visit.    REVIEW OF SYSTEMS:  '[X]'$  denotes positive finding, '[ ]'$  denotes negative finding Cardiac  Comments:  Chest pain or chest pressure:    Shortness of breath upon exertion:    Short of breath when lying flat:    Irregular heart rhythm:        Vascular    Pain in calf, thigh, or hip brought on by ambulation: x   Pain in feet at night that wakes you up from your sleep:     Blood clot in your veins:    Leg swelling:  x         PHYSICAL EXAM: Vitals:   07/07/22 0921  BP: (!) 175/72  Pulse: 64  Temp: 98.4 F (36.9 C)  Weight: 170 lb 12.8 oz (77.5 kg)  Height: '5\' 5"'$  (1.651 m)    GENERAL: The patient is a well-nourished female, in no acute distress. The vital signs are documented above. CARDIOVASCULAR: I did not palpate pedal pulses bilaterally. PULMONARY: There is good air exchange  MUSCULOSKELETAL: There are no major deformities or cyanosis. NEUROLOGIC: No focal weakness or paresthesias are detected. SKIN: She has thickening in the skin circumferentially from her ankle to mid calf bilaterally with some hemosiderin deposits.  She does have a small superficial ulceration over the posterior aspect of the right distal calf PSYCHIATRIC: The patient has a normal affect.  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  She does appear to have venous stasis ulceration.  I explained the importance of elevation and also compression.  She is using a topical ointment currently.  I explained the need to add compression to this as well.  We have redressed her today with ointment over the wound followed by Curlex and a Ace wrap beginning on the dorsum of her foot and extending to her calf.  Also have  suggested bilateral knee-high 20 to 30 mmHg compression garments.  Explained that we would allow for complete healing of her right ulceration prior to using the compression garments and then daily use of compression.  We will see her again on an as-needed basis    Rosetta Posner, MD FACS [facility] Tel [phone]  Note: Portions of this report may have been  transcribed using voice recognition software.  Every effort has been made to ensure accuracy; however, inadvertent computerized transcription errors may still be present.

## 2022-07-12 ENCOUNTER — Other Ambulatory Visit: Payer: Self-pay | Admitting: Cardiology

## 2022-07-15 ENCOUNTER — Telehealth: Payer: Self-pay

## 2022-07-15 ENCOUNTER — Telehealth: Payer: Self-pay | Admitting: Cardiology

## 2022-07-15 MED ORDER — HYDRALAZINE HCL 25 MG PO TABS: 12.5000 mg | ORAL_TABLET | Freq: Two times a day (BID) | ORAL | 5 refills | Status: DC

## 2022-07-15 MED ORDER — CLOPIDOGREL BISULFATE 75 MG PO TABS: 75.0000 mg | ORAL_TABLET | Freq: Every day | ORAL | 5 refills | Status: DC

## 2022-07-15 NOTE — Telephone Encounter (Signed)
*  STAT* If patient is at the pharmacy, call can be transferred to refill team.   1. Which medications need to be refilled? (please list name of each medication and dose if known)  hydrALAZINE (APRESOLINE) 25 MG tablet clopidogrel (PLAVIX) 75 MG tablet  2. Which pharmacy/location (including street and city if local pharmacy) is medication to be sent to? [place], [place]  3. Do they need a 30 day or 90 day supply? 30 day  Patient is completely out of medication

## 2022-07-15 NOTE — Telephone Encounter (Signed)
Pt called stating that she needed advice for support hose for leg swelling.  Reviewed pt's chart, returned call for clarification, two identifiers used. Pt stated that she had gone to [employer] and purchased compression stockings after being fitted. She has apparently been wearing them over the top of her leg dressing. Informed pt that she was not supposed to start wearing those until her ulceration was healed. Pt to continue changing the dressing on her leg as instructed and wait to wear compressions. Confirmed understanding.

## 2022-07-19 DIAGNOSIS — M542 Cervicalgia: Secondary | ICD-10-CM | POA: Diagnosis not present

## 2022-07-19 DIAGNOSIS — Z299 Encounter for prophylactic measures, unspecified: Secondary | ICD-10-CM | POA: Diagnosis not present

## 2022-07-19 DIAGNOSIS — I10 Essential (primary) hypertension: Secondary | ICD-10-CM | POA: Diagnosis not present

## 2022-07-19 DIAGNOSIS — G8929 Other chronic pain: Secondary | ICD-10-CM | POA: Diagnosis not present

## 2022-08-02 DIAGNOSIS — I10 Essential (primary) hypertension: Secondary | ICD-10-CM | POA: Diagnosis not present

## 2022-08-07 DIAGNOSIS — M179 Osteoarthritis of knee, unspecified: Secondary | ICD-10-CM | POA: Diagnosis not present

## 2022-08-07 DIAGNOSIS — J449 Chronic obstructive pulmonary disease, unspecified: Secondary | ICD-10-CM | POA: Diagnosis not present

## 2022-08-07 DIAGNOSIS — M545 Low back pain, unspecified: Secondary | ICD-10-CM | POA: Diagnosis not present

## 2022-08-07 DIAGNOSIS — E1165 Type 2 diabetes mellitus with hyperglycemia: Secondary | ICD-10-CM | POA: Diagnosis not present

## 2022-08-18 DIAGNOSIS — M542 Cervicalgia: Secondary | ICD-10-CM | POA: Diagnosis not present

## 2022-08-18 DIAGNOSIS — E11622 Type 2 diabetes mellitus with other skin ulcer: Secondary | ICD-10-CM | POA: Diagnosis not present

## 2022-08-18 DIAGNOSIS — Z299 Encounter for prophylactic measures, unspecified: Secondary | ICD-10-CM | POA: Diagnosis not present

## 2022-08-18 DIAGNOSIS — I10 Essential (primary) hypertension: Secondary | ICD-10-CM | POA: Diagnosis not present

## 2022-08-18 DIAGNOSIS — G8929 Other chronic pain: Secondary | ICD-10-CM | POA: Diagnosis not present

## 2022-08-18 DIAGNOSIS — E1165 Type 2 diabetes mellitus with hyperglycemia: Secondary | ICD-10-CM | POA: Diagnosis not present

## 2022-08-30 ENCOUNTER — Other Ambulatory Visit: Payer: Self-pay | Admitting: Cardiology

## 2022-09-01 DIAGNOSIS — I10 Essential (primary) hypertension: Secondary | ICD-10-CM | POA: Diagnosis not present

## 2022-09-06 DIAGNOSIS — E1165 Type 2 diabetes mellitus with hyperglycemia: Secondary | ICD-10-CM | POA: Diagnosis not present

## 2022-09-06 DIAGNOSIS — M545 Low back pain, unspecified: Secondary | ICD-10-CM | POA: Diagnosis not present

## 2022-09-06 DIAGNOSIS — J449 Chronic obstructive pulmonary disease, unspecified: Secondary | ICD-10-CM | POA: Diagnosis not present

## 2022-09-06 DIAGNOSIS — M179 Osteoarthritis of knee, unspecified: Secondary | ICD-10-CM | POA: Diagnosis not present

## 2022-09-14 ENCOUNTER — Other Ambulatory Visit: Payer: Self-pay | Admitting: Cardiology

## 2022-09-17 DIAGNOSIS — L409 Psoriasis, unspecified: Secondary | ICD-10-CM | POA: Diagnosis not present

## 2022-09-17 DIAGNOSIS — M542 Cervicalgia: Secondary | ICD-10-CM | POA: Diagnosis not present

## 2022-09-17 DIAGNOSIS — Z299 Encounter for prophylactic measures, unspecified: Secondary | ICD-10-CM | POA: Diagnosis not present

## 2022-09-17 DIAGNOSIS — G8929 Other chronic pain: Secondary | ICD-10-CM | POA: Diagnosis not present

## 2022-09-17 DIAGNOSIS — I10 Essential (primary) hypertension: Secondary | ICD-10-CM | POA: Diagnosis not present

## 2022-10-01 DIAGNOSIS — I10 Essential (primary) hypertension: Secondary | ICD-10-CM | POA: Diagnosis not present

## 2022-10-07 DIAGNOSIS — M545 Low back pain, unspecified: Secondary | ICD-10-CM | POA: Diagnosis not present

## 2022-10-07 DIAGNOSIS — J449 Chronic obstructive pulmonary disease, unspecified: Secondary | ICD-10-CM | POA: Diagnosis not present

## 2022-10-07 DIAGNOSIS — E1165 Type 2 diabetes mellitus with hyperglycemia: Secondary | ICD-10-CM | POA: Diagnosis not present

## 2022-10-07 DIAGNOSIS — M179 Osteoarthritis of knee, unspecified: Secondary | ICD-10-CM | POA: Diagnosis not present

## 2022-10-13 ENCOUNTER — Ambulatory Visit: Payer: Medicare Other | Admitting: Internal Medicine

## 2022-10-18 DIAGNOSIS — I10 Essential (primary) hypertension: Secondary | ICD-10-CM | POA: Diagnosis not present

## 2022-10-18 DIAGNOSIS — G8929 Other chronic pain: Secondary | ICD-10-CM | POA: Diagnosis not present

## 2022-10-18 DIAGNOSIS — M542 Cervicalgia: Secondary | ICD-10-CM | POA: Diagnosis not present

## 2022-10-18 DIAGNOSIS — Z299 Encounter for prophylactic measures, unspecified: Secondary | ICD-10-CM | POA: Diagnosis not present

## 2022-10-18 DIAGNOSIS — E1165 Type 2 diabetes mellitus with hyperglycemia: Secondary | ICD-10-CM | POA: Diagnosis not present

## 2022-10-21 DIAGNOSIS — L218 Other seborrheic dermatitis: Secondary | ICD-10-CM | POA: Diagnosis not present

## 2022-10-26 DIAGNOSIS — I10 Essential (primary) hypertension: Secondary | ICD-10-CM | POA: Diagnosis not present

## 2022-10-26 DIAGNOSIS — Z79899 Other long term (current) drug therapy: Secondary | ICD-10-CM | POA: Diagnosis not present

## 2022-10-26 DIAGNOSIS — E785 Hyperlipidemia, unspecified: Secondary | ICD-10-CM | POA: Diagnosis not present

## 2022-10-26 DIAGNOSIS — L97918 Non-pressure chronic ulcer of unspecified part of right lower leg with other specified severity: Secondary | ICD-10-CM | POA: Diagnosis not present

## 2022-10-26 DIAGNOSIS — E11622 Type 2 diabetes mellitus with other skin ulcer: Secondary | ICD-10-CM | POA: Diagnosis not present

## 2022-10-26 DIAGNOSIS — T148XXA Other injury of unspecified body region, initial encounter: Secondary | ICD-10-CM | POA: Diagnosis not present

## 2022-10-26 DIAGNOSIS — Z794 Long term (current) use of insulin: Secondary | ICD-10-CM | POA: Diagnosis not present

## 2022-10-26 DIAGNOSIS — S91001A Unspecified open wound, right ankle, initial encounter: Secondary | ICD-10-CM | POA: Diagnosis not present

## 2022-10-26 DIAGNOSIS — L97919 Non-pressure chronic ulcer of unspecified part of right lower leg with unspecified severity: Secondary | ICD-10-CM | POA: Diagnosis not present

## 2022-10-26 DIAGNOSIS — Z7902 Long term (current) use of antithrombotics/antiplatelets: Secondary | ICD-10-CM | POA: Diagnosis not present

## 2022-11-01 DIAGNOSIS — I10 Essential (primary) hypertension: Secondary | ICD-10-CM | POA: Diagnosis not present

## 2022-11-02 DIAGNOSIS — S91001A Unspecified open wound, right ankle, initial encounter: Secondary | ICD-10-CM | POA: Diagnosis not present

## 2022-11-02 DIAGNOSIS — I10 Essential (primary) hypertension: Secondary | ICD-10-CM | POA: Diagnosis not present

## 2022-11-02 DIAGNOSIS — L97918 Non-pressure chronic ulcer of unspecified part of right lower leg with other specified severity: Secondary | ICD-10-CM | POA: Diagnosis not present

## 2022-11-02 DIAGNOSIS — E785 Hyperlipidemia, unspecified: Secondary | ICD-10-CM | POA: Diagnosis not present

## 2022-11-02 DIAGNOSIS — T148XXA Other injury of unspecified body region, initial encounter: Secondary | ICD-10-CM | POA: Diagnosis not present

## 2022-11-02 DIAGNOSIS — Z794 Long term (current) use of insulin: Secondary | ICD-10-CM | POA: Diagnosis not present

## 2022-11-02 DIAGNOSIS — L97919 Non-pressure chronic ulcer of unspecified part of right lower leg with unspecified severity: Secondary | ICD-10-CM | POA: Diagnosis not present

## 2022-11-02 DIAGNOSIS — Z79899 Other long term (current) drug therapy: Secondary | ICD-10-CM | POA: Diagnosis not present

## 2022-11-02 DIAGNOSIS — E11622 Type 2 diabetes mellitus with other skin ulcer: Secondary | ICD-10-CM | POA: Diagnosis not present

## 2022-11-02 DIAGNOSIS — Z7902 Long term (current) use of antithrombotics/antiplatelets: Secondary | ICD-10-CM | POA: Diagnosis not present

## 2022-11-07 DIAGNOSIS — J449 Chronic obstructive pulmonary disease, unspecified: Secondary | ICD-10-CM | POA: Diagnosis not present

## 2022-11-07 DIAGNOSIS — E1165 Type 2 diabetes mellitus with hyperglycemia: Secondary | ICD-10-CM | POA: Diagnosis not present

## 2022-11-07 DIAGNOSIS — M179 Osteoarthritis of knee, unspecified: Secondary | ICD-10-CM | POA: Diagnosis not present

## 2022-11-07 DIAGNOSIS — M545 Low back pain, unspecified: Secondary | ICD-10-CM | POA: Diagnosis not present

## 2022-11-08 DIAGNOSIS — T148XXA Other injury of unspecified body region, initial encounter: Secondary | ICD-10-CM | POA: Diagnosis not present

## 2022-11-08 DIAGNOSIS — L97918 Non-pressure chronic ulcer of unspecified part of right lower leg with other specified severity: Secondary | ICD-10-CM | POA: Diagnosis not present

## 2022-11-08 DIAGNOSIS — Z79899 Other long term (current) drug therapy: Secondary | ICD-10-CM | POA: Diagnosis not present

## 2022-11-08 DIAGNOSIS — Z7902 Long term (current) use of antithrombotics/antiplatelets: Secondary | ICD-10-CM | POA: Diagnosis not present

## 2022-11-08 DIAGNOSIS — Z794 Long term (current) use of insulin: Secondary | ICD-10-CM | POA: Diagnosis not present

## 2022-11-08 DIAGNOSIS — E785 Hyperlipidemia, unspecified: Secondary | ICD-10-CM | POA: Diagnosis not present

## 2022-11-08 DIAGNOSIS — I10 Essential (primary) hypertension: Secondary | ICD-10-CM | POA: Diagnosis not present

## 2022-11-08 DIAGNOSIS — E11622 Type 2 diabetes mellitus with other skin ulcer: Secondary | ICD-10-CM | POA: Diagnosis not present

## 2022-11-08 DIAGNOSIS — S91001A Unspecified open wound, right ankle, initial encounter: Secondary | ICD-10-CM | POA: Diagnosis not present

## 2022-11-16 DIAGNOSIS — S91001A Unspecified open wound, right ankle, initial encounter: Secondary | ICD-10-CM | POA: Diagnosis not present

## 2022-11-16 DIAGNOSIS — E785 Hyperlipidemia, unspecified: Secondary | ICD-10-CM | POA: Diagnosis not present

## 2022-11-16 DIAGNOSIS — L97909 Non-pressure chronic ulcer of unspecified part of unspecified lower leg with unspecified severity: Secondary | ICD-10-CM | POA: Diagnosis not present

## 2022-11-16 DIAGNOSIS — Z79899 Other long term (current) drug therapy: Secondary | ICD-10-CM | POA: Diagnosis not present

## 2022-11-16 DIAGNOSIS — T148XXA Other injury of unspecified body region, initial encounter: Secondary | ICD-10-CM | POA: Diagnosis not present

## 2022-11-16 DIAGNOSIS — E11622 Type 2 diabetes mellitus with other skin ulcer: Secondary | ICD-10-CM | POA: Diagnosis not present

## 2022-11-16 DIAGNOSIS — Z794 Long term (current) use of insulin: Secondary | ICD-10-CM | POA: Diagnosis not present

## 2022-11-16 DIAGNOSIS — I10 Essential (primary) hypertension: Secondary | ICD-10-CM | POA: Diagnosis not present

## 2022-11-16 DIAGNOSIS — L97918 Non-pressure chronic ulcer of unspecified part of right lower leg with other specified severity: Secondary | ICD-10-CM | POA: Diagnosis not present

## 2022-11-16 DIAGNOSIS — Z7902 Long term (current) use of antithrombotics/antiplatelets: Secondary | ICD-10-CM | POA: Diagnosis not present

## 2022-11-23 DIAGNOSIS — I10 Essential (primary) hypertension: Secondary | ICD-10-CM | POA: Diagnosis not present

## 2022-11-23 DIAGNOSIS — L408 Other psoriasis: Secondary | ICD-10-CM | POA: Diagnosis not present

## 2022-11-23 DIAGNOSIS — G8929 Other chronic pain: Secondary | ICD-10-CM | POA: Diagnosis not present

## 2022-11-23 DIAGNOSIS — Z299 Encounter for prophylactic measures, unspecified: Secondary | ICD-10-CM | POA: Diagnosis not present

## 2022-11-23 DIAGNOSIS — M542 Cervicalgia: Secondary | ICD-10-CM | POA: Diagnosis not present

## 2022-11-23 DIAGNOSIS — E1142 Type 2 diabetes mellitus with diabetic polyneuropathy: Secondary | ICD-10-CM | POA: Diagnosis not present

## 2022-11-23 DIAGNOSIS — E1165 Type 2 diabetes mellitus with hyperglycemia: Secondary | ICD-10-CM | POA: Diagnosis not present

## 2022-11-25 DIAGNOSIS — E11622 Type 2 diabetes mellitus with other skin ulcer: Secondary | ICD-10-CM | POA: Diagnosis not present

## 2022-11-25 DIAGNOSIS — Z794 Long term (current) use of insulin: Secondary | ICD-10-CM | POA: Diagnosis not present

## 2022-11-25 DIAGNOSIS — Z95820 Peripheral vascular angioplasty status with implants and grafts: Secondary | ICD-10-CM | POA: Diagnosis not present

## 2022-11-25 DIAGNOSIS — L97919 Non-pressure chronic ulcer of unspecified part of right lower leg with unspecified severity: Secondary | ICD-10-CM | POA: Diagnosis not present

## 2022-11-25 DIAGNOSIS — E1151 Type 2 diabetes mellitus with diabetic peripheral angiopathy without gangrene: Secondary | ICD-10-CM | POA: Diagnosis not present

## 2022-11-25 DIAGNOSIS — Z7902 Long term (current) use of antithrombotics/antiplatelets: Secondary | ICD-10-CM | POA: Diagnosis not present

## 2022-11-30 DIAGNOSIS — Z794 Long term (current) use of insulin: Secondary | ICD-10-CM | POA: Diagnosis not present

## 2022-11-30 DIAGNOSIS — Z7902 Long term (current) use of antithrombotics/antiplatelets: Secondary | ICD-10-CM | POA: Diagnosis not present

## 2022-11-30 DIAGNOSIS — L97919 Non-pressure chronic ulcer of unspecified part of right lower leg with unspecified severity: Secondary | ICD-10-CM | POA: Diagnosis not present

## 2022-11-30 DIAGNOSIS — E11621 Type 2 diabetes mellitus with foot ulcer: Secondary | ICD-10-CM | POA: Diagnosis not present

## 2022-11-30 DIAGNOSIS — E1151 Type 2 diabetes mellitus with diabetic peripheral angiopathy without gangrene: Secondary | ICD-10-CM | POA: Diagnosis not present

## 2022-11-30 DIAGNOSIS — E11622 Type 2 diabetes mellitus with other skin ulcer: Secondary | ICD-10-CM | POA: Diagnosis not present

## 2022-11-30 DIAGNOSIS — Z95820 Peripheral vascular angioplasty status with implants and grafts: Secondary | ICD-10-CM | POA: Diagnosis not present

## 2022-12-01 DIAGNOSIS — I10 Essential (primary) hypertension: Secondary | ICD-10-CM | POA: Diagnosis not present

## 2022-12-07 DIAGNOSIS — E1151 Type 2 diabetes mellitus with diabetic peripheral angiopathy without gangrene: Secondary | ICD-10-CM | POA: Diagnosis not present

## 2022-12-07 DIAGNOSIS — Z95820 Peripheral vascular angioplasty status with implants and grafts: Secondary | ICD-10-CM | POA: Diagnosis not present

## 2022-12-07 DIAGNOSIS — E11622 Type 2 diabetes mellitus with other skin ulcer: Secondary | ICD-10-CM | POA: Diagnosis not present

## 2022-12-07 DIAGNOSIS — Z7902 Long term (current) use of antithrombotics/antiplatelets: Secondary | ICD-10-CM | POA: Diagnosis not present

## 2022-12-07 DIAGNOSIS — Z794 Long term (current) use of insulin: Secondary | ICD-10-CM | POA: Diagnosis not present

## 2022-12-07 DIAGNOSIS — L97919 Non-pressure chronic ulcer of unspecified part of right lower leg with unspecified severity: Secondary | ICD-10-CM | POA: Diagnosis not present

## 2022-12-07 DIAGNOSIS — L97909 Non-pressure chronic ulcer of unspecified part of unspecified lower leg with unspecified severity: Secondary | ICD-10-CM | POA: Diagnosis not present

## 2022-12-17 ENCOUNTER — Other Ambulatory Visit: Payer: Self-pay | Admitting: *Deleted

## 2022-12-17 DIAGNOSIS — L97919 Non-pressure chronic ulcer of unspecified part of right lower leg with unspecified severity: Secondary | ICD-10-CM | POA: Diagnosis not present

## 2022-12-17 DIAGNOSIS — E11622 Type 2 diabetes mellitus with other skin ulcer: Secondary | ICD-10-CM | POA: Diagnosis not present

## 2022-12-17 DIAGNOSIS — L97909 Non-pressure chronic ulcer of unspecified part of unspecified lower leg with unspecified severity: Secondary | ICD-10-CM | POA: Diagnosis not present

## 2022-12-17 DIAGNOSIS — Z95820 Peripheral vascular angioplasty status with implants and grafts: Secondary | ICD-10-CM | POA: Diagnosis not present

## 2022-12-17 DIAGNOSIS — Z7902 Long term (current) use of antithrombotics/antiplatelets: Secondary | ICD-10-CM | POA: Diagnosis not present

## 2022-12-17 DIAGNOSIS — Z794 Long term (current) use of insulin: Secondary | ICD-10-CM | POA: Diagnosis not present

## 2022-12-17 DIAGNOSIS — E1151 Type 2 diabetes mellitus with diabetic peripheral angiopathy without gangrene: Secondary | ICD-10-CM | POA: Diagnosis not present

## 2022-12-17 MED ORDER — EZETIMIBE 10 MG PO TABS: 10.0000 mg | ORAL_TABLET | Freq: Every day | ORAL | 6 refills | Status: DC

## 2022-12-18 ENCOUNTER — Other Ambulatory Visit: Payer: Self-pay | Admitting: Cardiology

## 2022-12-21 DIAGNOSIS — Z299 Encounter for prophylactic measures, unspecified: Secondary | ICD-10-CM | POA: Diagnosis not present

## 2022-12-21 DIAGNOSIS — M542 Cervicalgia: Secondary | ICD-10-CM | POA: Diagnosis not present

## 2022-12-21 DIAGNOSIS — I10 Essential (primary) hypertension: Secondary | ICD-10-CM | POA: Diagnosis not present

## 2022-12-21 DIAGNOSIS — G8929 Other chronic pain: Secondary | ICD-10-CM | POA: Diagnosis not present

## 2022-12-21 DIAGNOSIS — R42 Dizziness and giddiness: Secondary | ICD-10-CM | POA: Diagnosis not present

## 2022-12-24 DIAGNOSIS — L97919 Non-pressure chronic ulcer of unspecified part of right lower leg with unspecified severity: Secondary | ICD-10-CM | POA: Diagnosis not present

## 2022-12-24 DIAGNOSIS — Z794 Long term (current) use of insulin: Secondary | ICD-10-CM | POA: Diagnosis not present

## 2022-12-24 DIAGNOSIS — Z7902 Long term (current) use of antithrombotics/antiplatelets: Secondary | ICD-10-CM | POA: Diagnosis not present

## 2022-12-24 DIAGNOSIS — Z95820 Peripheral vascular angioplasty status with implants and grafts: Secondary | ICD-10-CM | POA: Diagnosis not present

## 2022-12-24 DIAGNOSIS — E1151 Type 2 diabetes mellitus with diabetic peripheral angiopathy without gangrene: Secondary | ICD-10-CM | POA: Diagnosis not present

## 2022-12-24 DIAGNOSIS — E11622 Type 2 diabetes mellitus with other skin ulcer: Secondary | ICD-10-CM | POA: Diagnosis not present

## 2022-12-24 DIAGNOSIS — L97912 Non-pressure chronic ulcer of unspecified part of right lower leg with fat layer exposed: Secondary | ICD-10-CM | POA: Diagnosis not present

## 2022-12-31 DIAGNOSIS — E1122 Type 2 diabetes mellitus with diabetic chronic kidney disease: Secondary | ICD-10-CM | POA: Diagnosis not present

## 2022-12-31 DIAGNOSIS — E11622 Type 2 diabetes mellitus with other skin ulcer: Secondary | ICD-10-CM | POA: Diagnosis not present

## 2022-12-31 DIAGNOSIS — I129 Hypertensive chronic kidney disease with stage 1 through stage 4 chronic kidney disease, or unspecified chronic kidney disease: Secondary | ICD-10-CM | POA: Diagnosis not present

## 2022-12-31 DIAGNOSIS — E1151 Type 2 diabetes mellitus with diabetic peripheral angiopathy without gangrene: Secondary | ICD-10-CM | POA: Diagnosis not present

## 2022-12-31 DIAGNOSIS — Z95820 Peripheral vascular angioplasty status with implants and grafts: Secondary | ICD-10-CM | POA: Diagnosis not present

## 2022-12-31 DIAGNOSIS — I4891 Unspecified atrial fibrillation: Secondary | ICD-10-CM | POA: Diagnosis not present

## 2022-12-31 DIAGNOSIS — N189 Chronic kidney disease, unspecified: Secondary | ICD-10-CM | POA: Diagnosis not present

## 2022-12-31 DIAGNOSIS — Z7902 Long term (current) use of antithrombotics/antiplatelets: Secondary | ICD-10-CM | POA: Diagnosis not present

## 2022-12-31 DIAGNOSIS — L97909 Non-pressure chronic ulcer of unspecified part of unspecified lower leg with unspecified severity: Secondary | ICD-10-CM | POA: Diagnosis not present

## 2022-12-31 DIAGNOSIS — Z8673 Personal history of transient ischemic attack (TIA), and cerebral infarction without residual deficits: Secondary | ICD-10-CM | POA: Diagnosis not present

## 2022-12-31 DIAGNOSIS — L97919 Non-pressure chronic ulcer of unspecified part of right lower leg with unspecified severity: Secondary | ICD-10-CM | POA: Diagnosis not present

## 2022-12-31 DIAGNOSIS — G4730 Sleep apnea, unspecified: Secondary | ICD-10-CM | POA: Diagnosis not present

## 2022-12-31 DIAGNOSIS — Z794 Long term (current) use of insulin: Secondary | ICD-10-CM | POA: Diagnosis not present

## 2023-01-01 DIAGNOSIS — I10 Essential (primary) hypertension: Secondary | ICD-10-CM | POA: Diagnosis not present

## 2023-01-05 DIAGNOSIS — I10 Essential (primary) hypertension: Secondary | ICD-10-CM | POA: Diagnosis not present

## 2023-01-05 DIAGNOSIS — E1165 Type 2 diabetes mellitus with hyperglycemia: Secondary | ICD-10-CM | POA: Diagnosis not present

## 2023-01-05 DIAGNOSIS — Z299 Encounter for prophylactic measures, unspecified: Secondary | ICD-10-CM | POA: Diagnosis not present

## 2023-01-06 DIAGNOSIS — G4730 Sleep apnea, unspecified: Secondary | ICD-10-CM | POA: Diagnosis not present

## 2023-01-06 DIAGNOSIS — E1151 Type 2 diabetes mellitus with diabetic peripheral angiopathy without gangrene: Secondary | ICD-10-CM | POA: Diagnosis not present

## 2023-01-06 DIAGNOSIS — I129 Hypertensive chronic kidney disease with stage 1 through stage 4 chronic kidney disease, or unspecified chronic kidney disease: Secondary | ICD-10-CM | POA: Diagnosis not present

## 2023-01-06 DIAGNOSIS — Z7902 Long term (current) use of antithrombotics/antiplatelets: Secondary | ICD-10-CM | POA: Diagnosis not present

## 2023-01-06 DIAGNOSIS — E11622 Type 2 diabetes mellitus with other skin ulcer: Secondary | ICD-10-CM | POA: Diagnosis not present

## 2023-01-06 DIAGNOSIS — Z8673 Personal history of transient ischemic attack (TIA), and cerebral infarction without residual deficits: Secondary | ICD-10-CM | POA: Diagnosis not present

## 2023-01-06 DIAGNOSIS — E1122 Type 2 diabetes mellitus with diabetic chronic kidney disease: Secondary | ICD-10-CM | POA: Diagnosis not present

## 2023-01-06 DIAGNOSIS — Z95820 Peripheral vascular angioplasty status with implants and grafts: Secondary | ICD-10-CM | POA: Diagnosis not present

## 2023-01-06 DIAGNOSIS — N189 Chronic kidney disease, unspecified: Secondary | ICD-10-CM | POA: Diagnosis not present

## 2023-01-06 DIAGNOSIS — L97919 Non-pressure chronic ulcer of unspecified part of right lower leg with unspecified severity: Secondary | ICD-10-CM | POA: Diagnosis not present

## 2023-01-06 DIAGNOSIS — I4891 Unspecified atrial fibrillation: Secondary | ICD-10-CM | POA: Diagnosis not present

## 2023-01-06 DIAGNOSIS — Z794 Long term (current) use of insulin: Secondary | ICD-10-CM | POA: Diagnosis not present

## 2023-01-08 DIAGNOSIS — S2241XA Multiple fractures of ribs, right side, initial encounter for closed fracture: Secondary | ICD-10-CM | POA: Diagnosis not present

## 2023-01-08 DIAGNOSIS — R079 Chest pain, unspecified: Secondary | ICD-10-CM | POA: Diagnosis not present

## 2023-01-14 DIAGNOSIS — I129 Hypertensive chronic kidney disease with stage 1 through stage 4 chronic kidney disease, or unspecified chronic kidney disease: Secondary | ICD-10-CM | POA: Diagnosis not present

## 2023-01-14 DIAGNOSIS — Z794 Long term (current) use of insulin: Secondary | ICD-10-CM | POA: Diagnosis not present

## 2023-01-14 DIAGNOSIS — N189 Chronic kidney disease, unspecified: Secondary | ICD-10-CM | POA: Diagnosis not present

## 2023-01-14 DIAGNOSIS — I4891 Unspecified atrial fibrillation: Secondary | ICD-10-CM | POA: Diagnosis not present

## 2023-01-14 DIAGNOSIS — G4730 Sleep apnea, unspecified: Secondary | ICD-10-CM | POA: Diagnosis not present

## 2023-01-14 DIAGNOSIS — E1151 Type 2 diabetes mellitus with diabetic peripheral angiopathy without gangrene: Secondary | ICD-10-CM | POA: Diagnosis not present

## 2023-01-14 DIAGNOSIS — T148XXA Other injury of unspecified body region, initial encounter: Secondary | ICD-10-CM | POA: Diagnosis not present

## 2023-01-14 DIAGNOSIS — Z7902 Long term (current) use of antithrombotics/antiplatelets: Secondary | ICD-10-CM | POA: Diagnosis not present

## 2023-01-14 DIAGNOSIS — E1122 Type 2 diabetes mellitus with diabetic chronic kidney disease: Secondary | ICD-10-CM | POA: Diagnosis not present

## 2023-01-14 DIAGNOSIS — L97919 Non-pressure chronic ulcer of unspecified part of right lower leg with unspecified severity: Secondary | ICD-10-CM | POA: Diagnosis not present

## 2023-01-14 DIAGNOSIS — Z95820 Peripheral vascular angioplasty status with implants and grafts: Secondary | ICD-10-CM | POA: Diagnosis not present

## 2023-01-14 DIAGNOSIS — Z8673 Personal history of transient ischemic attack (TIA), and cerebral infarction without residual deficits: Secondary | ICD-10-CM | POA: Diagnosis not present

## 2023-01-14 DIAGNOSIS — E11622 Type 2 diabetes mellitus with other skin ulcer: Secondary | ICD-10-CM | POA: Diagnosis not present

## 2023-01-17 ENCOUNTER — Telehealth: Payer: Self-pay | Admitting: Cardiology

## 2023-01-17 MED ORDER — CLOPIDOGREL BISULFATE 75 MG PO TABS: 75.0000 mg | ORAL_TABLET | Freq: Every day | ORAL | 6 refills | Status: DC

## 2023-01-17 NOTE — Telephone Encounter (Signed)
*  STAT* If patient is at the pharmacy, call can be transferred to refill team.   1. Which medications need to be refilled? (please list name of each medication and dose if known)   clopidogrel (PLAVIX) 75 MG tablet   2. Which pharmacy/location (including street and city if local pharmacy) is medication to be sent to?  [facility] [place], [place] - [address]   3. Do they need a 30 day or 90 day supply?   30 day  Patient stated she is completely out of this medication.  Patient has appointment scheduled on 5/13.

## 2023-01-21 DIAGNOSIS — Z7902 Long term (current) use of antithrombotics/antiplatelets: Secondary | ICD-10-CM | POA: Diagnosis not present

## 2023-01-21 DIAGNOSIS — L97909 Non-pressure chronic ulcer of unspecified part of unspecified lower leg with unspecified severity: Secondary | ICD-10-CM | POA: Diagnosis not present

## 2023-01-21 DIAGNOSIS — Z95820 Peripheral vascular angioplasty status with implants and grafts: Secondary | ICD-10-CM | POA: Diagnosis not present

## 2023-01-21 DIAGNOSIS — L97919 Non-pressure chronic ulcer of unspecified part of right lower leg with unspecified severity: Secondary | ICD-10-CM | POA: Diagnosis not present

## 2023-01-21 DIAGNOSIS — E1151 Type 2 diabetes mellitus with diabetic peripheral angiopathy without gangrene: Secondary | ICD-10-CM | POA: Diagnosis not present

## 2023-01-21 DIAGNOSIS — I4891 Unspecified atrial fibrillation: Secondary | ICD-10-CM | POA: Diagnosis not present

## 2023-01-21 DIAGNOSIS — I129 Hypertensive chronic kidney disease with stage 1 through stage 4 chronic kidney disease, or unspecified chronic kidney disease: Secondary | ICD-10-CM | POA: Diagnosis not present

## 2023-01-21 DIAGNOSIS — Z8673 Personal history of transient ischemic attack (TIA), and cerebral infarction without residual deficits: Secondary | ICD-10-CM | POA: Diagnosis not present

## 2023-01-21 DIAGNOSIS — G4730 Sleep apnea, unspecified: Secondary | ICD-10-CM | POA: Diagnosis not present

## 2023-01-21 DIAGNOSIS — N189 Chronic kidney disease, unspecified: Secondary | ICD-10-CM | POA: Diagnosis not present

## 2023-01-21 DIAGNOSIS — E1122 Type 2 diabetes mellitus with diabetic chronic kidney disease: Secondary | ICD-10-CM | POA: Diagnosis not present

## 2023-01-21 DIAGNOSIS — E11622 Type 2 diabetes mellitus with other skin ulcer: Secondary | ICD-10-CM | POA: Diagnosis not present

## 2023-01-21 DIAGNOSIS — Z794 Long term (current) use of insulin: Secondary | ICD-10-CM | POA: Diagnosis not present

## 2023-01-24 ENCOUNTER — Other Ambulatory Visit: Payer: Self-pay | Admitting: Cardiology

## 2023-01-24 DIAGNOSIS — Z299 Encounter for prophylactic measures, unspecified: Secondary | ICD-10-CM | POA: Diagnosis not present

## 2023-01-24 DIAGNOSIS — E1165 Type 2 diabetes mellitus with hyperglycemia: Secondary | ICD-10-CM | POA: Diagnosis not present

## 2023-01-24 DIAGNOSIS — M542 Cervicalgia: Secondary | ICD-10-CM | POA: Diagnosis not present

## 2023-01-24 DIAGNOSIS — H6120 Impacted cerumen, unspecified ear: Secondary | ICD-10-CM | POA: Diagnosis not present

## 2023-01-24 DIAGNOSIS — Z79899 Other long term (current) drug therapy: Secondary | ICD-10-CM | POA: Diagnosis not present

## 2023-01-24 DIAGNOSIS — I10 Essential (primary) hypertension: Secondary | ICD-10-CM | POA: Diagnosis not present

## 2023-01-24 DIAGNOSIS — G8929 Other chronic pain: Secondary | ICD-10-CM | POA: Diagnosis not present

## 2023-01-27 DIAGNOSIS — N189 Chronic kidney disease, unspecified: Secondary | ICD-10-CM | POA: Diagnosis not present

## 2023-01-27 DIAGNOSIS — Z95820 Peripheral vascular angioplasty status with implants and grafts: Secondary | ICD-10-CM | POA: Diagnosis not present

## 2023-01-27 DIAGNOSIS — Z794 Long term (current) use of insulin: Secondary | ICD-10-CM | POA: Diagnosis not present

## 2023-01-27 DIAGNOSIS — E1151 Type 2 diabetes mellitus with diabetic peripheral angiopathy without gangrene: Secondary | ICD-10-CM | POA: Diagnosis not present

## 2023-01-27 DIAGNOSIS — R600 Localized edema: Secondary | ICD-10-CM | POA: Diagnosis not present

## 2023-01-27 DIAGNOSIS — I4891 Unspecified atrial fibrillation: Secondary | ICD-10-CM | POA: Diagnosis not present

## 2023-01-27 DIAGNOSIS — E11622 Type 2 diabetes mellitus with other skin ulcer: Secondary | ICD-10-CM | POA: Diagnosis not present

## 2023-01-27 DIAGNOSIS — Z7902 Long term (current) use of antithrombotics/antiplatelets: Secondary | ICD-10-CM | POA: Diagnosis not present

## 2023-01-27 DIAGNOSIS — E1122 Type 2 diabetes mellitus with diabetic chronic kidney disease: Secondary | ICD-10-CM | POA: Diagnosis not present

## 2023-01-27 DIAGNOSIS — J449 Chronic obstructive pulmonary disease, unspecified: Secondary | ICD-10-CM | POA: Diagnosis not present

## 2023-01-27 DIAGNOSIS — Z7984 Long term (current) use of oral hypoglycemic drugs: Secondary | ICD-10-CM | POA: Diagnosis not present

## 2023-01-27 DIAGNOSIS — E1142 Type 2 diabetes mellitus with diabetic polyneuropathy: Secondary | ICD-10-CM | POA: Diagnosis not present

## 2023-01-27 DIAGNOSIS — L97919 Non-pressure chronic ulcer of unspecified part of right lower leg with unspecified severity: Secondary | ICD-10-CM | POA: Diagnosis not present

## 2023-02-01 DIAGNOSIS — I10 Essential (primary) hypertension: Secondary | ICD-10-CM | POA: Diagnosis not present

## 2023-02-03 DIAGNOSIS — L97919 Non-pressure chronic ulcer of unspecified part of right lower leg with unspecified severity: Secondary | ICD-10-CM | POA: Diagnosis not present

## 2023-02-03 DIAGNOSIS — E1122 Type 2 diabetes mellitus with diabetic chronic kidney disease: Secondary | ICD-10-CM | POA: Diagnosis not present

## 2023-02-03 DIAGNOSIS — J449 Chronic obstructive pulmonary disease, unspecified: Secondary | ICD-10-CM | POA: Diagnosis not present

## 2023-02-03 DIAGNOSIS — E1142 Type 2 diabetes mellitus with diabetic polyneuropathy: Secondary | ICD-10-CM | POA: Diagnosis not present

## 2023-02-03 DIAGNOSIS — E11622 Type 2 diabetes mellitus with other skin ulcer: Secondary | ICD-10-CM | POA: Diagnosis not present

## 2023-02-03 DIAGNOSIS — Z7902 Long term (current) use of antithrombotics/antiplatelets: Secondary | ICD-10-CM | POA: Diagnosis not present

## 2023-02-03 DIAGNOSIS — R600 Localized edema: Secondary | ICD-10-CM | POA: Diagnosis not present

## 2023-02-03 DIAGNOSIS — I4891 Unspecified atrial fibrillation: Secondary | ICD-10-CM | POA: Diagnosis not present

## 2023-02-03 DIAGNOSIS — Z95820 Peripheral vascular angioplasty status with implants and grafts: Secondary | ICD-10-CM | POA: Diagnosis not present

## 2023-02-03 DIAGNOSIS — E1151 Type 2 diabetes mellitus with diabetic peripheral angiopathy without gangrene: Secondary | ICD-10-CM | POA: Diagnosis not present

## 2023-02-03 DIAGNOSIS — Z7984 Long term (current) use of oral hypoglycemic drugs: Secondary | ICD-10-CM | POA: Diagnosis not present

## 2023-02-03 DIAGNOSIS — Z794 Long term (current) use of insulin: Secondary | ICD-10-CM | POA: Diagnosis not present

## 2023-02-03 DIAGNOSIS — N189 Chronic kidney disease, unspecified: Secondary | ICD-10-CM | POA: Diagnosis not present

## 2023-02-10 ENCOUNTER — Other Ambulatory Visit: Payer: Self-pay | Admitting: Cardiology

## 2023-02-11 ENCOUNTER — Telehealth: Payer: Self-pay | Admitting: Cardiology

## 2023-02-11 DIAGNOSIS — Z7902 Long term (current) use of antithrombotics/antiplatelets: Secondary | ICD-10-CM | POA: Diagnosis not present

## 2023-02-11 DIAGNOSIS — N189 Chronic kidney disease, unspecified: Secondary | ICD-10-CM | POA: Diagnosis not present

## 2023-02-11 DIAGNOSIS — I4891 Unspecified atrial fibrillation: Secondary | ICD-10-CM | POA: Diagnosis not present

## 2023-02-11 DIAGNOSIS — E1151 Type 2 diabetes mellitus with diabetic peripheral angiopathy without gangrene: Secondary | ICD-10-CM | POA: Diagnosis not present

## 2023-02-11 DIAGNOSIS — Z95820 Peripheral vascular angioplasty status with implants and grafts: Secondary | ICD-10-CM | POA: Diagnosis not present

## 2023-02-11 DIAGNOSIS — L97919 Non-pressure chronic ulcer of unspecified part of right lower leg with unspecified severity: Secondary | ICD-10-CM | POA: Diagnosis not present

## 2023-02-11 DIAGNOSIS — J449 Chronic obstructive pulmonary disease, unspecified: Secondary | ICD-10-CM | POA: Diagnosis not present

## 2023-02-11 DIAGNOSIS — E1142 Type 2 diabetes mellitus with diabetic polyneuropathy: Secondary | ICD-10-CM | POA: Diagnosis not present

## 2023-02-11 DIAGNOSIS — Z794 Long term (current) use of insulin: Secondary | ICD-10-CM | POA: Diagnosis not present

## 2023-02-11 DIAGNOSIS — L03116 Cellulitis of left lower limb: Secondary | ICD-10-CM | POA: Diagnosis not present

## 2023-02-11 DIAGNOSIS — R600 Localized edema: Secondary | ICD-10-CM | POA: Diagnosis not present

## 2023-02-11 DIAGNOSIS — E11622 Type 2 diabetes mellitus with other skin ulcer: Secondary | ICD-10-CM | POA: Diagnosis not present

## 2023-02-11 DIAGNOSIS — Z7984 Long term (current) use of oral hypoglycemic drugs: Secondary | ICD-10-CM | POA: Diagnosis not present

## 2023-02-11 DIAGNOSIS — E1122 Type 2 diabetes mellitus with diabetic chronic kidney disease: Secondary | ICD-10-CM | POA: Diagnosis not present

## 2023-02-11 DIAGNOSIS — L97909 Non-pressure chronic ulcer of unspecified part of unspecified lower leg with unspecified severity: Secondary | ICD-10-CM | POA: Diagnosis not present

## 2023-02-11 MED ORDER — ICOSAPENT ETHYL 1 G PO CAPS: 1.0000 g | ORAL_CAPSULE | Freq: Two times a day (BID) | ORAL | 6 refills | Status: DC

## 2023-02-11 NOTE — Telephone Encounter (Signed)
. *  STAT* If patient is at the pharmacy, call can be transferred to refill team.   1. Which medications need to be refilled? (please list name of each medication and dose if known) need new prescription for Vascepa  2. Which pharmacy/location (including street and city if local pharmacy) is medication to be sent to? [facility]  [place]  3. Do they need a 30 day or 90 day supply? 60 days and refills

## 2023-02-14 ENCOUNTER — Ambulatory Visit: Payer: 59 | Attending: Nurse Practitioner | Admitting: Nurse Practitioner

## 2023-02-14 ENCOUNTER — Encounter: Payer: Self-pay | Admitting: Nurse Practitioner

## 2023-02-14 VITALS — BP 184/64 | HR 64 | Ht 64.0 in | Wt 177.4 lb

## 2023-02-14 DIAGNOSIS — R600 Localized edema: Secondary | ICD-10-CM

## 2023-02-14 DIAGNOSIS — I739 Peripheral vascular disease, unspecified: Secondary | ICD-10-CM

## 2023-02-14 DIAGNOSIS — I10 Essential (primary) hypertension: Secondary | ICD-10-CM | POA: Diagnosis not present

## 2023-02-14 DIAGNOSIS — Z79899 Other long term (current) drug therapy: Secondary | ICD-10-CM | POA: Diagnosis not present

## 2023-02-14 DIAGNOSIS — Z7985 Long-term (current) use of injectable non-insulin antidiabetic drugs: Secondary | ICD-10-CM

## 2023-02-14 DIAGNOSIS — I2510 Atherosclerotic heart disease of native coronary artery without angina pectoris: Secondary | ICD-10-CM

## 2023-02-14 DIAGNOSIS — E1159 Type 2 diabetes mellitus with other circulatory complications: Secondary | ICD-10-CM | POA: Diagnosis not present

## 2023-02-14 DIAGNOSIS — E782 Mixed hyperlipidemia: Secondary | ICD-10-CM | POA: Diagnosis not present

## 2023-02-14 MED ORDER — FUROSEMIDE 20 MG PO TABS: 20.0000 mg | ORAL_TABLET | Freq: Every day | ORAL | 1 refills | Status: DC

## 2023-02-14 MED ORDER — HYDRALAZINE HCL 25 MG PO TABS: 25.0000 mg | ORAL_TABLET | Freq: Three times a day (TID) | ORAL | 3 refills | Status: DC

## 2023-02-14 NOTE — Progress Notes (Unsigned)
Office Visit    Patient Name: [name] [name] Date of Encounter: 02/14/2023  PCP:  Medicine, [facility]   [facility]  Cardiologist:  Dina Rich, MD *** Advanced Practice Provider:  No care team member to display Electrophysiologist:  None  {Press F2 to show EP APP, CHF, sleep or structural heart MD               :[id number]}  { Click here to update then REFRESH NOTE - MD (PCP) or APP (Team Member)  Change PCP Type for MD, Specialty for APP is either Cardiology or Clinical Cardiac Electrophysiology  :[id number]}  Chief Complaint    [name] [name] is a 82 y.o. female with a hx of CAD, hypertension, type 2 diabetes, hypercholesterolemia, hyperlipidemia, peripheral vascular disease, COPD, GERD, and prior history of stroke, who presents today for 22-month follow-up.  Past Medical History    Past Medical History:  Diagnosis Date   Anxiety    Arthritis    "neck" (03/15/2018)   CAD (coronary artery disease)    Status post POBA distal LAD, status post Cardiolite Myoview 2008 negative for ischemia   COPD (chronic obstructive pulmonary disease) (HCC)    Depression    GERD (gastroesophageal reflux disease)    Hypercholesterolemia    Hypertension    Peripheral vascular disease (HCC)     status post iliac stent July 2001    Type II diabetes mellitus (HCC)    Past Surgical History:  Procedure Laterality Date   ANTERIOR CERVICAL DECOMP/DISCECTOMY FUSION  2001   BACK SURGERY     BREAST SURGERY     biopsy left breat-benign   CATARACT EXTRACTION, BILATERAL Bilateral    CHOLECYSTECTOMY OPEN     CORONARY ANGIOPLASTY     POBA distal LAD,   CORONARY STENT INTERVENTION N/A 03/15/2018   Procedure: CORONARY STENT INTERVENTION;  Surgeon: Corky Crafts, MD;  Location: [facility];  Service: Cardiovascular;  Laterality: N/A;   DILATION AND CURETTAGE OF UTERUS  1960s   EYE SURGERY Bilateral    "lasers"   LEFT HEART CATH AND CORONARY ANGIOGRAPHY N/A  03/15/2018   Procedure: LEFT HEART CATH AND CORONARY ANGIOGRAPHY;  Surgeon: Corky Crafts, MD;  Location: [facility] INVASIVE CV LAB;  Service: Cardiovascular;  Laterality: N/A;   PERIPHERAL VASCULAR INTERVENTION Right 04/2000    iliac stent    TUBAL LIGATION      Allergies  Allergies  Allergen Reactions   Alendronate     Other reaction(s): Other (See Comments) Leg edema   Clonidine Derivatives Other (See Comments)    dizziness   Crestor [Rosuvastatin]     Muscle cramps   Lisinopril Other (See Comments)    Tongue swelling   Losartan Other (See Comments)    TONGUE SWELLING   Codeine Nausea And Vomiting   Hydrocodone Nausea And Vomiting    History of Present Illness    [name] [name] is a 82 y.o. female with a PMH as mentioned above.  Previous cardiovascular history of remote history of MI in 1997 and angioplasty (unclear report/details regarding intervention), prior PTCA to dLAD in 2001. History of prior iliac stent in July 2001. DSE in 2013 revealed ST elevation in inferior leads, negative echo images for ischemia.  In 2019 CCTA suggested severe RCA disease, FFR was abnormal along left circumflex and distal LAD.  Underwent cardiac catheterization in 2019 (see report below), and received 3 drug-eluting stents to RCA, OM1 was found to have  75% stenosis, medically managed. Closely followed by vascular.   Was admitted in 2021 with CVA.  MRI showed acute right frontal stroke.  Neuro recommended continuing DAPT x 4 weeks and then Plavix only.  Previously completed aspirin.  Last seen by Dr. Dina Rich on June 28, 2022.  She reported side effects on PCSK9 inhibitors.  Did note progressing symptoms along her PAD.  Denied any chest pain.  No medication changes were made.  Was referred to Dr. Arbie Cookey with [facility].  Today she presents for 67-month follow-up.  She states   EKGs/Labs/Other Studies Reviewed:   The following studies were reviewed today: ***  EKG:  EKG is *** ordered  today.  The ekg ordered today demonstrates ***  Recent Labs: No results found for requested labs within last 365 days.  Recent Lipid Panel    Component Value Date/Time   CHOL 235 (H) 01/29/2020 0603   TRIG 225 (H) 01/29/2020 0603   HDL 37 (L) 01/29/2020 0603   CHOLHDL 6.4 01/29/2020 0603   VLDL 45 (H) 01/29/2020 0603   LDLCALC 153 (H) 01/29/2020 0603    Risk Assessment/Calculations:  {Does this patient have ATRIAL FIBRILLATION?[phone]}  Home Medications   No outpatient medications have been marked as taking for the 02/14/23 encounter (Appointment) with Sharlene Dory, NP.     Review of Systems   ***   All other systems reviewed and are otherwise negative except as noted above.  Physical Exam    VS:  There were no vitals taken for this visit. , BMI There is no height or weight on file to calculate BMI.  Wt Readings from Last 3 Encounters:  07/07/22 170 lb 12.8 oz (77.5 kg)  06/28/22 171 lb 6.4 oz (77.7 kg)  11/20/21 172 lb 6.4 oz (78.2 kg)     GEN: Well nourished, well developed, in no acute distress. HEENT: normal. Neck: Supple, no JVD, carotid bruits, or masses. Cardiac: ***RRR, no murmurs, rubs, or gallops. No clubbing, cyanosis, edema.  ***Radials/PT 2+ and equal bilaterally.  Respiratory:  ***Respirations regular and unlabored, clear to auscultation bilaterally. GI: Soft, nontender, nondistended. MS: No deformity or atrophy. Skin: Warm and dry, no rash. Neuro:  Strength and sensation are intact. Psych: Normal affect.  Assessment & Plan    ***  {Are you ordering a CV Procedure (e.g. stress test, cath, DCCV, TEE, etc)?   Press F2        :[id number]}    Recommend   Disposition: Follow up {follow up:15908} with Dina Rich, MD or APP.  Signed, Sharlene Dory, NP 02/14/2023, 8:17 AM [facility]

## 2023-02-14 NOTE — Patient Instructions (Addendum)
Medication Instructions:  Your physician has recommended you make the following change in your medication:  Change hydralazine 25 mg to taking 1 whole tablet in the morning and continue 1/2 tablet for midday and evening dose. Start furosemide 20 mg daily Continue other medications the same  Labwork: BMET, BNP and Mag level in 1 week (02/21/2023) at [facility] or [facility] ([address] [place]. [place]) Non-fasting  Testing/Procedures: none  Follow-Up: Your physician recommends that you schedule a follow-up appointment in: 2-3 months  Any Other Special Instructions Will Be Listed Below (If Applicable). You have been referred to the Infusion Center at [facility] for Leqvio infusions. You will be contacted about this.  If you need a refill on your cardiac medications before your next appointment, please call your pharmacy.

## 2023-02-15 ENCOUNTER — Encounter (HOSPITAL_COMMUNITY): Payer: Self-pay | Admitting: Nurse Practitioner

## 2023-02-15 ENCOUNTER — Telehealth: Payer: Self-pay | Admitting: Cardiology

## 2023-02-15 MED ORDER — CLOPIDOGREL BISULFATE 75 MG PO TABS: 75.0000 mg | ORAL_TABLET | Freq: Every day | ORAL | 6 refills | Status: DC

## 2023-02-15 NOTE — Telephone Encounter (Signed)
*  STAT* If patient is at the pharmacy, call can be transferred to refill team.   1. Which medications need to be refilled? (please list name of each medication and dose if known) clopidogrel (PLAVIX) 75 MG tablet Take 1 tablet (75 mg total) by mouth daily.   2. Which pharmacy/location (including street and city if local pharmacy) is medication to be sent to? [facility] [place], [place] - [address]   3. Do they need a 30 day or 90 day supply? 30 Day Supply

## 2023-02-15 NOTE — Telephone Encounter (Signed)
Done

## 2023-02-18 DIAGNOSIS — N189 Chronic kidney disease, unspecified: Secondary | ICD-10-CM | POA: Diagnosis not present

## 2023-02-18 DIAGNOSIS — L97319 Non-pressure chronic ulcer of right ankle with unspecified severity: Secondary | ICD-10-CM | POA: Diagnosis not present

## 2023-02-18 DIAGNOSIS — E11622 Type 2 diabetes mellitus with other skin ulcer: Secondary | ICD-10-CM | POA: Diagnosis not present

## 2023-02-18 DIAGNOSIS — E1122 Type 2 diabetes mellitus with diabetic chronic kidney disease: Secondary | ICD-10-CM | POA: Diagnosis not present

## 2023-02-18 DIAGNOSIS — L97909 Non-pressure chronic ulcer of unspecified part of unspecified lower leg with unspecified severity: Secondary | ICD-10-CM | POA: Diagnosis not present

## 2023-02-18 DIAGNOSIS — Z794 Long term (current) use of insulin: Secondary | ICD-10-CM | POA: Diagnosis not present

## 2023-02-18 DIAGNOSIS — Z95820 Peripheral vascular angioplasty status with implants and grafts: Secondary | ICD-10-CM | POA: Diagnosis not present

## 2023-02-18 DIAGNOSIS — L97919 Non-pressure chronic ulcer of unspecified part of right lower leg with unspecified severity: Secondary | ICD-10-CM | POA: Diagnosis not present

## 2023-02-18 DIAGNOSIS — Z7984 Long term (current) use of oral hypoglycemic drugs: Secondary | ICD-10-CM | POA: Diagnosis not present

## 2023-02-18 DIAGNOSIS — R600 Localized edema: Secondary | ICD-10-CM | POA: Diagnosis not present

## 2023-02-18 DIAGNOSIS — I4891 Unspecified atrial fibrillation: Secondary | ICD-10-CM | POA: Diagnosis not present

## 2023-02-18 DIAGNOSIS — E1151 Type 2 diabetes mellitus with diabetic peripheral angiopathy without gangrene: Secondary | ICD-10-CM | POA: Diagnosis not present

## 2023-02-18 DIAGNOSIS — J449 Chronic obstructive pulmonary disease, unspecified: Secondary | ICD-10-CM | POA: Diagnosis not present

## 2023-02-18 DIAGNOSIS — E1142 Type 2 diabetes mellitus with diabetic polyneuropathy: Secondary | ICD-10-CM | POA: Diagnosis not present

## 2023-02-18 DIAGNOSIS — Z7902 Long term (current) use of antithrombotics/antiplatelets: Secondary | ICD-10-CM | POA: Diagnosis not present

## 2023-02-21 DIAGNOSIS — Z79899 Other long term (current) drug therapy: Secondary | ICD-10-CM | POA: Diagnosis not present

## 2023-02-21 DIAGNOSIS — I10 Essential (primary) hypertension: Secondary | ICD-10-CM | POA: Diagnosis not present

## 2023-02-21 DIAGNOSIS — R601 Generalized edema: Secondary | ICD-10-CM | POA: Diagnosis not present

## 2023-02-21 DIAGNOSIS — E782 Mixed hyperlipidemia: Secondary | ICD-10-CM | POA: Diagnosis not present

## 2023-02-24 DIAGNOSIS — F32A Depression, unspecified: Secondary | ICD-10-CM | POA: Diagnosis not present

## 2023-02-24 DIAGNOSIS — D6859 Other primary thrombophilia: Secondary | ICD-10-CM | POA: Diagnosis not present

## 2023-02-24 DIAGNOSIS — L03115 Cellulitis of right lower limb: Secondary | ICD-10-CM | POA: Diagnosis not present

## 2023-02-24 DIAGNOSIS — Z794 Long term (current) use of insulin: Secondary | ICD-10-CM | POA: Diagnosis not present

## 2023-02-24 DIAGNOSIS — E1151 Type 2 diabetes mellitus with diabetic peripheral angiopathy without gangrene: Secondary | ICD-10-CM | POA: Diagnosis not present

## 2023-02-24 DIAGNOSIS — I129 Hypertensive chronic kidney disease with stage 1 through stage 4 chronic kidney disease, or unspecified chronic kidney disease: Secondary | ICD-10-CM | POA: Diagnosis not present

## 2023-02-24 DIAGNOSIS — I69354 Hemiplegia and hemiparesis following cerebral infarction affecting left non-dominant side: Secondary | ICD-10-CM | POA: Diagnosis not present

## 2023-02-24 DIAGNOSIS — N189 Chronic kidney disease, unspecified: Secondary | ICD-10-CM | POA: Diagnosis not present

## 2023-02-24 DIAGNOSIS — K219 Gastro-esophageal reflux disease without esophagitis: Secondary | ICD-10-CM | POA: Diagnosis not present

## 2023-02-24 DIAGNOSIS — L97319 Non-pressure chronic ulcer of right ankle with unspecified severity: Secondary | ICD-10-CM | POA: Diagnosis not present

## 2023-02-24 DIAGNOSIS — I4891 Unspecified atrial fibrillation: Secondary | ICD-10-CM | POA: Diagnosis not present

## 2023-02-24 DIAGNOSIS — I2510 Atherosclerotic heart disease of native coronary artery without angina pectoris: Secondary | ICD-10-CM | POA: Diagnosis not present

## 2023-02-24 DIAGNOSIS — M549 Dorsalgia, unspecified: Secondary | ICD-10-CM | POA: Diagnosis not present

## 2023-02-24 DIAGNOSIS — H353290 Exudative age-related macular degeneration, unspecified eye, stage unspecified: Secondary | ICD-10-CM | POA: Diagnosis not present

## 2023-02-24 DIAGNOSIS — E1122 Type 2 diabetes mellitus with diabetic chronic kidney disease: Secondary | ICD-10-CM | POA: Diagnosis not present

## 2023-02-24 DIAGNOSIS — G4700 Insomnia, unspecified: Secondary | ICD-10-CM | POA: Diagnosis not present

## 2023-02-24 DIAGNOSIS — L409 Psoriasis, unspecified: Secondary | ICD-10-CM | POA: Diagnosis not present

## 2023-02-24 DIAGNOSIS — E113299 Type 2 diabetes mellitus with mild nonproliferative diabetic retinopathy without macular edema, unspecified eye: Secondary | ICD-10-CM | POA: Diagnosis not present

## 2023-02-24 DIAGNOSIS — J449 Chronic obstructive pulmonary disease, unspecified: Secondary | ICD-10-CM | POA: Diagnosis not present

## 2023-02-24 DIAGNOSIS — G8929 Other chronic pain: Secondary | ICD-10-CM | POA: Diagnosis not present

## 2023-02-24 DIAGNOSIS — E1142 Type 2 diabetes mellitus with diabetic polyneuropathy: Secondary | ICD-10-CM | POA: Diagnosis not present

## 2023-02-24 DIAGNOSIS — H3530 Unspecified macular degeneration: Secondary | ICD-10-CM | POA: Diagnosis not present

## 2023-02-24 DIAGNOSIS — L239 Allergic contact dermatitis, unspecified cause: Secondary | ICD-10-CM | POA: Diagnosis not present

## 2023-02-24 DIAGNOSIS — E11622 Type 2 diabetes mellitus with other skin ulcer: Secondary | ICD-10-CM | POA: Diagnosis not present

## 2023-02-25 DIAGNOSIS — K219 Gastro-esophageal reflux disease without esophagitis: Secondary | ICD-10-CM | POA: Diagnosis not present

## 2023-02-25 DIAGNOSIS — J449 Chronic obstructive pulmonary disease, unspecified: Secondary | ICD-10-CM | POA: Diagnosis not present

## 2023-02-25 DIAGNOSIS — I4891 Unspecified atrial fibrillation: Secondary | ICD-10-CM | POA: Diagnosis not present

## 2023-02-25 DIAGNOSIS — Z888 Allergy status to other drugs, medicaments and biological substances status: Secondary | ICD-10-CM | POA: Diagnosis not present

## 2023-02-25 DIAGNOSIS — I69354 Hemiplegia and hemiparesis following cerebral infarction affecting left non-dominant side: Secondary | ICD-10-CM | POA: Diagnosis not present

## 2023-02-25 DIAGNOSIS — I129 Hypertensive chronic kidney disease with stage 1 through stage 4 chronic kidney disease, or unspecified chronic kidney disease: Secondary | ICD-10-CM | POA: Diagnosis not present

## 2023-02-25 DIAGNOSIS — E113219 Type 2 diabetes mellitus with mild nonproliferative diabetic retinopathy with macular edema, unspecified eye: Secondary | ICD-10-CM | POA: Diagnosis not present

## 2023-02-25 DIAGNOSIS — E785 Hyperlipidemia, unspecified: Secondary | ICD-10-CM | POA: Diagnosis not present

## 2023-02-25 DIAGNOSIS — L97909 Non-pressure chronic ulcer of unspecified part of unspecified lower leg with unspecified severity: Secondary | ICD-10-CM | POA: Diagnosis not present

## 2023-02-25 DIAGNOSIS — Z7902 Long term (current) use of antithrombotics/antiplatelets: Secondary | ICD-10-CM | POA: Diagnosis not present

## 2023-02-25 DIAGNOSIS — E11622 Type 2 diabetes mellitus with other skin ulcer: Secondary | ICD-10-CM | POA: Diagnosis not present

## 2023-02-25 DIAGNOSIS — E1151 Type 2 diabetes mellitus with diabetic peripheral angiopathy without gangrene: Secondary | ICD-10-CM | POA: Diagnosis not present

## 2023-02-25 DIAGNOSIS — Z95820 Peripheral vascular angioplasty status with implants and grafts: Secondary | ICD-10-CM | POA: Diagnosis not present

## 2023-02-25 DIAGNOSIS — L409 Psoriasis, unspecified: Secondary | ICD-10-CM | POA: Diagnosis not present

## 2023-02-25 DIAGNOSIS — E559 Vitamin D deficiency, unspecified: Secondary | ICD-10-CM | POA: Diagnosis not present

## 2023-02-25 DIAGNOSIS — E1142 Type 2 diabetes mellitus with diabetic polyneuropathy: Secondary | ICD-10-CM | POA: Diagnosis not present

## 2023-02-25 DIAGNOSIS — Z7984 Long term (current) use of oral hypoglycemic drugs: Secondary | ICD-10-CM | POA: Diagnosis not present

## 2023-02-25 DIAGNOSIS — E1122 Type 2 diabetes mellitus with diabetic chronic kidney disease: Secondary | ICD-10-CM | POA: Diagnosis not present

## 2023-02-25 DIAGNOSIS — L97919 Non-pressure chronic ulcer of unspecified part of right lower leg with unspecified severity: Secondary | ICD-10-CM | POA: Diagnosis not present

## 2023-02-25 DIAGNOSIS — G4730 Sleep apnea, unspecified: Secondary | ICD-10-CM | POA: Diagnosis not present

## 2023-02-25 DIAGNOSIS — I779 Disorder of arteries and arterioles, unspecified: Secondary | ICD-10-CM | POA: Diagnosis not present

## 2023-02-25 DIAGNOSIS — Z79899 Other long term (current) drug therapy: Secondary | ICD-10-CM | POA: Diagnosis not present

## 2023-02-25 DIAGNOSIS — Z794 Long term (current) use of insulin: Secondary | ICD-10-CM | POA: Diagnosis not present

## 2023-03-01 ENCOUNTER — Telehealth: Payer: Self-pay | Admitting: Cardiology

## 2023-03-01 MED ORDER — EZETIMIBE 10 MG PO TABS: 10.0000 mg | ORAL_TABLET | Freq: Every day | ORAL | 6 refills | Status: DC

## 2023-03-01 MED ORDER — LABETALOL HCL 200 MG PO TABS: ORAL_TABLET | ORAL | 6 refills | Status: DC

## 2023-03-01 MED ORDER — AMLODIPINE BESYLATE 5 MG PO TABS: 5.0000 mg | ORAL_TABLET | Freq: Every day | ORAL | 6 refills | Status: DC

## 2023-03-01 NOTE — Telephone Encounter (Signed)
*  STAT* If patient is at the pharmacy, call can be transferred to refill team.   1. Which medications need to be refilled? (please list name of each medication and dose if known) new prescription- changing pharmacy Amlodipine, Ezetimibe and Labetalol  2. Which pharmacy/location (including street and city if local pharmacy) is medication to be sent to? [facility]   3. Do they need a 30 day or 90 day supply? 60 days and refills

## 2023-03-01 NOTE — Telephone Encounter (Signed)
Refill completed.

## 2023-03-02 DIAGNOSIS — I2510 Atherosclerotic heart disease of native coronary artery without angina pectoris: Secondary | ICD-10-CM | POA: Diagnosis not present

## 2023-03-02 DIAGNOSIS — E1122 Type 2 diabetes mellitus with diabetic chronic kidney disease: Secondary | ICD-10-CM | POA: Diagnosis not present

## 2023-03-02 DIAGNOSIS — H3530 Unspecified macular degeneration: Secondary | ICD-10-CM | POA: Diagnosis not present

## 2023-03-02 DIAGNOSIS — N189 Chronic kidney disease, unspecified: Secondary | ICD-10-CM | POA: Diagnosis not present

## 2023-03-02 DIAGNOSIS — I4891 Unspecified atrial fibrillation: Secondary | ICD-10-CM | POA: Diagnosis not present

## 2023-03-02 DIAGNOSIS — H353290 Exudative age-related macular degeneration, unspecified eye, stage unspecified: Secondary | ICD-10-CM | POA: Diagnosis not present

## 2023-03-02 DIAGNOSIS — I69354 Hemiplegia and hemiparesis following cerebral infarction affecting left non-dominant side: Secondary | ICD-10-CM | POA: Diagnosis not present

## 2023-03-02 DIAGNOSIS — L239 Allergic contact dermatitis, unspecified cause: Secondary | ICD-10-CM | POA: Diagnosis not present

## 2023-03-02 DIAGNOSIS — D6859 Other primary thrombophilia: Secondary | ICD-10-CM | POA: Diagnosis not present

## 2023-03-02 DIAGNOSIS — J449 Chronic obstructive pulmonary disease, unspecified: Secondary | ICD-10-CM | POA: Diagnosis not present

## 2023-03-02 DIAGNOSIS — L409 Psoriasis, unspecified: Secondary | ICD-10-CM | POA: Diagnosis not present

## 2023-03-02 DIAGNOSIS — E1142 Type 2 diabetes mellitus with diabetic polyneuropathy: Secondary | ICD-10-CM | POA: Diagnosis not present

## 2023-03-02 DIAGNOSIS — I129 Hypertensive chronic kidney disease with stage 1 through stage 4 chronic kidney disease, or unspecified chronic kidney disease: Secondary | ICD-10-CM | POA: Diagnosis not present

## 2023-03-02 DIAGNOSIS — L97319 Non-pressure chronic ulcer of right ankle with unspecified severity: Secondary | ICD-10-CM | POA: Diagnosis not present

## 2023-03-02 DIAGNOSIS — E1151 Type 2 diabetes mellitus with diabetic peripheral angiopathy without gangrene: Secondary | ICD-10-CM | POA: Diagnosis not present

## 2023-03-02 DIAGNOSIS — K219 Gastro-esophageal reflux disease without esophagitis: Secondary | ICD-10-CM | POA: Diagnosis not present

## 2023-03-02 DIAGNOSIS — E11622 Type 2 diabetes mellitus with other skin ulcer: Secondary | ICD-10-CM | POA: Diagnosis not present

## 2023-03-02 DIAGNOSIS — G8929 Other chronic pain: Secondary | ICD-10-CM | POA: Diagnosis not present

## 2023-03-02 DIAGNOSIS — F32A Depression, unspecified: Secondary | ICD-10-CM | POA: Diagnosis not present

## 2023-03-02 DIAGNOSIS — M549 Dorsalgia, unspecified: Secondary | ICD-10-CM | POA: Diagnosis not present

## 2023-03-02 DIAGNOSIS — L03115 Cellulitis of right lower limb: Secondary | ICD-10-CM | POA: Diagnosis not present

## 2023-03-02 DIAGNOSIS — G4700 Insomnia, unspecified: Secondary | ICD-10-CM | POA: Diagnosis not present

## 2023-03-02 DIAGNOSIS — Z794 Long term (current) use of insulin: Secondary | ICD-10-CM | POA: Diagnosis not present

## 2023-03-02 DIAGNOSIS — E113299 Type 2 diabetes mellitus with mild nonproliferative diabetic retinopathy without macular edema, unspecified eye: Secondary | ICD-10-CM | POA: Diagnosis not present

## 2023-03-03 DIAGNOSIS — J449 Chronic obstructive pulmonary disease, unspecified: Secondary | ICD-10-CM | POA: Diagnosis not present

## 2023-03-03 DIAGNOSIS — Z299 Encounter for prophylactic measures, unspecified: Secondary | ICD-10-CM | POA: Diagnosis not present

## 2023-03-03 DIAGNOSIS — I10 Essential (primary) hypertension: Secondary | ICD-10-CM | POA: Diagnosis not present

## 2023-03-03 DIAGNOSIS — E1165 Type 2 diabetes mellitus with hyperglycemia: Secondary | ICD-10-CM | POA: Diagnosis not present

## 2023-03-03 DIAGNOSIS — G8929 Other chronic pain: Secondary | ICD-10-CM | POA: Diagnosis not present

## 2023-03-03 DIAGNOSIS — M542 Cervicalgia: Secondary | ICD-10-CM | POA: Diagnosis not present

## 2023-03-04 DIAGNOSIS — I10 Essential (primary) hypertension: Secondary | ICD-10-CM | POA: Diagnosis not present

## 2023-03-07 ENCOUNTER — Other Ambulatory Visit: Payer: Self-pay

## 2023-03-11 ENCOUNTER — Telehealth: Payer: Self-pay | Admitting: Pharmacy Technician

## 2023-03-11 NOTE — Telephone Encounter (Addendum)
Auth Submission: APPROVED Site of care: Site of care: [facility] Payer:[employer] MEDICARE Medication & CPT/J Code(s) submitted: Leqvio (Inclisiran) [id number] Route of submission (phone, fax, portal): PORTAL Phone # Fax # Auth type: Buy/Bill Units/visits requested: X 3 Reference number: [id number] Approval from: 03/08/23 to 03/07/24

## 2023-03-16 ENCOUNTER — Telehealth: Payer: Self-pay | Admitting: Nurse Practitioner

## 2023-03-16 NOTE — Telephone Encounter (Signed)
Per Arminda Resides, f/u Scheduling was able to reach patient and her appt is 02/18/23.

## 2023-03-16 NOTE — Telephone Encounter (Signed)
Patient called stating the last time she was in the office [name] [name] told her she was going to refer her about getting an injection every 6 months for her cholesterol.  Patient states she still hasn't received a call. Please advise.

## 2023-03-16 NOTE — Telephone Encounter (Signed)
Message sent to Arminda Resides via email checking status.

## 2023-03-21 ENCOUNTER — Encounter (HOSPITAL_COMMUNITY)
Admission: RE | Admit: 2023-03-21 | Discharge: 2023-03-21 | Disposition: A | Discharge status: Home / Self Care | Payer: 59 | Source: Ambulatory Visit | Attending: Cardiology | Admitting: Cardiology

## 2023-03-21 VITALS — BP 160/57 | HR 62 | Temp 97.7°F | Resp 18

## 2023-03-21 DIAGNOSIS — E785 Hyperlipidemia, unspecified: Secondary | ICD-10-CM | POA: Insufficient documentation

## 2023-03-21 DIAGNOSIS — E782 Mixed hyperlipidemia: Secondary | ICD-10-CM

## 2023-03-21 MED ORDER — INCLISIRAN SODIUM 284 MG/1.5ML ~~LOC~~ SOSY
284.0000 mg | PREFILLED_SYRINGE | Freq: Once | SUBCUTANEOUS | Status: AC
  Administered 2023-03-21: 284 mg via SUBCUTANEOUS
  Filled 2023-03-21: qty 1.5

## 2023-03-21 NOTE — Progress Notes (Signed)
Diagnosis: Hyperlipidemia  Provider:   Wayne Sever MD  Procedure: Injection  Leqvio (inclisiran), Dose: 284 mg, Site: subcutaneous, Number of injections: 1  Post Care: Observation period completed  Discharge: Condition: Good, Destination: Home . AVS Provided  Performed by:  Wyvonne Lenz, RN

## 2023-03-22 DIAGNOSIS — I779 Disorder of arteries and arterioles, unspecified: Secondary | ICD-10-CM | POA: Diagnosis not present

## 2023-03-22 DIAGNOSIS — J449 Chronic obstructive pulmonary disease, unspecified: Secondary | ICD-10-CM | POA: Diagnosis not present

## 2023-03-22 DIAGNOSIS — Z299 Encounter for prophylactic measures, unspecified: Secondary | ICD-10-CM | POA: Diagnosis not present

## 2023-03-22 DIAGNOSIS — Z0000 Encounter for general adult medical examination without abnormal findings: Secondary | ICD-10-CM | POA: Diagnosis not present

## 2023-03-22 DIAGNOSIS — Z7189 Other specified counseling: Secondary | ICD-10-CM | POA: Diagnosis not present

## 2023-03-22 DIAGNOSIS — I10 Essential (primary) hypertension: Secondary | ICD-10-CM | POA: Diagnosis not present

## 2023-03-31 DIAGNOSIS — E113293 Type 2 diabetes mellitus with mild nonproliferative diabetic retinopathy without macular edema, bilateral: Secondary | ICD-10-CM | POA: Diagnosis not present

## 2023-03-31 DIAGNOSIS — H353221 Exudative age-related macular degeneration, left eye, with active choroidal neovascularization: Secondary | ICD-10-CM | POA: Diagnosis not present

## 2023-03-31 DIAGNOSIS — Z961 Presence of intraocular lens: Secondary | ICD-10-CM | POA: Diagnosis not present

## 2023-03-31 DIAGNOSIS — H401132 Primary open-angle glaucoma, bilateral, moderate stage: Secondary | ICD-10-CM | POA: Diagnosis not present

## 2023-04-01 ENCOUNTER — Other Ambulatory Visit: Payer: Self-pay | Admitting: Nurse Practitioner

## 2023-04-01 ENCOUNTER — Telehealth: Payer: Self-pay | Admitting: Cardiology

## 2023-04-01 MED ORDER — ICOSAPENT ETHYL 1 G PO CAPS: 1.0000 g | ORAL_CAPSULE | Freq: Two times a day (BID) | ORAL | 6 refills | Status: DC

## 2023-04-01 NOTE — Telephone Encounter (Signed)
Pt c/o medication issue:  1. Name of Medication: icosapent Ethyl (VASCEPA) 1 g capsule    2. How are you currently taking this medication (dosage and times per day)?   Take 1 capsule (1 g total) by mouth 2 (two) times daily.    3. Are you having a reaction (difficulty breathing--STAT)? No  4. What is your medication issue? Pt wants to know if she should continue taking this medication or stop when she takes her last pill today.

## 2023-04-03 DIAGNOSIS — I10 Essential (primary) hypertension: Secondary | ICD-10-CM | POA: Diagnosis not present

## 2023-04-19 ENCOUNTER — Encounter: Payer: Self-pay | Admitting: Nurse Practitioner

## 2023-04-19 ENCOUNTER — Encounter: Payer: 59 | Attending: Cardiology | Admitting: Nurse Practitioner

## 2023-04-19 VITALS — BP 110/54 | HR 65 | Ht 65.0 in | Wt 174.0 lb

## 2023-04-19 DIAGNOSIS — I10 Essential (primary) hypertension: Secondary | ICD-10-CM

## 2023-04-19 DIAGNOSIS — I739 Peripheral vascular disease, unspecified: Secondary | ICD-10-CM | POA: Diagnosis not present

## 2023-04-19 DIAGNOSIS — R531 Weakness: Secondary | ICD-10-CM | POA: Diagnosis not present

## 2023-04-19 DIAGNOSIS — Z794 Long term (current) use of insulin: Secondary | ICD-10-CM

## 2023-04-19 DIAGNOSIS — I2510 Atherosclerotic heart disease of native coronary artery without angina pectoris: Secondary | ICD-10-CM

## 2023-04-19 DIAGNOSIS — E1159 Type 2 diabetes mellitus with other circulatory complications: Secondary | ICD-10-CM | POA: Diagnosis not present

## 2023-04-19 DIAGNOSIS — R42 Dizziness and giddiness: Secondary | ICD-10-CM

## 2023-04-19 DIAGNOSIS — E785 Hyperlipidemia, unspecified: Secondary | ICD-10-CM | POA: Diagnosis not present

## 2023-04-19 DIAGNOSIS — R600 Localized edema: Secondary | ICD-10-CM

## 2023-04-19 MED ORDER — HYDRALAZINE HCL 25 MG PO TABS: 25.0000 mg | ORAL_TABLET | Freq: Two times a day (BID) | ORAL | 3 refills | Status: DC

## 2023-04-19 MED ORDER — LEQVIO 284 MG/1.5ML ~~LOC~~ SOSY: 284.0000 mg | PREFILLED_SYRINGE | Freq: Once | SUBCUTANEOUS | Status: AC

## 2023-04-19 NOTE — Patient Instructions (Addendum)
Medication Instructions:  Your physician has recommended you make the following change in your medication:  Reduce Hydralazine to 25 Mg twice daily  Continue all medications as prescribed.   Labwork: none  Testing/Procedures: None   Follow-Up: Your physician recommends that you schedule a follow-up appointment in: 3 Months with Philis Nettle   Any Other Special Instructions Will Be Listed Below (If Applicable).  If you need a refill on your cardiac medications before your next appointment, please call your pharmacy.

## 2023-04-19 NOTE — Progress Notes (Addendum)
Office Visit    Patient Name: [name] [name] Date of Encounter: 04/19/2023 PCP:  Medicine, [facility]  Cardiologist:  Dina Rich, MD  Advanced Practice Provider:  No care team member to display Electrophysiologist:  None   Chief Complaint    [name] [name] is a 82 y.o. female with a hx of CAD, hypertension, type 2 diabetes, hypercholesterolemia, hyperlipidemia, peripheral vascular disease, COPD, GERD, and prior history of stroke, who presents today for follow-up.   Previous cardiovascular history of remote history of MI in 1997 and angioplasty (unclear report/details regarding intervention), prior PTCA to dLAD in 2001. History of prior iliac stent in July 2001. DSE in 2013 revealed ST elevation in inferior leads, negative echo images for ischemia.  In 2019 CCTA suggested severe RCA disease, FFR was abnormal along left circumflex and distal LAD.  Underwent cardiac catheterization in 2019 (see report below), and received 3 drug-eluting stents to RCA, OM1 was found to have 75% stenosis, medically managed. Closely followed by [facility].   Was admitted in 2021 with CVA.  MRI showed acute right frontal stroke.  Neuro recommended continuing DAPT x 4 weeks and then Plavix only.  Previously completed aspirin.   Today she presents for follow-up.  Doing well. Has started Leqvio infusions at [facility]. Tolerating well. Does admit to leg weakness and back arthritis, denies any recent falls, but says at times it feels like she's going to fall. Ever since adjusting dose of Hydralazine at last OV, does admit to occasional lightheadedness. Overall doing well from a cardiac perspective.  Denies any chest pain, shortness of breath, palpitations, syncope, presyncope, dizziness, orthopnea, PND, significant weight changes, acute bleeding, or claudication.  EKGs/Labs/Other Studies Reviewed:   The following studies were reviewed today:   EKG:  EKG is not  ordered today.    Vascular UA aorta/IVC/Iliacs 04/2022: Right common and right external iliac artery have >50% stenosis. There is  >50% in-stent left common iliac artery stenosis.   IVC/Iliac:   See ABI study.    *See table(s) above for measurements and observations.  Suggest follow up study in 12 months.   ABI's 04/2022:  Summary:  Right: Resting right ankle-brachial index is within normal range. No  evidence of significant right lower extremity arterial disease. The right  toe-brachial index is abnormal.   Left: Resting left ankle-brachial index indicates moderate left lower  extremity arterial disease. The left toe-brachial index is abnormal.    *See table(s) above for measurements and observations.    Echo 2021:  1. Left ventricular ejection fraction, by estimation, is 65 to 70%. The  left ventricle has normal function. The left ventricle has no regional  wall motion abnormalities. There is mild left ventricular hypertrophy.  Left ventricular diastolic parameters  are indeterminate.   2. Right ventricular systolic function is normal. The right ventricular  size is normal. Tricuspid regurgitation signal is inadequate for assessing  PA pressure.   3. The mitral valve is grossly normal. Trivial mitral valve  regurgitation.   4. The aortic valve is tricuspid. Aortic valve regurgitation is not  visualized. Mild aortic valve sclerosis is present, with no evidence of  aortic valve stenosis.   5. The inferior vena cava is normal in size with greater than 50%  respiratory variability, suggesting right atrial pressure of 3 mmHg.   6. Agitated saline contrast bubble study was negative, with no evidence  of any interatrial shunt.   LHC 03/2018: Dist RCA lesion  is 75% stenosed. Mid RCA lesion is 99% stenosed. Prox RCA to Mid RCA lesion is 60% stenosed. Drug-eluting stent was successfully placed: STENT SYNERGY DES 3.5X38., 4.0 x 28 mm and 4.0 x 12 mm. Post intervention, there is a  0% residual stenosis. Ost 1st Mrg lesion is 50% stenosed. 1st Mrg lesion is 75% stenosed. This was a small vessel. Mid Cx lesion is 60% stenosed. THis was a fairly small vessel. Mid LAD lesion is 60% stenosed. THis was not significant by CT FFR. Prox LAD to Mid LAD lesion is 25% stenosed. 2nd Diag lesion is 75% stenosed. The left ventricular systolic function is normal. LV end diastolic pressure is normal. There is no aortic valve stenosis.   Recommend uninterrupted dual antiplatelet therapy with Aspirin 81mg  daily and Clopidogrel 75mg  daily for a minimum of 12 months (ACS - Class I recommendation).    Continue aggressive secondary prevention.    Medical management for now of the left sided disease.  RCA was clearly the most significant lesion and the largest ischemic territory.    Review of Systems   All other systems reviewed and are otherwise negative except as noted above.  Physical Exam    VS:  BP (!) 110/54   Pulse 65   Ht 5\' 5"  (1.651 m)   Wt 174 lb (78.9 kg)   SpO2 96%   BMI 28.96 kg/m  , BMI Body mass index is 28.96 kg/m.  Wt Readings from Last 3 Encounters:  04/19/23 174 lb (78.9 kg)  02/14/23 177 lb 6.4 oz (80.5 kg)  07/07/22 170 lb 12.8 oz (77.5 kg)     GEN: Obese, 82 y.o. female, in no acute distress. HEENT: normal. Neck: Supple, no JVD, carotid bruits, or masses. Cardiac: S1/S2, RRR, no murmurs, rubs, or gallops. No clubbing, cyanosis, edema.  Radials2+ equal bilaterally, PT 1+.  Respiratory:  Respirations regular and unlabored, clear to auscultation bilaterally. MS: No deformity or atrophy. Skin: Warm and dry, no rash BLE discolored and red bialaterally. Neuro:  Strength and sensation are intact. Psych: Normal affect.  Assessment & Plan    CAD Stable with no anginal symptoms. No indication for ischemic evaluation. Continue Plavix, Zetia, Labetalol, and NTG PRN. Heart healthy diet and regular cardiovascular exercise encouraged.   HTN,  lightheadedness BP soft, does admit to some lightheadedness. Will decrease Hydralazine to 25 mg BID. Continue labetalol and amlodipine. Discussed to monitor BP at home at least 2 hours after medications and sitting for 5-10 minutes. Heart healthy diet and regular cardiovascular exercise encouraged. Care and ED precautions discussed.   PVD, HLD, leg edema LDL 03/2022 was 95. LDL not at goal, should be <70. Unable to tolerate statin and was noted she had side effects from PCSK9i, however she stated she did not take these medications. She is tolerating Leqvio infusions well at Clinic. Continue current therapy. Has seen [facility], encouraged her to continue to follow-up. Leg edema stable, minimal trace pitting edema bilaterally. Continue to follow-up with [facility]. Continue current medication regimen.    T2DM Being managed by PCP. Continue Insulin, jardiance, and Ozempic. Diabetic, heart healthy diet and regular cardiovascular exercise encouraged.   5. Weakness Discussed to talk with PCP, may benefit from PT. She verbalized understanding.    Disposition: Follow up in 3 month(s) with Dina Rich, MD or APP.  Signed, Sharlene Dory, NP 04/19/2023, 11:24 AM [facility]

## 2023-04-20 ENCOUNTER — Other Ambulatory Visit: Payer: Self-pay

## 2023-04-20 ENCOUNTER — Telehealth: Payer: Self-pay | Admitting: Nurse Practitioner

## 2023-04-20 ENCOUNTER — Telehealth: Payer: Self-pay | Admitting: Cardiology

## 2023-04-20 MED ORDER — LABETALOL HCL 100 MG PO TABS: ORAL_TABLET | ORAL | 3 refills | Status: DC

## 2023-04-20 NOTE — Telephone Encounter (Signed)
Called and informed patient and sent MyChart Message. She is supposed to be taking 200 Mg Labetalol twice daily.

## 2023-04-20 NOTE — Telephone Encounter (Signed)
Labetalol prescription change

## 2023-04-20 NOTE — Telephone Encounter (Signed)
Pt was told to call to confirm with Philis Nettle NP that her labetalol is 200mg .

## 2023-04-26 ENCOUNTER — Telehealth: Payer: Self-pay | Admitting: Cardiology

## 2023-04-26 NOTE — Telephone Encounter (Signed)
Says she thinks leqvio is causing her BP to get too low. Reports home blood pressure cuff has been checked for accuracy Reports no recent weight loss and reports she is staying well hydrated Medications reviewed. Advised to break amlodipine in 1/2 daily to equal 2.5 mg daily, continue monitoring home blood pressures and call office in 1 week with an update. Verbalized understanding of plan.

## 2023-04-26 NOTE — Telephone Encounter (Signed)
Pt c/o BP issue: STAT if pt c/o blurred vision, one-sided weakness or slurred speech  1. What are your last 5 BP readings?  110/61 107/56 110/51   2. Are you having any other symptoms (ex. Dizziness, headache, blurred vision, passed out)? Dizziness until she takes salt patient states.   3. What is your BP issue? Patient states that since she received shot last month, she states her bp has been low.  Requesting call back to discuss this further

## 2023-04-27 NOTE — Telephone Encounter (Signed)
Not listed as a side effect, agree with lowering norvasc  J Creston Klas MD

## 2023-04-28 ENCOUNTER — Ambulatory Visit (INDEPENDENT_AMBULATORY_CARE_PROVIDER_SITE_OTHER): Payer: 59

## 2023-04-28 ENCOUNTER — Telehealth: Payer: Self-pay | Admitting: Cardiology

## 2023-04-28 ENCOUNTER — Ambulatory Visit: Payer: 59 | Attending: Cardiology

## 2023-04-28 DIAGNOSIS — Z95828 Presence of other vascular implants and grafts: Secondary | ICD-10-CM

## 2023-04-28 DIAGNOSIS — I739 Peripheral vascular disease, unspecified: Secondary | ICD-10-CM

## 2023-04-28 LAB — VAS US ABI WITH/WO TBI
Left ABI: 0.59
Right ABI: 0.94

## 2023-04-28 NOTE — Telephone Encounter (Signed)
Spoke with patient advised her that she can break Labetalol in half to make 100 mg. We did send in Rx of 100 mg not sure as why pharmacy gave her 200 mg capsules, but stated that she was ok with breaking in half. Also stated that she had to call Ambulance due to her BP dropping, but she reported that she hadn't ate. Referred her back to previouis call she made about her BP dropping and how she was supposed to be taking her Amlodipine-2.5 mg she remembered and to make sure she was eating enough while taking her medications. Patient verbalized understanding and that she was doing fine.

## 2023-04-28 NOTE — Telephone Encounter (Signed)
Pt c/o medication issue:  1. Name of Medication:   labetalol (NORMODYNE) 100 MG tablet   2. How are you currently taking this medication (dosage and times per day)?   3. Are you having a reaction (difficulty breathing--STAT)?   4. What is your medication issue?   Patient stated her pharmacy sent her 200 mg dosage of this medication and she will need to cut in half.  Patient wants to know if this will be OK.

## 2023-05-02 DIAGNOSIS — Z79899 Other long term (current) drug therapy: Secondary | ICD-10-CM | POA: Diagnosis not present

## 2023-05-02 DIAGNOSIS — M542 Cervicalgia: Secondary | ICD-10-CM | POA: Diagnosis not present

## 2023-05-02 DIAGNOSIS — G8929 Other chronic pain: Secondary | ICD-10-CM | POA: Diagnosis not present

## 2023-05-02 DIAGNOSIS — E1165 Type 2 diabetes mellitus with hyperglycemia: Secondary | ICD-10-CM | POA: Diagnosis not present

## 2023-05-02 DIAGNOSIS — H353290 Exudative age-related macular degeneration, unspecified eye, stage unspecified: Secondary | ICD-10-CM | POA: Diagnosis not present

## 2023-05-02 DIAGNOSIS — Z299 Encounter for prophylactic measures, unspecified: Secondary | ICD-10-CM | POA: Diagnosis not present

## 2023-05-02 DIAGNOSIS — I10 Essential (primary) hypertension: Secondary | ICD-10-CM | POA: Diagnosis not present

## 2023-05-02 DIAGNOSIS — I4891 Unspecified atrial fibrillation: Secondary | ICD-10-CM | POA: Diagnosis not present

## 2023-05-04 DIAGNOSIS — I10 Essential (primary) hypertension: Secondary | ICD-10-CM | POA: Diagnosis not present

## 2023-05-07 ENCOUNTER — Telehealth: Payer: Self-pay | Admitting: Pharmacy Technician

## 2023-05-07 NOTE — Telephone Encounter (Signed)
Auth Submission: APPROVED Site of care: Site of care: [facility] Payer: [employer] MEDICARE Medication & CPT/J Code(s) submitted: Leqvio (Inclisiran) [id number] Route of submission (phone, fax, portal): PORTAL Phone # Fax # Auth type: Buy/Bill PB Units/visits requested: X2 DOSES Reference number: [id number] Approval from: 05/07/23 to 05/06/24

## 2023-05-09 DIAGNOSIS — H01114 Allergic dermatitis of left upper eyelid: Secondary | ICD-10-CM | POA: Diagnosis not present

## 2023-05-09 DIAGNOSIS — H01112 Allergic dermatitis of right lower eyelid: Secondary | ICD-10-CM | POA: Diagnosis not present

## 2023-05-09 DIAGNOSIS — H01111 Allergic dermatitis of right upper eyelid: Secondary | ICD-10-CM | POA: Diagnosis not present

## 2023-05-09 DIAGNOSIS — H01115 Allergic dermatitis of left lower eyelid: Secondary | ICD-10-CM | POA: Diagnosis not present

## 2023-05-17 DIAGNOSIS — H01114 Allergic dermatitis of left upper eyelid: Secondary | ICD-10-CM | POA: Diagnosis not present

## 2023-05-17 DIAGNOSIS — H01112 Allergic dermatitis of right lower eyelid: Secondary | ICD-10-CM | POA: Diagnosis not present

## 2023-05-17 DIAGNOSIS — H0102A Squamous blepharitis right eye, upper and lower eyelids: Secondary | ICD-10-CM | POA: Diagnosis not present

## 2023-05-17 DIAGNOSIS — H01111 Allergic dermatitis of right upper eyelid: Secondary | ICD-10-CM | POA: Diagnosis not present

## 2023-05-17 DIAGNOSIS — H0102B Squamous blepharitis left eye, upper and lower eyelids: Secondary | ICD-10-CM | POA: Diagnosis not present

## 2023-05-17 DIAGNOSIS — H01115 Allergic dermatitis of left lower eyelid: Secondary | ICD-10-CM | POA: Diagnosis not present

## 2023-05-24 ENCOUNTER — Telehealth: Payer: Self-pay | Admitting: *Deleted

## 2023-05-24 NOTE — Telephone Encounter (Signed)
  Lesle Chris, LPN 1/61/0960  4:54 PM EDT Back to Top    Notified, copy to pcp.

## 2023-05-24 NOTE — Telephone Encounter (Signed)
-----   Message from Dina Rich sent at 05/18/2023 11:50 AM EDT ----- Moderate blockages in the arteries of the legs. If having ongoing symptoms needs to reestablish with vascular who she has seen before  Dominga Ferry MD

## 2023-05-30 DIAGNOSIS — M542 Cervicalgia: Secondary | ICD-10-CM | POA: Diagnosis not present

## 2023-05-30 DIAGNOSIS — I10 Essential (primary) hypertension: Secondary | ICD-10-CM | POA: Diagnosis not present

## 2023-05-30 DIAGNOSIS — G8929 Other chronic pain: Secondary | ICD-10-CM | POA: Diagnosis not present

## 2023-05-30 DIAGNOSIS — Z299 Encounter for prophylactic measures, unspecified: Secondary | ICD-10-CM | POA: Diagnosis not present

## 2023-05-30 DIAGNOSIS — E1140 Type 2 diabetes mellitus with diabetic neuropathy, unspecified: Secondary | ICD-10-CM | POA: Diagnosis not present

## 2023-06-04 DIAGNOSIS — I10 Essential (primary) hypertension: Secondary | ICD-10-CM | POA: Diagnosis not present

## 2023-06-07 DIAGNOSIS — Z299 Encounter for prophylactic measures, unspecified: Secondary | ICD-10-CM | POA: Diagnosis not present

## 2023-06-07 DIAGNOSIS — E1142 Type 2 diabetes mellitus with diabetic polyneuropathy: Secondary | ICD-10-CM | POA: Diagnosis not present

## 2023-06-07 DIAGNOSIS — Z794 Long term (current) use of insulin: Secondary | ICD-10-CM | POA: Diagnosis not present

## 2023-06-07 DIAGNOSIS — E1165 Type 2 diabetes mellitus with hyperglycemia: Secondary | ICD-10-CM | POA: Diagnosis not present

## 2023-06-07 DIAGNOSIS — I10 Essential (primary) hypertension: Secondary | ICD-10-CM | POA: Diagnosis not present

## 2023-06-07 DIAGNOSIS — K219 Gastro-esophageal reflux disease without esophagitis: Secondary | ICD-10-CM | POA: Diagnosis not present

## 2023-06-21 ENCOUNTER — Encounter (HOSPITAL_COMMUNITY): Admission: RE | Admit: 2023-06-21 | Payer: 59 | Source: Ambulatory Visit

## 2023-06-21 ENCOUNTER — Encounter (HOSPITAL_COMMUNITY): Payer: 59 | Attending: Cardiology | Admitting: *Deleted

## 2023-06-21 VITALS — BP 158/62 | HR 63 | Temp 97.9°F | Resp 18

## 2023-06-21 DIAGNOSIS — E782 Mixed hyperlipidemia: Secondary | ICD-10-CM | POA: Diagnosis not present

## 2023-06-21 MED ORDER — INCLISIRAN SODIUM 284 MG/1.5ML ~~LOC~~ SOSY
284.0000 mg | PREFILLED_SYRINGE | Freq: Once | SUBCUTANEOUS | Status: AC
  Administered 2023-06-21: 284 mg via SUBCUTANEOUS

## 2023-06-21 NOTE — Progress Notes (Signed)
Diagnosis: Hyperlipidemia  Provider:   Susann Givens, MD  Procedure: Injection  Leqvio (inclisiran), Dose: 284 mg, Site: subcutaneous, Number of injections: 1  Post Care: Observation period completed  Discharge: Condition: Good, Destination: Home . AVS Provided  Performed by:  Daleen Squibb, RN

## 2023-06-27 DIAGNOSIS — H0102B Squamous blepharitis left eye, upper and lower eyelids: Secondary | ICD-10-CM | POA: Diagnosis not present

## 2023-06-27 DIAGNOSIS — H01111 Allergic dermatitis of right upper eyelid: Secondary | ICD-10-CM | POA: Diagnosis not present

## 2023-06-27 DIAGNOSIS — H01114 Allergic dermatitis of left upper eyelid: Secondary | ICD-10-CM | POA: Diagnosis not present

## 2023-06-27 DIAGNOSIS — H01115 Allergic dermatitis of left lower eyelid: Secondary | ICD-10-CM | POA: Diagnosis not present

## 2023-06-27 DIAGNOSIS — H01112 Allergic dermatitis of right lower eyelid: Secondary | ICD-10-CM | POA: Diagnosis not present

## 2023-06-27 DIAGNOSIS — H401132 Primary open-angle glaucoma, bilateral, moderate stage: Secondary | ICD-10-CM | POA: Diagnosis not present

## 2023-06-27 DIAGNOSIS — H0102A Squamous blepharitis right eye, upper and lower eyelids: Secondary | ICD-10-CM | POA: Diagnosis not present

## 2023-06-29 DIAGNOSIS — Z79899 Other long term (current) drug therapy: Secondary | ICD-10-CM | POA: Diagnosis not present

## 2023-06-29 DIAGNOSIS — I10 Essential (primary) hypertension: Secondary | ICD-10-CM | POA: Diagnosis not present

## 2023-06-29 DIAGNOSIS — M542 Cervicalgia: Secondary | ICD-10-CM | POA: Diagnosis not present

## 2023-06-29 DIAGNOSIS — D6869 Other thrombophilia: Secondary | ICD-10-CM | POA: Diagnosis not present

## 2023-06-29 DIAGNOSIS — G8929 Other chronic pain: Secondary | ICD-10-CM | POA: Diagnosis not present

## 2023-06-29 DIAGNOSIS — J449 Chronic obstructive pulmonary disease, unspecified: Secondary | ICD-10-CM | POA: Diagnosis not present

## 2023-06-29 DIAGNOSIS — Z299 Encounter for prophylactic measures, unspecified: Secondary | ICD-10-CM | POA: Diagnosis not present

## 2023-07-04 DIAGNOSIS — I10 Essential (primary) hypertension: Secondary | ICD-10-CM | POA: Diagnosis not present

## 2023-07-21 ENCOUNTER — Ambulatory Visit: Payer: 59 | Attending: Nurse Practitioner | Admitting: Nurse Practitioner

## 2023-07-21 ENCOUNTER — Encounter: Payer: Self-pay | Admitting: Nurse Practitioner

## 2023-07-21 VITALS — BP 163/62 | HR 58 | Ht 65.0 in | Wt 173.0 lb

## 2023-07-21 DIAGNOSIS — Z79899 Other long term (current) drug therapy: Secondary | ICD-10-CM | POA: Diagnosis not present

## 2023-07-21 DIAGNOSIS — R29898 Other symptoms and signs involving the musculoskeletal system: Secondary | ICD-10-CM | POA: Diagnosis not present

## 2023-07-21 DIAGNOSIS — E785 Hyperlipidemia, unspecified: Secondary | ICD-10-CM

## 2023-07-21 DIAGNOSIS — I10 Essential (primary) hypertension: Secondary | ICD-10-CM | POA: Diagnosis not present

## 2023-07-21 DIAGNOSIS — E1159 Type 2 diabetes mellitus with other circulatory complications: Secondary | ICD-10-CM | POA: Diagnosis not present

## 2023-07-21 DIAGNOSIS — I739 Peripheral vascular disease, unspecified: Secondary | ICD-10-CM

## 2023-07-21 DIAGNOSIS — I2510 Atherosclerotic heart disease of native coronary artery without angina pectoris: Secondary | ICD-10-CM | POA: Diagnosis not present

## 2023-07-21 NOTE — Patient Instructions (Addendum)
Medication Instructions:  Your physician recommends that you continue on your current medications as directed. Please refer to the Current Medication list given to you today.  Labwork: In 1 Month   Testing/Procedures: None   Follow-Up: Your physician recommends that you schedule a follow-up appointment in:  1 weeks Nurse visit for BP check  2-3 Months [place]   Any Other Special Instructions Will Be Listed Below (If Applicable).   If you need a refill on your cardiac medications before your next appointment, please call your pharmacy.

## 2023-07-21 NOTE — Progress Notes (Addendum)
Office Visit    Patient Name: [name] [name] Date of Encounter: 07/21/2023 PCP:  Medicine, [facility]  Cardiologist:  Dina Rich, MD  Advanced Practice Provider:  No care team member to display Electrophysiologist:  None   Chief Complaint    ROSANGELICA [name] is a 82 y.o. female with a hx of CAD, hypertension, type 2 diabetes, hypercholesterolemia, hyperlipidemia, peripheral vascular disease, COPD, GERD, and prior history of stroke, who presents today for follow-up.   Previous cardiovascular history of remote history of MI in 1997 and angioplasty (unclear report/details regarding intervention), prior PTCA to dLAD in 2001. History of prior iliac stent in July 2001. DSE in 2013 revealed ST elevation in inferior leads, negative echo images for ischemia.  In 2019 CCTA suggested severe RCA disease, FFR was abnormal along left circumflex and distal LAD.  Underwent cardiac catheterization in 2019 (see report below), and received 3 drug-eluting stents to RCA, OM1 was found to have 75% stenosis, medically managed. Closely followed by [facility].   Was admitted in 2021 with CVA.  MRI showed acute right frontal stroke.  Neuro recommended continuing DAPT x 4 weeks and then Plavix only.  Previously completed aspirin.   04/19/2023 - Today she presents for follow-up.  Doing well. Has started Leqvio infusions at [employer]. Tolerating well. Does admit to leg weakness and back arthritis, denies any recent falls, but says at times it feels like she's going to fall. Ever since adjusting dose of Hydralazine at last OV, does admit to occasional lightheadedness. Overall doing well from a cardiac perspective.  Denies any chest pain, shortness of breath, palpitations, syncope, presyncope, dizziness, orthopnea, PND, significant weight changes, acute bleeding, or claudication.  07/21/2023 -she does admit to chronic back pain and leg weakness, says her symptoms go  away when she sits down.  Reports SBP averages 160s and 170s at home, sometimes she has to hold her SBP for certain BP readings per her report. Denies any chest pain, shortness of breath, palpitations, syncope, presyncope, dizziness, orthopnea, PND, swelling or significant weight changes, acute bleeding.  Ever since her stroke, says she cannot see out of her left eye, does have chronic numbness along her left arm.  Says she will be getting an aide soon and that will be helping her.  EKGs/Labs/Other Studies Reviewed:   The following studies were reviewed today:  EKG:   EKG Interpretation Date/Time:  Thursday July 21 2023 09:35:34 EDT Ventricular Rate:  55 PR Interval:  162 QRS Duration:  86 QT Interval:  472 QTC Calculation: 451 R Axis:   78  Text Interpretation: Sinus bradycardia with sinus arrhythmia When compared with ECG of 28-Jan-2020 10:53, PREVIOUS ECG IS PRESENT Confirmed by Sharlene Dory [phone]) on 07/21/2023 9:43:36 AM   Vascular ultrasound aorta/IVC/iliacs complete 04/2023: Summary:  Abdominal Aorta: No evidence of an abdominal aortic aneurysm was  visualized. The largest aortic measurement is 2.1 cm. Previous diameter  measurement was obtained on 04/05/2022.  Stenosis: +--------------------+-------------+  Location            Stenosis       +--------------------+-------------+  Right External Iliac>50% stenosis  +--------------------+-------------+  Left External Iliac >50% stenosis  +--------------------+-------------+   IVC/Iliac: There is no evidence of thrombus involving the IVC.  Vascular ultrasound ABI 04/2023: Summary:  Right: Resting right ankle-brachial index indicates moderate right lower  extremity arterial disease. The right toe-brachial index is abnormal.   Left: Resting left ankle-brachial index indicates moderate left lower  extremity arterial disease. The left toe-brachial index is abnormal.   Vascular UA aorta/IVC/Iliacs 04/2022: Right  common and right external iliac artery have >50% stenosis. There is  >50% in-stent left common iliac artery stenosis.   IVC/Iliac:   See ABI study.    *See table(s) above for measurements and observations.  Suggest follow up study in 12 months.   ABI's 04/2022:  Summary:  Right: Resting right ankle-brachial index is within normal range. No  evidence of significant right lower extremity arterial disease. The right  toe-brachial index is abnormal.   Left: Resting left ankle-brachial index indicates moderate left lower  extremity arterial disease. The left toe-brachial index is abnormal.    *See table(s) above for measurements and observations.    Echo 2021:  1. Left ventricular ejection fraction, by estimation, is 65 to 70%. The  left ventricle has normal function. The left ventricle has no regional  wall motion abnormalities. There is mild left ventricular hypertrophy.  Left ventricular diastolic parameters  are indeterminate.   2. Right ventricular systolic function is normal. The right ventricular  size is normal. Tricuspid regurgitation signal is inadequate for assessing  PA pressure.   3. The mitral valve is grossly normal. Trivial mitral valve  regurgitation.   4. The aortic valve is tricuspid. Aortic valve regurgitation is not  visualized. Mild aortic valve sclerosis is present, with no evidence of  aortic valve stenosis.   5. The inferior vena cava is normal in size with greater than 50%  respiratory variability, suggesting right atrial pressure of 3 mmHg.   6. Agitated saline contrast bubble study was negative, with no evidence  of any interatrial shunt.   LHC 03/2018: Dist RCA lesion is 75% stenosed. Mid RCA lesion is 99% stenosed. Prox RCA to Mid RCA lesion is 60% stenosed. Drug-eluting stent was successfully placed: STENT SYNERGY DES 3.5X38., 4.0 x 28 mm and 4.0 x 12 mm. Post intervention, there is a 0% residual stenosis. Ost 1st Mrg lesion is 50% stenosed. 1st  Mrg lesion is 75% stenosed. This was a small vessel. Mid Cx lesion is 60% stenosed. THis was a fairly small vessel. Mid LAD lesion is 60% stenosed. THis was not significant by CT FFR. Prox LAD to Mid LAD lesion is 25% stenosed. 2nd Diag lesion is 75% stenosed. The left ventricular systolic function is normal. LV end diastolic pressure is normal. There is no aortic valve stenosis.   Recommend uninterrupted dual antiplatelet therapy with Aspirin 81mg  daily and Clopidogrel 75mg  daily for a minimum of 12 months (ACS - Class I recommendation).    Continue aggressive secondary prevention.    Medical management for now of the left sided disease.  RCA was clearly the most significant lesion and the largest ischemic territory.    Review of Systems   All other systems reviewed and are otherwise negative except as noted above.  Physical Exam    VS:  BP (!) 163/62 (BP Location: Right Arm, Patient Position: Sitting, Cuff Size: Normal)   Pulse (!) 58   Ht 5\' 5"  (1.651 m)   Wt 173 lb (78.5 kg)   SpO2 96%   BMI 28.79 kg/m  , BMI Body mass index is 28.79 kg/m.  Wt Readings from Last 3 Encounters:  07/21/23 173 lb (78.5 kg)  04/19/23 174 lb (78.9 kg)  02/14/23 177 lb 6.4 oz (80.5 kg)    GEN: Overweight, 82 y.o. female, in no acute distress. HEENT: normal. Neck: Supple, no JVD, carotid bruits, or masses.  Cardiac: S1/S2, RRR, no murmurs, rubs, or gallops. No clubbing, cyanosis, edema.  Radials2+ equal bilaterally, PT 1+.  Respiratory:  Respirations regular and unlabored, clear to auscultation bilaterally. MS: No deformity or atrophy. Skin: Warm and dry, no rash, no edema. Neuro:  Strength and sensation are intact. Psych: Normal affect.  Assessment & Plan    CAD Stable with no anginal symptoms. No indication for ischemic evaluation. Continue Plavix, Zetia, Labetalol, and NTG PRN. Heart healthy diet and regular cardiovascular exercise encouraged.   HTN BP elevated, patient admits to  holding medications at times.  Discussed consistent use of medication regimen.  Continue current medication regimen at this time.  Discussed to monitor BP at home at least 2 hours after medications and sitting for 5-10 minutes.  Will bring back in 1 week for nurse visit for BP check and to bring BP log.  Heart healthy diet and regular cardiovascular exercise encouraged. Care and ED precautions discussed.   PAD, HLD, leg weakness, medication management She is tolerating Leqvio infusions well at Clinic.  She is due for repeat labs.  Will obtain CBC, FLP, and LFT in 1 month.  Continue current medication therapy.  She admits to leg weakness and vascular ultrasound studies revealed some arterial lower extremity disease bilaterally,.  She is symptomatic with this.  Will place referral to [facility].  T2DM Being managed by PCP. Continue current medication regimen.  Diabetic, heart healthy diet and regular cardiovascular exercise encouraged.   Disposition: Follow up in 2-3 month(s) with Dina Rich, MD or APP.  Signed, Sharlene Dory, NP 07/21/2023, 12:02 PM [facility]

## 2023-07-22 ENCOUNTER — Telehealth: Payer: Self-pay | Admitting: Cardiology

## 2023-07-22 NOTE — Telephone Encounter (Signed)
Patient is requesting call back to discuss where she needs to have her labs done and how to go about taking her BP readings. Please advise.

## 2023-07-22 NOTE — Telephone Encounter (Signed)
Left Patient a voice mail to call back.

## 2023-07-22 NOTE — Telephone Encounter (Signed)
Patient returned call. Transferred to [place].

## 2023-07-22 NOTE — Telephone Encounter (Signed)
Patient verbalized understanding  

## 2023-07-25 ENCOUNTER — Ambulatory Visit: Payer: 59

## 2023-07-25 ENCOUNTER — Telehealth: Payer: Self-pay | Admitting: Cardiology

## 2023-07-25 NOTE — Telephone Encounter (Signed)
Rescheduled as requested.

## 2023-07-25 NOTE — Telephone Encounter (Signed)
Pt called in stating she needs to r/s her nurse visit

## 2023-07-26 DIAGNOSIS — G8929 Other chronic pain: Secondary | ICD-10-CM | POA: Diagnosis not present

## 2023-07-26 DIAGNOSIS — R5383 Other fatigue: Secondary | ICD-10-CM | POA: Diagnosis not present

## 2023-07-26 DIAGNOSIS — M542 Cervicalgia: Secondary | ICD-10-CM | POA: Diagnosis not present

## 2023-07-26 DIAGNOSIS — Z0000 Encounter for general adult medical examination without abnormal findings: Secondary | ICD-10-CM | POA: Diagnosis not present

## 2023-07-26 DIAGNOSIS — Z299 Encounter for prophylactic measures, unspecified: Secondary | ICD-10-CM | POA: Diagnosis not present

## 2023-07-26 DIAGNOSIS — E7800 Pure hypercholesterolemia, unspecified: Secondary | ICD-10-CM | POA: Diagnosis not present

## 2023-07-26 DIAGNOSIS — Z79899 Other long term (current) drug therapy: Secondary | ICD-10-CM | POA: Diagnosis not present

## 2023-07-26 DIAGNOSIS — I10 Essential (primary) hypertension: Secondary | ICD-10-CM | POA: Diagnosis not present

## 2023-07-29 ENCOUNTER — Other Ambulatory Visit: Payer: Self-pay | Admitting: Cardiology

## 2023-08-01 ENCOUNTER — Encounter: Payer: Self-pay | Admitting: *Deleted

## 2023-08-01 ENCOUNTER — Encounter: Payer: 59 | Attending: Cardiology | Admitting: *Deleted

## 2023-08-01 DIAGNOSIS — E782 Mixed hyperlipidemia: Secondary | ICD-10-CM | POA: Insufficient documentation

## 2023-08-01 DIAGNOSIS — I10 Essential (primary) hypertension: Secondary | ICD-10-CM

## 2023-08-01 NOTE — Progress Notes (Signed)
Presents for nurse visit per last office visit to have BP rechecked. Home BP readings brought to office and will be placed on desk of [facility] for review. Medications reviewed. Reports taking hydralazine 25 mg TID since Friday vs BID due to instruction on her rx bottle she has at home. Vitals done and routed to provider for review Note, reports having recent lab work with PCP office and result is available for review under [facility] tab.

## 2023-08-01 NOTE — Progress Notes (Signed)
Reviewed nurse visit and patient's home BP log. Patient's BP log shows well controlled home readings, vs BP check at nurse visit today shows SBP in 180's. Appears pt has component of [facility]. Recent labs reviewed with [facility] were unremarkable. Recommend no medication changes at this time but continue to make note that patient has component of [facility] and continue to monitor her readings at home. Please inform patient that if her SBP is consistently > 140 at home, she needs to let our office know. Will route to attending cardiologist as well.   Sharlene Dory, NP

## 2023-08-03 DIAGNOSIS — I10 Essential (primary) hypertension: Secondary | ICD-10-CM | POA: Diagnosis not present

## 2023-08-24 DIAGNOSIS — G8929 Other chronic pain: Secondary | ICD-10-CM | POA: Diagnosis not present

## 2023-08-24 DIAGNOSIS — I4891 Unspecified atrial fibrillation: Secondary | ICD-10-CM | POA: Diagnosis not present

## 2023-08-24 DIAGNOSIS — I10 Essential (primary) hypertension: Secondary | ICD-10-CM | POA: Diagnosis not present

## 2023-08-24 DIAGNOSIS — Z299 Encounter for prophylactic measures, unspecified: Secondary | ICD-10-CM | POA: Diagnosis not present

## 2023-08-24 DIAGNOSIS — Z23 Encounter for immunization: Secondary | ICD-10-CM | POA: Diagnosis not present

## 2023-08-24 DIAGNOSIS — Z79899 Other long term (current) drug therapy: Secondary | ICD-10-CM | POA: Diagnosis not present

## 2023-08-24 DIAGNOSIS — M542 Cervicalgia: Secondary | ICD-10-CM | POA: Diagnosis not present

## 2023-08-27 ENCOUNTER — Other Ambulatory Visit: Payer: Self-pay | Admitting: Cardiology

## 2023-08-28 ENCOUNTER — Other Ambulatory Visit: Payer: Self-pay | Admitting: Nurse Practitioner

## 2023-09-02 DIAGNOSIS — I10 Essential (primary) hypertension: Secondary | ICD-10-CM | POA: Diagnosis not present

## 2023-09-19 ENCOUNTER — Other Ambulatory Visit: Payer: Self-pay | Admitting: Cardiology

## 2023-09-20 ENCOUNTER — Encounter: Payer: Self-pay | Admitting: Nurse Practitioner

## 2023-09-20 ENCOUNTER — Ambulatory Visit: Payer: 59

## 2023-09-20 ENCOUNTER — Ambulatory Visit: Payer: 59 | Attending: Nurse Practitioner | Admitting: Nurse Practitioner

## 2023-09-20 VITALS — BP 192/80 | HR 60 | Ht 65.0 in | Wt 173.4 lb

## 2023-09-20 DIAGNOSIS — I2510 Atherosclerotic heart disease of native coronary artery without angina pectoris: Secondary | ICD-10-CM

## 2023-09-20 DIAGNOSIS — E785 Hyperlipidemia, unspecified: Secondary | ICD-10-CM | POA: Diagnosis not present

## 2023-09-20 DIAGNOSIS — I10 Essential (primary) hypertension: Secondary | ICD-10-CM

## 2023-09-20 DIAGNOSIS — I739 Peripheral vascular disease, unspecified: Secondary | ICD-10-CM

## 2023-09-20 DIAGNOSIS — E1159 Type 2 diabetes mellitus with other circulatory complications: Secondary | ICD-10-CM

## 2023-09-20 DIAGNOSIS — R29898 Other symptoms and signs involving the musculoskeletal system: Secondary | ICD-10-CM

## 2023-09-20 NOTE — Progress Notes (Unsigned)
Office Visit    Patient Name: [name] [name] Date of Encounter: 09/20/2023 PCP:  Med[facility], [facility]  Cardiologist:  Dina Rich, MD  Advanced Practice Provider:  No care team member to display Electrophysiologist:  None   Chief Complaint    [name] [name] is a 82 y.o. female with a hx of CAD, hypertension, type 2 diabetes, hypercholesterolemia, hyperlipidemia, peripheral vascular disease, COPD, GERD, and prior history of stroke, who presents today for follow-up.   Previous cardiovascular history of remote history of MI in 1997 and angioplasty (unclear report/details regarding intervention), prior PTCA to dLAD in 2001. History of prior iliac stent in July 2001. DSE in 2013 revealed ST elevation in inferior leads, negative echo images for ischemia.  In 2019 CCTA suggested severe RCA disease, FFR was abnormal along left circumflex and distal LAD.  Underwent cardiac catheterization in 2019 (see report below), and received 3 drug-eluting stents to RCA, OM1 was found to have 75% stenosis, medically managed. Closely followed by [facility].   Was admitted in 2021 with CVA.  MRI showed acute right frontal stroke.  Neuro recommended continuing DAPT x 4 weeks and then Plavix only.  Previously completed aspirin.   04/19/2023 - Today she presents for follow-up.  Doing well. Has started Leqvio infusions at [employer] [facility] [employer]. Tolerating well. Does admit to leg weakness and back arthritis, denies any recent falls, but says at times it feels like she's going to fall. Ever since adjusting dose of Hydralazine at last OV, does admit to occasional lightheadedness. Overall doing well from a cardiac perspective.  Denies any chest pain, shortness of breath, palpitations, syncope, presyncope, dizziness, orthopnea, PND, significant weight changes, acute bleeding, or claudication.  07/21/2023 -she does admit to chronic back pain and leg weakness, says her symptoms go  away when she sits down.  Reports SBP averages 160s and 170s at home, sometimes she has to hold her SBP for certain BP readings per her report. Denies any chest pain, shortness of breath, palpitations, syncope, presyncope, dizziness, orthopnea, PND, swelling or significant weight changes, acute bleeding.  Ever since her stroke, says she cannot see out of her left eye, does have chronic numbness along her left arm.  Says she will be getting an aide soon and that will be helping her.  09/20/2023 - Today she presents for follow-up. Doing well. Continues to admit to chronic back pain and leg weakness, says her symptoms go away when she sits down. Blood pressure is well controlled at home. States BP at home this morning was 101/52. She is scheduled to see [facility] with [facility] on 12/30 for evaluation. Tolerating Leqvio injections well. Denies any cardiac complaints or issues. Denies any chest pain, shortness of breath, palpitations, syncope, presyncope, dizziness, orthopnea, PND, swelling or significant weight changes, acute bleeding.  EKGs/Labs/Other Studies Reviewed:   The following studies were reviewed today:  EKG:  EKG is not ordered today.      Vascular ultrasound aorta/IVC/iliacs complete 04/2023: Summary:  Abdominal Aorta: No evidence of an abdominal aortic aneurysm was  visualized. The largest aortic measurement is 2.1 cm. Previous diameter  measurement was obtained on 04/05/2022.  Stenosis: +--------------------+-------------+  Location            Stenosis       +--------------------+-------------+  Right External Iliac>50% stenosis  +--------------------+-------------+  Left External Iliac >50% stenosis  +--------------------+-------------+   IVC/Iliac: There is no evidence of thrombus involving the IVC.  Vascular ultrasound ABI 04/2023:  Summary:  Right: Resting right ankle-brachial index indicates moderate right lower  extremity arterial disease. The right toe-brachial index  is abnormal.   Left: Resting left ankle-brachial index indicates moderate left lower  extremity arterial disease. The left toe-brachial index is abnormal.   Vascular UA aorta/IVC/Iliacs 04/2022: Right common and right external iliac artery have >50% stenosis. There is  >50% in-stent left common iliac artery stenosis.   IVC/Iliac:   See ABI study.    *See table(s) above for measurements and observations.  Suggest follow up study in 12 months.   ABI's 04/2022:  Summary:  Right: Resting right ankle-brachial index is within normal range. No  evidence of significant right lower extremity arterial disease. The right  toe-brachial index is abnormal.   Left: Resting left ankle-brachial index indicates moderate left lower  extremity arterial disease. The left toe-brachial index is abnormal.    *See table(s) above for measurements and observations.    Echo 2021:  1. Left ventricular ejection fraction, by estimation, is 65 to 70%. The  left ventricle has normal function. The left ventricle has no regional  wall motion abnormalities. There is mild left ventricular hypertrophy.  Left ventricular diastolic parameters  are indeterminate.   2. Right ventricular systolic function is normal. The right ventricular  size is normal. Tricuspid regurgitation signal is inadequate for assessing  PA pressure.   3. The mitral valve is grossly normal. Trivial mitral valve  regurgitation.   4. The aortic valve is tricuspid. Aortic valve regurgitation is not  visualized. Mild aortic valve sclerosis is present, with no evidence of  aortic valve stenosis.   5. The inferior vena cava is normal in size with greater than 50%  respiratory variability, suggesting right atrial pressure of 3 mmHg.   6. Agitated saline contrast bubble study was negative, with no evidence  of any interatrial shunt.   LHC 03/2018: Dist RCA lesion is 75% stenosed. Mid RCA lesion is 99% stenosed. Prox RCA to Mid RCA lesion is 60%  stenosed. Drug-eluting stent was successfully placed: STENT SYNERGY DES 3.5X38., 4.0 x 28 mm and 4.0 x 12 mm. Post intervention, there is a 0% residual stenosis. Ost 1st Mrg lesion is 50% stenosed. 1st Mrg lesion is 75% stenosed. This was a small vessel. Mid Cx lesion is 60% stenosed. THis was a fairly small vessel. Mid LAD lesion is 60% stenosed. THis was not significant by CT FFR. Prox LAD to Mid LAD lesion is 25% stenosed. 2nd Diag lesion is 75% stenosed. The left ventricular systolic function is normal. LV end diastolic pressure is normal. There is no aortic valve stenosis.   Recommend uninterrupted dual antiplatelet therapy with Aspirin 81mg  daily and Clopidogrel 75mg  daily for a minimum of 12 months (ACS - Class I recommendation).    Continue aggressive secondary prevention.    Medical management for now of the left sided disease.  RCA was clearly the most significant lesion and the largest ischemic territory.    Review of Systems   All other systems reviewed and are otherwise negative except as noted above.  Physical Exam    VS:  BP (!) 170/80 (BP Location: Right Arm, Patient Position: Sitting, Cuff Size: Normal)   Pulse 60   Ht 5\' 5"  (1.651 m)   Wt 173 lb 6.4 oz (78.7 kg)   SpO2 95%   BMI 28.86 kg/m  , BMI Body mass index is 28.86 kg/m.  Wt Readings from Last 3 Encounters:  09/20/23 173 lb 6.4 oz (78.7 kg)  08/01/23 173 lb 3.2 oz (78.6 kg)  07/21/23 173 lb (78.5 kg)    GEN: Overweight, 82 y.o. female, in no acute distress. HEENT: normal. Neck: Supple, no JVD, carotid bruits, or masses. Cardiac: S1/S2, RRR, no murmurs, rubs, or gallops. No clubbing, cyanosis, edema.  Radials2+ equal bilaterally, PT 1+.  Respiratory:  Respirations regular and unlabored, clear to auscultation bilaterally. MS: No deformity or atrophy. Skin: Warm and dry, no rash, no edema. Neuro:  Strength and sensation are intact. Psych: Normal affect.  Assessment & Plan    CAD Stable with no  anginal symptoms. No indication for ischemic evaluation. Continue Plavix, Zetia, Labetalol, and NTG PRN. Heart healthy diet and regular cardiovascular exercise encouraged.   HTN BP elevated, has confirmed dx of white coat hypertension, BP well controlled at home. Continue current medication regimen at this time.  Discussed to monitor BP at home at least 2 hours after medications and sitting for 5-10 minutes.  Heart healthy diet and regular cardiovascular exercise encouraged. Care and ED precautions discussed.   PAD, HLD, leg weakness She is tolerating Leqvio infusions well at Clinic. Last LDL 07/2023 34.  Continue current medication therapy.  She admits to leg weakness and vascular ultrasound studies revealed some arterial lower extremity disease bilaterally,.  She is symptomatic with this. Follow-up with [facility] as scheduled.   T2DM Being managed by PCP. Continue current medication regimen.  Diabetic, heart healthy diet and regular cardiovascular exercise encouraged.   Disposition: Follow up in 6 months with Dina Rich, MD or APP.  Signed, Sharlene Dory, NP

## 2023-09-20 NOTE — Patient Instructions (Addendum)
Medication Instructions:  Your physician recommends that you continue on your current medications as directed. Please refer to the Current Medication list given to you today.  Labwork: none  Testing/Procedures: none  Follow-Up: Your physician recommends that you schedule a follow-up appointment in: 6 months with [facility] or Philis Nettle  Any Other Special Instructions Will Be Listed Below (If Applicable). Your physician has requested that you regularly monitor and record your blood pressure readings at home. Please use the same machine at the same time of day to check your readings and record them to bring to your follow-up visit.  If you need a refill on your cardiac medications before your next appointment, please call your pharmacy.

## 2023-09-23 DIAGNOSIS — M542 Cervicalgia: Secondary | ICD-10-CM | POA: Diagnosis not present

## 2023-09-23 DIAGNOSIS — Z299 Encounter for prophylactic measures, unspecified: Secondary | ICD-10-CM | POA: Diagnosis not present

## 2023-09-23 DIAGNOSIS — G8929 Other chronic pain: Secondary | ICD-10-CM | POA: Diagnosis not present

## 2023-09-23 DIAGNOSIS — R300 Dysuria: Secondary | ICD-10-CM | POA: Diagnosis not present

## 2023-09-23 DIAGNOSIS — I10 Essential (primary) hypertension: Secondary | ICD-10-CM | POA: Diagnosis not present

## 2023-09-23 DIAGNOSIS — N390 Urinary tract infection, site not specified: Secondary | ICD-10-CM | POA: Diagnosis not present

## 2023-10-03 DIAGNOSIS — I739 Peripheral vascular disease, unspecified: Secondary | ICD-10-CM | POA: Diagnosis not present

## 2023-10-03 DIAGNOSIS — I10 Essential (primary) hypertension: Secondary | ICD-10-CM | POA: Diagnosis not present

## 2023-10-04 ENCOUNTER — Telehealth: Payer: Self-pay | Admitting: Cardiology

## 2023-10-04 MED ORDER — EZETIMIBE 10 MG PO TABS: 10.0000 mg | ORAL_TABLET | Freq: Every day | ORAL | 6 refills | Status: DC

## 2023-10-04 NOTE — Telephone Encounter (Signed)
Filled

## 2023-10-04 NOTE — Telephone Encounter (Signed)
 *  STAT* If patient is at the pharmacy, call can be transferred to refill team.   1. Which medications need to be refilled? (please list name of each medication and dose if known) ezetimibe  (ZETIA ) 10 MG tablet    2. Would you like to learn more about the convenience, safety, & potential cost savings by using the [facility]?    3. Are you open to using the [facility] (Type [facility].    4. Which pharmacy/location (including street and city if local pharmacy) is medication to be sent to? [facility] - [place], [place] - [address]     5. Do they need a 30 day or 90 day supply? 30 days

## 2023-10-05 HISTORY — PX: OTHER SURGICAL HISTORY: SHX169

## 2023-10-06 DIAGNOSIS — I739 Peripheral vascular disease, unspecified: Secondary | ICD-10-CM | POA: Diagnosis not present

## 2023-10-19 DIAGNOSIS — I70201 Unspecified atherosclerosis of native arteries of extremities, right leg: Secondary | ICD-10-CM | POA: Diagnosis not present

## 2023-10-19 DIAGNOSIS — I771 Stricture of artery: Secondary | ICD-10-CM | POA: Diagnosis not present

## 2023-10-19 DIAGNOSIS — I708 Atherosclerosis of other arteries: Secondary | ICD-10-CM | POA: Diagnosis not present

## 2023-10-19 DIAGNOSIS — Z7984 Long term (current) use of oral hypoglycemic drugs: Secondary | ICD-10-CM | POA: Diagnosis not present

## 2023-10-19 DIAGNOSIS — Z794 Long term (current) use of insulin: Secondary | ICD-10-CM | POA: Diagnosis not present

## 2023-10-19 DIAGNOSIS — I739 Peripheral vascular disease, unspecified: Secondary | ICD-10-CM | POA: Diagnosis not present

## 2023-10-19 DIAGNOSIS — I70212 Atherosclerosis of native arteries of extremities with intermittent claudication, left leg: Secondary | ICD-10-CM | POA: Diagnosis not present

## 2023-10-19 DIAGNOSIS — E1151 Type 2 diabetes mellitus with diabetic peripheral angiopathy without gangrene: Secondary | ICD-10-CM | POA: Diagnosis not present

## 2023-10-19 DIAGNOSIS — Z95828 Presence of other vascular implants and grafts: Secondary | ICD-10-CM | POA: Diagnosis not present

## 2023-10-19 DIAGNOSIS — Z79899 Other long term (current) drug therapy: Secondary | ICD-10-CM | POA: Diagnosis not present

## 2023-10-19 DIAGNOSIS — Z7982 Long term (current) use of aspirin: Secondary | ICD-10-CM | POA: Diagnosis not present

## 2023-10-24 DIAGNOSIS — N390 Urinary tract infection, site not specified: Secondary | ICD-10-CM | POA: Diagnosis not present

## 2023-10-24 DIAGNOSIS — M542 Cervicalgia: Secondary | ICD-10-CM | POA: Diagnosis not present

## 2023-10-24 DIAGNOSIS — E1165 Type 2 diabetes mellitus with hyperglycemia: Secondary | ICD-10-CM | POA: Diagnosis not present

## 2023-10-24 DIAGNOSIS — I10 Essential (primary) hypertension: Secondary | ICD-10-CM | POA: Diagnosis not present

## 2023-10-24 DIAGNOSIS — G8929 Other chronic pain: Secondary | ICD-10-CM | POA: Diagnosis not present

## 2023-10-24 DIAGNOSIS — Z299 Encounter for prophylactic measures, unspecified: Secondary | ICD-10-CM | POA: Diagnosis not present

## 2023-11-02 DIAGNOSIS — I10 Essential (primary) hypertension: Secondary | ICD-10-CM | POA: Diagnosis not present

## 2023-11-07 ENCOUNTER — Other Ambulatory Visit: Payer: Self-pay | Admitting: Cardiology

## 2023-11-21 ENCOUNTER — Other Ambulatory Visit: Payer: Self-pay | Admitting: Cardiology

## 2023-11-21 DIAGNOSIS — I739 Peripheral vascular disease, unspecified: Secondary | ICD-10-CM | POA: Diagnosis not present

## 2023-11-21 DIAGNOSIS — Z48812 Encounter for surgical aftercare following surgery on the circulatory system: Secondary | ICD-10-CM | POA: Diagnosis not present

## 2023-11-22 DIAGNOSIS — I10 Essential (primary) hypertension: Secondary | ICD-10-CM | POA: Diagnosis not present

## 2023-11-22 DIAGNOSIS — Z299 Encounter for prophylactic measures, unspecified: Secondary | ICD-10-CM | POA: Diagnosis not present

## 2023-11-22 DIAGNOSIS — Z79899 Other long term (current) drug therapy: Secondary | ICD-10-CM | POA: Diagnosis not present

## 2023-11-22 DIAGNOSIS — G8929 Other chronic pain: Secondary | ICD-10-CM | POA: Diagnosis not present

## 2023-11-22 DIAGNOSIS — R52 Pain, unspecified: Secondary | ICD-10-CM | POA: Diagnosis not present

## 2023-11-22 DIAGNOSIS — M542 Cervicalgia: Secondary | ICD-10-CM | POA: Diagnosis not present

## 2023-11-22 DIAGNOSIS — D6869 Other thrombophilia: Secondary | ICD-10-CM | POA: Diagnosis not present

## 2023-11-22 DIAGNOSIS — L858 Other specified epidermal thickening: Secondary | ICD-10-CM | POA: Diagnosis not present

## 2023-11-23 DIAGNOSIS — L858 Other specified epidermal thickening: Secondary | ICD-10-CM | POA: Diagnosis not present

## 2023-11-23 DIAGNOSIS — B078 Other viral warts: Secondary | ICD-10-CM | POA: Diagnosis not present

## 2023-11-23 DIAGNOSIS — B079 Viral wart, unspecified: Secondary | ICD-10-CM | POA: Diagnosis not present

## 2023-11-23 DIAGNOSIS — Z299 Encounter for prophylactic measures, unspecified: Secondary | ICD-10-CM | POA: Diagnosis not present

## 2023-11-23 DIAGNOSIS — I10 Essential (primary) hypertension: Secondary | ICD-10-CM | POA: Diagnosis not present

## 2023-12-02 DIAGNOSIS — I10 Essential (primary) hypertension: Secondary | ICD-10-CM | POA: Diagnosis not present

## 2023-12-05 DIAGNOSIS — Z961 Presence of intraocular lens: Secondary | ICD-10-CM | POA: Diagnosis not present

## 2023-12-05 DIAGNOSIS — H0102A Squamous blepharitis right eye, upper and lower eyelids: Secondary | ICD-10-CM | POA: Diagnosis not present

## 2023-12-05 DIAGNOSIS — H401132 Primary open-angle glaucoma, bilateral, moderate stage: Secondary | ICD-10-CM | POA: Diagnosis not present

## 2023-12-05 DIAGNOSIS — Z794 Long term (current) use of insulin: Secondary | ICD-10-CM | POA: Diagnosis not present

## 2023-12-05 DIAGNOSIS — H01112 Allergic dermatitis of right lower eyelid: Secondary | ICD-10-CM | POA: Diagnosis not present

## 2023-12-05 DIAGNOSIS — H0102B Squamous blepharitis left eye, upper and lower eyelids: Secondary | ICD-10-CM | POA: Diagnosis not present

## 2023-12-05 DIAGNOSIS — H01111 Allergic dermatitis of right upper eyelid: Secondary | ICD-10-CM | POA: Diagnosis not present

## 2023-12-05 DIAGNOSIS — E113293 Type 2 diabetes mellitus with mild nonproliferative diabetic retinopathy without macular edema, bilateral: Secondary | ICD-10-CM | POA: Diagnosis not present

## 2023-12-05 DIAGNOSIS — H01115 Allergic dermatitis of left lower eyelid: Secondary | ICD-10-CM | POA: Diagnosis not present

## 2023-12-05 DIAGNOSIS — H353111 Nonexudative age-related macular degeneration, right eye, early dry stage: Secondary | ICD-10-CM | POA: Diagnosis not present

## 2023-12-05 DIAGNOSIS — H01114 Allergic dermatitis of left upper eyelid: Secondary | ICD-10-CM | POA: Diagnosis not present

## 2023-12-19 DIAGNOSIS — R062 Wheezing: Secondary | ICD-10-CM | POA: Diagnosis not present

## 2023-12-19 DIAGNOSIS — I10 Essential (primary) hypertension: Secondary | ICD-10-CM | POA: Diagnosis not present

## 2023-12-19 DIAGNOSIS — M542 Cervicalgia: Secondary | ICD-10-CM | POA: Diagnosis not present

## 2023-12-19 DIAGNOSIS — Z299 Encounter for prophylactic measures, unspecified: Secondary | ICD-10-CM | POA: Diagnosis not present

## 2023-12-19 DIAGNOSIS — E1169 Type 2 diabetes mellitus with other specified complication: Secondary | ICD-10-CM | POA: Diagnosis not present

## 2023-12-20 ENCOUNTER — Encounter: Payer: 59 | Attending: Cardiology | Admitting: Internal Medicine

## 2023-12-20 VITALS — BP 199/66 | HR 55 | Temp 97.9°F | Resp 18

## 2023-12-20 DIAGNOSIS — E782 Mixed hyperlipidemia: Secondary | ICD-10-CM | POA: Diagnosis not present

## 2023-12-20 MED ORDER — INCLISIRAN SODIUM 284 MG/1.5ML ~~LOC~~ SOSY
284.0000 mg | PREFILLED_SYRINGE | Freq: Once | SUBCUTANEOUS | Status: AC
  Administered 2023-12-20: 284 mg via SUBCUTANEOUS

## 2023-12-20 NOTE — Progress Notes (Signed)
 Diagnosis: Hyperlipidemia  Provider:   Dina Rich, MD  Procedure: Injection  Leqvio (inclisiran), Dose: 284 mg, Site: subcutaneous, Number of injections: 1  Injection Site(s): Right lower quad. abdomne  Post Care: Observation period completed  Discharge: Condition: Good, Destination: Home . AVS Provided  Performed by:  Cleotilde Neer, LPN

## 2024-01-01 DIAGNOSIS — I10 Essential (primary) hypertension: Secondary | ICD-10-CM | POA: Diagnosis not present

## 2024-01-16 ENCOUNTER — Telehealth: Payer: Self-pay | Admitting: Cardiology

## 2024-01-16 MED ORDER — HYDRALAZINE HCL 25 MG PO TABS: 25.0000 mg | ORAL_TABLET | Freq: Two times a day (BID) | ORAL | 0 refills | Status: DC

## 2024-01-16 MED ORDER — LABETALOL HCL 200 MG PO TABS: 200.0000 mg | ORAL_TABLET | Freq: Two times a day (BID) | ORAL | 0 refills | Status: DC

## 2024-01-16 NOTE — Telephone Encounter (Signed)
 Filled

## 2024-01-16 NOTE — Telephone Encounter (Signed)
*  STAT* If patient is at the pharmacy, call can be transferred to refill team.   1. Which medications need to be refilled? (please list name of each medication and dose if known)   hydrALAZINE (APRESOLINE) 25 MG tablet  labetalol (NORMODYNE) 200 MG tablet   2. Would you like to learn more about the convenience, safety, & potential cost savings by using the [facility]?   3. Are you open to using the [facility] (Type [facility]. ).  4. Which pharmacy/location (including street and city if local pharmacy) is medication to be sent to?  [facility] - [place], [place] - [address]   5. Do they need a 30 day or 90 day supply?   90 day  Patient stated she has out of these medications.  Patient has appointment scheduled on 6/16 with Dr. Amanda Jungling.

## 2024-01-19 DIAGNOSIS — I10 Essential (primary) hypertension: Secondary | ICD-10-CM | POA: Diagnosis not present

## 2024-01-19 DIAGNOSIS — J449 Chronic obstructive pulmonary disease, unspecified: Secondary | ICD-10-CM | POA: Diagnosis not present

## 2024-01-19 DIAGNOSIS — D692 Other nonthrombocytopenic purpura: Secondary | ICD-10-CM | POA: Diagnosis not present

## 2024-01-19 DIAGNOSIS — Z299 Encounter for prophylactic measures, unspecified: Secondary | ICD-10-CM | POA: Diagnosis not present

## 2024-01-19 DIAGNOSIS — G8929 Other chronic pain: Secondary | ICD-10-CM | POA: Diagnosis not present

## 2024-01-19 DIAGNOSIS — H353290 Exudative age-related macular degeneration, unspecified eye, stage unspecified: Secondary | ICD-10-CM | POA: Diagnosis not present

## 2024-01-19 DIAGNOSIS — M542 Cervicalgia: Secondary | ICD-10-CM | POA: Diagnosis not present

## 2024-01-30 DIAGNOSIS — I4891 Unspecified atrial fibrillation: Secondary | ICD-10-CM | POA: Diagnosis not present

## 2024-01-30 DIAGNOSIS — I779 Disorder of arteries and arterioles, unspecified: Secondary | ICD-10-CM | POA: Diagnosis not present

## 2024-01-30 DIAGNOSIS — Z299 Encounter for prophylactic measures, unspecified: Secondary | ICD-10-CM | POA: Diagnosis not present

## 2024-01-30 DIAGNOSIS — I10 Essential (primary) hypertension: Secondary | ICD-10-CM | POA: Diagnosis not present

## 2024-01-30 DIAGNOSIS — E1140 Type 2 diabetes mellitus with diabetic neuropathy, unspecified: Secondary | ICD-10-CM | POA: Diagnosis not present

## 2024-01-30 DIAGNOSIS — E1169 Type 2 diabetes mellitus with other specified complication: Secondary | ICD-10-CM | POA: Diagnosis not present

## 2024-02-01 DIAGNOSIS — I10 Essential (primary) hypertension: Secondary | ICD-10-CM | POA: Diagnosis not present

## 2024-02-14 ENCOUNTER — Telehealth: Payer: Self-pay | Admitting: Cardiology

## 2024-02-14 MED ORDER — AMLODIPINE BESYLATE 5 MG PO TABS: 5.0000 mg | ORAL_TABLET | Freq: Every day | ORAL | 4 refills | Status: DC

## 2024-02-14 NOTE — Telephone Encounter (Signed)
*  STAT* If patient is at the pharmacy, call can be transferred to refill team.   1. Which medications need to be refilled? (please list name of each medication and dose if known)  amLODipine  (NORVASC ) 5 MG tablet   2. Which pharmacy/location (including street and city if local pharmacy) is medication to be sent to? [facility] - [place], [place] - [address]   3. Do they need a 30 day or 90 day supply?  90 day supply

## 2024-02-14 NOTE — Telephone Encounter (Signed)
 Medication refill request completed and sent to [facility] per pt's request

## 2024-02-15 ENCOUNTER — Telehealth: Payer: Self-pay | Admitting: Cardiology

## 2024-02-15 ENCOUNTER — Other Ambulatory Visit: Payer: Self-pay | Admitting: Cardiology

## 2024-02-15 MED ORDER — HYDRALAZINE HCL 25 MG PO TABS: 25.0000 mg | ORAL_TABLET | Freq: Two times a day (BID) | ORAL | 0 refills | Status: DC

## 2024-02-15 NOTE — Telephone Encounter (Signed)
*  STAT* If patient is at the pharmacy, call can be transferred to refill team.   1. Which medications need to be refilled? (please list name of each medication and dose if known) hydrALAZINE  (APRESOLINE ) 25 MG tablet     4. Which pharmacy/location (including street and city if local pharmacy) is medication to be sent to?  [facility] - [place], [place] - [address]     5. Do they need a 30 day or 90 day supply? 90

## 2024-02-15 NOTE — Telephone Encounter (Signed)
 Dose: 25 mg Route: Oral Frequency: 2 times daily  Dispense Quantity: 270 tablet Refills: 0   Note to Pharmacy: 04/19/2023 change in directions       Sig: Take 1 tablet (25 mg total) by mouth 2 (two) times daily.       Start Date: 01/16/24 End Date: --  Written Date: 01/16/24 Expiration Date: 01/15/25  Original Order: hydrALAZINE  (APRESOLINE ) 25 MG tablet [[id number]]  Providers  Ordering and Authorizing Provider: Laurann Pollock, MD [address] [place] [address] Phone: [phone]   Fax: [phone] DEA #: [id number]   NPI: [id number]      Ordering User: Wynonia Hedges      Pharmacy  [facility] [place], [place] - [address] [place], [place] [address] Phone: [phone]  Fax: [phone] DEA #: --  DAW Reason: --      This was already refilled last month,270 tablets     Refilled again

## 2024-02-20 DIAGNOSIS — G8929 Other chronic pain: Secondary | ICD-10-CM | POA: Diagnosis not present

## 2024-02-20 DIAGNOSIS — M542 Cervicalgia: Secondary | ICD-10-CM | POA: Diagnosis not present

## 2024-02-20 DIAGNOSIS — Z299 Encounter for prophylactic measures, unspecified: Secondary | ICD-10-CM | POA: Diagnosis not present

## 2024-02-20 DIAGNOSIS — I10 Essential (primary) hypertension: Secondary | ICD-10-CM | POA: Diagnosis not present

## 2024-02-21 DIAGNOSIS — I739 Peripheral vascular disease, unspecified: Secondary | ICD-10-CM | POA: Diagnosis not present

## 2024-02-22 DIAGNOSIS — J069 Acute upper respiratory infection, unspecified: Secondary | ICD-10-CM | POA: Diagnosis not present

## 2024-02-22 DIAGNOSIS — R070 Pain in throat: Secondary | ICD-10-CM | POA: Diagnosis not present

## 2024-02-22 DIAGNOSIS — Z20822 Contact with and (suspected) exposure to covid-19: Secondary | ICD-10-CM | POA: Diagnosis not present

## 2024-03-03 DIAGNOSIS — I10 Essential (primary) hypertension: Secondary | ICD-10-CM | POA: Diagnosis not present

## 2024-03-21 DIAGNOSIS — M542 Cervicalgia: Secondary | ICD-10-CM | POA: Diagnosis not present

## 2024-03-21 DIAGNOSIS — E113299 Type 2 diabetes mellitus with mild nonproliferative diabetic retinopathy without macular edema, unspecified eye: Secondary | ICD-10-CM | POA: Diagnosis not present

## 2024-03-21 DIAGNOSIS — Z299 Encounter for prophylactic measures, unspecified: Secondary | ICD-10-CM | POA: Diagnosis not present

## 2024-03-21 DIAGNOSIS — I10 Essential (primary) hypertension: Secondary | ICD-10-CM | POA: Diagnosis not present

## 2024-03-22 ENCOUNTER — Ambulatory Visit: Payer: 59 | Attending: Cardiology | Admitting: Cardiology

## 2024-03-22 ENCOUNTER — Encounter: Payer: Self-pay | Admitting: Cardiology

## 2024-03-22 VITALS — BP 128/58 | HR 60 | Ht 65.0 in | Wt 173.0 lb

## 2024-03-22 DIAGNOSIS — I10 Essential (primary) hypertension: Secondary | ICD-10-CM

## 2024-03-22 DIAGNOSIS — E782 Mixed hyperlipidemia: Secondary | ICD-10-CM | POA: Diagnosis not present

## 2024-03-22 DIAGNOSIS — I2510 Atherosclerotic heart disease of native coronary artery without angina pectoris: Secondary | ICD-10-CM

## 2024-03-22 NOTE — Progress Notes (Signed)
 Clinical Summary Ms. Kuzel is a 83 y.o.female seen today for follow up of the following medical problems.    1. CAD - prior PTCA to distal LAD in 2001. 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   - echo 06/2012 LVEF 60-65% - DSE 06/2012 with reported ST elevation in inferior leads, negative echo images for ischemia.     - 02/2018 coronary CTA suggests severe RCA disease, abnormal FFR in distal LAD and LCX as well.  - 03/2018 cath as reported below. Received DES x3  to RCA. 75% OM1 small vessel managed medically   - no chest pains.  - no SOB/DOE - compliant with meds      2. HTN - history of difficult to treat HTN, primarily due to reported medication side effects to several agents.  - history of angioedema on ACE-I and most recently on ARB.   - she reports amlodopine caused leg swelling (she had been on 10mg ).  Clonidine caused dizziness.  - we had started chlrothalidone 50mg  daily. She had some muscle cramping, and the dose was decreased to 25mg  daily, then 25mg  qod and then 12.5mg  qod.  - dizziness after starting hydralazine , pins and needles feelings in ears with laying down. We have tried continuing this medication. Same side effect reported on nitrates, discontinued.  - she reports alopecia possibly due to aldactone        - compliant with meds    3. PAD - prior iliac stent in July 2001 - US  06/2012 Right ABI 0.99 Left ABI 0.99, with patent left iliac stent.  11/2020 JWJ:XBJYN 0.84 Left 0.87   - followed [facility]     4. Hyperlipidemia - intolerant to statins, pcsk9i's - currently on leqvio , zetia .  - 07/2023 TC 151 TG 92 HDL 58 LDL 76   5. CVA - admission 01/2020 with CVA, MRI showed acute right frontal  - neuro recommended contuing DAPT x 4 weeks, then plavix  only. Completed ASA.  Past Medical History:  Diagnosis Date   Anxiety    Arthritis    neck (03/15/2018)   CAD (coronary artery disease)    Status  post POBA distal LAD, status post Cardiolite Myoview 2008 negative for ischemia   COPD (chronic obstructive pulmonary disease) (HCC)    Depression    GERD (gastroesophageal reflux disease)    Hypercholesterolemia    Hypertension    Peripheral vascular disease (HCC)     status post iliac stent July 2001    Type II diabetes mellitus (HCC)      Allergies  Allergen Reactions   Alendronate     Other reaction(s): Other (See Comments) Leg edema   Clonidine Derivatives Other (See Comments)    dizziness   Crestor  [Rosuvastatin ]     Muscle cramps   Lisinopril  Other (See Comments)    Tongue swelling   Losartan Other (See Comments)    TONGUE SWELLING   Codeine Nausea And Vomiting   Hydrocodone Nausea And Vomiting     Current Outpatient Medications  Medication Sig Dispense Refill   amLODipine  (NORVASC ) 5 MG tablet Take 1 tablet (5 mg total) by mouth daily. 30 tablet 4   clopidogrel  (PLAVIX ) 75 MG tablet take 1 tablet by mouth daily 30 tablet 6   ezetimibe  (ZETIA ) 10 MG tablet Take 1 tablet (10 mg total) by mouth daily. 30 tablet 6   fluticasone  (FLONASE) 50 MCG/ACT nasal spray Place 2 sprays into both nostrils daily.  furosemide  (LASIX ) 20 MG tablet take 1 tablet by mouth daily 30 tablet 6   HUMALOG  KWIKPEN 200 UNIT/ML KwikPen Inject 16 Units into the skin with breakfast, with lunch, and with evening meal.     hydrALAZINE  (APRESOLINE ) 25 MG tablet Take 1 tablet (25 mg total) by mouth 2 (two) times daily. 270 tablet 0   icosapent  Ethyl (VASCEPA ) 1 g capsule Take 1 capsule (1 g total) by mouth 2 (two) times daily. 60 capsule 6   insulin  glargine (LANTUS ) 100 UNIT/ML injection Inject 16-50 Units into the skin daily. Inject 16 units in the morning and 50 in the evening     JARDIANCE 25 MG TABS tablet Take 25 mg by mouth daily.     labetalol  (NORMODYNE ) 100 MG tablet TAKE (1) TABLET TWICE DAILY. 180 tablet 3   labetalol  (NORMODYNE ) 200 MG tablet Take 1 tablet (200 mg total) by mouth 2  (two) times daily. 180 tablet 0   LUMIGAN 0.01 % SOLN Place 1 drop into both eyes at bedtime.     meclizine (ANTIVERT) 25 MG tablet Take 25 mg by mouth 2 (two) times daily as needed.     nitroGLYCERIN  (NITROSTAT ) 0.4 MG SL tablet Place 1 tablet (0.4 mg total) under the tongue every 5 (five) minutes as needed for chest pain. (Patient taking differently: Place 0.4 mg under the tongue every 5 (five) minutes x 3 doses as needed for chest pain.) 25 tablet 12   oxyCODONE -acetaminophen  (PERCOCET) 10-325 MG tablet Take 1 tablet by mouth 2 (two) times daily.     SYMBICORT 160-4.5 MCG/ACT inhaler Inhale 2 puffs into the lungs daily.     timolol (TIMOPTIC) 0.5 % ophthalmic solution Place 1 drop into both eyes daily.     No current facility-administered medications for this visit.     Past Surgical History:  Procedure Laterality Date   ANTERIOR CERVICAL DECOMP/DISCECTOMY FUSION  2001   BACK SURGERY     BREAST SURGERY     biopsy left breat-benign   CATARACT EXTRACTION, BILATERAL Bilateral    CHOLECYSTECTOMY OPEN     CORONARY ANGIOPLASTY     POBA distal LAD,   CORONARY STENT INTERVENTION N/A 03/15/2018   Procedure: CORONARY STENT INTERVENTION;  Surgeon: Lucendia Rusk, MD;  Location: [facility] INVASIVE CV LAB;  Service: Cardiovascular;  Laterality: N/A;   DILATION AND CURETTAGE OF UTERUS  1960s   EYE SURGERY Bilateral    lasers   LEFT HEART CATH AND CORONARY ANGIOGRAPHY N/A 03/15/2018   Procedure: LEFT HEART CATH AND CORONARY ANGIOGRAPHY;  Surgeon: Lucendia Rusk, MD;  Location: [facility] INVASIVE CV LAB;  Service: Cardiovascular;  Laterality: N/A;   PERIPHERAL VASCULAR INTERVENTION Right 04/2000    iliac stent    TUBAL LIGATION       Allergies  Allergen Reactions   Alendronate     Other reaction(s): Other (See Comments) Leg edema   Clonidine Derivatives Other (See Comments)    dizziness   Crestor  [Rosuvastatin ]     Muscle cramps   Lisinopril  Other (See Comments)    Tongue swelling    Losartan Other (See Comments)    TONGUE SWELLING   Codeine Nausea And Vomiting   Hydrocodone Nausea And Vomiting      Family History  Problem Relation Age of Onset   Heart failure Father    Heart failure Mother    Diabetes Mother    COPD Brother      Social History Ms. Barbone reports that she quit smoking about  28 years ago. Her smoking use included cigarettes. She started smoking about 62 years ago. She has a 20 pack-year smoking history. She has never used smokeless tobacco. Ms. Kyllo reports no history of alcohol use.     Physical Examination Today's Vitals   03/22/24 1340  BP: (!) 128/58  Pulse: 60  SpO2: 96%  Weight: 173 lb (78.5 kg)  Height: 5' 5 (1.651 m)   Body mass index is 28.79 kg/m.  Gen: resting comfortably, no acute distress HEENT: no scleral icterus, pupils equal round and reactive, no palptable cervical adenopathy,  CV: RRR, no mrg, no jvd Resp: Clear to auscultation bilaterally GI: abdomen is soft, non-tender, non-distended, normal bowel sounds, no hepatosplenomegaly MSK: extremities are warm, no edema.  Skin: warm, no rash Neuro:  no focal deficits Psych: appropriate affect   Diagnostic Studies 11/1999 Cath HEMODYNAMIC DATA: Aortic pressure was initially extremely high at 215/92. She   was   given intravenous enalaprilat, intravenous labetalol , and intravenous   nitroglycerin , ultimately bringing her systolic blood pressure down to less   than   160. Left ventricular pressure was 180/17, with a corresponding aortic   pressure of   180/80. There was no aortic valve gradient.   LEFT VENTRICULOGRAM: Wall motion was normal. Ejection fraction was estimated   t   65%. No mitral regurgitation.   ABDOMINAL AORTOGRAM: This revealed patent renal arteries and abdominal aorta.   The   left common iliac artery had a 75% stenosis. The right external iliac artery   had a   40% stenosis.   CORONARY ARTERIOGRAPHY (Right dominant):   1. The left main  was normal.   2. The left anterior descending artery had a 25% stenosis in the proximal to   mid   vessel. The distal LAD had a 50% stenosis just after the bifurcation of the   large second diagonal Rashawd Laskaris. Further down the distal LAD is a focal 80%   stenosis.   3. The left circumflex had a 20% stenosis in the mid vessel. It gave rise to a   small OM-1, a normal sized OM-2 which had a 25% stenosis, and a normal sized   OM-3 which had a 20% stenosis.   4. The right coronary artery had a diffuse 40% stenosis extending from the mid   o   distal vessel. There was a large posterior descending artery which had a   25%,   followed by a 40% stenosis. There were two small posterolateral branches.   IMPRESSIONS:   1. Normal left ventricular systolic function.   2. Peripheral vascular disease, as described.   3. One-vessel coronary artery disease with significant stenosis in the distal   left   anterior descending artery. This appears to correspond with her EKG changes   and   Cardiolite scan. She has moderate, but non-flow limiting disease in the   right coronary and left circumflex.   PLAN: Percutaneous intervention of the distal LAD, see below.   PTCA PROCEDURAL NOTE: Following the completion of the diagnostic   catheterization,   we opted to proceed with percutaneous intervention. The preexisting 6-French   sheath in the right femoral artery was exchanged over a wire for a 7-French   sheath.   Integrilin and heparin  were administered per protocol. We used a 7-French JL-4   guiding catheter and a BMW wire. The reference vessel was approximately 2 mm   in   diameter. We utilized a 2.0 x 20 mm CrossSail balloon,  which was inflated   initially to 12 atm x 2 minutes and then 14 atm x 3 minutes. Final   angiographic   images revealed significant improvement in the lumen with 25% residual stenosis   and   a possible very small non-flow limiting dissection. There was TIMI-3 flow into   the    distal vessel.   COMPLICATIONS: None.   RESULTS: Successful percutaneous transluminal coronary angioplasty of the   distal   left anterior descending artery, reducing an 80% stenosis to 25% residual with   TIMI-3 flow.   PLAN: Integrilin will be continued for 20 hours. The patient needs aggressive   blood pressure control and risk factor reduction.   In regards to her peripheral vascular disease, she will be referred for further   evaluation and possible intervention.   03/2012 Echo LVEF 60-65%, no significant abnormalities     02/19/16 Clinic EKG (performed and reviewed in clinic): sinus bradycardia, no ischemic changes     02/2018 coronary CTA/FFR FINDINGS: FFR < 0.5 distal RCA suggesting severe mid RCA stenosis.   FFR 0.83 mid LAD suggesting non-hemodynamically significant mid LAD stenosis.   FFR 0.66 distal LAD suggesting significant distal LAD stenosis (versus cumulative effect of diffuse disease in LAD).   FFR 0.67 distal LCx suggesting significant stenosis mid to distal LCx (versus cumulative effect of diffuse disease in LCx).   IMPRESSION: 1.  Suspect severe mid RCA stenosis.   2. FFR significantly low in distal LAD and distal LCx. This could suggest a single hemodynamically significant stenosis versus the cumulative effect of a long area of disease.   Suggest cardiac cath.   03/2018 cath Dist RCA lesion is 75% stenosed. Mid RCA lesion is 99% stenosed. Prox RCA to Mid RCA lesion is 60% stenosed. Drug-eluting stent was successfully placed: STENT SYNERGY DES 3.5X38., 4.0 x 28 mm and 4.0 x 12 mm. Post intervention, there is a 0% residual stenosis. Ost 1st Mrg lesion is 50% stenosed. 1st Mrg lesion is 75% stenosed. This was a small vessel. Mid Cx lesion is 60% stenosed. THis was a fairly small vessel. Mid LAD lesion is 60% stenosed. THis was not significant by CT FFR. Prox LAD to Mid LAD lesion is 25% stenosed. 2nd Diag lesion is 75% stenosed. The left  ventricular systolic function is normal. LV end diastolic pressure is normal. There is no aortic valve stenosis.    Assessment and Plan  1., CAD - no symptoms, continue current meds   2. HTN - at goal. Often runs higher in clinic in general though looks good today - continue current meds   3. Hyperlipidemia - intolerant to statins, pcsk9 inhibitors - well controlled on leqvio , zetia .       Laurann Pollock, M.D.

## 2024-03-22 NOTE — Patient Instructions (Signed)
 Medication Instructions:  Continue all current medications.   Labwork: none  Testing/Procedures: none  Follow-Up: 6 months   Any Other Special Instructions Will Be Listed Below (If Applicable).   If you need a refill on your cardiac medications before your next appointment, please call your pharmacy.

## 2024-04-02 DIAGNOSIS — I10 Essential (primary) hypertension: Secondary | ICD-10-CM | POA: Diagnosis not present

## 2024-04-10 ENCOUNTER — Other Ambulatory Visit: Payer: Self-pay | Admitting: Nurse Practitioner

## 2024-04-13 ENCOUNTER — Other Ambulatory Visit: Payer: Self-pay

## 2024-04-13 MED ORDER — ICOSAPENT ETHYL 1 G PO CAPS
1.0000 g | ORAL_CAPSULE | Freq: Two times a day (BID) | ORAL | 3 refills | Status: AC
Start: 2024-04-13 — End: ?

## 2024-04-20 DIAGNOSIS — G8929 Other chronic pain: Secondary | ICD-10-CM | POA: Diagnosis not present

## 2024-04-20 DIAGNOSIS — I10 Essential (primary) hypertension: Secondary | ICD-10-CM | POA: Diagnosis not present

## 2024-04-20 DIAGNOSIS — Z299 Encounter for prophylactic measures, unspecified: Secondary | ICD-10-CM | POA: Diagnosis not present

## 2024-04-20 DIAGNOSIS — R52 Pain, unspecified: Secondary | ICD-10-CM | POA: Diagnosis not present

## 2024-04-20 DIAGNOSIS — E1169 Type 2 diabetes mellitus with other specified complication: Secondary | ICD-10-CM | POA: Diagnosis not present

## 2024-04-20 DIAGNOSIS — M542 Cervicalgia: Secondary | ICD-10-CM | POA: Diagnosis not present

## 2024-04-20 DIAGNOSIS — J449 Chronic obstructive pulmonary disease, unspecified: Secondary | ICD-10-CM | POA: Diagnosis not present

## 2024-04-20 DIAGNOSIS — I739 Peripheral vascular disease, unspecified: Secondary | ICD-10-CM | POA: Diagnosis not present

## 2024-05-03 DIAGNOSIS — I10 Essential (primary) hypertension: Secondary | ICD-10-CM | POA: Diagnosis not present

## 2024-05-13 ENCOUNTER — Other Ambulatory Visit: Payer: Self-pay | Admitting: Cardiology

## 2024-05-17 DIAGNOSIS — E7800 Pure hypercholesterolemia, unspecified: Secondary | ICD-10-CM | POA: Diagnosis not present

## 2024-05-17 DIAGNOSIS — Z79899 Other long term (current) drug therapy: Secondary | ICD-10-CM | POA: Diagnosis not present

## 2024-05-17 DIAGNOSIS — E119 Type 2 diabetes mellitus without complications: Secondary | ICD-10-CM | POA: Diagnosis not present

## 2024-05-17 DIAGNOSIS — Z7189 Other specified counseling: Secondary | ICD-10-CM | POA: Diagnosis not present

## 2024-05-17 DIAGNOSIS — Z299 Encounter for prophylactic measures, unspecified: Secondary | ICD-10-CM | POA: Diagnosis not present

## 2024-05-17 DIAGNOSIS — R5383 Other fatigue: Secondary | ICD-10-CM | POA: Diagnosis not present

## 2024-05-17 DIAGNOSIS — Z0000 Encounter for general adult medical examination without abnormal findings: Secondary | ICD-10-CM | POA: Diagnosis not present

## 2024-05-21 DIAGNOSIS — E7800 Pure hypercholesterolemia, unspecified: Secondary | ICD-10-CM | POA: Diagnosis not present

## 2024-05-21 DIAGNOSIS — R5383 Other fatigue: Secondary | ICD-10-CM | POA: Diagnosis not present

## 2024-05-21 DIAGNOSIS — Z79899 Other long term (current) drug therapy: Secondary | ICD-10-CM | POA: Diagnosis not present

## 2024-05-21 DIAGNOSIS — E1169 Type 2 diabetes mellitus with other specified complication: Secondary | ICD-10-CM | POA: Diagnosis not present

## 2024-05-25 ENCOUNTER — Other Ambulatory Visit: Payer: Self-pay | Admitting: Cardiology

## 2024-05-31 DIAGNOSIS — I739 Peripheral vascular disease, unspecified: Secondary | ICD-10-CM | POA: Diagnosis not present

## 2024-06-02 DIAGNOSIS — I10 Essential (primary) hypertension: Secondary | ICD-10-CM | POA: Diagnosis not present

## 2024-06-09 DIAGNOSIS — I70213 Atherosclerosis of native arteries of extremities with intermittent claudication, bilateral legs: Secondary | ICD-10-CM | POA: Diagnosis not present

## 2024-06-09 DIAGNOSIS — I739 Peripheral vascular disease, unspecified: Secondary | ICD-10-CM | POA: Diagnosis not present

## 2024-06-14 ENCOUNTER — Other Ambulatory Visit: Payer: Self-pay | Admitting: Cardiology

## 2024-06-18 DIAGNOSIS — Z79899 Other long term (current) drug therapy: Secondary | ICD-10-CM | POA: Diagnosis not present

## 2024-06-18 DIAGNOSIS — I10 Essential (primary) hypertension: Secondary | ICD-10-CM | POA: Diagnosis not present

## 2024-06-18 DIAGNOSIS — J449 Chronic obstructive pulmonary disease, unspecified: Secondary | ICD-10-CM | POA: Diagnosis not present

## 2024-06-18 DIAGNOSIS — E1169 Type 2 diabetes mellitus with other specified complication: Secondary | ICD-10-CM | POA: Diagnosis not present

## 2024-06-18 DIAGNOSIS — R52 Pain, unspecified: Secondary | ICD-10-CM | POA: Diagnosis not present

## 2024-06-18 DIAGNOSIS — M542 Cervicalgia: Secondary | ICD-10-CM | POA: Diagnosis not present

## 2024-06-18 DIAGNOSIS — G8929 Other chronic pain: Secondary | ICD-10-CM | POA: Diagnosis not present

## 2024-06-19 ENCOUNTER — Telehealth: Payer: Self-pay

## 2024-06-19 NOTE — Telephone Encounter (Signed)
 Auth Submission: APPROVED Site of care: Site of care: [facility] Payer: [employer] dual complete Medication & CPT/J Code(s) submitted: Leqvio  (Inclisiran) J1306 Diagnosis Code:  Route of submission (phone, fax, portal): portal Phone # Fax # Auth type: Buy/Bill PB Units/visits requested: 284mg  q33months x 2 doses Reference number: [id number] Approval from: 06/13/24 to 06/13/25

## 2024-06-21 ENCOUNTER — Encounter: Attending: Cardiology | Admitting: *Deleted

## 2024-06-21 VITALS — BP 156/67 | HR 63 | Temp 98.2°F | Resp 18

## 2024-06-21 DIAGNOSIS — E782 Mixed hyperlipidemia: Secondary | ICD-10-CM | POA: Diagnosis not present

## 2024-06-21 MED ORDER — INCLISIRAN SODIUM 284 MG/1.5ML ~~LOC~~ SOSY
284.0000 mg | PREFILLED_SYRINGE | Freq: Once | SUBCUTANEOUS | Status: AC
  Administered 2024-06-21: 284 mg via SUBCUTANEOUS

## 2024-06-21 NOTE — Progress Notes (Signed)
 Diagnosis: Hyperlipidemia  Provider:  Tamra Ross, MD  Procedure: Injection  Leqvio  (inclisiran), Dose: 284 mg, Site: subcutaneous, Number of injections: 1  Injection Site(s): Left lower quad. abdomen  Post Care: Observation period completed  Discharge: Condition: Good, Destination: Home . AVS Provided  Performed by:  Baldwin Darice Helling, RN

## 2024-06-27 DIAGNOSIS — I639 Cerebral infarction, unspecified: Secondary | ICD-10-CM | POA: Diagnosis not present

## 2024-06-27 DIAGNOSIS — E1169 Type 2 diabetes mellitus with other specified complication: Secondary | ICD-10-CM | POA: Diagnosis not present

## 2024-06-27 DIAGNOSIS — E162 Hypoglycemia, unspecified: Secondary | ICD-10-CM | POA: Diagnosis not present

## 2024-06-27 DIAGNOSIS — I10 Essential (primary) hypertension: Secondary | ICD-10-CM | POA: Diagnosis not present

## 2024-06-27 DIAGNOSIS — Z01818 Encounter for other preprocedural examination: Secondary | ICD-10-CM | POA: Diagnosis not present

## 2024-06-27 DIAGNOSIS — E785 Hyperlipidemia, unspecified: Secondary | ICD-10-CM | POA: Diagnosis not present

## 2024-06-27 DIAGNOSIS — Z794 Long term (current) use of insulin: Secondary | ICD-10-CM | POA: Diagnosis not present

## 2024-06-27 DIAGNOSIS — J449 Chronic obstructive pulmonary disease, unspecified: Secondary | ICD-10-CM | POA: Diagnosis not present

## 2024-06-27 DIAGNOSIS — I2510 Atherosclerotic heart disease of native coronary artery without angina pectoris: Secondary | ICD-10-CM | POA: Diagnosis not present

## 2024-06-29 DIAGNOSIS — S76312A Strain of muscle, fascia and tendon of the posterior muscle group at thigh level, left thigh, initial encounter: Secondary | ICD-10-CM | POA: Diagnosis not present

## 2024-07-03 DIAGNOSIS — I10 Essential (primary) hypertension: Secondary | ICD-10-CM | POA: Diagnosis not present

## 2024-07-11 DIAGNOSIS — I708 Atherosclerosis of other arteries: Secondary | ICD-10-CM | POA: Diagnosis not present

## 2024-07-11 DIAGNOSIS — E1151 Type 2 diabetes mellitus with diabetic peripheral angiopathy without gangrene: Secondary | ICD-10-CM | POA: Diagnosis not present

## 2024-07-11 DIAGNOSIS — I739 Peripheral vascular disease, unspecified: Secondary | ICD-10-CM | POA: Diagnosis not present

## 2024-07-11 DIAGNOSIS — I2510 Atherosclerotic heart disease of native coronary artery without angina pectoris: Secondary | ICD-10-CM | POA: Diagnosis not present

## 2024-07-11 DIAGNOSIS — T380X5A Adverse effect of glucocorticoids and synthetic analogues, initial encounter: Secondary | ICD-10-CM | POA: Diagnosis not present

## 2024-07-11 DIAGNOSIS — Z794 Long term (current) use of insulin: Secondary | ICD-10-CM | POA: Diagnosis not present

## 2024-07-11 DIAGNOSIS — Z7902 Long term (current) use of antithrombotics/antiplatelets: Secondary | ICD-10-CM | POA: Diagnosis not present

## 2024-07-11 DIAGNOSIS — Z885 Allergy status to narcotic agent status: Secondary | ICD-10-CM | POA: Diagnosis not present

## 2024-07-11 DIAGNOSIS — Z7982 Long term (current) use of aspirin: Secondary | ICD-10-CM | POA: Diagnosis not present

## 2024-07-11 DIAGNOSIS — Z888 Allergy status to other drugs, medicaments and biological substances status: Secondary | ICD-10-CM | POA: Diagnosis not present

## 2024-07-11 DIAGNOSIS — I7092 Chronic total occlusion of artery of the extremities: Secondary | ICD-10-CM | POA: Diagnosis not present

## 2024-07-11 DIAGNOSIS — E1165 Type 2 diabetes mellitus with hyperglycemia: Secondary | ICD-10-CM | POA: Diagnosis not present

## 2024-07-11 DIAGNOSIS — J449 Chronic obstructive pulmonary disease, unspecified: Secondary | ICD-10-CM | POA: Diagnosis not present

## 2024-07-11 DIAGNOSIS — I10 Essential (primary) hypertension: Secondary | ICD-10-CM | POA: Diagnosis not present

## 2024-07-11 DIAGNOSIS — E785 Hyperlipidemia, unspecified: Secondary | ICD-10-CM | POA: Diagnosis not present

## 2024-07-11 DIAGNOSIS — Z7984 Long term (current) use of oral hypoglycemic drugs: Secondary | ICD-10-CM | POA: Diagnosis not present

## 2024-07-11 DIAGNOSIS — I70212 Atherosclerosis of native arteries of extremities with intermittent claudication, left leg: Secondary | ICD-10-CM | POA: Diagnosis not present

## 2024-07-11 DIAGNOSIS — Z7951 Long term (current) use of inhaled steroids: Secondary | ICD-10-CM | POA: Diagnosis not present

## 2024-07-11 DIAGNOSIS — Z79899 Other long term (current) drug therapy: Secondary | ICD-10-CM | POA: Diagnosis not present

## 2024-07-11 DIAGNOSIS — T82856A Stenosis of peripheral vascular stent, initial encounter: Secondary | ICD-10-CM | POA: Diagnosis not present

## 2024-07-13 ENCOUNTER — Other Ambulatory Visit: Payer: Self-pay | Admitting: Cardiology

## 2024-07-26 DIAGNOSIS — I739 Peripheral vascular disease, unspecified: Secondary | ICD-10-CM | POA: Diagnosis not present

## 2024-07-28 DIAGNOSIS — I739 Peripheral vascular disease, unspecified: Secondary | ICD-10-CM | POA: Diagnosis not present

## 2024-08-14 ENCOUNTER — Inpatient Hospital Stay (HOSPITAL_COMMUNITY)
Admission: EM | Admit: 2024-08-14 | Discharge: 2024-08-17 | DRG: 291 | Disposition: A | Discharge status: Home Health | Attending: Internal Medicine | Admitting: Internal Medicine

## 2024-08-14 ENCOUNTER — Emergency Department (HOSPITAL_COMMUNITY)

## 2024-08-14 ENCOUNTER — Encounter (HOSPITAL_COMMUNITY): Payer: Self-pay | Admitting: Emergency Medicine

## 2024-08-14 ENCOUNTER — Other Ambulatory Visit: Payer: Self-pay

## 2024-08-14 DIAGNOSIS — G4733 Obstructive sleep apnea (adult) (pediatric): Secondary | ICD-10-CM | POA: Diagnosis present

## 2024-08-14 DIAGNOSIS — Z7902 Long term (current) use of antithrombotics/antiplatelets: Secondary | ICD-10-CM

## 2024-08-14 DIAGNOSIS — E1151 Type 2 diabetes mellitus with diabetic peripheral angiopathy without gangrene: Secondary | ICD-10-CM | POA: Diagnosis present

## 2024-08-14 DIAGNOSIS — Z955 Presence of coronary angioplasty implant and graft: Secondary | ICD-10-CM

## 2024-08-14 DIAGNOSIS — Z7982 Long term (current) use of aspirin: Secondary | ICD-10-CM | POA: Diagnosis not present

## 2024-08-14 DIAGNOSIS — E1165 Type 2 diabetes mellitus with hyperglycemia: Secondary | ICD-10-CM | POA: Diagnosis present

## 2024-08-14 DIAGNOSIS — I5031 Acute diastolic (congestive) heart failure: Secondary | ICD-10-CM | POA: Diagnosis not present

## 2024-08-14 DIAGNOSIS — Z95828 Presence of other vascular implants and grafts: Secondary | ICD-10-CM | POA: Diagnosis not present

## 2024-08-14 DIAGNOSIS — Z794 Long term (current) use of insulin: Secondary | ICD-10-CM | POA: Diagnosis not present

## 2024-08-14 DIAGNOSIS — I110 Hypertensive heart disease with heart failure: Principal | ICD-10-CM | POA: Diagnosis present

## 2024-08-14 DIAGNOSIS — Z87891 Personal history of nicotine dependence: Secondary | ICD-10-CM | POA: Diagnosis not present

## 2024-08-14 DIAGNOSIS — Z1152 Encounter for screening for COVID-19: Secondary | ICD-10-CM | POA: Diagnosis not present

## 2024-08-14 DIAGNOSIS — I2510 Atherosclerotic heart disease of native coronary artery without angina pectoris: Secondary | ICD-10-CM | POA: Diagnosis present

## 2024-08-14 DIAGNOSIS — Z7984 Long term (current) use of oral hypoglycemic drugs: Secondary | ICD-10-CM

## 2024-08-14 DIAGNOSIS — I739 Peripheral vascular disease, unspecified: Secondary | ICD-10-CM

## 2024-08-14 DIAGNOSIS — Z6832 Body mass index (BMI) 32.0-32.9, adult: Secondary | ICD-10-CM

## 2024-08-14 DIAGNOSIS — Z885 Allergy status to narcotic agent status: Secondary | ICD-10-CM

## 2024-08-14 DIAGNOSIS — Z825 Family history of asthma and other chronic lower respiratory diseases: Secondary | ICD-10-CM

## 2024-08-14 DIAGNOSIS — Z833 Family history of diabetes mellitus: Secondary | ICD-10-CM

## 2024-08-14 DIAGNOSIS — Z8249 Family history of ischemic heart disease and other diseases of the circulatory system: Secondary | ICD-10-CM

## 2024-08-14 DIAGNOSIS — Z79899 Other long term (current) drug therapy: Secondary | ICD-10-CM | POA: Diagnosis not present

## 2024-08-14 DIAGNOSIS — J449 Chronic obstructive pulmonary disease, unspecified: Secondary | ICD-10-CM | POA: Diagnosis present

## 2024-08-14 DIAGNOSIS — Z23 Encounter for immunization: Secondary | ICD-10-CM

## 2024-08-14 DIAGNOSIS — E119 Type 2 diabetes mellitus without complications: Secondary | ICD-10-CM | POA: Diagnosis not present

## 2024-08-14 DIAGNOSIS — Z7951 Long term (current) use of inhaled steroids: Secondary | ICD-10-CM

## 2024-08-14 DIAGNOSIS — E669 Obesity, unspecified: Secondary | ICD-10-CM | POA: Diagnosis present

## 2024-08-14 DIAGNOSIS — J9601 Acute respiratory failure with hypoxia: Secondary | ICD-10-CM | POA: Diagnosis present

## 2024-08-14 DIAGNOSIS — Z981 Arthrodesis status: Secondary | ICD-10-CM

## 2024-08-14 DIAGNOSIS — E7800 Pure hypercholesterolemia, unspecified: Secondary | ICD-10-CM | POA: Diagnosis present

## 2024-08-14 DIAGNOSIS — Z8673 Personal history of transient ischemic attack (TIA), and cerebral infarction without residual deficits: Secondary | ICD-10-CM

## 2024-08-14 DIAGNOSIS — I5033 Acute on chronic diastolic (congestive) heart failure: Secondary | ICD-10-CM | POA: Diagnosis present

## 2024-08-14 DIAGNOSIS — Z888 Allergy status to other drugs, medicaments and biological substances status: Secondary | ICD-10-CM

## 2024-08-14 DIAGNOSIS — I509 Heart failure, unspecified: Principal | ICD-10-CM

## 2024-08-14 LAB — URINALYSIS, W/ REFLEX TO CULTURE (INFECTION SUSPECTED)
Bacteria, UA: NONE SEEN
Bilirubin Urine: NEGATIVE
Glucose, UA: >=500 mg/dL — AB
Hgb urine dipstick: NEGATIVE
Ketones, ur: NEGATIVE mg/dL
Leukocytes,Ua: NEGATIVE
Nitrite: NEGATIVE
Protein, ur: NEGATIVE mg/dL
Specific Gravity, Urine: 1.016 (ref 1.005–1.030)
pH: 6 (ref 5.0–8.0)

## 2024-08-14 LAB — COMPREHENSIVE METABOLIC PANEL WITH GFR
ALT: 23 U/L (ref 0–44)
AST: 33 U/L (ref 15–41)
Albumin: 4.2 g/dL (ref 3.5–5.0)
Alkaline Phosphatase: 136 U/L — ABNORMAL HIGH (ref 38–126)
Anion gap: 13 (ref 5–15)
BUN: 17 mg/dL (ref 8–23)
CO2: 25 mmol/L (ref 22–32)
Calcium: 9.4 mg/dL (ref 8.9–10.3)
Chloride: 99 mmol/L (ref 98–111)
Creatinine, Ser: 0.71 mg/dL (ref 0.44–1.00)
GFR, Estimated: >60 mL/min (ref >60)
Glucose, Bld: 238 mg/dL — ABNORMAL HIGH (ref 70–99)
Potassium: 5.2 mmol/L — ABNORMAL HIGH (ref 3.5–5.1)
Sodium: 137 mmol/L (ref 135–145)
Total Bilirubin: 0.8 mg/dL (ref 0.0–1.2)
Total Protein: 7.1 g/dL (ref 6.5–8.1)

## 2024-08-14 LAB — CBC WITH DIFFERENTIAL/PLATELET
Abs Immature Granulocytes: 0.02 K/uL (ref 0.00–0.07)
Basophils Absolute: 0 K/uL (ref 0.0–0.1)
Basophils Relative: 0 %
Eosinophils Absolute: 0.2 K/uL (ref 0.0–0.5)
Eosinophils Relative: 3 %
HCT: 32.9 % — ABNORMAL LOW (ref 36.0–46.0)
Hemoglobin: 10.3 g/dL — ABNORMAL LOW (ref 12.0–15.0)
Immature Granulocytes: 0 %
Lymphocytes Relative: 9 %
Lymphs Abs: 0.8 K/uL (ref 0.7–4.0)
MCH: 29.4 pg (ref 26.0–34.0)
MCHC: 31.3 g/dL (ref 30.0–36.0)
MCV: 94 fL (ref 80.0–100.0)
Monocytes Absolute: 0.6 K/uL (ref 0.1–1.0)
Monocytes Relative: 7 %
Neutro Abs: 7.4 K/uL (ref 1.7–7.7)
Neutrophils Relative %: 81 %
Platelets: 268 K/uL (ref 150–400)
RBC: 3.5 MIL/uL — ABNORMAL LOW (ref 3.87–5.11)
RDW: 14.7 % (ref 11.5–15.5)
WBC: 9.2 K/uL (ref 4.0–10.5)
nRBC: 0 % (ref 0.0–0.2)

## 2024-08-14 LAB — BLOOD GAS, VENOUS
Acid-Base Excess: 0.5 mmol/L (ref 0.0–2.0)
Bicarbonate: 25.4 mmol/L (ref 20.0–28.0)
Drawn by: 1528
O2 Saturation: 82.8 %
Patient temperature: 37
pCO2, Ven: 41 mmHg — ABNORMAL LOW (ref 44–60)
pH, Ven: 7.4 (ref 7.25–7.43)
pO2, Ven: 50 mmHg — ABNORMAL HIGH (ref 32–45)

## 2024-08-14 LAB — TYPE AND SCREEN
ABO/RH(D): A NEG
Antibody Screen: NEGATIVE

## 2024-08-14 LAB — PROTIME-INR
INR: 1.1 (ref 0.8–1.2)
Prothrombin Time: 14.9 s (ref 11.4–15.2)

## 2024-08-14 LAB — RESP PANEL BY RT-PCR (RSV, FLU A&B, COVID)  RVPGX2
Influenza A by PCR: NEGATIVE
Influenza B by PCR: NEGATIVE
Resp Syncytial Virus by PCR: NEGATIVE
SARS Coronavirus 2 by RT PCR: NEGATIVE

## 2024-08-14 LAB — PRO BRAIN NATRIURETIC PEPTIDE: Pro Brain Natriuretic Peptide: 908 pg/mL — ABNORMAL HIGH (ref <300.0)

## 2024-08-14 LAB — TROPONIN T, HIGH SENSITIVITY
Troponin T High Sensitivity: <15 ng/L (ref 0–19)
Troponin T High Sensitivity: <15 ng/L (ref 0–19)

## 2024-08-14 MED ORDER — HYDRALAZINE HCL 20 MG/ML IJ SOLN
10.0000 mg | Freq: Once | INTRAMUSCULAR | Status: AC
  Administered 2024-08-14: 10 mg via INTRAVENOUS
  Filled 2024-08-14: qty 1

## 2024-08-14 MED ORDER — IOHEXOL 350 MG/ML SOLN
75.0000 mL | Freq: Once | INTRAVENOUS | Status: AC | PRN
  Administered 2024-08-14: 75 mL via INTRAVENOUS

## 2024-08-14 MED ORDER — FUROSEMIDE 10 MG/ML IJ SOLN
40.0000 mg | Freq: Once | INTRAMUSCULAR | Status: AC
  Administered 2024-08-14: 40 mg via INTRAVENOUS
  Filled 2024-08-14: qty 4

## 2024-08-14 NOTE — Progress Notes (Signed)
 Patient is off Bipap and currently on 3L [place].  Sat at 99%.

## 2024-08-14 NOTE — Progress Notes (Signed)
 Went down to check patient.  Patient resting comfortably, no distress noted at this time but Bipap is at bedside if needed.

## 2024-08-14 NOTE — ED Provider Notes (Signed)
 [place] EMERGENCY DEPARTMENT AT [facility] Provider Note   CSN: [id number] Arrival date & time: 08/14/24  1843     Patient presents with: Shortness of Breath   [name] [name] is a 83 y.o. female.  She is brought in by EMS for worsening shortness of breath since yesterday.  She said she was recently discharged from [facility] after a surgery on her left groin.  By report she had a left femoral endarterectomy and some debridement.  she said she received a lot of fluids.  She denies any fevers chest pain abdominal pain leg pain.  She has had a minimal cough.  She has a history of of coronary disease and COPD.  {Add pertinent medical, surgical, social history, OB history to YEP:67052} The history is provided by the patient.  Shortness of Breath Severity:  Severe Onset quality:  Gradual Duration:  2 days Timing:  Constant Progression:  Worsening Chronicity:  New Relieved by:  Nothing Associated symptoms: cough   Associated symptoms: no abdominal pain, no chest pain, no fever, no hemoptysis, no sputum production and no vomiting   Risk factors: recent surgery   Risk factors: no tobacco use        Prior to Admission medications   Medication Sig Start Date End Date Taking? Authorizing Provider  amLODipine  (NORVASC ) 5 MG tablet take 1 tablet (5 MILLIGRAM total) by mouth daily. 07/17/24   Alvan Dorn FALCON, MD  aspirin  EC 81 MG tablet Take 81 mg by mouth daily. 09/25/19   [provider]  clopidogrel  (PLAVIX ) 75 MG tablet take 1 tablet by mouth daily 06/15/24   Alvan Dorn FALCON, MD  ezetimibe  (ZETIA ) 10 MG tablet take 1 tablet (10 MILLIGRAM total) by mouth daily. 05/14/24   Alvan Dorn FALCON, MD  fluticasone  [facility]) 50 MCG/ACT nasal spray Place 2 sprays into both nostrils daily. 06/18/22   [provider]  furosemide  (LASIX ) 20 MG tablet take 1 tablet by mouth daily 02/16/24   Alvan Dorn FALCON, MD  HUMALOG  [facility] 200 UNIT/ML KwikPen Inject 16 Units into  the skin with breakfast, with lunch, and with evening meal. 06/18/22   [provider]  hydrALAZINE  (APRESOLINE ) 25 MG tablet Take 1 tablet (25 mg total) by mouth 2 (two) times daily. 02/15/24   Alvan Dorn FALCON, MD  icosapent  Ethyl (VASCEPA ) 1 g capsule Take 1 capsule (1 g total) by mouth 2 (two) times daily. 04/13/24   Alvan Dorn FALCON, MD  insulin  glargine (LANTUS ) 100 UNIT/ML injection Inject 16-50 Units into the skin daily. Inject 16 units in the morning and 50 in the evening    [provider]  JARDIANCE 25 MG TABS tablet Take 25 mg by mouth daily. 11/18/21   [provider]  labetalol  (NORMODYNE ) 100 MG tablet TAKE (1) TABLET TWICE DAILY. 04/20/23   Alvan Dorn FALCON, MD  labetalol  (NORMODYNE ) 200 MG tablet Take 1 tablet (200 mg total) by mouth 2 (two) times daily. 01/16/24   Alvan Dorn FALCON, MD  LUMIGAN 0.01 % SOLN Place 1 drop into both eyes at bedtime.    [provider]  meclizine (ANTIVERT) 25 MG tablet Take 25 mg by mouth 2 (two) times daily as needed. 06/18/22   [provider]  nitroGLYCERIN  (NITROSTAT ) 0.4 MG SL tablet Place 1 tablet (0.4 mg total) under the tongue every 5 (five) minutes as needed for chest pain. Patient taking differently: Place 0.4 mg under the tongue every 5 (five) minutes x 3 doses as needed  for chest pain. 03/16/18   Hammond, Janine, NP  oxyCODONE -acetaminophen  (PERCOCET) 10-325 MG tablet Take 1 tablet by mouth 2 (two) times daily.    [provider]  SYMBICORT 160-4.5 MCG/ACT inhaler Inhale 2 puffs into the lungs daily.    [provider]  timolol (TIMOPTIC) 0.5 % ophthalmic solution Place 1 drop into both eyes daily. 06/11/22   [provider]    Allergies: Alendronate, Clonidine derivatives, Crestor  [rosuvastatin ], Lisinopril , Losartan, Codeine, and Hydrocodone    Review of Systems  Constitutional:  Negative for fever.  Respiratory:  Positive for cough and shortness of breath. Negative  for hemoptysis and sputum production.   Cardiovascular:  Positive for leg swelling. Negative for chest pain.  Gastrointestinal:  Negative for abdominal pain and vomiting.  Skin:  Positive for wound.    Updated Vital Signs BP (!) 219/61   Pulse 72   Temp 98.6 F (37 C) (Axillary)   Resp 17   Ht 5' 5 (1.651 m)   Wt 78.5 kg   SpO2 98%   BMI 28.80 kg/m   Physical Exam Vitals and nursing note reviewed.  Constitutional:      General: She is not in acute distress.    Appearance: She is well-developed.  HENT:     Head: Normocephalic and atraumatic.  Eyes:     Conjunctiva/sclera: Conjunctivae normal.  Cardiovascular:     Rate and Rhythm: Normal rate and regular rhythm.     Heart sounds: No murmur heard. Pulmonary:     Effort: Tachypnea and accessory muscle usage present. No respiratory distress.     Breath sounds: Wheezing and rhonchi present.  Abdominal:     Palpations: Abdomen is soft.     Tenderness: There is no abdominal tenderness.  Musculoskeletal:        General: No swelling.     Cervical back: Neck supple.     Right lower leg: Edema present.     Left lower leg: Edema present.  Skin:    General: Skin is warm and dry.     Capillary Refill: Capillary refill takes less than 2 seconds.  Neurological:     General: No focal deficit present.     Mental Status: She is alert.     (all labs ordered are listed, but only abnormal results are displayed) Labs Reviewed  COMPREHENSIVE METABOLIC PANEL WITH GFR  PRO BRAIN NATRIURETIC PEPTIDE  CBC WITH DIFFERENTIAL/PLATELET  PROTIME-INR  BLOOD GAS, VENOUS  URINALYSIS, W/ REFLEX TO CULTURE (INFECTION SUSPECTED)  TYPE AND SCREEN  TROPONIN T, HIGH SENSITIVITY    EKG: None  Radiology: No results found.  {Document cardiac monitor, telemetry assessment procedure when appropriate:32947} Procedures   Medications Ordered in the ED - No data to display  Clinical Course as of 08/14/24 2158  Tue Aug 14, 2024  2150 Reviewed  results with patient.  She said her breathing feels much better.  Of asked respiratory to see if they can get her off of BiPAP.  She would rather not be transferred to [facility] if it was not necessary.  I said I would review this with the hospitalist [MB]    Clinical Course User Index [MB] Towana Ozell BROCKS, MD   {Click here for ABCD2, HEART and other calculators REFRESH Note before signing:1}                              Medical Decision Making Amount and/or Complexity of Data Reviewed  Labs: ordered. Radiology: ordered.  Risk Prescription drug management.   This patient complains of ***; this involves an extensive number of treatment Options and is a complaint that carries with it a high risk of complications and morbidity. The differential includes ***  I ordered, reviewed and interpreted labs, which included *** I ordered medication *** and reviewed PMP when indicated. I ordered imaging studies which included *** and I independently    visualized and interpreted imaging which showed *** Additional history obtained from *** Previous records obtained and reviewed *** I consulted *** and discussed lab and imaging findings and discussed disposition.  Cardiac monitoring reviewed, *** Social determinants considered, *** Critical Interventions: ***  After the interventions stated above, I reevaluated the patient and found *** Admission and further testing considered, ***   {Document critical care time when appropriate  Document review of labs and clinical decision tools ie CHADS2VASC2, etc  Document your independent review of radiology images and any outside records  Document your discussion with family members, caretakers and with consultants  Document social determinants of health affecting pt's care  Document your decision making why or why not admission, treatments were needed:32947:::1}   Final diagnoses:  None    ED Discharge Orders     None

## 2024-08-14 NOTE — ED Triage Notes (Signed)
 Pt c/o sudden onset of sob, pt had surgery yesterday at [facility] (stent placement). Ems placed pt on cpap, 125mg  solumedrol and albuterol .

## 2024-08-14 NOTE — ED Notes (Signed)
 Writer changed dressing over surgical incision in left groin. Xeroform placed on incision and covered by ABD dressing.

## 2024-08-15 ENCOUNTER — Inpatient Hospital Stay (HOSPITAL_COMMUNITY)

## 2024-08-15 DIAGNOSIS — I5031 Acute diastolic (congestive) heart failure: Secondary | ICD-10-CM | POA: Diagnosis not present

## 2024-08-15 DIAGNOSIS — I5033 Acute on chronic diastolic (congestive) heart failure: Secondary | ICD-10-CM | POA: Diagnosis not present

## 2024-08-15 LAB — CBC
HCT: 34 % — ABNORMAL LOW (ref 36.0–46.0)
Hemoglobin: 10.6 g/dL — ABNORMAL LOW (ref 12.0–15.0)
MCH: 29.2 pg (ref 26.0–34.0)
MCHC: 31.2 g/dL (ref 30.0–36.0)
MCV: 93.7 fL (ref 80.0–100.0)
Platelets: 270 K/uL (ref 150–400)
RBC: 3.63 MIL/uL — ABNORMAL LOW (ref 3.87–5.11)
RDW: 14.7 % (ref 11.5–15.5)
WBC: 6.1 K/uL (ref 4.0–10.5)
nRBC: 0 % (ref 0.0–0.2)

## 2024-08-15 LAB — HEMOGLOBIN A1C
Hgb A1c MFr Bld: 7.4 % — ABNORMAL HIGH (ref 4.8–5.6)
Mean Plasma Glucose: 165.68 mg/dL

## 2024-08-15 LAB — ECHOCARDIOGRAM COMPLETE
Area-P 1/2: 3.72 cm2
Height: 62 in
S' Lateral: 2.5 cm
Weight: 2821.89 [oz_av]

## 2024-08-15 LAB — BASIC METABOLIC PANEL WITH GFR
Anion gap: 20 — ABNORMAL HIGH (ref 5–15)
BUN: 21 mg/dL (ref 8–23)
CO2: 21 mmol/L — ABNORMAL LOW (ref 22–32)
Calcium: 9.5 mg/dL (ref 8.9–10.3)
Chloride: 97 mmol/L — ABNORMAL LOW (ref 98–111)
Creatinine, Ser: 0.68 mg/dL (ref 0.44–1.00)
GFR, Estimated: >60 mL/min (ref >60)
Glucose, Bld: 258 mg/dL — ABNORMAL HIGH (ref 70–99)
Potassium: 4.9 mmol/L (ref 3.5–5.1)
Sodium: 137 mmol/L (ref 135–145)

## 2024-08-15 LAB — GLUCOSE, CAPILLARY
Glucose-Capillary: 255 mg/dL — ABNORMAL HIGH (ref 70–99)
Glucose-Capillary: 350 mg/dL — ABNORMAL HIGH (ref 70–99)
Glucose-Capillary: 300 mg/dL — ABNORMAL HIGH (ref 70–99)
Glucose-Capillary: 306 mg/dL — ABNORMAL HIGH (ref 70–99)

## 2024-08-15 LAB — PHOSPHORUS: Phosphorus: 4.4 mg/dL (ref 2.5–4.6)

## 2024-08-15 LAB — MAGNESIUM: Magnesium: 2.5 mg/dL — ABNORMAL HIGH (ref 1.7–2.4)

## 2024-08-15 LAB — PRO BRAIN NATRIURETIC PEPTIDE: Pro Brain Natriuretic Peptide: 1569 pg/mL — ABNORMAL HIGH (ref <300.0)

## 2024-08-15 MED ORDER — ASPIRIN 81 MG PO TBEC
81.0000 mg | DELAYED_RELEASE_TABLET | Freq: Every day | ORAL | Status: DC
  Administered 2024-08-15 – 2024-08-17 (×3): 81 mg via ORAL
  Filled 2024-08-15 (×3): qty 1

## 2024-08-15 MED ORDER — CIPROFLOXACIN HCL 250 MG PO TABS: 500.0000 mg | ORAL_TABLET | Freq: Two times a day (BID) | ORAL | Status: DC

## 2024-08-15 MED ORDER — AMLODIPINE BESYLATE 5 MG PO TABS
5.0000 mg | ORAL_TABLET | Freq: Every day | ORAL | Status: DC
  Administered 2024-08-15: 5 mg via ORAL
  Filled 2024-08-15 (×2): qty 1

## 2024-08-15 MED ORDER — EMPAGLIFLOZIN 25 MG PO TABS
25.0000 mg | ORAL_TABLET | Freq: Every day | ORAL | Status: DC
  Administered 2024-08-15 – 2024-08-17 (×3): 25 mg via ORAL
  Filled 2024-08-15 (×4): qty 1

## 2024-08-15 MED ORDER — FUROSEMIDE 10 MG/ML IJ SOLN
20.0000 mg | Freq: Two times a day (BID) | INTRAMUSCULAR | Status: DC
  Administered 2024-08-15 – 2024-08-16 (×3): 20 mg via INTRAVENOUS
  Filled 2024-08-15 (×3): qty 2

## 2024-08-15 MED ORDER — HYDRALAZINE HCL 20 MG/ML IJ SOLN: 10.0000 mg | INTRAMUSCULAR | Status: DC | PRN

## 2024-08-15 MED ORDER — CEPHALEXIN 500 MG PO CAPS
500.0000 mg | ORAL_CAPSULE | Freq: Four times a day (QID) | ORAL | Status: DC
  Administered 2024-08-15 – 2024-08-17 (×10): 500 mg via ORAL
  Filled 2024-08-15 (×10): qty 1

## 2024-08-15 MED ORDER — INFLUENZA VAC SPLIT HIGH-DOSE 0.5 ML IM SUSY
0.5000 mL | PREFILLED_SYRINGE | INTRAMUSCULAR | Status: AC
  Administered 2024-08-16: 0.5 mL via INTRAMUSCULAR
  Filled 2024-08-15: qty 0.5

## 2024-08-15 MED ORDER — ENOXAPARIN SODIUM 40 MG/0.4ML IJ SOSY: 40.0000 mg | PREFILLED_SYRINGE | INTRAMUSCULAR | Status: DC

## 2024-08-15 MED ORDER — TIMOLOL MALEATE 0.5 % OP SOLN
1.0000 [drp] | Freq: Every day | OPHTHALMIC | Status: DC
  Administered 2024-08-15 – 2024-08-17 (×3): 1 [drp] via OPHTHALMIC
  Filled 2024-08-15: qty 5

## 2024-08-15 MED ORDER — INSULIN GLARGINE-YFGN 100 UNIT/ML ~~LOC~~ SOLN
25.0000 [IU] | Freq: Every day | SUBCUTANEOUS | Status: DC
  Administered 2024-08-15 – 2024-08-16 (×2): 25 [IU] via SUBCUTANEOUS
  Filled 2024-08-15 (×3): qty 0.25

## 2024-08-15 MED ORDER — ACETAMINOPHEN 500 MG PO TABS: 500.0000 mg | ORAL_TABLET | Freq: Four times a day (QID) | ORAL | Status: DC | PRN

## 2024-08-15 MED ORDER — CLOPIDOGREL BISULFATE 75 MG PO TABS
75.0000 mg | ORAL_TABLET | Freq: Every day | ORAL | Status: DC
  Administered 2024-08-15 – 2024-08-17 (×3): 75 mg via ORAL
  Filled 2024-08-15 (×3): qty 1

## 2024-08-15 MED ORDER — INSULIN ASPART 100 UNIT/ML IJ SOLN
0.0000 [IU] | Freq: Every day | INTRAMUSCULAR | Status: DC
  Administered 2024-08-15: 4 [IU] via SUBCUTANEOUS
  Administered 2024-08-16: 2 [IU] via SUBCUTANEOUS
  Filled 2024-08-15 (×2): qty 1

## 2024-08-15 MED ORDER — NITROGLYCERIN 0.4 MG SL SUBL
0.4000 mg | SUBLINGUAL_TABLET | SUBLINGUAL | Status: DC | PRN
Start: 2024-08-15 — End: 2024-08-17

## 2024-08-15 MED ORDER — HYDRALAZINE HCL 25 MG PO TABS
25.0000 mg | ORAL_TABLET | Freq: Two times a day (BID) | ORAL | Status: DC
  Administered 2024-08-15 – 2024-08-17 (×5): 25 mg via ORAL
  Filled 2024-08-15 (×5): qty 1

## 2024-08-15 MED ORDER — FLUTICASONE FUROATE-VILANTEROL 100-25 MCG/ACT IN AEPB
1.0000 | INHALATION_SPRAY | Freq: Every day | RESPIRATORY_TRACT | Status: DC
  Administered 2024-08-16 – 2024-08-17 (×2): 1 via RESPIRATORY_TRACT
  Filled 2024-08-15: qty 28

## 2024-08-15 MED ORDER — MELATONIN 3 MG PO TABS: 6.0000 mg | ORAL_TABLET | Freq: Every evening | ORAL | Status: DC | PRN

## 2024-08-15 MED ORDER — POLYETHYLENE GLYCOL 3350 17 G PO PACK: 17.0000 g | PACK | Freq: Every day | ORAL | Status: DC | PRN

## 2024-08-15 MED ORDER — GABAPENTIN 100 MG PO CAPS
100.0000 mg | ORAL_CAPSULE | Freq: Every day | ORAL | Status: DC
  Administered 2024-08-15 – 2024-08-17 (×3): 100 mg via ORAL
  Filled 2024-08-15 (×3): qty 1

## 2024-08-15 MED ORDER — INSULIN ASPART 100 UNIT/ML IJ SOLN
0.0000 [IU] | Freq: Three times a day (TID) | INTRAMUSCULAR | Status: DC
  Administered 2024-08-15 (×2): 5 [IU] via SUBCUTANEOUS
  Administered 2024-08-15: 7 [IU] via SUBCUTANEOUS
  Administered 2024-08-16: 1 [IU] via SUBCUTANEOUS
  Administered 2024-08-16 (×2): 3 [IU] via SUBCUTANEOUS
  Administered 2024-08-17: 2 [IU] via SUBCUTANEOUS
  Administered 2024-08-17: 5 [IU] via SUBCUTANEOUS
  Filled 2024-08-15 (×9): qty 1

## 2024-08-15 MED ORDER — EZETIMIBE 10 MG PO TABS
10.0000 mg | ORAL_TABLET | Freq: Every day | ORAL | Status: DC
  Administered 2024-08-15 – 2024-08-17 (×3): 10 mg via ORAL
  Filled 2024-08-15 (×3): qty 1

## 2024-08-15 MED ORDER — PROCHLORPERAZINE EDISYLATE 10 MG/2ML IJ SOLN: 5.0000 mg | Freq: Four times a day (QID) | INTRAMUSCULAR | Status: DC | PRN

## 2024-08-15 MED ORDER — LABETALOL HCL 200 MG PO TABS
200.0000 mg | ORAL_TABLET | Freq: Two times a day (BID) | ORAL | Status: DC
  Administered 2024-08-15 – 2024-08-17 (×5): 200 mg via ORAL
  Filled 2024-08-15 (×5): qty 1

## 2024-08-15 NOTE — TOC CM/SW Note (Signed)
 Transition of Care [facility]) - Inpatient Brief Assessment   Patient Details  Name: [name] [name] MRN: [id number] Date of Birth: Jun 19, 1941  [facility] CM/SW Contact:    Lucie Lunger, LCSWA Phone Number: 08/15/2024, 3:46 PM   Clinical Narrative: CSW notes that PT is recommending HH PT for pt at D/C. CSW attempted to meet with pt at bedside to review. Pt currently getting wound care at this time. CSW to follow up with pt tomorrow. TOC to follow.   Transition of Care Asessment: Insurance and Status: Insurance coverage has been reviewed Patient has primary care physician: Yes Home environment has been reviewed: From home Prior level of function:: Independent Prior/Current Home Services: No current home services Social Drivers of Health Review: SDOH reviewed no interventions necessary Readmission risk has been reviewed: Yes Transition of care needs: transition of care needs identified, TOC will continue to follow

## 2024-08-15 NOTE — Hospital Course (Signed)
 [name] [name] is a 83 y.o. female with medical history significant for peripheral artery disease status post left femoral embolectomy, type 2 diabetes, hypertension, OSA, chronic HFpEF, coronary artery disease status post PCI with stent placement, history of CVA, history of cellulitis and abscess of left lower extremity, status post debridement 07/28/2024 and 08/01/2024 with AMA, discharged home with home health services from [facility] yesterday and wants to be treated here at [facility], who presents to the ER due to sudden onset shortness of breath today.   ER, CT angio chest was negative for pulmonary embolism.  It showed mild interstitial edema.  Moderate bilateral pleural effusions, partially loculated on the left.  Mild thoracic lymphadenopathy, chronic likely reactive.  proBNP 908.  Hypervolemic on exam.   Received IV diuretics in the ER.  EDP requesting admission for management of acute on chronic HFpEF.

## 2024-08-15 NOTE — Progress Notes (Signed)
  Echocardiogram 2D Echocardiogram has been performed.  Tinnie FORBES Gosling RDCS 08/15/2024, 3:40 PM

## 2024-08-15 NOTE — Plan of Care (Signed)

## 2024-08-15 NOTE — Consult Note (Signed)
 WOC Nurse Consult Note: Reason for Consult: Consulted to assess bilateral lower extremities with mention of recent cellulitis and abscess to left leg.  This is found to be infection of the left groin from recent LEFT COMMON FEMORAL, PROFUNDA FEMORIS AND SUPERFICIAL FEMORAL ARTERY ENDARTERECTOMY, LEFT SFA ATHERECTOMY AND LEFT COMMON ILIAC ARTERY ANGIOPLASTY WITH LEFT EXTERNAL ILIAC ARTERY STENT WITH ARTERIOGRAM (Left) with washout and debridement on 10/29 and 08/03/24.  Most recent orders from vascular (post debridement) are for NS moist gauze dressings.   Wound type: Infectious post surgical to left inguinal fold Pressure Injury POA: NA Measurement: Bedside RN to measure on skin assessment and chart in flow sheet.  Recent debridement.  Wound bed: Bedside RN to assess on skin assessment.  Drainage (amount, consistency, odor) noted serosanguinous   Periwound:  Edema to bilateral lower legs in the setting of peripheral vascular disease and noted abrasion to right knee.  Bruising present to arms and legs on admission.  Dressing procedure/placement/frequency:Cleanse left groin wound with NS and pat dry.  Apply NS moist gauze to wound bed and cover with Dry gauze and tape.  Change each shift and PRN soilage from drainage.  Will not follow at this time.  Please re-consult if needed.  Darice Cooley MSN, RN, FNP-BC CWON Wound, Ostomy, Continence Nurse Outpatient [facility] [phone] Work cell phone:  [phone]

## 2024-08-15 NOTE — Inpatient Diabetes Management (Signed)
 Inpatient Diabetes Program Recommendations  AACE/ADA: New Consensus Statement on Inpatient Glycemic Control (2015)  Target Ranges:  Prepandial:   less than 140 mg/dL      Peak postprandial:   less than 180 mg/dL (1-2 hours)      Critically ill patients:  140 - 180 mg/dL   Lab Results  Component Value Date   GLUCAP 255 (H) 08/15/2024   HGBA1C 8.4 (H) 01/28/2020    Latest Reference Range & Units 08/15/24 07:15  Glucose-Capillary 70 - 99 mg/dL 744 (H)  (H): Data is abnormally high  Review of Glycemic Control  Diabetes history: DM2 Outpatient Diabetes medications:  Lantus  50 units daily Humalog  16 units tid Jardiance 25 mg daily  Current orders for Inpatient glycemic control: Novolog  0-9 units tid, 0-5 units hs  Inpatient Diabetes Program Recommendations:   Please consider: -Lantus  20 units daily -Novolog  3 units tid meal coverage if eats 50%  Thank you, Dagoberto E. Khalidah Herbold, RN, MSN, CNS, CDCES  Diabetes Coordinator Inpatient Glycemic Control Team Team Pager [phone] (8am-5pm) 08/15/2024 10:02 AM

## 2024-08-15 NOTE — Plan of Care (Signed)
  Problem: Acute Rehab PT Goals(only PT should resolve) Goal: Pt Will Go Supine/Side To Sit Outcome: Progressing Flowsheets (Taken 08/15/2024 1410) Pt will go Supine/Side to Sit:  with modified independence  Independently Goal: Pt Will Go Sit To Supine/Side Outcome: Progressing Flowsheets (Taken 08/15/2024 1410) Pt will go Sit to Supine/Side:  with modified independence  Independently Goal: Patient Will Perform Sitting Balance Outcome: Progressing Flowsheets (Taken 08/15/2024 1410) Patient will perform sitting balance:  with modified independence  with supervision Goal: Patient Will Transfer Sit To/From Stand Outcome: Progressing Flowsheets (Taken 08/15/2024 1410) Patient will transfer sit to/from stand:  with supervision  with modified independence Goal: Pt Will Transfer Bed To Chair/Chair To Bed Outcome: Progressing Flowsheets (Taken 08/15/2024 1410) Pt will Transfer Bed to Chair/Chair to Bed:  with supervision  with modified independence Goal: Pt Will Perform Standing Balance Or Pre-Gait Outcome: Progressing Flowsheets (Taken 08/15/2024 1410) Pt will perform standing balance or pre-gait:  with Modified Independent  with Supervision Note: W/RW Goal: Pt Will Ambulate Outcome: Progressing Flowsheets (Taken 08/15/2024 1410) Pt will Ambulate:  with supervision  with contact guard assist  with rolling walker  50 feet   Ivery Cable, SPT

## 2024-08-15 NOTE — Evaluation (Signed)
 Occupational Therapy Evaluation Patient Details Name: [name] [name] MRN: [id number] DOB: December 20, 1940 Today's Date: 08/15/2024   History of Present Illness   [name] [name] is a 83 y.o. female with medical history significant for peripheral artery disease status post left femoral embolectomy, type 2 diabetes, hypertension, OSA, chronic HFpEF, coronary artery disease status post PCI with stent placement, history of CVA, history of cellulitis and abscess of left lower extremity, status post debridement 07/28/2024 and 08/01/2024 with LMA, discharged home with home health services from [facility] yesterday and wants to be treated here at [facility], who presents to the ER due to sudden onset shortness of breath today.     In the ER, CT angio chest was negative for pulmonary embolism.  It showed mild interstitial edema.  Moderate bilateral pleural effusions, partially loculated on the left.  Mild thoracic lymphadenopathy, chronic likely reactive.  proBNP 908.  Hypervolemic on exam.     Received IV diuretics in the ER.  EDP requesting admission for management of acute on chronic HFpEF.     Clinical Impressions Pt agreeable to OT evaluation. Pt reports that she has a nurse aid to assist as needed at home 4x per week. She reports that she is unable to don socks or ted hose independently. During evaluation, pt required min assist and RW to complete sit to stand t/f. Pt would benefit from continued OT services while in acute care.      If plan is discharge home, recommend the following:   A little help with walking and/or transfers;A little help with bathing/dressing/bathroom      Equipment Recommendations   None recommended by OT      Precautions/Restrictions   Precautions Precautions: Fall Recall of Precautions/Restrictions: Intact Restrictions Weight Bearing Restrictions Per Provider Order: No     Mobility Bed Mobility Overal bed mobility: Needs Assistance, Modified Independent Bed  Mobility: Supine to Sit     Supine to sit: Supervision, Contact guard          Transfers Overall transfer level: Needs assistance Equipment used: Rolling walker (2 wheels) Transfers: Sit to/from Stand, Bed to chair/wheelchair/BSC Sit to Stand: Min assist     Step pivot transfers: Min assist, Contact guard assist     General transfer comment: RW      Balance Overall balance assessment: Needs assistance Sitting-balance support: No upper extremity supported, Feet supported Sitting balance-Leahy Scale: Good Sitting balance - Comments: to EOB   Standing balance support: During functional activity, Reliant on assistive device for balance, Bilateral upper extremity supported Standing balance-Leahy Scale: Fair Standing balance comment: Good/Fair w/RW                           ADL either performed or assessed with clinical judgement   ADL Overall ADL's : Needs assistance/impaired Eating/Feeding: Set up;Sitting   Grooming: Set up;Sitting   Upper Body Bathing: Set up;Sitting   Lower Body Bathing: Moderate assistance;Sit to/from stand   Upper Body Dressing : Set up;Sitting   Lower Body Dressing: Maximal assistance (assist to don socks) Lower Body Dressing Details (indicate cue type and reason): assist to don socks Toilet Transfer: Minimal assistance;Rolling walker (2 wheels)   Toileting- Clothing Manipulation and Hygiene: Minimal assistance;Sit to/from stand   Tub/ Shower Transfer: Minimal assistance;Rolling walker (2 wheels)   Functional mobility during ADLs: Modified independent       Vision   Vision Assessment?: No apparent visual deficits  Pertinent Vitals/Pain Pain Assessment Pain Assessment: No/denies pain     Extremity/Trunk Assessment Upper Extremity Assessment Upper Extremity Assessment: Overall WFL for tasks assessed   Lower Extremity Assessment Lower Extremity Assessment: Defer to PT evaluation   Cervical / Trunk  Assessment Cervical / Trunk Assessment: Kyphotic   Communication Communication Communication: No apparent difficulties   Cognition Arousal: Alert Behavior During Therapy: WFL for tasks assessed/performed Cognition: No apparent impairments                               Following commands: Intact       Cueing  General Comments   Cueing Techniques: Verbal cues;Tactile cues  Towards the end of treatment pt was on room air SpO2 88%, and pt was put back on [place] 2L and SpO2 increased to 97%   Exercises     Shoulder Instructions      Home Living Family/patient expects to be discharged to:: Private residence Living Arrangements: Alone Available Help at Discharge: Family;Personal care attendant;Available PRN/intermittently Type of Home: Apartment Home Access: Level entry     Home Layout: One level     Bathroom Shower/Tub: Chief Strategy Officer: Standard Bathroom Accessibility: Yes   Home Equipment: Hospital bed;Cane - single point;Grab bars - tub/shower;Rolling Walker (2 wheels)   Additional Comments: Pt. Tub/shower can fit a RW if needed      Prior Functioning/Environment Prior Level of Function : Needs assist       Physical Assist : Mobility (physical);ADLs (physical) Mobility (physical): Transfers;Gait;Stairs ADLs (physical): Bathing;Dressing Mobility Comments: Pt. used RW fro community ambulation and SP cane for household ambulaiton ADLs Comments: Pt. has nurse aide (Mon-Th) 1:30-4pm. Assist w/ dressing and bathing due pt. having diffculty w/ bending foward            OT Goals(Current goals can be found in the care plan section)   ADL Goals Pt Will Perform Grooming: Independently;standing Pt Will Perform Lower Body Bathing: sit to/from stand;Independently Pt Will Transfer to Toilet: Independently;grab bars   OT Frequency:  Min 2X/week       AM-PAC OT 6 Clicks Daily Activity     Outcome Measure Help from another person  eating meals?: None Help from another person taking care of personal grooming?: None Help from another person toileting, which includes using toliet, bedpan, or urinal?: A Little Help from another person bathing (including washing, rinsing, drying)?: A Little Help from another person to put on and taking off regular upper body clothing?: None Help from another person to put on and taking off regular lower body clothing?: A Little 6 Click Score: 21   End of Session Equipment Utilized During Treatment: Rolling walker (2 wheels);Oxygen   Activity Tolerance: Patient tolerated treatment well Patient left: in chair;with call bell/phone within reach;with family/visitor present  OT Visit Diagnosis: Muscle weakness (generalized) (M62.81)                Time: 8646-8583 OT Time Calculation (min): 23 min Charges:  OT General Charges $OT Visit: 1 Visit OT Evaluation $OT Eval Low Complexity: 1 Low OT Treatments $Self Care/Home Management : 8-22 mins   [name] [name], OTR/L 08/15/2024, 4:15 PM

## 2024-08-15 NOTE — Progress Notes (Signed)
 Spoke to patient about using CPAP tonight.  Patient stated that she does not use CPAP at home and has refused for tonight.  Patient is aware we have them here if she changes her mind.

## 2024-08-15 NOTE — H&P (Addendum)
 History and Physical  [name] [name] FMW:[id number] DOB: [birth year] DOA: 08/14/2024  Referring physician: Dr. Towana, EDP  PCP: Medicine, [facility]  Outpatient Specialists: Vascular surgery, cardiology, Dr. Alvan. Patient coming from: Home.  Chief Complaint: Shortness of breath.  HPI: [name] [name] is a 83 y.o. female with medical history significant for peripheral artery disease status post left femoral embolectomy, type 2 diabetes, hypertension, OSA, chronic HFpEF, coronary artery disease status post PCI with stent placement, history of CVA, history of cellulitis and abscess of left lower extremity, status post debridement 07/28/2024 and 08/01/2024 with LMA, discharged home with home health services from [facility] yesterday and wants to be treated here at [facility], who presents to the ER due to sudden onset shortness of breath today.  In the ER, CT angio chest was negative for pulmonary embolism.  It showed mild interstitial edema.  Moderate bilateral pleural effusions, partially loculated on the left.  Mild thoracic lymphadenopathy, chronic likely reactive.  proBNP 908.  Hypervolemic on exam.  Received IV diuretics in the ER.  EDP requesting admission for management of acute on chronic HFpEF.  ED Course: Temperature 98.5.  BP 153/57, pulse 72, respiratory rate 16, O2 saturation 100% on 2 L nasal cannula.  Review of Systems: Review of systems as noted in the HPI. All other systems reviewed and are negative.   Past Medical History:  Diagnosis Date   Anxiety    Arthritis    neck (03/15/2018)   CAD (coronary artery disease)    Status post POBA distal LAD, status post Cardiolite Myoview 2008 negative for ischemia   COPD (chronic obstructive pulmonary disease) (HCC)    Depression    GERD (gastroesophageal reflux disease)    Hypercholesterolemia    Hypertension    Peripheral vascular disease     status post iliac stent July 2001    Type II diabetes mellitus (HCC)    Past Surgical  History:  Procedure Laterality Date   ANTERIOR CERVICAL DECOMP/DISCECTOMY FUSION  2001   BACK SURGERY     BREAST SURGERY     biopsy left breat-benign   CATARACT EXTRACTION, BILATERAL Bilateral    CHOLECYSTECTOMY OPEN     CORONARY ANGIOPLASTY     POBA distal LAD,   CORONARY STENT INTERVENTION N/A 03/15/2018   Procedure: CORONARY STENT INTERVENTION;  Surgeon: Dann Candyce RAMAN, MD;  Location: [facility] INVASIVE CV LAB;  Service: Cardiovascular;  Laterality: N/A;   DILATION AND CURETTAGE OF UTERUS  1960s   EYE SURGERY Bilateral    lasers   LEFT HEART CATH AND CORONARY ANGIOGRAPHY N/A 03/15/2018   Procedure: LEFT HEART CATH AND CORONARY ANGIOGRAPHY;  Surgeon: Dann Candyce RAMAN, MD;  Location: [facility] INVASIVE CV LAB;  Service: Cardiovascular;  Laterality: N/A;   PERIPHERAL VASCULAR INTERVENTION Right 04/2000    iliac stent    stents legs  2025   TUBAL LIGATION      Social History:  reports that she quit smoking about 28 years ago. Her smoking use included cigarettes. She started smoking about 62 years ago. She has a 20 pack-year smoking history. She has never used smokeless tobacco. She reports that she does not drink alcohol and does not use drugs.   Allergies  Allergen Reactions   Alendronate Swelling and Other (See Comments)    Leg edema   Lisinopril  Anaphylaxis, Swelling and Other (See Comments)    Tongue swelling   Losartan Swelling and Other (See Comments)    Tongue swelling   Clonidine Derivatives Other (See Comments)  Dizziness    Codeine Nausea And Vomiting   Hydrocodone Nausea And Vomiting   Hydrocodone-Acetaminophen  Nausea And Vomiting and Nausea Only   Pioglitazone Swelling    Swelling of ankles   Ramelteon Nausea And Vomiting and Nausea Only   Rosuvastatin  Other (See Comments)    Muscle cramps   Valsartan Nausea And Vomiting and Other (See Comments)    Dizziness    Family History  Problem Relation Age of Onset   Heart failure Father    Heart failure Mother     Diabetes Mother    COPD Brother       Prior to Admission medications   Medication Sig Start Date End Date Taking? Authorizing Provider  amLODipine  (NORVASC ) 5 MG tablet take 1 tablet (5 MILLIGRAM total) by mouth daily. 07/17/24   Alvan Dorn FALCON, MD  aspirin  EC 81 MG tablet Take 81 mg by mouth daily. 09/25/19   [provider]  cephALEXin (KEFLEX) 500 MG capsule Take 500 mg by mouth every 6 (six) hours. 08/13/24 08/20/24  [provider]  ciprofloxacin (CIPRO) 500 MG tablet Take 500 mg by mouth 2 (two) times daily. 07/19/24   [provider]  clopidogrel  (PLAVIX ) 75 MG tablet take 1 tablet by mouth daily 06/15/24   Branch, Jonathan F, MD  cyclobenzaprine (FLEXERIL) 5 MG tablet Take 5 mg by mouth at bedtime as needed. 06/29/24   [provider]  ezetimibe  (ZETIA ) 10 MG tablet take 1 tablet (10 MILLIGRAM total) by mouth daily. 05/14/24   Alvan Dorn FALCON, MD  fluticasone  (FLONASE) 50 MCG/ACT nasal spray Place 2 sprays into both nostrils daily. 06/18/22   [provider]  furosemide  (LASIX ) 20 MG tablet take 1 tablet by mouth daily 02/16/24   Branch, Jonathan F, MD  gabapentin (NEURONTIN) 100 MG capsule Take 100 mg by mouth daily.    [provider]  HUMALOG  KWIKPEN 200 UNIT/ML KwikPen Inject 16 Units into the skin with breakfast, with lunch, and with evening meal. 06/18/22   [provider]  hydrALAZINE  (APRESOLINE ) 25 MG tablet Take 1 tablet (25 mg total) by mouth 2 (two) times daily. 02/15/24   Alvan Dorn FALCON, MD  icosapent  Ethyl (VASCEPA ) 1 g capsule Take 1 capsule (1 g total) by mouth 2 (two) times daily. 04/13/24   Alvan Dorn FALCON, MD  JARDIANCE 25 MG TABS tablet Take 25 mg by mouth daily. 11/18/21   [provider]  labetalol  (NORMODYNE ) 100 MG tablet TAKE (1) TABLET TWICE DAILY. 04/20/23   Alvan Dorn FALCON, MD  labetalol  (NORMODYNE ) 200 MG tablet Take 1 tablet (200 mg total) by mouth 2 (two) times daily. 01/16/24    Alvan Dorn FALCON, MD  LANTUS  SOLOSTAR 100 UNIT/ML Solostar Pen Inject 50 Units into the skin daily. 09/18/23   [provider]  LUMIGAN 0.01 % SOLN Place 1 drop into both eyes at bedtime.    [provider]  meclizine (ANTIVERT) 25 MG tablet Take 25 mg by mouth 2 (two) times daily as needed. 06/18/22   [provider]  nitroGLYCERIN  (NITROSTAT ) 0.4 MG SL tablet Place 1 tablet (0.4 mg total) under the tongue every 5 (five) minutes as needed for chest pain. Patient taking differently: Place 0.4 mg under the tongue every 5 (five) minutes x 3 doses as needed for chest pain. 03/16/18   Hammond, Janine, NP  oxyCODONE -acetaminophen  (PERCOCET) 10-325 MG tablet Take 1 tablet by mouth 2 (two) times daily.    [provider]  SYMBICORT 160-4.5 MCG/ACT  inhaler Inhale 2 puffs into the lungs daily.    [provider]  timolol (TIMOPTIC) 0.5 % ophthalmic solution Place 1 drop into both eyes daily. 06/11/22   [provider]  traMADol (ULTRAM) 50 MG tablet Take 50 mg by mouth every 6 (six) hours as needed. 07/12/24   [provider]    Physical Exam: BP (!) 153/57 (BP Location: Left Arm)   Pulse 72   Temp 98.5 F (36.9 C) (Oral)   Resp 18   Ht 5' 2 (1.575 m)   Wt 80.1 kg   SpO2 100%   BMI 32.30 kg/m   General: 83 y.o. year-old female well developed well nourished in no acute distress.  Alert and oriented x3. Cardiovascular: Regular rate and rhythm with no rubs or gallops.  No thyromegaly or JVD noted.  3+ pitting edema in left lower extremity. Respiratory: Fine rales at bases. Good inspiratory effort. Abdomen: Soft nontender nondistended with normal bowel sounds x4 quadrants. Muskuloskeletal: No cyanosis or clubbing noted bilaterally Neuro: CN II-XII intact, strength, sensation, reflexes Skin: No ulcerative lesions noted or rashes Psychiatry: Judgement and insight appear normal. Mood is appropriate for condition and setting           Labs on Admission:  Basic Metabolic Panel: Recent Labs  Lab 08/14/24 1922  NA 137  K 5.2*  CL 99  CO2 25  GLUCOSE 238*  BUN 17  CREATININE 0.71  CALCIUM  9.4   Liver Function Tests: Recent Labs  Lab 08/14/24 1922  AST 33  ALT 23  ALKPHOS 136*  BILITOT 0.8  PROT 7.1  ALBUMIN 4.2   No results for input(s): LIPASE, AMYLASE in the last 168 hours. No results for input(s): AMMONIA in the last 168 hours. CBC: Recent Labs  Lab 08/14/24 1922  WBC 9.2  NEUTROABS 7.4  HGB 10.3*  HCT 32.9*  MCV 94.0  PLT 268   Cardiac Enzymes: No results for input(s): CKTOTAL, CKMB, CKMBINDEX, TROPONINI in the last 168 hours.  BNP (last 3 results) No results for input(s): BNP in the last 8760 hours.  ProBNP (last 3 results) Recent Labs    08/14/24 1922  PROBNP 908.0*    CBG: No results for input(s): GLUCAP in the last 168 hours.  Radiological Exams on Admission: CT Angio Chest PE W/Cm &/Or Wo Cm Result Date: 08/14/2024 EXAM: CTA of the Chest with contrast for PE 08/14/2024 09:27:08 PM TECHNIQUE: CTA of the chest was performed after the administration of 75 mL of iohexol  (OMNIPAQUE ) 350 MG/ML injection. Multiplanar reformatted images are provided for review. MIP images are provided for review. Automated exposure control, iterative reconstruction, and/or weight based adjustment of the mA/kV was utilized to reduce the radiation dose to as low as reasonably achievable. COMPARISON: Cardiac CT 02/28/2018 CLINICAL HISTORY: Pulmonary embolism (PE) suspected, high prob. FINDINGS: PULMONARY ARTERIES: Pulmonary arteries are adequately opacified for evaluation. No pulmonary embolism. Main pulmonary artery is normal in caliber. MEDIASTINUM: The heart and pericardium demonstrate no acute abnormality. Moderate 3-vessel coronary atherosclerosis. Thoracic aortic atherosclerosis. LYMPH NODES: Thoracic lymphadenopathy, including a 19 mm short axis low right paratracheal node, a 16 mm  short axis right hilar node, and an 18 mm short axis subcarinal node partially visualized but favored to be unchanged from prior. LUNGS AND PLEURA: Mild compressive atelectasis in the bilateral lower lobes. Interlobular septal thickening, suggesting mild interstitial edema. Moderate bilateral pleural effusions, partially loculated on the left. No pneumothorax. UPPER ABDOMEN: Limited images of the upper abdomen are unremarkable. SOFT  TISSUES AND BONES: Cervical spine fixation hardware, incompletely visualized. No acute soft tissue abnormality. IMPRESSION: 1. No pulmonary embolism. 2. Mild interstitial edema. Moderate bilateral pleural effusions, partially loculated on the left. 3. Mild thoracic lymphadenopathy, chronic, likely reactive. Electronically signed by: Pinkie Pebbles MD 08/14/2024 09:34 PM EST RP Workstation: [id number]   DG Chest Port 1 View Result Date: 08/14/2024 EXAM: 1 VIEW(S) XRAY OF THE CHEST 08/14/2024 07:24:18 PM COMPARISON: 01/28/2020 CLINICAL HISTORY: sob FINDINGS: LUNGS AND PLEURA: Bilateral interstitial prominence consistent with pulmonary edema. Focal airspace opacity in left lower lateral lung. Small bilateral pleural effusions. HEART AND MEDIASTINUM: Cardiomegaly. Aortic calcification. BONES AND SOFT TISSUES: Cervical spine surgical hardware. Old bilateral rib fractures. IMPRESSION: 1. Bilateral interstitial prominence consistent with pulmonary edema with small bilateral pleural effusions. 2. Focal airspace opacity in the left lower lateral lung, which could represent superimposed infection or atelectasis. 3. Cardiomegaly. Electronically signed by: Oneil Devonshire MD 08/14/2024 07:27 PM EST RP Workstation: [id number]    EKG: I independently viewed the EKG done and my findings are as followed: Sinus rhythm rate of 71.  Nonspecific ST changes.  QTc 436.  Assessment/Plan Present on Admission:  Acute on chronic diastolic (congestive) heart failure (HCC)  Principal Problem:   Acute on  chronic diastolic (congestive) heart failure (HCC)  Acute on chronic HFpEF Last 2D echo done on 06/18/2016 revealed LVEF 65 to 70% Follow transthoracic echocardiogram Continue diuretics Monitor strict I's and O's and daily weight  Type 2 diabetes with hyperglycemia Serum glucose 238 Last hemoglobin A1c 8.4 on 01/28/2020 Insulin  coverage Heart healthy carb modified diet.  Peripheral artery disease status post left femoral embolectomy Recently done at [facility] Close follow-up per discharge with vascular surgery. Resume home regimen after med reconciliation. Restarted home Plavix  and Zetia   Coronary artery disease Same management as stated above Monitor on telemetry  History of cellulitis and abscess of left lower extremity, status post debridement 07/28/2024 and 08/01/2024 with LMA Resume home Keflex Currently no leukocytosis and afebrile. Nontoxic-appearing  Hypertension BP is not at goal, elevated Continue diuretics Resume home Norvasc   Obesity BMI 32 Recommend weight loss outpatient with regular physical activity and healthy diet.  OSA Resume home CPAP   Time: 75 minutes.   DVT prophylaxis: Subcu Lovenox  daily.  Code Status: Full code.  Family Communication: None at bedside.  Disposition Plan: Admitted to telemetry unit.  Consults called: None.  Admission status: Inpatient status.   Status is: Inpatient The patient requires at least 2 midnights for further evaluation and treatment of presenting condition.   Terry LOISE Hurst MD [facility] Pager [phone]  If 7PM-7AM, please contact night-coverage [email or web address] Password [email or web address]  08/15/2024, 5:05 AM

## 2024-08-15 NOTE — Progress Notes (Signed)
 PROGRESS NOTE    Patient: [name] [name]                            PCP: Medicine, [facility] Internal                    DOB: 1941/08/28            DOA: 08/14/2024 FMW:[id number]             DOS: 08/15/2024, 7:15 AM   LOS: 1 day   Date of Service: The patient was seen and examined on 08/15/2024  Subjective:   The patient was seen and examined this morning. Hypertensive otherwise hemodynamically stable. Still requiring 3-4 L of oxygen , satting 99% Complaint of shortness of breath with exertion denies any chest pain no cough No issues overnight .  Brief Narrative:   [name] [name] is a 83 y.o. female with medical history significant for peripheral artery disease status post left femoral embolectomy, type 2 diabetes, hypertension, OSA, chronic HFpEF, coronary artery disease status post PCI with stent placement, history of CVA, history of cellulitis and abscess of left lower extremity, status post debridement 07/28/2024 and 08/01/2024 with AMA, discharged home with home health services from [facility] yesterday and wants to be treated here at [facility], who presents to the ER due to sudden onset shortness of breath today.   ER, CT angio chest was negative for pulmonary embolism.  It showed mild interstitial edema.  Moderate bilateral pleural effusions, partially loculated on the left.  Mild thoracic lymphadenopathy, chronic likely reactive.  proBNP 908.  Hypervolemic on exam.   Received IV diuretics in the ER.  EDP requesting admission for management of acute on chronic HFpEF.    Assessment & Plan:   Principal Problem:   Acute on chronic diastolic (congestive) heart failure (HCC)  Acute on chronic HFpEF SATTING 100 % O 3-4 L BNP 908.0 >>>  Reds Clip  Last 2D echo done on 06/18/2016 revealed LVEF 65 to 70% ECHO>>> Continue diuretics with IV Lasix  20 mg twice daily Monitor strict I's and O's and daily weight  Intake/Output Summary (Last 24 hours) at 08/15/2024 0717 Last data filed at  08/15/2024 0000 Gross per 24 hour  Intake --  Output 1700 ml  Net -1700 ml   Filed Weights   08/14/24 1847 08/15/24 0024 08/15/24 0500  Weight: 78.5 kg 80.1 kg 80 kg      Type 2 diabetes with hyperglycemia -Starting long-acting insulin , reducing from 50 to 25 units nightly Checking CBG q. ACHS, SSI coverage, carb modified diet Last hemoglobin A1c 8.4 on 01/28/2020    Peripheral artery disease status post left femoral embolectomy Recently done at [facility] Close follow-up per discharge with vascular surgery. Resume home regimen after med reconciliation. Restarted home Plavix  and Zetia    Coronary artery disease -Continuing aspirin , Plavix ,     History of cellulitis and abscess of left lower extremity, status post debridement 07/28/2024 and 08/01/2024 with LMA Resume home Keflex Currently no leukocytosis and afebrile. Nontoxic-appearing   Hypertension -Hypertensive, resuming home medication including Norvasc , hydralazine , labetalol     Obesity Body mass index is 32.26 kg/m. Recommend weight loss outpatient with regular physical activity and healthy diet.   OSA- Resume home CPAP       -------------------------------------------------------------------------------------------------------------------------------------- Nutritional status:  The patient's BMI is: Body mass index is 32.26 kg/m. I agree with the assessment and plan as outlined  Nutrition Status: -------------------------------------------------------------------------------------------------------------------------------------- DVT prophylaxis:  enoxaparin  (LOVENOX ) injection 40 mg Start: 08/15/24 1000   Code Status:   Code Status: Full Code  Family Communication: No family member present at bedside-  -Advance care planning has been discussed.   Admission status:   Status is: Inpatient Remains inpatient appropriate because: Needing IV diuretics   Disposition: From  - home             Planning  for discharge in 1-2 days   Procedures:   No admission procedures for hospital encounter.   Antimicrobials:  Anti-infectives (From admission, onward)    Start     Dose/Rate Route Frequency Ordered Stop   08/15/24 0615  cephALEXin (KEFLEX) capsule 500 mg        500 mg Oral Every 6 hours 08/15/24 0522          Medication:   amLODipine   5 mg Oral Daily   cephALEXin  500 mg Oral Q6H   clopidogrel   75 mg Oral Daily   enoxaparin  (LOVENOX ) injection  40 mg Subcutaneous Q24H   ezetimibe   10 mg Oral Daily   furosemide   20 mg Intravenous BID   gabapentin  100 mg Oral Daily   insulin  aspart  0-5 Units Subcutaneous QHS   insulin  aspart  0-9 Units Subcutaneous TID WC    acetaminophen , melatonin, polyethylene glycol, prochlorperazine   Objective:   Vitals:   08/15/24 0000 08/15/24 0024 08/15/24 0415 08/15/24 0500  BP: (!) 181/68 (!) 174/53 (!) 153/57   Pulse: 77 79 72   Resp: 20 18    Temp:  97.7 F (36.5 C) 98.5 F (36.9 C)   TempSrc:  Oral Oral   SpO2: 98% 98% 100%   Weight:  80.1 kg  80 kg  Height:  5' 2 (1.575 m)      Intake/Output Summary (Last 24 hours) at 08/15/2024 0715 Last data filed at 08/15/2024 0000 Gross per 24 hour  Intake --  Output 1700 ml  Net -1700 ml   Filed Weights   08/14/24 1847 08/15/24 0024 08/15/24 0500  Weight: 78.5 kg 80.1 kg 80 kg     Physical examination:   General:  AAO x 3,  cooperative, no distress;   HEENT:  Normocephalic, PERRL, otherwise with in Normal limits   Neuro:  CNII-XII intact. , normal motor and sensation, reflexes intact   Lungs:   Clear to auscultation BL, Respirations unlabored,  No wheezes / crackles  Cardio:    S1/S2, RRR, No murmure, No Rubs or Gallops   Abdomen:  Soft, non-tender, bowel sounds active all four quadrants, no guarding or peritoneal signs.  Muscular  skeletal:  Limited exam -global generalized weaknesses - in bed, able to move all 4 extremities,   2+ pulses,  symmetric, No pitting edema   Skin:  Dry, warm to touch, negative for any Rashes,  Wounds: Please see nursing documentation       ------------------------------------------------------------------------------------------------------------------------------------------    LABs:     Latest Ref Rng & Units 08/15/2024    4:38 AM 08/14/2024    7:22 PM 01/29/2020    6:03 AM  CBC  WBC 4.0 - 10.5 K/uL 6.1  9.2  5.6   Hemoglobin 12.0 - 15.0 g/dL 89.3  89.6  87.2   Hematocrit 36.0 - 46.0 % 34.0  32.9  39.2   Platelets 150 - 400 K/uL 270  268  271       Latest Ref Rng & Units 08/15/2024    4:38 AM 08/14/2024    7:22  PM 01/29/2020    6:03 AM  CMP  Glucose 70 - 99 mg/dL 741  761  693   BUN 8 - 23 mg/dL 21  17  14    Creatinine 0.44 - 1.00 mg/dL 9.31  9.28  9.28   Sodium 135 - 145 mmol/L 137  137  134   Potassium 3.5 - 5.1 mmol/L 4.9  5.2  3.6   Chloride 98 - 111 mmol/L 97  99  99   CO2 22 - 32 mmol/L 21  25  25    Calcium  8.9 - 10.3 mg/dL 9.5  9.4  8.9   Total Protein 6.5 - 8.1 g/dL  7.1    Total Bilirubin 0.0 - 1.2 mg/dL  0.8    Alkaline Phos 38 - 126 U/L  136    AST 15 - 41 U/L  33    ALT 0 - 44 U/L  23         Micro Results Recent Results (from the past 240 hours)  Resp panel by RT-PCR (RSV, Flu A&B, Covid) Anterior Nasal Swab     Status: None   Collection Time: 08/14/24 10:05 PM   Specimen: Anterior Nasal Swab  Result Value Ref Range Status   SARS Coronavirus 2 by RT PCR NEGATIVE NEGATIVE Final    Comment: (NOTE) SARS-CoV-2 target nucleic acids are NOT DETECTED.  The SARS-CoV-2 RNA is generally detectable in upper respiratory specimens during the acute phase of infection. The lowest concentration of SARS-CoV-2 viral copies this assay can detect is 138 copies/mL. A negative result does not preclude SARS-Cov-2 infection and should not be used as the sole basis for treatment or other patient management decisions. A negative result may occur with  improper specimen collection/handling, submission of  specimen other than nasopharyngeal swab, presence of viral mutation(s) within the areas targeted by this assay, and inadequate number of viral copies(<138 copies/mL). A negative result must be combined with clinical observations, patient history, and epidemiological information. The expected result is Negative.  Fact Sheet for Patients:  bloggercourse.com  Fact Sheet for Healthcare Providers:  seriousbroker.it  This test is no t yet approved or cleared by the [place]  FDA and  has been authorized for detection and/or diagnosis of SARS-CoV-2 by FDA under an Emergency Use Authorization (EUA). This EUA will remain  in effect (meaning this test can be used) for the duration of the COVID-19 declaration under Section 564(b)(1) of the Act, 21 U.S.C.section 360bbb-3(b)(1), unless the authorization is terminated  or revoked sooner.       Influenza A by PCR NEGATIVE NEGATIVE Final   Influenza B by PCR NEGATIVE NEGATIVE Final    Comment: (NOTE) The Xpert Xpress SARS-CoV-2/FLU/RSV plus assay is intended as an aid in the diagnosis of influenza from Nasopharyngeal swab specimens and should not be used as a sole basis for treatment. Nasal washings and aspirates are unacceptable for Xpert Xpress SARS-CoV-2/FLU/RSV testing.  Fact Sheet for Patients: bloggercourse.com  Fact Sheet for Healthcare Providers: seriousbroker.it  This test is not yet approved or cleared by the [place]  FDA and has been authorized for detection and/or diagnosis of SARS-CoV-2 by FDA under an Emergency Use Authorization (EUA). This EUA will remain in effect (meaning this test can be used) for the duration of the COVID-19 declaration under Section 564(b)(1) of the Act, 21 U.S.C. section 360bbb-3(b)(1), unless the authorization is terminated or revoked.     Resp Syncytial Virus by PCR NEGATIVE NEGATIVE Final     Comment: (NOTE) Fact Sheet  for Patients: bloggercourse.com  Fact Sheet for Healthcare Providers: seriousbroker.it  This test is not yet approved or cleared by the [place]  FDA and has been authorized for detection and/or diagnosis of SARS-CoV-2 by FDA under an Emergency Use Authorization (EUA). This EUA will remain in effect (meaning this test can be used) for the duration of the COVID-19 declaration under Section 564(b)(1) of the Act, 21 U.S.C. section 360bbb-3(b)(1), unless the authorization is terminated or revoked.  Performed at [facility], [address] [place], [place] [address]     Radiology Reports CT Angio Chest PE W/Cm &/Or Wo Cm Result Date: 08/14/2024 EXAM: CTA of the Chest with contrast for PE 08/14/2024 09:27:08 PM TECHNIQUE: CTA of the chest was performed after the administration of 75 mL of iohexol  (OMNIPAQUE ) 350 MG/ML injection. Multiplanar reformatted images are provided for review. MIP images are provided for review. Automated exposure control, iterative reconstruction, and/or weight based adjustment of the mA/kV was utilized to reduce the radiation dose to as low as reasonably achievable. COMPARISON: Cardiac CT 02/28/2018 CLINICAL HISTORY: Pulmonary embolism (PE) suspected, high prob. FINDINGS: PULMONARY ARTERIES: Pulmonary arteries are adequately opacified for evaluation. No pulmonary embolism. Main pulmonary artery is normal in caliber. MEDIASTINUM: The heart and pericardium demonstrate no acute abnormality. Moderate 3-vessel coronary atherosclerosis. Thoracic aortic atherosclerosis. LYMPH NODES: Thoracic lymphadenopathy, including a 19 mm short axis low right paratracheal node, a 16 mm short axis right hilar node, and an 18 mm short axis subcarinal node partially visualized but favored to be unchanged from prior. LUNGS AND PLEURA: Mild compressive atelectasis in the bilateral lower lobes. Interlobular septal  thickening, suggesting mild interstitial edema. Moderate bilateral pleural effusions, partially loculated on the left. No pneumothorax. UPPER ABDOMEN: Limited images of the upper abdomen are unremarkable. SOFT TISSUES AND BONES: Cervical spine fixation hardware, incompletely visualized. No acute soft tissue abnormality. IMPRESSION: 1. No pulmonary embolism. 2. Mild interstitial edema. Moderate bilateral pleural effusions, partially loculated on the left. 3. Mild thoracic lymphadenopathy, chronic, likely reactive. Electronically signed by: Pinkie Pebbles MD 08/14/2024 09:34 PM EST RP Workstation: [id number]   DG Chest Port 1 View Result Date: 08/14/2024 EXAM: 1 VIEW(S) XRAY OF THE CHEST 08/14/2024 07:24:18 PM COMPARISON: 01/28/2020 CLINICAL HISTORY: sob FINDINGS: LUNGS AND PLEURA: Bilateral interstitial prominence consistent with pulmonary edema. Focal airspace opacity in left lower lateral lung. Small bilateral pleural effusions. HEART AND MEDIASTINUM: Cardiomegaly. Aortic calcification. BONES AND SOFT TISSUES: Cervical spine surgical hardware. Old bilateral rib fractures. IMPRESSION: 1. Bilateral interstitial prominence consistent with pulmonary edema with small bilateral pleural effusions. 2. Focal airspace opacity in the left lower lateral lung, which could represent superimposed infection or atelectasis. 3. Cardiomegaly. Electronically signed by: Oneil Devonshire MD 08/14/2024 07:27 PM EST RP Workstation: [id number]    SIGNED: Adriana DELENA Grams, MD, FHM. FAAFP. Jolynn Pack - [facility] Time spent - 55 min.  In seeing, evaluating and examining the patient. Reviewing medical records, labs, drawn plan of care. [facility],  Pager (please use [email or web address] to page/ text) Please use Epic Secure Chat for non-urgent communication (7AM-7PM)  If 7PM-7AM, please contact night-coverage [email or web address], 08/15/2024, 7:15 AM

## 2024-08-15 NOTE — Plan of Care (Signed)
  Problem: Acute Rehab OT Goals (only OT should resolve) Goal: Pt. Will Perform Grooming Flowsheets (Taken 08/15/2024 1614) Pt Will Perform Grooming:  Independently  standing Goal: Pt. Will Perform Lower Body Bathing Flowsheets (Taken 08/15/2024 1614) Pt Will Perform Lower Body Bathing:  sit to/from stand  Independently Goal: Pt. Will Transfer To Toilet Flowsheets (Taken 08/15/2024 1614) Pt Will Transfer to Toilet:  Independently  grab bars    Chiquita Sermon, OTR/L

## 2024-08-15 NOTE — Evaluation (Signed)
 Physical Therapy Evaluation Patient Details Name: [name] [name] MRN: [id number] DOB: [birth year] Today's Date: 08/15/2024  History of Present Illness  [name] [name] is a 83 y.o. female with medical history significant for peripheral artery disease status post left femoral embolectomy, type 2 diabetes, hypertension, OSA, chronic HFpEF, coronary artery disease status post PCI with stent placement, history of CVA, history of cellulitis and abscess of left lower extremity, status post debridement 07/28/2024 and 08/01/2024 with LMA, discharged home with home health services from [facility] yesterday and wants to be treated here at [facility], who presents to the ER due to sudden onset shortness of breath today.     In the ER, CT angio chest was negative for pulmonary embolism.  It showed mild interstitial edema.  Moderate bilateral pleural effusions, partially loculated on the left.  Mild thoracic lymphadenopathy, chronic likely reactive.  proBNP 908.  Hypervolemic on exam.     Received IV diuretics in the ER.  EDP requesting admission for management of acute on chronic HFpEF.    Clinical Impression  Pt. Presented w/ general weakness in LE. Pt tolerated session well and was motivated to ambulate. Pt. Was on room air throughout all functional tasks, vitals were monitored.  Pt performed bed mobility w/ CGA/supervision to EOB SpO2 93%. During transfer from sit to stand and ambulation pt. Used RW and required CGA/ Supervision. Pt was able to ambulate and to the hallway. Towards the end of ambulation pt SpO2  declined to 87% . Then when pt. Was seated pt was placed on 2L ([place]) and SpO2  increase 97%. Pt. Was left in chair w/ call bell. Nursing staff was notified on pt status. Patient will benefit from continued skilled physical therapy in hospital and recommended venue below to increase strength, balance, endurance for safe ADLs and gait.       If plan is discharge home, recommend the following: A little help with  walking and/or transfers;Assist for transportation;Assistance with cooking/housework;A little help with bathing/dressing/bathroom;Help with stairs or ramp for entrance   Can travel by private vehicle    Yes    Equipment Recommendations None recommended by PT  Recommendations for Other Services       Functional Status Assessment Patient has had a recent decline in their functional status and demonstrates the ability to make significant improvements in function in a reasonable and predictable amount of time.     Precautions / Restrictions Precautions Precautions: Fall Recall of Precautions/Restrictions: Intact Restrictions Weight Bearing Restrictions Per Provider Order: No      Mobility  Bed Mobility Overal bed mobility: Needs Assistance, Modified Independent Bed Mobility: Supine to Sit     Supine to sit: Supervision, Contact guard     General bed mobility comments: to EOB    Transfers Overall transfer level: Needs assistance Equipment used: Rolling walker (2 wheels) Transfers: Sit to/from Stand, Bed to chair/wheelchair/BSC Sit to Stand: Min assist   Step pivot transfers: Min assist, Contact guard assist       General transfer comment: w/ RW, SpO2 was 93% on room during transfer    Ambulation/Gait Ambulation/Gait assistance: Contact guard assist, Supervision Gait Distance (Feet): 35 Feet Assistive device: Rolling walker (2 wheels) Gait Pattern/deviations: Step-to pattern, Decreased step length - right, Decreased step length - left, Decreased stance time - right, Decreased stance time - left, Decreased stride length, Wide base of support       General Gait Details: Pt. was able to ambulate w/ RW to the bathroom, was able  to ambulate into the hallway during ambulation vitals were monitored SpO2 was 88% on room during ambulation.  Stairs            Wheelchair Mobility     Tilt Bed    Modified Rankin (Stroke Patients Only)       Balance Overall  balance assessment: Needs assistance Sitting-balance support: No upper extremity supported, Feet supported Sitting balance-Leahy Scale: Good Sitting balance - Comments: to EOB   Standing balance support: During functional activity, Reliant on assistive device for balance, Bilateral upper extremity supported Standing balance-Leahy Scale: Fair Standing balance comment: Good/Fair w/RW                             Pertinent Vitals/Pain Pain Assessment Pain Assessment: No/denies pain    Home Living Family/patient expects to be discharged to:: Private residence Living Arrangements: Alone Available Help at Discharge: Family;Personal care attendant;Available PRN/intermittently Type of Home: Apartment Home Access: Level entry       Home Layout: One level Home Equipment: Hospital bed;Cane - single point;Grab bars - tub/shower;Rolling Walker (2 wheels) Additional Comments: Pt. Tub/shower can fit a RW if needed    Prior Function Prior Level of Function : Needs assist       Physical Assist : Mobility (physical);ADLs (physical) Mobility (physical): Transfers;Gait;Stairs ADLs (physical): Bathing;Dressing Mobility Comments: Pt. used RW fro community ambulation and SP cane for household ambulaiton ADLs Comments: Pt. has nurse aide (Mon-Th) 1:30-4pm. Assist w/ dressing and bathing due pt. having diffculty w/ bending foward     Extremity/Trunk Assessment        Lower Extremity Assessment Lower Extremity Assessment: Generalized weakness    Cervical / Trunk Assessment Cervical / Trunk Assessment: Kyphotic  Communication   Communication Communication: No apparent difficulties    Cognition Arousal: Alert Behavior During Therapy: WFL for tasks assessed/performed   PT - Cognitive impairments: No apparent impairments                         Following commands: Intact       Cueing Cueing Techniques: Verbal cues, Tactile cues     General Comments General  comments (skin integrity, edema, etc.): Towards the end of treatment pt was on room air SpO2 88%, and pt was put back on [place] 2L and SpO2 increased to 97%    Exercises     Assessment/Plan    PT Assessment Patient needs continued PT services  PT Problem List Decreased strength;Decreased range of motion;Decreased activity tolerance;Decreased balance;Decreased mobility;Decreased coordination;Decreased safety awareness       PT Treatment Interventions DME instruction;Gait training;Stair training;Patient/family education;Functional mobility training;Therapeutic activities;Therapeutic exercise;Balance training    PT Goals (Current goals can be found in the Care Plan section)  Acute Rehab PT Goals Patient Stated Goal: pt. want to return home PT Goal Formulation: With patient Time For Goal Achievement: 08/25/24 Potential to Achieve Goals: Good    Frequency Min 3X/week     Co-evaluation               AM-PAC PT 6 Clicks Mobility  Outcome Measure Help needed turning from your back to your side while in a flat bed without using bedrails?: None Help needed moving from lying on your back to sitting on the side of a flat bed without using bedrails?: A Little Help needed moving to and from a bed to a chair (including a wheelchair)?: A Little Help needed standing  up from a chair using your arms (e.g., wheelchair or bedside chair)?: A Little Help needed to walk in hospital room?: A Little Help needed climbing 3-5 steps with a railing? : Total 6 Click Score: 17    End of Session   Activity Tolerance: Patient tolerated treatment well;Patient limited by fatigue Patient left: in chair;with call bell/phone within reach Nurse Communication: Mobility status PT Visit Diagnosis: Unsteadiness on feet (R26.81);Repeated falls (R29.6);Muscle weakness (generalized) (M62.81)    Time: 8997-8960 PT Time Calculation (min) (ACUTE ONLY): 37 min   Charges:   PT Evaluation $PT Eval Moderate  Complexity: 1 Mod PT Treatments $Therapeutic Activity: 23-37 mins PT General Charges $$ ACUTE PT VISIT: 1 Visit        Lunabelle Oatley, SPT

## 2024-08-15 NOTE — Plan of Care (Signed)

## 2024-08-16 ENCOUNTER — Other Ambulatory Visit (HOSPITAL_COMMUNITY): Payer: Self-pay

## 2024-08-16 ENCOUNTER — Telehealth (HOSPITAL_COMMUNITY): Payer: Self-pay | Admitting: Pharmacy Technician

## 2024-08-16 ENCOUNTER — Encounter: Payer: Self-pay | Admitting: Cardiology

## 2024-08-16 DIAGNOSIS — I5033 Acute on chronic diastolic (congestive) heart failure: Secondary | ICD-10-CM | POA: Diagnosis not present

## 2024-08-16 LAB — BASIC METABOLIC PANEL WITH GFR
Anion gap: 11 (ref 5–15)
BUN: 28 mg/dL — ABNORMAL HIGH (ref 8–23)
CO2: 29 mmol/L (ref 22–32)
Calcium: 9 mg/dL (ref 8.9–10.3)
Chloride: 100 mmol/L (ref 98–111)
Creatinine, Ser: 0.76 mg/dL (ref 0.44–1.00)
GFR, Estimated: >60 mL/min (ref >60)
Glucose, Bld: 125 mg/dL — ABNORMAL HIGH (ref 70–99)
Potassium: 3.7 mmol/L (ref 3.5–5.1)
Sodium: 140 mmol/L (ref 135–145)

## 2024-08-16 LAB — GLUCOSE, CAPILLARY
Glucose-Capillary: 146 mg/dL — ABNORMAL HIGH (ref 70–99)
Glucose-Capillary: 240 mg/dL — ABNORMAL HIGH (ref 70–99)
Glucose-Capillary: 262 mg/dL — ABNORMAL HIGH (ref 70–99)
Glucose-Capillary: 244 mg/dL — ABNORMAL HIGH (ref 70–99)

## 2024-08-16 LAB — PRO BRAIN NATRIURETIC PEPTIDE: Pro Brain Natriuretic Peptide: 2224 pg/mL — ABNORMAL HIGH (ref <300.0)

## 2024-08-16 MED ORDER — INSULIN GLARGINE-YFGN 100 UNIT/ML ~~LOC~~ SOLN
30.0000 [IU] | Freq: Every day | SUBCUTANEOUS | Status: DC
  Administered 2024-08-17: 30 [IU] via SUBCUTANEOUS
  Filled 2024-08-16 (×2): qty 0.3

## 2024-08-16 MED ORDER — POTASSIUM CHLORIDE CRYS ER 10 MEQ PO TBCR
10.0000 meq | EXTENDED_RELEASE_TABLET | Freq: Once | ORAL | Status: AC
  Administered 2024-08-16: 10 meq via ORAL
  Filled 2024-08-16: qty 1

## 2024-08-16 MED ORDER — FUROSEMIDE 10 MG/ML IJ SOLN
40.0000 mg | Freq: Two times a day (BID) | INTRAMUSCULAR | Status: AC
  Administered 2024-08-16: 40 mg via INTRAVENOUS
  Filled 2024-08-16: qty 4

## 2024-08-16 MED ORDER — AMLODIPINE BESYLATE 5 MG PO TABS
10.0000 mg | ORAL_TABLET | Freq: Every day | ORAL | Status: DC
  Administered 2024-08-16 – 2024-08-17 (×2): 10 mg via ORAL
  Filled 2024-08-16 (×2): qty 2

## 2024-08-16 NOTE — Progress Notes (Signed)
 PROGRESS NOTE    Patient: [name] [name]                            PCP: Medicine, [facility] Internal                    DOB: Jul 23, 1941            DOA: 08/14/2024 FMW:[id number]             DOS: 08/16/2024, 10:37 AM   LOS: 2 days   Date of Service: The patient was seen and examined on 08/16/2024  Subjective:   The patient was seen and examined this morning, stable no acute distress Complain shortness of breath with minimal exertion, generalized weakness Not O2 dependent at baseline, on this admission was up to 4 L, currently on 2 L of oxygen  satting 96% Reporting voiding well  Brief Narrative:   [name] [name] is a 83 y.o. female with medical history significant for peripheral artery disease status post left femoral embolectomy, type 2 diabetes, hypertension, OSA, chronic HFpEF, coronary artery disease status post PCI with stent placement, history of CVA, history of cellulitis and abscess of left lower extremity, status post debridement 07/28/2024 and 08/01/2024 with AMA, discharged home with home health services from [facility] yesterday and wants to be treated here at [facility], who presents to the ER due to sudden onset shortness of breath today.   ER, CT angio chest was negative for pulmonary embolism.  It showed mild interstitial edema.  Moderate bilateral pleural effusions, partially loculated on the left.  Mild thoracic lymphadenopathy, chronic likely reactive.  proBNP 908.  Hypervolemic on exam.   Received IV diuretics in the ER.  EDP requesting admission for management of acute on chronic HFpEF.    Assessment & Plan:   Principal Problem:   Acute on chronic diastolic (congestive) heart failure (HCC)  Acute on chronic HFpEF SATTING 100 % O 3-4 L>>> Down to 2 L of oxygen , satting 96% BNP 908.0 -- 1569.0 --- 2224.0   Reds Clip >40 --  Last 2D echo done on 06/18/2016 revealed LVEF 65 to 70% ECHO>>> preserved EF 65-70%, grade 3 diastolic dysfunction  ON Diuretics with IV Lasix  20 >>>  40 mg twice daily Monitor strict I's and O's and daily weight --voided greater than 2.7 L since admission  Intake/Output Summary (Last 24 hours) at 08/16/2024 1037 Last data filed at 08/16/2024 0452 Gross per 24 hour  Intake 480 ml  Output 1950 ml  Net -1470 ml   Filed Weights   08/15/24 0024 08/15/24 0500 08/16/24 0451  Weight: 80.1 kg 80 kg 76.8 kg      Type 2 diabetes with hyperglycemia -Starting long-acting insulin , reducing from 50 to 30 units nightly Checking CBG q. ACHS, SSI coverage, carb modified diet Last hemoglobin A1c 8.4 on 01/28/2020    Peripheral artery disease status post left femoral embolectomy Recently done at [facility] Close follow-up per discharge with vascular surgery. Resume home regimen -    Coronary artery disease -Continuing aspirin , Plavix ,     History of cellulitis and abscess of left lower extremity, status post debridement 07/28/2024 and 08/01/2024 with LMA Resume home Keflex Currently no leukocytosis and afebrile. Nontoxic-appearing   Hypertension -Hypertensive, resuming home medication including Norvasc , hydralazine , labetalol  Titrating meds - increased dose of Norvasc  to 10 mg    Obesity Body mass index is 28.18 kg/m. Recommend weight loss outpatient with regular physical activity and  healthy diet.   OSA- Resume home CPAP       -------------------------------------------------------------------------------------------------------------------------------------- Nutritional status:  The patient's BMI is: Body mass index is 28.18 kg/m. I agree with the assessment and plan as outlined  Nutrition Status: -------------------------------------------------------------------------------------------------------------------------------------- DVT prophylaxis:     Code Status:   Code Status: Full Code  Family Communication: No family member present at bedside-  -Advance care planning has been discussed.   Admission status:   Status  is: Inpatient Remains inpatient appropriate because: Needing IV diuretics   Disposition: From  - home             Planning for discharge in 1-2 days   Procedures:   No admission procedures for hospital encounter.   Antimicrobials:  Anti-infectives (From admission, onward)    Start     Dose/Rate Route Frequency Ordered Stop   08/15/24 1100  ciprofloxacin (CIPRO) tablet 500 mg  Status:  Discontinued        500 mg Oral 2 times daily 08/15/24 1009 08/15/24 1013   08/15/24 0615  cephALEXin (KEFLEX) capsule 500 mg        500 mg Oral Every 6 hours 08/15/24 0522          Medication:   amLODipine   10 mg Oral Daily   aspirin  EC  81 mg Oral Daily   cephALEXin  500 mg Oral Q6H   clopidogrel   75 mg Oral Daily   empagliflozin  25 mg Oral Daily   ezetimibe   10 mg Oral Daily   fluticasone  furoate-vilanterol  1 puff Inhalation Daily   furosemide   20 mg Intravenous BID   gabapentin  100 mg Oral Daily   hydrALAZINE   25 mg Oral BID   insulin  aspart  0-5 Units Subcutaneous QHS   insulin  aspart  0-9 Units Subcutaneous TID WC   insulin  glargine-yfgn  25 Units Subcutaneous Daily   labetalol   200 mg Oral BID   timolol  1 drop Both Eyes Daily    acetaminophen , hydrALAZINE , melatonin, nitroGLYCERIN , polyethylene glycol, prochlorperazine   Objective:   Vitals:   08/15/24 1300 08/15/24 2113 08/16/24 0451 08/16/24 0849  BP: (!) 151/50 (!) 188/56 (!) 179/43   Pulse: 62 62 66   Resp:      Temp:   98.5 F (36.9 C)   TempSrc:  Oral Oral   SpO2: 98% 96% 96% 96%  Weight:   76.8 kg   Height:   5' 5 (1.651 m)     Intake/Output Summary (Last 24 hours) at 08/16/2024 1037 Last data filed at 08/16/2024 0452 Gross per 24 hour  Intake 480 ml  Output 1950 ml  Net -1470 ml   Filed Weights   08/15/24 0024 08/15/24 0500 08/16/24 0451  Weight: 80.1 kg 80 kg 76.8 kg     Physical examination:       General:  AAO x 3,  cooperative, no distress;   HEENT:  Normocephalic, PERRL, otherwise  with in Normal limits   Neuro:  CNII-XII intact. , normal motor and sensation, reflexes intact   Lungs:   Clear to auscultation BL, Respirations unlabored,  No wheezes / crackles  Cardio:    S1/S2, RRR, No murmure, No Rubs or Gallops   Abdomen:  Soft, non-tender, bowel sounds active all four quadrants, no guarding or peritoneal signs.  Muscular  skeletal:  Limited exam -global generalized weaknesses - in bed, able to move all 4 extremities,   2+ pulses,  symmetric, +2  pitting edema  Skin:  Dry,  warm to touch, negative for any Rashes,  Wounds: Please see nursing documentation            ------------------------------------------------------------------------------------------------------------------------------------------    LABs:     Latest Ref Rng & Units 08/15/2024    4:38 AM 08/14/2024    7:22 PM 01/29/2020    6:03 AM  CBC  WBC 4.0 - 10.5 K/uL 6.1  9.2  5.6   Hemoglobin 12.0 - 15.0 g/dL 89.3  89.6  87.2   Hematocrit 36.0 - 46.0 % 34.0  32.9  39.2   Platelets 150 - 400 K/uL 270  268  271       Latest Ref Rng & Units 08/16/2024    4:42 AM 08/15/2024    4:38 AM 08/14/2024    7:22 PM  CMP  Glucose 70 - 99 mg/dL 874  741  761   BUN 8 - 23 mg/dL 28  21  17    Creatinine 0.44 - 1.00 mg/dL 9.23  9.31  9.28   Sodium 135 - 145 mmol/L 140  137  137   Potassium 3.5 - 5.1 mmol/L 3.7  4.9  5.2   Chloride 98 - 111 mmol/L 100  97  99   CO2 22 - 32 mmol/L 29  21  25    Calcium  8.9 - 10.3 mg/dL 9.0  9.5  9.4   Total Protein 6.5 - 8.1 g/dL   7.1   Total Bilirubin 0.0 - 1.2 mg/dL   0.8   Alkaline Phos 38 - 126 U/L   136   AST 15 - 41 U/L   33   ALT 0 - 44 U/L   23        Micro Results Recent Results (from the past 240 hours)  Resp panel by RT-PCR (RSV, Flu A&B, Covid) Anterior Nasal Swab     Status: None   Collection Time: 08/14/24 10:05 PM   Specimen: Anterior Nasal Swab  Result Value Ref Range Status   SARS Coronavirus 2 by RT PCR NEGATIVE NEGATIVE Final    Comment:  (NOTE) SARS-CoV-2 target nucleic acids are NOT DETECTED.  The SARS-CoV-2 RNA is generally detectable in upper respiratory specimens during the acute phase of infection. The lowest concentration of SARS-CoV-2 viral copies this assay can detect is 138 copies/mL. A negative result does not preclude SARS-Cov-2 infection and should not be used as the sole basis for treatment or other patient management decisions. A negative result may occur with  improper specimen collection/handling, submission of specimen other than nasopharyngeal swab, presence of viral mutation(s) within the areas targeted by this assay, and inadequate number of viral copies(<138 copies/mL). A negative result must be combined with clinical observations, patient history, and epidemiological information. The expected result is Negative.  Fact Sheet for Patients:  bloggercourse.com  Fact Sheet for Healthcare Providers:  seriousbroker.it  This test is no t yet approved or cleared by the [place]  FDA and  has been authorized for detection and/or diagnosis of SARS-CoV-2 by FDA under an Emergency Use Authorization (EUA). This EUA will remain  in effect (meaning this test can be used) for the duration of the COVID-19 declaration under Section 564(b)(1) of the Act, 21 U.S.C.section 360bbb-3(b)(1), unless the authorization is terminated  or revoked sooner.       Influenza A by PCR NEGATIVE NEGATIVE Final   Influenza B by PCR NEGATIVE NEGATIVE Final    Comment: (NOTE) The Xpert Xpress SARS-CoV-2/FLU/RSV plus assay is intended as an aid in the diagnosis of influenza  from Nasopharyngeal swab specimens and should not be used as a sole basis for treatment. Nasal washings and aspirates are unacceptable for Xpert Xpress SARS-CoV-2/FLU/RSV testing.  Fact Sheet for Patients: bloggercourse.com  Fact Sheet for Healthcare  Providers: seriousbroker.it  This test is not yet approved or cleared by the [place]  FDA and has been authorized for detection and/or diagnosis of SARS-CoV-2 by FDA under an Emergency Use Authorization (EUA). This EUA will remain in effect (meaning this test can be used) for the duration of the COVID-19 declaration under Section 564(b)(1) of the Act, 21 U.S.C. section 360bbb-3(b)(1), unless the authorization is terminated or revoked.     Resp Syncytial Virus by PCR NEGATIVE NEGATIVE Final    Comment: (NOTE) Fact Sheet for Patients: bloggercourse.com  Fact Sheet for Healthcare Providers: seriousbroker.it  This test is not yet approved or cleared by the [place]  FDA and has been authorized for detection and/or diagnosis of SARS-CoV-2 by FDA under an Emergency Use Authorization (EUA). This EUA will remain in effect (meaning this test can be used) for the duration of the COVID-19 declaration under Section 564(b)(1) of the Act, 21 U.S.C. section 360bbb-3(b)(1), unless the authorization is terminated or revoked.  Performed at [facility], [address] [place], [place] [address]     Radiology Reports ECHOCARDIOGRAM COMPLETE Result Date: 08/15/2024    ECHOCARDIOGRAM REPORT   Patient Name:   [name] [name] Date of Exam: 08/15/2024 Medical Rec #:  [id number]       Height:       62.0 in Accession #:    [id number]      Weight:       176.4 lb Date of Birth:  December 05, 1940       BSA:          1.812 m Patient Age:    83 years        BP:           151/50 mmHg Patient Gender: F               HR:           64 bpm. Exam Location:  [facility] Procedure: 2D Echo, Color Doppler and Cardiac Doppler (Both Spectral and Color            Flow Doppler were utilized during procedure). Indications:    CHF I50.31  History:        Patient has prior history of Echocardiogram examinations, most                 recent 01/29/2020.  CAD, COPD; Risk Factors:Diabetes and                 Hypertension.  Sonographer:    Tinnie Gosling RDCS Referring Phys: [phone] Meiko Stranahan A Saachi Zale IMPRESSIONS  1. Left ventricular ejection fraction, by estimation, is 65 to 70%. The left ventricle has normal function. The left ventricle has no regional wall motion abnormalities. There is mild concentric left ventricular hypertrophy. Left ventricular diastolic parameters are consistent with Grade III diastolic dysfunction (restrictive). Elevated left atrial pressure.  2. Right ventricular systolic function is normal. The right ventricular size is normal. There is normal pulmonary artery systolic pressure. The estimated right ventricular systolic pressure is 34.4 mmHg.  3. Left atrial size was mildly dilated.  4. The mitral valve is degenerative. Mild mitral valve regurgitation.  5. The aortic valve is tricuspid. There is mild calcification of the aortic valve. Aortic valve regurgitation is not visualized. Aortic  valve sclerosis/calcification is present, without any evidence of aortic stenosis.  6. The inferior vena cava is normal in size with <50% respiratory variability, suggesting right atrial pressure of 8 mmHg. Comparison(s): Prior images reviewed side by side. LVEF 65-70% with grade 3 diastolic dysfunction. FINDINGS  Left Ventricle: Left ventricular ejection fraction, by estimation, is 65 to 70%. The left ventricle has normal function. The left ventricle has no regional wall motion abnormalities. The left ventricular internal cavity size was normal in size. There is  mild concentric left ventricular hypertrophy. Left ventricular diastolic parameters are consistent with Grade III diastolic dysfunction (restrictive). Elevated left atrial pressure. Right Ventricle: The right ventricular size is normal. No increase in right ventricular wall thickness. Right ventricular systolic function is normal. There is normal pulmonary artery systolic pressure. The tricuspid  regurgitant velocity is 2.57 m/s, and  with an assumed right atrial pressure of 8 mmHg, the estimated right ventricular systolic pressure is 34.4 mmHg. Left Atrium: Left atrial size was mildly dilated. Right Atrium: Right atrial size was normal in size. Pericardium: There is no evidence of pericardial effusion. Mitral Valve: The mitral valve is degenerative in appearance. Mild mitral annular calcification. Mild mitral valve regurgitation. Tricuspid Valve: The tricuspid valve is grossly normal. Tricuspid valve regurgitation is mild. Aortic Valve: The aortic valve is tricuspid. There is mild calcification of the aortic valve. There is mild aortic valve annular calcification. Aortic valve regurgitation is not visualized. Aortic valve sclerosis/calcification is present, without any evidence of aortic stenosis. Pulmonic Valve: The pulmonic valve was grossly normal. Pulmonic valve regurgitation is trivial. Aorta: The aortic root and ascending aorta are structurally normal, with no evidence of dilitation. Venous: The inferior vena cava is normal in size with less than 50% respiratory variability, suggesting right atrial pressure of 8 mmHg. IAS/Shunts: No atrial level shunt detected by color flow Doppler. Additional Comments: 3D was performed not requiring image post processing on an independent workstation and was indeterminate.  LEFT VENTRICLE PLAX 2D LVIDd:         4.40 cm   Diastology LVIDs:         2.50 cm   LV e' medial:    7.18 cm/s LV PW:         1.10 cm   LV E/e' medial:  27.3 LV IVS:        1.10 cm   LV e' lateral:   6.64 cm/s LVOT diam:     1.80 cm   LV E/e' lateral: 29.5 LV SV:         73 LV SV Index:   40 LVOT Area:     2.54 cm LV IVRT:       55 msec  RIGHT VENTRICLE             IVC RV S prime:     14.50 cm/s  IVC diam: 1.70 cm TAPSE (M-mode): 2.6 cm                             PULMONARY VEINS                             Diastolic Velocity: 111.00 cm/s                             S/D Velocity:       0.40  Systolic Velocity:  40.80 cm/s LEFT ATRIUM             Index        RIGHT ATRIUM           Index LA diam:        3.50 cm 1.93 cm/m   RA Area:     13.50 cm LA Vol (A2C):   81.6 ml 45.03 ml/m  RA Volume:   28.80 ml  15.89 ml/m LA Vol (A4C):   59.2 ml 32.67 ml/m LA Biplane Vol: 69.7 ml 38.46 ml/m  AORTIC VALVE LVOT Vmax:   117.00 cm/s LVOT Vmean:  82.700 cm/s LVOT VTI:    0.285 m  AORTA Ao Root diam: 2.40 cm Ao Asc diam:  2.50 cm MITRAL VALVE                TRICUSPID VALVE MV Area (PHT): 3.72 cm     TR Peak grad:   26.4 mmHg MV Decel Time: 204 msec     TR Vmax:        257.00 cm/s MV E velocity: 196.00 cm/s MV A velocity: 73.20 cm/s   SHUNTS MV E/A ratio:  2.68         Systemic VTI:  0.29 m                             Systemic Diam: 1.80 cm Jayson Sierras MD Electronically signed by Jayson Sierras MD Signature Date/Time: 08/15/2024/4:00:15 PM    Final     SIGNED: Adriana DELENA Grams, MD, FHM. FAAFP. Jolynn Pack - [facility] Time spent - 55 min.  In seeing, evaluating and examining the patient. Reviewing medical records, labs, drawn plan of care. [facility],  Pager (please use [email or web address] to page/ text) Please use Epic Secure Chat for non-urgent communication (7AM-7PM)  If 7PM-7AM, please contact night-coverage [email or web address], 08/16/2024, 10:37 AM

## 2024-08-16 NOTE — Inpatient Diabetes Management (Signed)
 Inpatient Diabetes Program Recommendations  AACE/ADA: New Consensus Statement on Inpatient Glycemic Control (2015)  Target Ranges:  Prepandial:   less than 140 mg/dL      Peak postprandial:   less than 180 mg/dL (1-2 hours)      Critically ill patients:  140 - 180 mg/dL   Lab Results  Component Value Date   GLUCAP 240 (H) 08/16/2024   HGBA1C 7.4 (H) 08/14/2024    Latest Reference Range & Units 08/15/24 07:15 08/15/24 11:47 08/15/24 16:23 08/15/24 20:59 08/16/24 07:33 08/16/24 11:33  Glucose-Capillary 70 - 99 mg/dL 744 (H) 649 (H) 699 (H) 306 (H) 146 (H) 240 (H)  (H): Data is abnormally high  Diabetes history: DM2 Outpatient Diabetes medications:  Lantus  50 units daily Humalog  16 units tid Jardiance 25 mg daily   Current orders for Inpatient glycemic control:  Lantus  30 to start 11/14 Novolog  0-9 units tid, 0-5 units hs Jardiance 25 mg daily  Inpatient Diabetes Program Recommendations:   Postprandial CBGs elevated. Please consider: -Add Novolog  4 units tid meal coverage if eats 50%  Thank you, Aadit Hagood E. Geralyn Figiel, RN, MSN, CNS, CDCES  Diabetes Coordinator Inpatient Glycemic Control Team Team Pager [phone] (8am-5pm) 08/16/2024 11:49 AM

## 2024-08-16 NOTE — Telephone Encounter (Signed)
 Patient Product/process Development Scientist completed.    The patient is insured through [facility] Patient has Medicare and is not eligible for a copay card, but may be able to apply for patient assistance or Medicare RX Payment Plan (Patient Must reach out to their plan, if eligible for payment plan), if available.    Ran test claim for Jardiance 25 mg and the current 30 day co-pay is $0.00.   This test claim was processed through [facility]- copay amounts may vary at other pharmacies due to pharmacy/plan contracts, or as the patient moves through the different stages of their insurance plan.     Reyes Sharps, CPHT Pharmacy Technician Patient Advocate Specialist Lead [facility] Patient Advocate Team Direct Number: [phone]  Fax: [phone]

## 2024-08-16 NOTE — Plan of Care (Signed)

## 2024-08-16 NOTE — Progress Notes (Signed)
 Mobility Specialist Progress Note:    08/16/24 1100  Mobility  Activity Ambulated with assistance  Level of Assistance Standby assist, set-up cues, supervision of patient - no hands on  Assistive Device Front wheel walker  Distance Ambulated (ft) 180 ft  Range of Motion/Exercises Active;All extremities  Activity Response Tolerated well  Mobility Referral Yes  Mobility visit 1 Mobility  Mobility Specialist Start Time (ACUTE ONLY) 1100  Mobility Specialist Stop Time (ACUTE ONLY) 1120  Mobility Specialist Time Calculation (min) (ACUTE ONLY) 20 min   Pt received in chair, agreeable to mobility. Required supervision to stand and ambulate with RW. Tolerated well, denies SOB and stated she feels fine. SpO2 90% on RA at rest, SpO2 90-91% on RA during ambulation. Left in bathroom, NT notified. All needs met.  Keeli Roberg Mobility Specialist Please contact via Special Educational Needs Teacher or  Rehab office at [phone]

## 2024-08-16 NOTE — Plan of Care (Signed)
  Problem: Education: Goal: Knowledge of General Education information will improve Description: Including pain rating scale, medication(s)/side effects and non-pharmacologic comfort measures Outcome: Progressing   Problem: Health Behavior/Discharge Planning: Goal: Ability to manage health-related needs will improve Outcome: Progressing   Problem: Clinical Measurements: Goal: Ability to maintain clinical measurements within normal limits will improve Outcome: Progressing Goal: Will remain free from infection Outcome: Progressing Goal: Diagnostic test results will improve Outcome: Progressing Goal: Respiratory complications will improve Outcome: Progressing Goal: Cardiovascular complication will be avoided Outcome: Progressing   Problem: Activity: Goal: Risk for activity intolerance will decrease Outcome: Progressing   Problem: Nutrition: Goal: Adequate nutrition will be maintained Outcome: Progressing   Problem: Coping: Goal: Level of anxiety will decrease Outcome: Progressing   Problem: Elimination: Goal: Will not experience complications related to bowel motility Outcome: Progressing Goal: Will not experience complications related to urinary retention Outcome: Progressing   Problem: Pain Managment: Goal: General experience of comfort will improve and/or be controlled Outcome: Progressing   Problem: Safety: Goal: Ability to remain free from injury will improve Outcome: Progressing   Problem: Skin Integrity: Goal: Risk for impaired skin integrity will decrease Outcome: Progressing   Problem: Education: Goal: Ability to describe self-care measures that may prevent or decrease complications (Diabetes Survival Skills Education) will improve Outcome: Progressing Goal: Individualized Educational Video(s) Outcome: Progressing   Problem: Coping: Goal: Ability to adjust to condition or change in health will improve Outcome: Progressing   Problem: Health  Behavior/Discharge Planning: Goal: Ability to identify and utilize available resources and services will improve Outcome: Progressing Goal: Ability to manage health-related needs will improve Outcome: Progressing   Problem: Metabolic: Goal: Ability to maintain appropriate glucose levels will improve Outcome: Progressing   Problem: Nutritional: Goal: Maintenance of adequate nutrition will improve Outcome: Progressing Goal: Progress toward achieving an optimal weight will improve Outcome: Progressing   Problem: Skin Integrity: Goal: Risk for impaired skin integrity will decrease Outcome: Progressing   Problem: Tissue Perfusion: Goal: Adequacy of tissue perfusion will improve Outcome: Progressing

## 2024-08-17 ENCOUNTER — Other Ambulatory Visit (HOSPITAL_BASED_OUTPATIENT_CLINIC_OR_DEPARTMENT_OTHER): Payer: Self-pay

## 2024-08-17 DIAGNOSIS — Z794 Long term (current) use of insulin: Secondary | ICD-10-CM | POA: Diagnosis not present

## 2024-08-17 DIAGNOSIS — I5033 Acute on chronic diastolic (congestive) heart failure: Secondary | ICD-10-CM | POA: Diagnosis not present

## 2024-08-17 DIAGNOSIS — E119 Type 2 diabetes mellitus without complications: Secondary | ICD-10-CM

## 2024-08-17 DIAGNOSIS — J9601 Acute respiratory failure with hypoxia: Secondary | ICD-10-CM | POA: Diagnosis not present

## 2024-08-17 LAB — BASIC METABOLIC PANEL WITH GFR
Anion gap: 13 (ref 5–15)
BUN: 22 mg/dL (ref 8–23)
CO2: 29 mmol/L (ref 22–32)
Calcium: 9.1 mg/dL (ref 8.9–10.3)
Chloride: 99 mmol/L (ref 98–111)
Creatinine, Ser: 0.64 mg/dL (ref 0.44–1.00)
GFR, Estimated: >60 mL/min (ref >60)
Glucose, Bld: 157 mg/dL — ABNORMAL HIGH (ref 70–99)
Potassium: 3.5 mmol/L (ref 3.5–5.1)
Sodium: 140 mmol/L (ref 135–145)

## 2024-08-17 LAB — PRO BRAIN NATRIURETIC PEPTIDE: Pro Brain Natriuretic Peptide: 1362 pg/mL — ABNORMAL HIGH (ref <300.0)

## 2024-08-17 LAB — GLUCOSE, CAPILLARY
Glucose-Capillary: 170 mg/dL — ABNORMAL HIGH (ref 70–99)
Glucose-Capillary: 254 mg/dL — ABNORMAL HIGH (ref 70–99)

## 2024-08-17 MED ORDER — LANTUS SOLOSTAR 100 UNIT/ML ~~LOC~~ SOPN: 30.0000 [IU] | PEN_INJECTOR | Freq: Every day | SUBCUTANEOUS | Status: DC

## 2024-08-17 MED ORDER — AMLODIPINE BESYLATE 10 MG PO TABS
10.0000 mg | ORAL_TABLET | Freq: Every day | ORAL | 0 refills | Status: DC
  Filled 2024-08-17: qty 30, 30d supply, fill #0

## 2024-08-17 MED ORDER — FUROSEMIDE 20 MG PO TABS
40.0000 mg | ORAL_TABLET | Freq: Every day | ORAL | 0 refills | Status: DC
  Filled 2024-08-17: qty 60, 30d supply, fill #0

## 2024-08-17 NOTE — Discharge Summary (Signed)
 Physician Discharge Summary   Patient: [name] [name] MRN: [id number] DOB: 10/19/1940  Admit date:     08/14/2024  Discharge date: 08/17/24  Discharge Physician: Carliss LELON Canales   PCP: Medicine, [facility] Internal   Recommendations at discharge:    Pt to be discharged home with home health.   If you experience worsening fever, chills, chest pain, shortness of breath, or other concerning symptoms, please call your PCP or go to the emergency department immediately.  Discharge Diagnoses: Principal Problem:   Acute on chronic diastolic (congestive) heart failure (HCC)  Resolved Problems:   * No resolved hospital problems. *   Hospital Course:   83 y.o. female with medical history significant for peripheral artery disease status post left femoral embolectomy, type 2 diabetes, hypertension, OSA, chronic HFpEF, coronary artery disease status post PCI with stent placement, history of CVA, history of cellulitis and abscess of left lower extremity, status post debridement 07/28/2024 and 08/01/2024 with AMA, discharged home with home health services from [facility] yesterday and wants to be treated here at [facility], who presents to the ER due to sudden onset shortness of breath today.   ER, CT angio chest was negative for pulmonary embolism.  It showed mild interstitial edema.  Moderate bilateral pleural effusions, partially loculated on the left.  Mild thoracic lymphadenopathy, chronic likely reactive.  proBNP 908.  Hypervolemic on exam.   Received IV diuretics in the ER.  EDP requesting admission for management of acute on chronic HFpEF.  Assessment and Plan:  Acute hypoxic respiratory failure - Initially requiring 3-4 L nasal cannula, able to be weaned down to room air throughout the course of hospital stay.  Acute exacerbation of chronic HFpEF -Echo noting preserved EF 65-70%, grade 3 diastolic dysfunction.  Responded well to IV Lasix  throughout the course hospital stay, net -7 L.  Transition to p.o.  Lasix  40 mg daily upon discharge.  Recommend follow-up with PCP/cardiology in the outpatient setting.  Diabetes mellitus - A1c 7.4 suggesting only moderate control.  Precarious while inpatient.  Reduced long-acting insulin  to 30 units daily.  Continue previous meal coverage and other diabetic regimen.  Informed patient about long-term consequences of uncontrolled diabetes including advanced kidney disease, blindness, neuropathy, and increased cardiovascular risk of stroke and heart attack.   Recommend yearly foot/eye/urine protein exams.  History of cellulitis and abscess LLE - Resume previous Keflex.  History PAD s/p left femoral embolectomy - Recently done at [facility].  Has follow-up with vascular surgery.  CAD - Resume DAPT with aspirin , Plavix .  Hypertension - Resume medication regimen.  Norvasc  increased to 10 mg.  OSA - Resume home CPAP.  Physical debilitation muscle weakness - Evaluated by PT, recommending home health.  Consultants: None Procedures performed: None Disposition: Home health Diet recommendation:  Discharge Diet Orders (From admission, onward)     Start     Ordered   08/17/24 0000  Diet - low sodium heart healthy        08/17/24 1145           Cardiac and Carb modified diet  DISCHARGE MEDICATION: Allergies as of 08/17/2024       Reactions   Alendronate Swelling, Other (See Comments)   Leg edema   Lisinopril  Anaphylaxis, Swelling, Other (See Comments)   Tongue swelling   Losartan Swelling, Other (See Comments)   Tongue swelling   Clonidine Derivatives Other (See Comments)   Dizziness    Codeine Nausea And Vomiting   Hydrocodone Nausea And Vomiting   Hydrocodone-acetaminophen   Nausea And Vomiting, Nausea Only   Pioglitazone Swelling   Swelling of ankles   Ramelteon Nausea And Vomiting, Nausea Only   Rosuvastatin  Other (See Comments)   Muscle cramps   Valsartan Nausea And Vomiting, Other (See Comments)   Dizziness        Medication List      STOP taking these medications    ciprofloxacin 500 MG tablet Commonly known as: CIPRO   cyclobenzaprine 5 MG tablet Commonly known as: FLEXERIL   fluticasone  50 MCG/ACT nasal spray Commonly known as: FLONASE   Symbicort 160-4.5 MCG/ACT inhaler Generic drug: budesonide-formoterol    traMADol 50 MG tablet Commonly known as: ULTRAM       TAKE these medications    amLODipine  10 MG tablet Commonly known as: NORVASC  Take 1 tablet (10 mg total) by mouth daily. Start taking on: August 18, 2024 What changed:  medication strength See the new instructions.   aspirin  EC 81 MG tablet Take 81 mg by mouth daily.   cephALEXin 500 MG capsule Commonly known as: KEFLEX Take 500 mg by mouth every 6 (six) hours.   clopidogrel  75 MG tablet Commonly known as: PLAVIX  take 1 tablet by mouth daily   ezetimibe  10 MG tablet Commonly known as: ZETIA  take 1 tablet (10 MILLIGRAM total) by mouth daily.   furosemide  20 MG tablet Commonly known as: LASIX  Take 2 tablets (40 mg total) by mouth daily. What changed: how much to take   gabapentin 100 MG capsule Commonly known as: NEURONTIN Take 100 mg by mouth daily.   HumaLOG  KwikPen 200 UNIT/ML KwikPen Generic drug: insulin  lispro Inject 16 Units into the skin with breakfast, with lunch, and with evening meal.   hydrALAZINE  25 MG tablet Commonly known as: APRESOLINE  Take 1 tablet (25 mg total) by mouth 2 (two) times daily.   icosapent  Ethyl 1 g capsule Commonly known as: Vascepa  Take 1 capsule (1 g total) by mouth 2 (two) times daily.   Jardiance 25 MG Tabs tablet Generic drug: empagliflozin Take 25 mg by mouth daily.   labetalol  200 MG tablet Commonly known as: NORMODYNE  Take 1 tablet (200 mg total) by mouth 2 (two) times daily.   Lantus  SoloStar 100 UNIT/ML Solostar Pen Generic drug: insulin  glargine Inject 18-50 Units into the skin See admin instructions. Inject 18 units in lhe morning and 50 units every evening    Lumigan 0.01 % Soln Generic drug: bimatoprost Place 1 drop into both eyes at bedtime.   nitroGLYCERIN  0.4 MG SL tablet Commonly known as: NITROSTAT  Place 1 tablet (0.4 mg total) under the tongue every 5 (five) minutes as needed for chest pain. What changed: when to take this   oxyCODONE -acetaminophen  10-325 MG tablet Commonly known as: PERCOCET Take 1 tablet by mouth 2 (two) times daily.   timolol 0.5 % ophthalmic solution Commonly known as: TIMOPTIC Place 1 drop into both eyes daily.               Discharge Care Instructions  (From admission, onward)           Start     Ordered   08/17/24 0000  Discharge wound care:       Comments: Cleanse left groin wound with NS and pat dry.  Apply NS moist gauze to wound bed and cover with Dry gauze and tape.  Change daily and PRN soilage from drainage.   08/17/24 1145            Contact information for after-discharge care  Home Medical Care     [facility]) .   Service: Home Health Services Contact information: [phone] [place]  [address] [phone]                     Discharge Exam: Fredricka Weights   08/15/24 0500 08/16/24 0451 08/17/24 0435  Weight: 80 kg 76.8 kg 75.7 kg    GENERAL:  Alert, pleasant, no acute distress, frail HEENT:  EOMI CARDIOVASCULAR:  RRR, no murmurs appreciated RESPIRATORY:  Clear to auscultation, no wheezing, rales, or rhonchi GASTROINTESTINAL:  Soft, nontender, nondistended EXTREMITIES: Trace LE edema bilaterally NEURO:  No new focal deficits appreciated SKIN:  No rashes noted PSYCH:  Appropriate mood and affect     Condition at discharge: improving  The results of significant diagnostics from this hospitalization (including imaging, microbiology, ancillary and laboratory) are listed below for reference.   Imaging Studies: ECHOCARDIOGRAM COMPLETE Result Date: 08/15/2024    ECHOCARDIOGRAM REPORT   Patient Name:    SHIRLENE ANDAYA Date of Exam: 08/15/2024 Medical Rec #:  [id number]       Height:       62.0 in Accession #:    [id number]      Weight:       176.4 lb Date of Birth:  1941/01/24       BSA:          1.812 m Patient Age:    83 years        BP:           151/50 mmHg Patient Gender: F               HR:           64 bpm. Exam Location:  [facility] Procedure: 2D Echo, Color Doppler and Cardiac Doppler (Both Spectral and Color            Flow Doppler were utilized during procedure). Indications:    CHF I50.31  History:        Patient has prior history of Echocardiogram examinations, most                 recent 01/29/2020. CAD, COPD; Risk Factors:Diabetes and                 Hypertension.  Sonographer:    Tinnie Gosling RDCS Referring Phys: [phone] SEYED A SHAHMEHDI IMPRESSIONS  1. Left ventricular ejection fraction, by estimation, is 65 to 70%. The left ventricle has normal function. The left ventricle has no regional wall motion abnormalities. There is mild concentric left ventricular hypertrophy. Left ventricular diastolic parameters are consistent with Grade III diastolic dysfunction (restrictive). Elevated left atrial pressure.  2. Right ventricular systolic function is normal. The right ventricular size is normal. There is normal pulmonary artery systolic pressure. The estimated right ventricular systolic pressure is 34.4 mmHg.  3. Left atrial size was mildly dilated.  4. The mitral valve is degenerative. Mild mitral valve regurgitation.  5. The aortic valve is tricuspid. There is mild calcification of the aortic valve. Aortic valve regurgitation is not visualized. Aortic valve sclerosis/calcification is present, without any evidence of aortic stenosis.  6. The inferior vena cava is normal in size with <50% respiratory variability, suggesting right atrial pressure of 8 mmHg. Comparison(s): Prior images reviewed side by side. LVEF 65-70% with grade 3 diastolic dysfunction. FINDINGS  Left Ventricle: Left ventricular  ejection fraction, by estimation, is 65 to 70%. The left ventricle has  normal function. The left ventricle has no regional wall motion abnormalities. The left ventricular internal cavity size was normal in size. There is  mild concentric left ventricular hypertrophy. Left ventricular diastolic parameters are consistent with Grade III diastolic dysfunction (restrictive). Elevated left atrial pressure. Right Ventricle: The right ventricular size is normal. No increase in right ventricular wall thickness. Right ventricular systolic function is normal. There is normal pulmonary artery systolic pressure. The tricuspid regurgitant velocity is 2.57 m/s, and  with an assumed right atrial pressure of 8 mmHg, the estimated right ventricular systolic pressure is 34.4 mmHg. Left Atrium: Left atrial size was mildly dilated. Right Atrium: Right atrial size was normal in size. Pericardium: There is no evidence of pericardial effusion. Mitral Valve: The mitral valve is degenerative in appearance. Mild mitral annular calcification. Mild mitral valve regurgitation. Tricuspid Valve: The tricuspid valve is grossly normal. Tricuspid valve regurgitation is mild. Aortic Valve: The aortic valve is tricuspid. There is mild calcification of the aortic valve. There is mild aortic valve annular calcification. Aortic valve regurgitation is not visualized. Aortic valve sclerosis/calcification is present, without any evidence of aortic stenosis. Pulmonic Valve: The pulmonic valve was grossly normal. Pulmonic valve regurgitation is trivial. Aorta: The aortic root and ascending aorta are structurally normal, with no evidence of dilitation. Venous: The inferior vena cava is normal in size with less than 50% respiratory variability, suggesting right atrial pressure of 8 mmHg. IAS/Shunts: No atrial level shunt detected by color flow Doppler. Additional Comments: 3D was performed not requiring image post processing on an independent workstation and was  indeterminate.  LEFT VENTRICLE PLAX 2D LVIDd:         4.40 cm   Diastology LVIDs:         2.50 cm   LV e' medial:    7.18 cm/s LV PW:         1.10 cm   LV E/e' medial:  27.3 LV IVS:        1.10 cm   LV e' lateral:   6.64 cm/s LVOT diam:     1.80 cm   LV E/e' lateral: 29.5 LV SV:         73 LV SV Index:   40 LVOT Area:     2.54 cm LV IVRT:       55 msec  RIGHT VENTRICLE             IVC RV S prime:     14.50 cm/s  IVC diam: 1.70 cm TAPSE (M-mode): 2.6 cm                             PULMONARY VEINS                             Diastolic Velocity: 111.00 cm/s                             S/D Velocity:       0.40                             Systolic Velocity:  40.80 cm/s LEFT ATRIUM             Index        RIGHT ATRIUM  Index LA diam:        3.50 cm 1.93 cm/m   RA Area:     13.50 cm LA Vol (A2C):   81.6 ml 45.03 ml/m  RA Volume:   28.80 ml  15.89 ml/m LA Vol (A4C):   59.2 ml 32.67 ml/m LA Biplane Vol: 69.7 ml 38.46 ml/m  AORTIC VALVE LVOT Vmax:   117.00 cm/s LVOT Vmean:  82.700 cm/s LVOT VTI:    0.285 m  AORTA Ao Root diam: 2.40 cm Ao Asc diam:  2.50 cm MITRAL VALVE                TRICUSPID VALVE MV Area (PHT): 3.72 cm     TR Peak grad:   26.4 mmHg MV Decel Time: 204 msec     TR Vmax:        257.00 cm/s MV E velocity: 196.00 cm/s MV A velocity: 73.20 cm/s   SHUNTS MV E/A ratio:  2.68         Systemic VTI:  0.29 m                             Systemic Diam: 1.80 cm Jayson Sierras MD Electronically signed by Jayson Sierras MD Signature Date/Time: 08/15/2024/4:00:15 PM    Final    CT Angio Chest PE W/Cm &/Or Wo Cm Result Date: 08/14/2024 EXAM: CTA of the Chest with contrast for PE 08/14/2024 09:27:08 PM TECHNIQUE: CTA of the chest was performed after the administration of 75 mL of iohexol  (OMNIPAQUE ) 350 MG/ML injection. Multiplanar reformatted images are provided for review. MIP images are provided for review. Automated exposure control, iterative reconstruction, and/or weight based adjustment of the  mA/kV was utilized to reduce the radiation dose to as low as reasonably achievable. COMPARISON: Cardiac CT 02/28/2018 CLINICAL HISTORY: Pulmonary embolism (PE) suspected, high prob. FINDINGS: PULMONARY ARTERIES: Pulmonary arteries are adequately opacified for evaluation. No pulmonary embolism. Main pulmonary artery is normal in caliber. MEDIASTINUM: The heart and pericardium demonstrate no acute abnormality. Moderate 3-vessel coronary atherosclerosis. Thoracic aortic atherosclerosis. LYMPH NODES: Thoracic lymphadenopathy, including a 19 mm short axis low right paratracheal node, a 16 mm short axis right hilar node, and an 18 mm short axis subcarinal node partially visualized but favored to be unchanged from prior. LUNGS AND PLEURA: Mild compressive atelectasis in the bilateral lower lobes. Interlobular septal thickening, suggesting mild interstitial edema. Moderate bilateral pleural effusions, partially loculated on the left. No pneumothorax. UPPER ABDOMEN: Limited images of the upper abdomen are unremarkable. SOFT TISSUES AND BONES: Cervical spine fixation hardware, incompletely visualized. No acute soft tissue abnormality. IMPRESSION: 1. No pulmonary embolism. 2. Mild interstitial edema. Moderate bilateral pleural effusions, partially loculated on the left. 3. Mild thoracic lymphadenopathy, chronic, likely reactive. Electronically signed by: Pinkie Pebbles MD 08/14/2024 09:34 PM EST RP Workstation: [id number]   DG Chest Port 1 View Result Date: 08/14/2024 EXAM: 1 VIEW(S) XRAY OF THE CHEST 08/14/2024 07:24:18 PM COMPARISON: 01/28/2020 CLINICAL HISTORY: sob FINDINGS: LUNGS AND PLEURA: Bilateral interstitial prominence consistent with pulmonary edema. Focal airspace opacity in left lower lateral lung. Small bilateral pleural effusions. HEART AND MEDIASTINUM: Cardiomegaly. Aortic calcification. BONES AND SOFT TISSUES: Cervical spine surgical hardware. Old bilateral rib fractures. IMPRESSION: 1. Bilateral  interstitial prominence consistent with pulmonary edema with small bilateral pleural effusions. 2. Focal airspace opacity in the left lower lateral lung, which could represent superimposed infection or atelectasis. 3. Cardiomegaly. Electronically signed by: Oneil Devonshire MD 08/14/2024 07:27 PM  EST RP Workstation: [id number]    Microbiology: Results for orders placed or performed during the hospital encounter of 08/14/24  Resp panel by RT-PCR (RSV, Flu A&B, Covid) Anterior Nasal Swab     Status: None   Collection Time: 08/14/24 10:05 PM   Specimen: Anterior Nasal Swab  Result Value Ref Range Status   SARS Coronavirus 2 by RT PCR NEGATIVE NEGATIVE Final    Comment: (NOTE) SARS-CoV-2 target nucleic acids are NOT DETECTED.  The SARS-CoV-2 RNA is generally detectable in upper respiratory specimens during the acute phase of infection. The lowest concentration of SARS-CoV-2 viral copies this assay can detect is 138 copies/mL. A negative result does not preclude SARS-Cov-2 infection and should not be used as the sole basis for treatment or other patient management decisions. A negative result may occur with  improper specimen collection/handling, submission of specimen other than nasopharyngeal swab, presence of viral mutation(s) within the areas targeted by this assay, and inadequate number of viral copies(<138 copies/mL). A negative result must be combined with clinical observations, patient history, and epidemiological information. The expected result is Negative.  Fact Sheet for Patients:  bloggercourse.com  Fact Sheet for Healthcare Providers:  seriousbroker.it  This test is no t yet approved or cleared by the [place]  FDA and  has been authorized for detection and/or diagnosis of SARS-CoV-2 by FDA under an Emergency Use Authorization (EUA). This EUA will remain  in effect (meaning this test can be used) for the duration of  the COVID-19 declaration under Section 564(b)(1) of the Act, 21 U.S.C.section 360bbb-3(b)(1), unless the authorization is terminated  or revoked sooner.       Influenza A by PCR NEGATIVE NEGATIVE Final   Influenza B by PCR NEGATIVE NEGATIVE Final    Comment: (NOTE) The Xpert Xpress SARS-CoV-2/FLU/RSV plus assay is intended as an aid in the diagnosis of influenza from Nasopharyngeal swab specimens and should not be used as a sole basis for treatment. Nasal washings and aspirates are unacceptable for Xpert Xpress SARS-CoV-2/FLU/RSV testing.  Fact Sheet for Patients: bloggercourse.com  Fact Sheet for Healthcare Providers: seriousbroker.it  This test is not yet approved or cleared by the [place]  FDA and has been authorized for detection and/or diagnosis of SARS-CoV-2 by FDA under an Emergency Use Authorization (EUA). This EUA will remain in effect (meaning this test can be used) for the duration of the COVID-19 declaration under Section 564(b)(1) of the Act, 21 U.S.C. section 360bbb-3(b)(1), unless the authorization is terminated or revoked.     Resp Syncytial Virus by PCR NEGATIVE NEGATIVE Final    Comment: (NOTE) Fact Sheet for Patients: bloggercourse.com  Fact Sheet for Healthcare Providers: seriousbroker.it  This test is not yet approved or cleared by the [place]  FDA and has been authorized for detection and/or diagnosis of SARS-CoV-2 by FDA under an Emergency Use Authorization (EUA). This EUA will remain in effect (meaning this test can be used) for the duration of the COVID-19 declaration under Section 564(b)(1) of the Act, 21 U.S.C. section 360bbb-3(b)(1), unless the authorization is terminated or revoked.  Performed at [facility], [address] [place], [place] [address]     Labs: CBC: Recent Labs  Lab 08/14/24 1922 08/15/24 0438  WBC 9.2 6.1   NEUTROABS 7.4  --   HGB 10.3* 10.6*  HCT 32.9* 34.0*  MCV 94.0 93.7  PLT 268 270   Basic Metabolic Panel: Recent Labs  Lab 08/14/24 1922 08/15/24 0438 08/16/24 0442 08/17/24 0431  NA 137 137 140 140  K 5.2* 4.9 3.7 3.5  CL 99 97* 100 99  CO2 25 21* 29 29  GLUCOSE 238* 258* 125* 157*  BUN 17 21 28* 22  CREATININE 0.71 0.68 0.76 0.64  CALCIUM  9.4 9.5 9.0 9.1  MG  --  2.5*  --   --   PHOS  --  4.4  --   --    Liver Function Tests: Recent Labs  Lab 08/14/24 1922  AST 33  ALT 23  ALKPHOS 136*  BILITOT 0.8  PROT 7.1  ALBUMIN 4.2   CBG: Recent Labs  Lab 08/16/24 1133 08/16/24 1620 08/16/24 1947 08/17/24 0738 08/17/24 1112  GLUCAP 240* 262* 244* 170* 254*    Discharge time spent: 36 minutes.  Length of inpatient stay: 3 days  Signed: Carliss LELON Canales, DO [facility] 08/17/2024

## 2024-08-17 NOTE — TOC Transition Note (Signed)
 Transition of Care [facility]) - Discharge Note   Patient Details  Name: [name] [name] MRN: [id number] Date of Birth: 01-Aug-1941  [facility] CM/SW Contact:  Sharlyne Stabs, RN Phone Number: 08/17/2024, 10:13 AM   Clinical Narrative:   CM at the bedside to discuss PT eval. Patient said an agency called two days ago and will come Monday for first PT at home.  CM found out that [facility] accepted. MD placing new orders. Patient lives alone, has an aide three hours a day, four days a week. Her daughter assist as needed. Patient still drives occasionally with her aide to assist in and out to get to doctors appointments.    Final next level of care: Home w Home Health Services Barriers to Discharge: Barriers Resolved   Patient Goals and CMS Choice Patient states their goals for this hospitalization and ongoing recovery are:: Home with [facility] CMS Medicare.gov Compare Post Acute Care list provided to:: Patient Choice offered to / list presented to : Patient [place] ownership interest in [facility].provided to:: Patient    Discharge Placement             Patient and family notified of of transfer: 08/17/24  Discharge Plan and Services Additional resources added to the After Visit Summary for        [facility] Arranged: PT [facility] Agency: [facility] Date HH Agency Contacted: 08/17/24 Time HH Agency Contacted: 1013 Representative spoke with at [facility] Agency: [facility]  Social Drivers of Health (SDOH) Interventions SDOH Screenings   Food Insecurity: No Food Insecurity (08/14/2024)  Housing: Low Risk  (08/14/2024)  Transportation Needs: No Transportation Needs (08/14/2024)  Utilities: Not At Risk (08/14/2024)  Depression (PHQ2-9): Low Risk  (06/21/2024)  Financial Resource Strain: Low Risk  (10/06/2023)   Received from [facility]  Social Connections: Moderately Isolated (08/14/2024)  Stress: No Stress Concern Present (08/03/2024)   Received from [facility]  Tobacco Use: Medium Risk (08/14/2024)     Readmission Risk Interventions    08/17/2024   10:13 AM  Readmission Risk Prevention Plan  Transportation Screening Complete  PCP or Specialist Appt within 5-7 Days Complete  Home Care Screening Complete  Medication Review (RN CM) Complete

## 2024-08-17 NOTE — Plan of Care (Signed)
 Problem: Education: Goal: Knowledge of General Education information will improve Description: Including pain rating scale, medication(s)/side effects and non-pharmacologic comfort measures 08/17/2024 1203 by Mathews Norleen POUR, RN Outcome: Adequate for Discharge 08/17/2024 0919 by Mathews Norleen POUR, RN Outcome: Progressing   Problem: Health Behavior/Discharge Planning: Goal: Ability to manage health-related needs will improve 08/17/2024 1203 by Mathews Norleen POUR, RN Outcome: Adequate for Discharge 08/17/2024 0919 by Mathews Norleen POUR, RN Outcome: Progressing   Problem: Clinical Measurements: Goal: Ability to maintain clinical measurements within normal limits will improve 08/17/2024 1203 by Mathews Norleen POUR, RN Outcome: Adequate for Discharge 08/17/2024 0919 by Mathews Norleen POUR, RN Outcome: Progressing Goal: Will remain free from infection 08/17/2024 1203 by Mathews Norleen POUR, RN Outcome: Adequate for Discharge 08/17/2024 0919 by Mathews Norleen POUR, RN Outcome: Progressing Goal: Diagnostic test results will improve 08/17/2024 1203 by Mathews Norleen POUR, RN Outcome: Adequate for Discharge 08/17/2024 0919 by Mathews Norleen POUR, RN Outcome: Progressing Goal: Respiratory complications will improve 08/17/2024 1203 by Mathews Norleen POUR, RN Outcome: Adequate for Discharge 08/17/2024 0919 by Mathews Norleen POUR, RN Outcome: Progressing Goal: Cardiovascular complication will be avoided 08/17/2024 1203 by Mathews Norleen POUR, RN Outcome: Adequate for Discharge 08/17/2024 0919 by Mathews Norleen POUR, RN Outcome: Progressing   Problem: Activity: Goal: Risk for activity intolerance will decrease 08/17/2024 1203 by Mathews Norleen POUR, RN Outcome: Adequate for Discharge 08/17/2024 0919 by Mathews Norleen POUR, RN Outcome: Progressing   Problem: Nutrition: Goal: Adequate nutrition will be maintained 08/17/2024 1203 by Mathews Norleen POUR, RN Outcome: Adequate for Discharge 08/17/2024 0919 by Mathews Norleen POUR, RN Outcome: Progressing   Problem:  Coping: Goal: Level of anxiety will decrease 08/17/2024 1203 by Mathews Norleen POUR, RN Outcome: Adequate for Discharge 08/17/2024 0919 by Mathews Norleen POUR, RN Outcome: Progressing   Problem: Elimination: Goal: Will not experience complications related to bowel motility 08/17/2024 1203 by Mathews Norleen POUR, RN Outcome: Adequate for Discharge 08/17/2024 0919 by Mathews Norleen POUR, RN Outcome: Progressing Goal: Will not experience complications related to urinary retention 08/17/2024 1203 by Mathews Norleen POUR, RN Outcome: Adequate for Discharge 08/17/2024 0919 by Mathews Norleen POUR, RN Outcome: Progressing   Problem: Pain Managment: Goal: General experience of comfort will improve and/or be controlled 08/17/2024 1203 by Mathews Norleen POUR, RN Outcome: Adequate for Discharge 08/17/2024 0919 by Mathews Norleen POUR, RN Outcome: Progressing   Problem: Safety: Goal: Ability to remain free from injury will improve 08/17/2024 1203 by Mathews Norleen POUR, RN Outcome: Adequate for Discharge 08/17/2024 0919 by Mathews Norleen POUR, RN Outcome: Progressing   Problem: Skin Integrity: Goal: Risk for impaired skin integrity will decrease 08/17/2024 1203 by Mathews Norleen POUR, RN Outcome: Adequate for Discharge 08/17/2024 0919 by Mathews Norleen POUR, RN Outcome: Progressing   Problem: Education: Goal: Ability to describe self-care measures that may prevent or decrease complications (Diabetes Survival Skills Education) will improve 08/17/2024 1203 by Mathews Norleen POUR, RN Outcome: Adequate for Discharge 08/17/2024 0919 by Mathews Norleen POUR, RN Outcome: Progressing Goal: Individualized Educational Video(s) 08/17/2024 1203 by Mathews Norleen POUR, RN Outcome: Adequate for Discharge 08/17/2024 0919 by Mathews Norleen POUR, RN Outcome: Progressing   Problem: Coping: Goal: Ability to adjust to condition or change in health will improve 08/17/2024 1203 by Mathews Norleen POUR, RN Outcome: Adequate for Discharge 08/17/2024 0919 by Mathews Norleen POUR, RN Outcome: Progressing    Problem: Fluid Volume: Goal: Ability to maintain a balanced intake and output will improve 08/17/2024 1203 by Mathews Norleen POUR, RN Outcome: Adequate for Discharge 08/17/2024  9080 by Mathews Norleen POUR, RN Outcome: Progressing   Problem: Health Behavior/Discharge Planning: Goal: Ability to identify and utilize available resources and services will improve 08/17/2024 1203 by Arabelle Bollig, Norleen POUR, RN Outcome: Adequate for Discharge 08/17/2024 0919 by Mathews Norleen POUR, RN Outcome: Progressing Goal: Ability to manage health-related needs will improve 08/17/2024 1203 by Mathews Norleen POUR, RN Outcome: Adequate for Discharge 08/17/2024 0919 by Mathews Norleen POUR, RN Outcome: Progressing   Problem: Metabolic: Goal: Ability to maintain appropriate glucose levels will improve 08/17/2024 1203 by Mathews Norleen POUR, RN Outcome: Adequate for Discharge 08/17/2024 0919 by Mathews Norleen POUR, RN Outcome: Progressing   Problem: Nutritional: Goal: Maintenance of adequate nutrition will improve 08/17/2024 1203 by Mathews Norleen POUR, RN Outcome: Adequate for Discharge 08/17/2024 0919 by Mathews Norleen POUR, RN Outcome: Progressing Goal: Progress toward achieving an optimal weight will improve 08/17/2024 1203 by Mathews Norleen POUR, RN Outcome: Adequate for Discharge 08/17/2024 0919 by Mathews Norleen POUR, RN Outcome: Progressing   Problem: Skin Integrity: Goal: Risk for impaired skin integrity will decrease 08/17/2024 1203 by Mathews Norleen POUR, RN Outcome: Adequate for Discharge 08/17/2024 0919 by Mathews Norleen POUR, RN Outcome: Progressing   Problem: Tissue Perfusion: Goal: Adequacy of tissue perfusion will improve 08/17/2024 1203 by Mathews Norleen POUR, RN Outcome: Adequate for Discharge 08/17/2024 0919 by Mathews Norleen POUR, RN Outcome: Progressing

## 2024-08-17 NOTE — Progress Notes (Addendum)
 Physical Therapy Treatment Patient Details Name: [name] [name] MRN: [id number] DOB: 1941/09/19 Today's Date: 08/17/2024   History of Present Illness [name] [name] is a 83 y.o. female with medical history significant for peripheral artery disease status post left femoral embolectomy, type 2 diabetes, hypertension, OSA, chronic HFpEF, coronary artery disease status post PCI with stent placement, history of CVA, history of cellulitis and abscess of left lower extremity, status post debridement 07/28/2024 and 08/01/2024 with LMA, discharged home with home health services from [facility] yesterday and wants to be treated here at [facility], who presents to the ER due to sudden onset shortness of breath today.     In the ER, CT angio chest was negative for pulmonary embolism.  It showed mild interstitial edema.  Moderate bilateral pleural effusions, partially loculated on the left.  Mild thoracic lymphadenopathy, chronic likely reactive.  proBNP 908.  Hypervolemic on exam.     Received IV diuretics in the ER.  EDP requesting admission for management of acute on chronic HFpEF.    PT Comments  Patient agreeable to PT session. Patient was received sitting in chair at start of session. Patient was abale to complete LE exercises in seated position with verbal and visual cueing. Patient given print off of HEP. Patient reporting discomfort in L hip/groin region only with marching exercise and tolerates the rest well. Patient able to complete 5 STS in session with supervision and without AD. Patient ambulates in hall slightly further past last ambulation's distance with mobility tech, reporting LE and overall fatigue. No unsteadiness or LOB to note. Patient returns to sitting in recliner at end of session, call needs met, and call button in reach. Patient will benefit from continued skilled physical therapy acutely and in recommended venue in order to address remaining deficits.     If plan is discharge home, recommend the  following: A little help with walking and/or transfers;Assist for transportation;Assistance with cooking/housework;A little help with bathing/dressing/bathroom;Help with stairs or ramp for entrance   Can travel by private vehicle        Equipment Recommendations  None recommended by PT    Recommendations for Other Services       Precautions / Restrictions Precautions Precautions: Fall Recall of Precautions/Restrictions: Intact Restrictions Weight Bearing Restrictions Per Provider Order: No     Mobility  Bed Mobility               General bed mobility comments: Pt received sitting in recliner upon entering room    Transfers Overall transfer level: Needs assistance Equipment used: None Transfers: Sit to/from Stand Sit to Stand: Contact guard assist, Supervision           General transfer comment: 5 STS in session for LE strengthening, pt opt to use BUE to push off from arm rests with each stand, no overt unsteadiness throughout    Ambulation/Gait Ambulation/Gait assistance: Contact guard assist, Supervision Gait Distance (Feet): 200 Feet Assistive device: Rolling walker (2 wheels) Gait Pattern/deviations: Step-to pattern, Decreased step length - right, Decreased step length - left, Decreased stance time - right, Decreased stance time - left, Decreased stride length, Wide base of support Gait velocity: Dec     General Gait Details: Pt ambulates in room and into halls with RW and CGA, no unsteadiness or LOB to note, limited due to fatigue taking one quick short standing rest   Stairs             Wheelchair Mobility     Tilt Bed  Modified Rankin (Stroke Patients Only)       Balance Overall balance assessment: Needs assistance Sitting-balance support: No upper extremity supported, Feet supported Sitting balance-Leahy Scale: Good Sitting balance - Comments: in chair   Standing balance support: During functional activity, Bilateral upper  extremity supported Standing balance-Leahy Scale: Good Standing balance comment: Good w/RW          Communication Communication Communication: No apparent difficulties  Cognition Arousal: Alert Behavior During Therapy: WFL for tasks assessed/performed   PT - Cognitive impairments: No apparent impairments       Following commands: Intact      Cueing Cueing Techniques: Verbal cues, Visual cues  Exercises General Exercises - Lower Extremity Long Arc Quad: AROM, Strengthening, Both, 10 reps, Seated Hip ABduction/ADduction: AROM, Strengthening, Both, 10 reps, Seated Hip Flexion/Marching: AROM, Strengthening, Both, 10 reps, Seated Toe Raises: AROM, Strengthening, Both, 10 reps, Seated Heel Raises: AROM, Strengthening, Both, 10 reps, Seated    General Comments        Pertinent Vitals/Pain Pain Assessment Pain Assessment: No/denies pain (Denies pain intially but reports pain in L hip/groin region with LE exercises)    Home Living                          Prior Function            PT Goals (current goals can now be found in the care plan section) Acute Rehab PT Goals Patient Stated Goal: pt. want to return home PT Goal Formulation: With patient Time For Goal Achievement: 08/25/24 Potential to Achieve Goals: Good Progress towards PT goals: Progressing toward goals    Frequency    Min 3X/week      PT Plan      Co-evaluation              AM-PAC PT 6 Clicks Mobility   Outcome Measure  Help needed turning from your back to your side while in a flat bed without using bedrails?: None Help needed moving from lying on your back to sitting on the side of a flat bed without using bedrails?: A Little Help needed moving to and from a bed to a chair (including a wheelchair)?: A Little Help needed standing up from a chair using your arms (e.g., wheelchair or bedside chair)?: None Help needed to walk in hospital room?: A Little Help needed climbing 3-5  steps with a railing? : A Lot 6 Click Score: 19    End of Session   Activity Tolerance: Patient tolerated treatment well;Patient limited by fatigue Patient left: in chair;with call bell/phone within reach   PT Visit Diagnosis: Unsteadiness on feet (R26.81);Repeated falls (R29.6);Muscle weakness (generalized) (M62.81)     Time: 1035-1050 PT Time Calculation (min) (ACUTE ONLY): 15 min  Charges:    $Therapeutic Exercise: 8-22 mins PT General Charges $$ ACUTE PT VISIT: 1 Visit                     12:15 PM, 08/17/24 Keyry Iracheta Powell-Butler, PT, DPT [place] with [facility]

## 2024-08-17 NOTE — Care Management Important Message (Signed)
 Important Message  Patient Details  Name: [name] [name] MRN: [id number] Date of Birth: 1941-05-14   Important Message Given:  Yes - Medicare IM     Esthela Brandner L Journie Howson 08/17/2024, 11:32 AM

## 2024-08-17 NOTE — Progress Notes (Signed)
 Attempted Reds clip x 2. Both times received low quality readings.

## 2024-08-17 NOTE — Plan of Care (Signed)

## 2024-08-29 ENCOUNTER — Other Ambulatory Visit (HOSPITAL_BASED_OUTPATIENT_CLINIC_OR_DEPARTMENT_OTHER): Payer: Self-pay

## 2024-09-17 ENCOUNTER — Other Ambulatory Visit: Payer: Self-pay | Admitting: Cardiology

## 2024-09-18 ENCOUNTER — Inpatient Hospital Stay (HOSPITAL_COMMUNITY)
Admission: EM | Admit: 2024-09-18 | Discharge: 2024-09-20 | DRG: 291 | Disposition: A | Discharge status: Home Health | Attending: Family Medicine | Admitting: Family Medicine

## 2024-09-18 ENCOUNTER — Other Ambulatory Visit: Payer: Self-pay

## 2024-09-18 ENCOUNTER — Emergency Department (HOSPITAL_COMMUNITY)

## 2024-09-18 ENCOUNTER — Telehealth: Payer: Self-pay | Admitting: Internal Medicine

## 2024-09-18 DIAGNOSIS — E1165 Type 2 diabetes mellitus with hyperglycemia: Secondary | ICD-10-CM | POA: Diagnosis present

## 2024-09-18 DIAGNOSIS — D649 Anemia, unspecified: Secondary | ICD-10-CM | POA: Diagnosis present

## 2024-09-18 DIAGNOSIS — Z87891 Personal history of nicotine dependence: Secondary | ICD-10-CM | POA: Diagnosis not present

## 2024-09-18 DIAGNOSIS — J449 Chronic obstructive pulmonary disease, unspecified: Secondary | ICD-10-CM | POA: Diagnosis present

## 2024-09-18 DIAGNOSIS — I5020 Unspecified systolic (congestive) heart failure: Principal | ICD-10-CM

## 2024-09-18 DIAGNOSIS — Z833 Family history of diabetes mellitus: Secondary | ICD-10-CM | POA: Diagnosis not present

## 2024-09-18 DIAGNOSIS — Z9841 Cataract extraction status, right eye: Secondary | ICD-10-CM

## 2024-09-18 DIAGNOSIS — I5043 Acute on chronic combined systolic (congestive) and diastolic (congestive) heart failure: Secondary | ICD-10-CM | POA: Diagnosis present

## 2024-09-18 DIAGNOSIS — Z955 Presence of coronary angioplasty implant and graft: Secondary | ICD-10-CM

## 2024-09-18 DIAGNOSIS — Z8249 Family history of ischemic heart disease and other diseases of the circulatory system: Secondary | ICD-10-CM | POA: Diagnosis not present

## 2024-09-18 DIAGNOSIS — L03116 Cellulitis of left lower limb: Secondary | ICD-10-CM | POA: Diagnosis present

## 2024-09-18 DIAGNOSIS — G4733 Obstructive sleep apnea (adult) (pediatric): Secondary | ICD-10-CM | POA: Diagnosis present

## 2024-09-18 DIAGNOSIS — Z9851 Tubal ligation status: Secondary | ICD-10-CM

## 2024-09-18 DIAGNOSIS — E1151 Type 2 diabetes mellitus with diabetic peripheral angiopathy without gangrene: Secondary | ICD-10-CM | POA: Diagnosis present

## 2024-09-18 DIAGNOSIS — Z9842 Cataract extraction status, left eye: Secondary | ICD-10-CM | POA: Diagnosis not present

## 2024-09-18 DIAGNOSIS — I2510 Atherosclerotic heart disease of native coronary artery without angina pectoris: Secondary | ICD-10-CM | POA: Diagnosis present

## 2024-09-18 DIAGNOSIS — Z7982 Long term (current) use of aspirin: Secondary | ICD-10-CM

## 2024-09-18 DIAGNOSIS — Z7902 Long term (current) use of antithrombotics/antiplatelets: Secondary | ICD-10-CM

## 2024-09-18 DIAGNOSIS — I739 Peripheral vascular disease, unspecified: Secondary | ICD-10-CM | POA: Diagnosis not present

## 2024-09-18 DIAGNOSIS — D75839 Thrombocytosis, unspecified: Secondary | ICD-10-CM | POA: Diagnosis present

## 2024-09-18 DIAGNOSIS — E7800 Pure hypercholesterolemia, unspecified: Secondary | ICD-10-CM | POA: Diagnosis present

## 2024-09-18 DIAGNOSIS — J9601 Acute respiratory failure with hypoxia: Secondary | ICD-10-CM | POA: Diagnosis present

## 2024-09-18 DIAGNOSIS — Z794 Long term (current) use of insulin: Secondary | ICD-10-CM | POA: Diagnosis not present

## 2024-09-18 DIAGNOSIS — I110 Hypertensive heart disease with heart failure: Principal | ICD-10-CM | POA: Diagnosis present

## 2024-09-18 DIAGNOSIS — Z79899 Other long term (current) drug therapy: Secondary | ICD-10-CM

## 2024-09-18 DIAGNOSIS — I252 Old myocardial infarction: Secondary | ICD-10-CM

## 2024-09-18 DIAGNOSIS — Z981 Arthrodesis status: Secondary | ICD-10-CM

## 2024-09-18 DIAGNOSIS — Z885 Allergy status to narcotic agent status: Secondary | ICD-10-CM

## 2024-09-18 DIAGNOSIS — I509 Heart failure, unspecified: Secondary | ICD-10-CM

## 2024-09-18 DIAGNOSIS — Z888 Allergy status to other drugs, medicaments and biological substances status: Secondary | ICD-10-CM

## 2024-09-18 DIAGNOSIS — I10 Essential (primary) hypertension: Secondary | ICD-10-CM | POA: Diagnosis not present

## 2024-09-18 DIAGNOSIS — Z8673 Personal history of transient ischemic attack (TIA), and cerebral infarction without residual deficits: Secondary | ICD-10-CM | POA: Diagnosis not present

## 2024-09-18 DIAGNOSIS — I5031 Acute diastolic (congestive) heart failure: Secondary | ICD-10-CM | POA: Diagnosis not present

## 2024-09-18 DIAGNOSIS — Z7984 Long term (current) use of oral hypoglycemic drugs: Secondary | ICD-10-CM

## 2024-09-18 DIAGNOSIS — I5033 Acute on chronic diastolic (congestive) heart failure: Secondary | ICD-10-CM | POA: Diagnosis not present

## 2024-09-18 LAB — HEPATIC FUNCTION PANEL
ALT: 10 U/L (ref 0–44)
AST: 16 U/L (ref 15–41)
Albumin: 3.3 g/dL — ABNORMAL LOW (ref 3.5–5.0)
Alkaline Phosphatase: 122 U/L (ref 38–126)
Bilirubin, Direct: 0.2 mg/dL (ref 0.0–0.2)
Indirect Bilirubin: 0.2 mg/dL — ABNORMAL LOW (ref 0.3–0.9)
Total Bilirubin: 0.4 mg/dL (ref 0.0–1.2)
Total Protein: 6.7 g/dL (ref 6.5–8.1)

## 2024-09-18 LAB — CBC
HCT: 28.5 % — ABNORMAL LOW (ref 36.0–46.0)
Hemoglobin: 9 g/dL — ABNORMAL LOW (ref 12.0–15.0)
MCH: 27.3 pg (ref 26.0–34.0)
MCHC: 31.6 g/dL (ref 30.0–36.0)
MCV: 86.4 fL (ref 80.0–100.0)
Platelets: 609 K/uL — ABNORMAL HIGH (ref 150–400)
RBC: 3.3 MIL/uL — ABNORMAL LOW (ref 3.87–5.11)
RDW: 14.9 % (ref 11.5–15.5)
WBC: 14.8 K/uL — ABNORMAL HIGH (ref 4.0–10.5)
nRBC: 0 % (ref 0.0–0.2)

## 2024-09-18 LAB — BASIC METABOLIC PANEL WITH GFR
Anion gap: 15 (ref 5–15)
BUN: 14 mg/dL (ref 8–23)
CO2: 20 mmol/L — ABNORMAL LOW (ref 22–32)
Calcium: 8.5 mg/dL — ABNORMAL LOW (ref 8.9–10.3)
Chloride: 97 mmol/L — ABNORMAL LOW (ref 98–111)
Creatinine, Ser: 0.6 mg/dL (ref 0.44–1.00)
GFR, Estimated: >60 mL/min (ref >60)
Glucose, Bld: 345 mg/dL — ABNORMAL HIGH (ref 70–99)
Potassium: 3.8 mmol/L (ref 3.5–5.1)
Sodium: 132 mmol/L — ABNORMAL LOW (ref 135–145)

## 2024-09-18 LAB — TROPONIN T, HIGH SENSITIVITY: Troponin T High Sensitivity: 20 ng/L — ABNORMAL HIGH (ref 0–19)

## 2024-09-18 LAB — PRO BRAIN NATRIURETIC PEPTIDE: Pro Brain Natriuretic Peptide: 1871 pg/mL — ABNORMAL HIGH (ref <300.0)

## 2024-09-18 MED ORDER — FUROSEMIDE 10 MG/ML IJ SOLN
40.0000 mg | Freq: Once | INTRAMUSCULAR | Status: AC
  Administered 2024-09-18: 23:00:00 40 mg via INTRAVENOUS
  Filled 2024-09-18: qty 4

## 2024-09-18 MED ORDER — NITROGLYCERIN 0.4 MG SL SUBL
0.4000 mg | SUBLINGUAL_TABLET | Freq: Once | SUBLINGUAL | Status: AC
  Administered 2024-09-18: 22:00:00 0.4 mg via SUBLINGUAL

## 2024-09-18 NOTE — Telephone Encounter (Signed)
 I received two pages from this patient regarding chest pain and shortness of breath. I tried to call the patient back, however, her phone redirects to voicemail. I called the patient 4 times and left a voicemail instructing the patient to make sure her phone is accessible to receive calls and that if she is continuing to have symptoms, she should call 911 and have her symptoms assessed in the ER. Additionally, I tried to call her son Warren, who is her emergency contact, however, I was again sent to voicemail.

## 2024-09-18 NOTE — H&P (Signed)
 History and Physical    Patient: [name] [name] FMW:[id number] DOB: [birth year] DOA: 09/18/2024 DOS: the patient was seen and examined on 09/18/2024 PCP: [facility]  Patient coming from: {Point_of_Origin:26[address]}  Chief Complaint:  Chief Complaint  Patient presents with   Chest Pain   HPI: [name] [name] is a 83 y.o. female with medical history significant of ***  Review of Systems: {ROS_Text:26778} Past Medical History:  Diagnosis Date   Anxiety    Arthritis    neck (03/15/2018)   CAD (coronary artery disease)    Status post POBA distal LAD, status post Cardiolite Myoview 2008 negative for ischemia   COPD (chronic obstructive pulmonary disease) (HCC)    Depression    GERD (gastroesophageal reflux disease)    Hypercholesterolemia    Hypertension    Peripheral vascular disease     status post iliac stent July 2001    Type II diabetes mellitus (HCC)    Past Surgical History:  Procedure Laterality Date   ANTERIOR CERVICAL DECOMP/DISCECTOMY FUSION  2001   BACK SURGERY     BREAST SURGERY     biopsy left breat-benign   CATARACT EXTRACTION, BILATERAL Bilateral    CHOLECYSTECTOMY OPEN     CORONARY ANGIOPLASTY     POBA distal LAD,   CORONARY STENT INTERVENTION N/A 03/15/2018   Procedure: CORONARY STENT INTERVENTION;  Surgeon: Dann Candyce RAMAN, MD;  Location: [facility] INVASIVE CV LAB;  Service: Cardiovascular;  Laterality: N/A;   DILATION AND CURETTAGE OF UTERUS  1960s   EYE SURGERY Bilateral    lasers   LEFT HEART CATH AND CORONARY ANGIOGRAPHY N/A 03/15/2018   Procedure: LEFT HEART CATH AND CORONARY ANGIOGRAPHY;  Surgeon: Dann Candyce RAMAN, MD;  Location: [facility] INVASIVE CV LAB;  Service: Cardiovascular;  Laterality: N/A;   PERIPHERAL VASCULAR INTERVENTION Right 04/2000    iliac stent    stents legs  2025   TUBAL LIGATION     Social History:  reports that she quit smoking about 28 years ago. Her smoking use included cigarettes. She started smoking about 62  years ago. She has a 20 pack-year smoking history. She has never used smokeless tobacco. She reports that she does not drink alcohol and does not use drugs.  Allergies[1]  Family History  Problem Relation Age of Onset   Heart failure Father    Heart failure Mother    Diabetes Mother    COPD Brother     Prior to Admission medications  Medication Sig Start Date End Date Taking? Authorizing Provider  amLODipine  (NORVASC ) 10 MG tablet Take 1 tablet (10 mg total) by mouth daily. 08/18/24   Arlon Carliss ORN, DO  aspirin  EC 81 MG tablet Take 81 mg by mouth daily. 09/25/19   [provider]  clopidogrel  (PLAVIX ) 75 MG tablet take 1 tablet by mouth daily 06/15/24   Alvan Dorn FALCON, MD  ezetimibe  (ZETIA ) 10 MG tablet take 1 tablet (10 MILLIGRAM total) by mouth daily. 05/14/24   Alvan Dorn FALCON, MD  furosemide  (LASIX ) 20 MG tablet take 1 tablet by mouth daily 09/18/24   Alvan Dorn FALCON, MD  gabapentin  (NEURONTIN ) 100 MG capsule Take 100 mg by mouth daily.    [provider]  HUMALOG  KWIKPEN 200 UNIT/ML KwikPen Inject 16 Units into the skin with breakfast, with lunch, and with evening meal. 06/18/22   [provider]  hydrALAZINE  (APRESOLINE ) 25 MG tablet Take 1 tablet (25 mg total) by mouth 2 (two) times daily. 02/15/24   Alvan Dorn  F, MD  icosapent  Ethyl (VASCEPA ) 1 g capsule Take 1 capsule (1 g total) by mouth 2 (two) times daily. 04/13/24   Alvan Dorn FALCON, MD  JARDIANCE  25 MG TABS tablet Take 25 mg by mouth daily. 11/18/21   [provider]  labetalol  (NORMODYNE ) 200 MG tablet Take 1 tablet (200 mg total) by mouth 2 (two) times daily. 01/16/24   Alvan Dorn FALCON, MD  LANTUS  SOLOSTAR 100 UNIT/ML Solostar Pen Inject 30 Units into the skin daily. Inject 18 units in lhe morning and 50 units every evening 08/17/24   Arlon Honey W, DO  LUMIGAN 0.01 % SOLN Place 1 drop into both eyes at bedtime.    [provider]  nitroGLYCERIN  (NITROSTAT )  0.4 MG SL tablet Place 1 tablet (0.4 mg total) under the tongue every 5 (five) minutes as needed for chest pain. Patient taking differently: Place 0.4 mg under the tongue every 5 (five) minutes x 3 doses as needed for chest pain. 03/16/18   Hammond, Janine, NP  oxyCODONE -acetaminophen  (PERCOCET) 10-325 MG tablet Take 1 tablet by mouth 2 (two) times daily.    [provider]  timolol  (TIMOPTIC ) 0.5 % ophthalmic solution Place 1 drop into both eyes daily. 06/11/22   [provider]    Physical Exam: Vitals:   09/18/24 2145 09/18/24 2150 09/18/24 2250  BP:  (!) 194/57 (!) 182/59  Pulse: 74 71 68  Resp: (!) 24 (!) 24 (!) 23  Temp: 98.4 F (36.9 C)    TempSrc: Oral    SpO2: 93% 98% 97%   *** Data Reviewed: {Tip this will not be part of the note when signed- Document your independent interpretation of telemetry tracing, EKG, lab, Radiology test or any other diagnostic tests. Add any new diagnostic test ordered today. (Optional):26781} {Results:26384}  Assessment and Plan: No notes have been filed under this hospital service. Service: Hospitalist     Advance Care Planning:   Code Status: Prior ***  Consults: ***  Family Communication: ***  Severity of Illness: {Observation/Inpatient:21159}  Author: Posey Maier, DO 09/18/2024 11:03 PM  For on call review [email or web address].     [1]  Allergies Allergen Reactions   Alendronate Swelling and Other (See Comments)    Leg edema   Lisinopril  Anaphylaxis, Swelling and Other (See Comments)    Tongue swelling   Losartan Swelling and Other (See Comments)    Tongue swelling   Clonidine Derivatives Other (See Comments)    Dizziness    Codeine Nausea And Vomiting   Hydrocodone Nausea And Vomiting   Hydrocodone-Acetaminophen  Nausea And Vomiting and Nausea Only   Pioglitazone Swelling    Swelling of ankles   Ramelteon Nausea And Vomiting and Nausea Only   Rosuvastatin  Other (See Comments)    Muscle cramps    Valsartan Nausea And Vomiting and Other (See Comments)    Dizziness

## 2024-09-18 NOTE — ED Notes (Signed)
 Pt is hypertensive and has not had her nightly PO HTN meds. Hydrochlorothiazide, amlodipine , and labetalol . Will inform hospitalist. Pt is in no acute distress currently. She is alert and oriented x 4. She has had large episode of urinary incontinence post lasix . Peri care performed. B Urbano Milhouse RN

## 2024-09-18 NOTE — ED Triage Notes (Signed)
 Pt bib [facility] from home c/o chest pain and SOB that started this morning, had similar episode last month and was told it was CHF exacerbation. Denies any pain at this time.   93% RA, placed on 2L [place]  324 aspirin  and 1 nitroglycerin  given PTA.

## 2024-09-19 ENCOUNTER — Inpatient Hospital Stay (HOSPITAL_COMMUNITY)

## 2024-09-19 ENCOUNTER — Telehealth: Payer: Self-pay | Admitting: Cardiology

## 2024-09-19 ENCOUNTER — Other Ambulatory Visit (HOSPITAL_COMMUNITY): Payer: Self-pay | Admitting: *Deleted

## 2024-09-19 ENCOUNTER — Encounter (HOSPITAL_COMMUNITY): Payer: Self-pay | Admitting: Internal Medicine

## 2024-09-19 DIAGNOSIS — I5031 Acute diastolic (congestive) heart failure: Secondary | ICD-10-CM

## 2024-09-19 LAB — COMPREHENSIVE METABOLIC PANEL WITH GFR
ALT: 8 U/L (ref 0–44)
AST: 11 U/L — ABNORMAL LOW (ref 15–41)
Albumin: 3.3 g/dL — ABNORMAL LOW (ref 3.5–5.0)
Alkaline Phosphatase: 116 U/L (ref 38–126)
Anion gap: 13 (ref 5–15)
BUN: 11 mg/dL (ref 8–23)
CO2: 25 mmol/L (ref 22–32)
Calcium: 8.6 mg/dL — ABNORMAL LOW (ref 8.9–10.3)
Chloride: 99 mmol/L (ref 98–111)
Creatinine, Ser: 0.58 mg/dL (ref 0.44–1.00)
GFR, Estimated: >60 mL/min (ref >60)
Glucose, Bld: 169 mg/dL — ABNORMAL HIGH (ref 70–99)
Potassium: 3.7 mmol/L (ref 3.5–5.1)
Sodium: 136 mmol/L (ref 135–145)
Total Bilirubin: 0.4 mg/dL (ref 0.0–1.2)
Total Protein: 6.5 g/dL (ref 6.5–8.1)

## 2024-09-19 LAB — GLUCOSE, CAPILLARY
Glucose-Capillary: 126 mg/dL — ABNORMAL HIGH (ref 70–99)
Glucose-Capillary: 301 mg/dL — ABNORMAL HIGH (ref 70–99)
Glucose-Capillary: 364 mg/dL — ABNORMAL HIGH (ref 70–99)
Glucose-Capillary: 301 mg/dL — ABNORMAL HIGH (ref 70–99)

## 2024-09-19 LAB — ECHOCARDIOGRAM LIMITED
Calc EF: 66.6 %
Height: 65 in
S' Lateral: 2.2 cm
Single Plane A2C EF: 70.7 %
Single Plane A4C EF: 63.9 %
Weight: 2744.29 [oz_av]

## 2024-09-19 LAB — TROPONIN T, HIGH SENSITIVITY: Troponin T High Sensitivity: 24 ng/L — ABNORMAL HIGH (ref 0–19)

## 2024-09-19 LAB — CBC
HCT: 27 % — ABNORMAL LOW (ref 36.0–46.0)
Hemoglobin: 8.6 g/dL — ABNORMAL LOW (ref 12.0–15.0)
MCH: 27.5 pg (ref 26.0–34.0)
MCHC: 31.9 g/dL (ref 30.0–36.0)
MCV: 86.3 fL (ref 80.0–100.0)
Platelets: 588 K/uL — ABNORMAL HIGH (ref 150–400)
RBC: 3.13 MIL/uL — ABNORMAL LOW (ref 3.87–5.11)
RDW: 15.3 % (ref 11.5–15.5)
WBC: 11 K/uL — ABNORMAL HIGH (ref 4.0–10.5)
nRBC: 0 % (ref 0.0–0.2)

## 2024-09-19 LAB — MAGNESIUM: Magnesium: 1.9 mg/dL (ref 1.7–2.4)

## 2024-09-19 LAB — PHOSPHORUS: Phosphorus: 2.8 mg/dL (ref 2.5–4.6)

## 2024-09-19 MED ORDER — ONDANSETRON HCL 4 MG PO TABS: 4.0000 mg | ORAL_TABLET | Freq: Four times a day (QID) | ORAL | Status: DC | PRN

## 2024-09-19 MED ORDER — CEFAZOLIN SODIUM-DEXTROSE 2-4 GM/100ML-% IV SOLN
2.0000 g | Freq: Three times a day (TID) | INTRAVENOUS | Status: DC
  Administered 2024-09-19 – 2024-09-20 (×5): 2 g via INTRAVENOUS
  Filled 2024-09-19 (×4): qty 100

## 2024-09-19 MED ORDER — AMLODIPINE BESYLATE 5 MG PO TABS
10.0000 mg | ORAL_TABLET | Freq: Every day | ORAL | Status: DC
  Administered 2024-09-19 – 2024-09-20 (×2): 10 mg via ORAL
  Filled 2024-09-19 (×2): qty 2

## 2024-09-19 MED ORDER — INSULIN ASPART 100 UNIT/ML IJ SOLN
0.0000 [IU] | Freq: Three times a day (TID) | INTRAMUSCULAR | Status: DC
  Administered 2024-09-19: 13:00:00 11 [IU] via SUBCUTANEOUS
  Administered 2024-09-19: 17:00:00 15 [IU] via SUBCUTANEOUS
  Administered 2024-09-20: 09:00:00 5 [IU] via SUBCUTANEOUS
  Filled 2024-09-19 (×3): qty 1

## 2024-09-19 MED ORDER — HYDRALAZINE HCL 25 MG PO TABS
25.0000 mg | ORAL_TABLET | Freq: Two times a day (BID) | ORAL | Status: DC
  Administered 2024-09-19 – 2024-09-20 (×3): 25 mg via ORAL
  Filled 2024-09-19 (×3): qty 1

## 2024-09-19 MED ORDER — ONDANSETRON HCL 4 MG/2ML IJ SOLN: 4.0000 mg | Freq: Four times a day (QID) | INTRAMUSCULAR | Status: DC | PRN

## 2024-09-19 MED ORDER — GABAPENTIN 100 MG PO CAPS
100.0000 mg | ORAL_CAPSULE | Freq: Every day | ORAL | Status: DC
  Administered 2024-09-19 (×2): 100 mg via ORAL
  Filled 2024-09-19 (×2): qty 1

## 2024-09-19 MED ORDER — FUROSEMIDE 10 MG/ML IJ SOLN
40.0000 mg | Freq: Two times a day (BID) | INTRAMUSCULAR | Status: DC
  Administered 2024-09-19 – 2024-09-20 (×3): 40 mg via INTRAVENOUS
  Filled 2024-09-19 (×3): qty 4

## 2024-09-19 MED ORDER — HYDRALAZINE HCL 20 MG/ML IJ SOLN
10.0000 mg | Freq: Four times a day (QID) | INTRAMUSCULAR | Status: DC | PRN
  Administered 2024-09-19: 01:00:00 10 mg via INTRAVENOUS
  Filled 2024-09-19: qty 1

## 2024-09-19 MED ORDER — HYDRALAZINE HCL 25 MG PO TABS
25.0000 mg | ORAL_TABLET | ORAL | Status: AC
  Administered 2024-09-19: 02:00:00 25 mg via ORAL
  Filled 2024-09-19: qty 1

## 2024-09-19 MED ORDER — ENOXAPARIN SODIUM 40 MG/0.4ML IJ SOSY
40.0000 mg | PREFILLED_SYRINGE | INTRAMUSCULAR | Status: DC
  Administered 2024-09-19 – 2024-09-20 (×2): 40 mg via SUBCUTANEOUS
  Filled 2024-09-19 (×2): qty 0.4

## 2024-09-19 MED ADMIN — Ezetimibe Tab 10 MG: 10 mg | ORAL | @ 11:00:00 | NDC 00904710304

## 2024-09-19 MED ADMIN — Clopidogrel Bisulfate Tab 75 MG (Base Equiv): 75 mg | ORAL | @ 11:00:00 | NDC 65862035790

## 2024-09-19 MED ADMIN — Labetalol HCl Tab 200 MG: 200 mg | ORAL | @ 22:00:00 | NDC 60687045011

## 2024-09-19 MED ADMIN — Labetalol HCl Tab 200 MG: 200 mg | ORAL | @ 11:00:00 | NDC 60687045011

## 2024-09-19 MED ADMIN — Aspirin Tab Delayed Release 81 MG: 81 mg | ORAL | @ 11:00:00 | NDC 10135072962

## 2024-09-19 MED FILL — Aspirin Tab Delayed Release 81 MG: 81.0000 mg | ORAL | Qty: 1 | Status: AC

## 2024-09-19 MED FILL — Labetalol HCl Tab 200 MG: 200.0000 mg | ORAL | Qty: 1 | Status: AC

## 2024-09-19 MED FILL — Clopidogrel Bisulfate Tab 75 MG (Base Equiv): 75.0000 mg | ORAL | Qty: 1 | Status: AC

## 2024-09-19 MED FILL — Ezetimibe Tab 10 MG: 10.0000 mg | ORAL | Qty: 1 | Status: AC

## 2024-09-19 NOTE — Progress Notes (Signed)
*  PRELIMINARY RESULTS* Echocardiogram Limited echocardiogram has been performed.  Teresa Aida PARAS 09/19/2024, 4:06 PM

## 2024-09-19 NOTE — Telephone Encounter (Signed)
 Patient called to report to Dr. Alvan that she has been admitted to [facility] and is in Room# 213 on the second floor.

## 2024-09-19 NOTE — Progress Notes (Signed)
 Transition of Care Department [facility]) has reviewed patient and no other TOC needs have been identified at this time. We will continue to monitor patient advancement through interdisciplinary progression rounds. If new patient transition needs arise, please place a TOC consult.   09/19/24 0748  TOC Brief Assessment  Insurance and Status Reviewed  Patient has primary care physician Yes  Home environment has been reviewed Lives alone  Prior level of function: Independent  Prior/Current Home Services No current home services  Social Drivers of Health Review SDOH reviewed no interventions necessary  Readmission risk has been reviewed Yes  Transition of care needs no transition of care needs at this time

## 2024-09-19 NOTE — Progress Notes (Signed)
 PROGRESS NOTE  [name] [name], is a 83 y.o. female, DOB - [birth year], FMW:[id number]  Admit date - 09/18/2024   Admitting Physician Posey Maier, DO  Outpatient Primary MD for the patient is Medicine, [facility] Internal  LOS - 1  Chief Complaint  Patient presents with   Chest Pain      Brief Narrative:  83 y.o. female with medical history significant of hypertension, T2DM, chronic HFpEF, and CAD s/p MI PCI with stent placement, history of CVA, PAD s/p left femoral embolectomy admitted on 09/19/2019 for with acute on chronic diastolic dysfunction CHF exacerbation and Lt LE cellulitis    -Assessment and Plan: 1) acute on chronic diastolic dysfunction CHF exacerbation-- -chest imaging studies on admission with pulmonary edema - Echo from 09/19/2024 with EF of 65 to 70%,, mild LVH noted--largely unchanged from prior echo from 11/12\/2025 which previously demonstrated grade 3 diastolic dysfunction. - IV Lasix , fluid input and output monitoring and daily weights - 2)Lt LE cellulitis--continue Ancef  - WBC trending down  3) chronic anemia--Hgb above 8 - No bleeding concerns at this time  4)DM2-A1c 7.4 reflecting uncontrolled DM with hyperglycemia PTA Use Novolog /Humalog  Sliding scale insulin  with Accu-Cheks/Fingersticks as ordered   5)PAD s/p left femoral embolectomy-Recently done at [facility] Continue clopidogrel  and Zetia  -Patient is apparently intolerant to statins   6)Coronary artery disease =-No chest pain, no ACS symptoms Continue clopidogrel  and Zetia   7)HTN--okay to continue amlodipine  10 mg daily along with labetalol  200 mg twice daily and hydralazine  25 mg twice daily  8) acute hypoxic respiratory failure--- due to 1 above - Currently requiring 2 L of oxygen  via nasal cannula  Status is: Inpatient   Disposition: The patient is from: Home              Anticipated d/c is to: Home              Anticipated d/c date is: 2 days              Patient currently is not medically  stable to d/c. Barriers: Not Clinically Stable-   Code Status :  -  Code Status: Full Code   Family Communication:    NA (patient is alert, awake and coherent)   DVT Prophylaxis  :   - SCDs   Place and maintain sequential compression device Start: 09/19/24 1627 Place TED hose Start: 09/19/24 1627 enoxaparin  (LOVENOX ) injection 40 mg Start: 09/19/24 1000 SCDs Start: 09/19/24 0348   Lab Results  Component Value Date   PLT 588 (H) 09/19/2024    Inpatient Medications  Scheduled Meds:  amLODipine   10 mg Oral Daily   aspirin  EC  81 mg Oral Daily   clopidogrel   75 mg Oral Daily   enoxaparin  (LOVENOX ) injection  40 mg Subcutaneous Q24H   ezetimibe   10 mg Oral Daily   furosemide   40 mg Intravenous Q12H   gabapentin   100 mg Oral QHS   hydrALAZINE   25 mg Oral BID   insulin  aspart  0-15 Units Subcutaneous TID WC   labetalol   200 mg Oral BID   Continuous Infusions:   ceFAZolin  (ANCEF ) IV 2 g (09/19/24 1331)   PRN Meds:.hydrALAZINE , ondansetron  **OR** ondansetron  (ZOFRAN ) IV   Anti-infectives (From admission, onward)    Start     Dose/Rate Route Frequency Ordered Stop   09/19/24 0600  ceFAZolin  (ANCEF ) IVPB 2g/100 mL premix        2 g 200 mL/hr over 30 Minutes Intravenous Every 8 hours 09/19/24 0446 09/26/24 0559  Subjective: [name] [name] today has no fevers, no emesis,  No chest pain,   - Left leg discomfort improving -Dyspnea improving - Voiding well -- Continues to require oxygen    Objective: Vitals:   09/19/24 0510 09/19/24 0721 09/19/24 1107 09/19/24 1319  BP: (!) 169/45   (!) 161/49  Pulse: 65   63  Resp:   18 18  Temp: 98.5 F (36.9 C)   98.4 F (36.9 C)  TempSrc: Oral   Oral  SpO2: 97%   97%  Weight:  77.8 kg    Height:        Intake/Output Summary (Last 24 hours) at 09/19/2024 1952 Last data filed at 09/19/2024 1500 Gross per 24 hour  Intake --  Output 700 ml  Net -700 ml   Filed Weights   09/19/24 0115 09/19/24 0721  Weight: 77.8 kg  77.8 kg    Physical Exam  Gen:- Awake Alert, in no acute distress HEENT:- [place].AT, No sclera icterus Nose--[place] 2L/min Neck-Supple Neck,No JVD,.  Lungs-diminished breath sounds with faint bibasilar rales  CV- S1, S2 normal, regular  Abd-  +ve B.Sounds, Abd Soft, No tenderness,    Extremity/Skin:- pedal pulses present, left lower extremity ecchymosis/swelling from recent vascular procedure Psych-affect is appropriate, oriented x3 Neuro-no new focal deficits, no tremors  Data Reviewed: I have personally reviewed following labs and imaging studies  CBC: Recent Labs  Lab 09/18/24 2150 09/19/24 0459  WBC 14.8* 11.0*  HGB 9.0* 8.6*  HCT 28.5* 27.0*  MCV 86.4 86.3  PLT 609* 588*   Basic Metabolic Panel: Recent Labs  Lab 09/18/24 2150 09/19/24 0459  NA 132* 136  K 3.8 3.7  CL 97* 99  CO2 20* 25  GLUCOSE 345* 169*  BUN 14 11  CREATININE 0.60 0.58  CALCIUM  8.5* 8.6*  MG  --  1.9  PHOS  --  2.8   GFR: Estimated Creatinine Clearance: 55.9 mL/min (by C-G formula based on SCr of 0.58 mg/dL). Liver Function Tests: Recent Labs  Lab 09/18/24 2150 09/19/24 0459  AST 16 11*  ALT 10 8  ALKPHOS 122 116  BILITOT 0.4 0.4  PROT 6.7 6.5  ALBUMIN 3.3* 3.3*   BNP (last 3 results) Recent Labs    08/16/24 0442 08/17/24 0431 09/18/24 2150  PROBNP 2,224.0* 1,362.0* 1,871.0*   Radiology Studies: ECHOCARDIOGRAM LIMITED Result Date: 09/19/2024    ECHOCARDIOGRAM LIMITED REPORT   Patient Name:   [name] [name] Date of Exam: 09/19/2024 Medical Rec #:  [id number]       Height:       65.0 in Accession #:    [id number]      Weight:       171.5 lb Date of Birth:  1941-06-19       BSA:          1.853 m Patient Age:    83 years        BP:           169/45 mmHg Patient Gender: F               HR:           65 bpm. Exam Location:  [facility] Procedure: Limited Echo (Both Spectral and Color Flow Doppler were utilized            during procedure). Indications:    CHF-Acute Diastolic I50.31   History:        Patient has prior history of Echocardiogram examinations, most  recent 08/15/2024. CHF, CAD, COPD and Stroke; Risk                 Factors:Hypertension, Diabetes and Dyslipidemia.  Sonographer:    Aida Pizza RCS Referring Phys: [id number] OLADAPO ADEFESO IMPRESSIONS  1. Limited study.  2. Left ventricular ejection fraction, by estimation, is 65 to 70%. The left ventricle has normal function. The left ventricle has no regional wall motion abnormalities. There is mild concentric left ventricular hypertrophy.  3. Right ventricular systolic function is normal. The right ventricular size is normal.  4. The mitral valve is degenerative.  5. The aortic valve is tricuspid. There is mild calcification of the aortic valve.  6. The inferior vena cava is normal in size with greater than 50% respiratory variability, suggesting right atrial pressure of 3 mmHg. FINDINGS  Left Ventricle: Left ventricular ejection fraction, by estimation, is 65 to 70%. The left ventricle has normal function. The left ventricle has no regional wall motion abnormalities. The left ventricular internal cavity size was normal in size. There is  mild concentric left ventricular hypertrophy. Right Ventricle: The right ventricular size is normal. No increase in right ventricular wall thickness. Right ventricular systolic function is normal. Pericardium: Trivial pericardial effusion is present. The pericardial effusion is posterior to the left ventricle. Presence of epicardial fat layer. Mitral Valve: The mitral valve is degenerative in appearance. Aortic Valve: The aortic valve is tricuspid. There is mild calcification of the aortic valve. There is mild aortic valve annular calcification. Aorta: The aortic root is normal in size and structure. Venous: The inferior vena cava is normal in size with greater than 50% respiratory variability, suggesting right atrial pressure of 3 mmHg. LEFT VENTRICLE PLAX 2D LVIDd:         4.00 cm  LVIDs:         2.20 cm LV PW:         1.20 cm LV IVS:        1.20 cm  LV Volumes (MOD) LV vol d, MOD A2C: 97.9 ml LV vol d, MOD A4C: 94.8 ml LV vol s, MOD A2C: 28.7 ml LV vol s, MOD A4C: 34.2 ml LV SV MOD A2C:     69.2 ml LV SV MOD A4C:     94.8 ml LV SV MOD BP:      65.7 ml LEFT ATRIUM         Index LA diam:    3.80 cm 2.05 cm/m   AORTA Ao Root diam: 3.10 cm Jayson Sierras MD Electronically signed by Jayson Sierras MD Signature Date/Time: 09/19/2024/4:31:42 PM    Final    DG Chest Port 1 View Result Date: 09/18/2024 EXAM: 1 VIEW(S) XRAY OF THE CHEST 09/18/2024 10:06:00 PM COMPARISON: 08/14/2024 cxr, ct chest 08/14/24 CLINICAL HISTORY: SOB. FINDINGS: LUNGS AND PLEURA: Diffuse interstitial opacities. Small left and trace right pleural effusions, decreased from prior exam. No pneumothorax. HEART AND MEDIASTINUM: Unchanged cardiomediastinal silhouette. Aortic arch calcifications. BONES AND SOFT TISSUES: Cervical spine surgical hardware noted. Old healed bilateral rib fractures. IMPRESSION: 1. Diffuse interstitial opacities. 2. Slight interval decrease of small left and trace right pleural effusions. Electronically signed by: Morgane Naveau MD 09/18/2024 10:21 PM EST RP Workstation: [id number]   Scheduled Meds:  amLODipine   10 mg Oral Daily   aspirin  EC  81 mg Oral Daily   clopidogrel   75 mg Oral Daily   enoxaparin  (LOVENOX ) injection  40 mg Subcutaneous Q24H   ezetimibe   10 mg Oral Daily   furosemide   40 mg Intravenous Q12H   gabapentin   100 mg Oral QHS   hydrALAZINE   25 mg Oral BID   insulin  aspart  0-15 Units Subcutaneous TID WC   labetalol   200 mg Oral BID   Continuous Infusions:   ceFAZolin  (ANCEF ) IV 2 g (09/19/24 1331)     LOS: 1 day    Rendall Carwin M.D on 09/19/2024 at 7:52 PM  Go to [email or web address] - for contact info  [facility] - Office  [phone]  If 7PM-7AM, please contact night-coverage [email or web address] 09/19/2024, 7:52 PM

## 2024-09-19 NOTE — Progress Notes (Signed)
 Patient has refused CPAP.  Patient is aware that we have them here if she changes her mind, but patient stated that she does not use that at home.

## 2024-09-19 NOTE — ED Provider Notes (Signed)
 [facility] Provider Note   CSN: [id number] Arrival date & time: 09/18/24  2137     Patient presents with: Chest Pain   [name] [name] is a 83 y.o. female.   Patient complains of shortness of breath.  She has COPD congestive heart failure peripheral vascular disease and coronary artery disease.  Patient states the symptoms were similar when she was having congestive heart failure problems approximately a month or so ago.  Patient had shortness of breath and chest discomfort that has been relieved by 1 nitro  The history is provided by the patient. No language interpreter was used.  Chest Pain Pain location:  L chest Pain quality: aching   Pain radiates to:  Does not radiate Pain severity:  Mild Onset quality:  Sudden Timing:  Constant Progression:  Waxing and waning Chronicity:  Recurrent Context: not breathing   Relieved by:  Nothing Worsened by:  Nothing Associated symptoms: shortness of breath   Associated symptoms: no abdominal pain, no back pain, no cough, no fatigue and no headache        Prior to Admission medications  Medication Sig Start Date End Date Taking? Authorizing Provider  amLODipine  (NORVASC ) 5 MG tablet Take 5 mg by mouth daily. 08/22/24  Yes [provider]  aspirin  EC 81 MG tablet Take 81 mg by mouth daily. 09/25/19  Yes [provider]  cephALEXin  (KEFLEX ) 500 MG capsule Take 500 mg by mouth 4 (four) times daily. For 7 days 09/18/24 09/25/24 Yes [provider]  cloNIDine (CATAPRES) 0.1 MG tablet Take 0.1 mg by mouth 2 (two) times daily. 08/16/24  Yes [provider]  clopidogrel  (PLAVIX ) 75 MG tablet take 1 tablet by mouth daily 06/15/24  Yes Branch, Dorn FALCON, MD  ezetimibe  (ZETIA ) 10 MG tablet take 1 tablet (10 MILLIGRAM total) by mouth daily. 05/14/24  Yes BranchDorn FALCON, MD  furosemide  (LASIX ) 20 MG tablet take 1 tablet by mouth daily 09/18/24  Yes Branch, Dorn FALCON, MD  gabapentin   (NEURONTIN ) 100 MG capsule Take 100 mg by mouth daily.   Yes [provider]  HUMALOG  KWIKPEN 200 UNIT/ML KwikPen Inject 16 Units into the skin with breakfast, with lunch, and with evening meal. 06/18/22  Yes [provider]  hydrALAZINE  (APRESOLINE ) 100 MG tablet Take 100 mg by mouth 3 (three) times daily. 08/16/24  Yes [provider]  icosapent  Ethyl (VASCEPA ) 1 g capsule Take 1 capsule (1 g total) by mouth 2 (two) times daily. 04/13/24  Yes BranchDorn FALCON, MD  JARDIANCE  25 MG TABS tablet Take 25 mg by mouth daily. 11/18/21  Yes [provider]  labetalol  (NORMODYNE ) 200 MG tablet Take 1 tablet (200 mg total) by mouth 2 (two) times daily. 01/16/24  Yes BranchDorn FALCON, MD  LANTUS  SOLOSTAR 100 UNIT/ML Solostar Pen Inject 30 Units into the skin daily. Inject 18 units in lhe morning and 50 units every evening 08/17/24  Yes Arlon Carliss ORN, DO  NIFEdipine (ADALAT CC) 90 MG 24 hr tablet Take 90 mg by mouth daily. 08/16/24  Yes [provider]  nitroGLYCERIN  (NITROSTAT ) 0.4 MG SL tablet Place 1 tablet (0.4 mg total) under the tongue every 5 (five) minutes as needed for chest pain. Patient taking differently: Place 0.4 mg under the tongue every 5 (five) minutes x 3 doses as needed for chest pain. 03/16/18  Yes Hammond, Janine, NP  oxyCODONE -acetaminophen  (PERCOCET) 10-325 MG tablet Take 1 tablet by mouth 2 (two) times daily.  Yes [provider]  amLODipine  (NORVASC ) 10 MG tablet Take 1 tablet (10 mg total) by mouth daily. Patient not taking: Reported on 09/19/2024 08/18/24   Arlon Carliss ORN, DO  amoxicillin-clavulanate (AUGMENTIN) 875-125 MG tablet Take 1 tablet by mouth 2 (two) times daily. Patient not taking: Reported on 09/19/2024 09/13/24   [provider]  hydrALAZINE  (APRESOLINE ) 25 MG tablet Take 1 tablet (25 mg total) by mouth 2 (two) times daily. Patient not taking: Reported on 09/19/2024 02/15/24   Alvan Dorn FALCON, MD     Allergies: Alendronate, Lisinopril , Losartan, Clonidine derivatives, Codeine, Hydrocodone, Hydrocodone-acetaminophen , Pioglitazone, Ramelteon, Rosuvastatin , and Valsartan    Review of Systems  Constitutional:  Negative for appetite change and fatigue.  HENT:  Negative for congestion, ear discharge and sinus pressure.   Eyes:  Negative for discharge.  Respiratory:  Positive for shortness of breath. Negative for cough.   Cardiovascular:  Positive for chest pain.  Gastrointestinal:  Negative for abdominal pain and diarrhea.  Genitourinary:  Negative for frequency and hematuria.  Musculoskeletal:  Negative for back pain.  Skin:  Negative for rash.  Neurological:  Negative for seizures and headaches.  Psychiatric/Behavioral:  Negative for hallucinations.     Updated Vital Signs BP (!) 169/45 (BP Location: Left Arm)   Pulse 65   Temp 98.5 F (36.9 C) (Oral)   Resp 18   Ht 5' 5 (1.651 m)   Wt 77.8 kg   SpO2 97%   BMI 28.54 kg/m   Physical Exam Vitals and nursing note reviewed.  Constitutional:      Appearance: She is well-developed.  HENT:     Head: Normocephalic.     Nose: Nose normal.  Eyes:     General: No scleral icterus.    Conjunctiva/sclera: Conjunctivae normal.  Neck:     Thyroid : No thyromegaly.  Cardiovascular:     Rate and Rhythm: Normal rate and regular rhythm.     Heart sounds: No murmur heard.    No friction rub. No gallop.  Pulmonary:     Breath sounds: No stridor. Rales present. No wheezing.  Chest:     Chest wall: No tenderness.  Abdominal:     General: There is no distension.     Tenderness: There is no abdominal tenderness. There is no rebound.  Musculoskeletal:        General: Normal range of motion.     Cervical back: Neck supple.  Lymphadenopathy:     Cervical: No cervical adenopathy.  Skin:    Findings: No erythema or rash.  Neurological:     Mental Status: She is alert and oriented to person, place, and time.     Motor: No abnormal  muscle tone.     Coordination: Coordination normal.  Psychiatric:        Behavior: Behavior normal.     (all labs ordered are listed, but only abnormal results are displayed) Labs Reviewed  BASIC METABOLIC PANEL WITH GFR - Abnormal; Notable for the following components:      Result Value   Sodium 132 (*)    Chloride 97 (*)    CO2 20 (*)    Glucose, Bld 345 (*)    Calcium  8.5 (*)    All other components within normal limits  CBC - Abnormal; Notable for the following components:   WBC 14.8 (*)    RBC 3.30 (*)    Hemoglobin 9.0 (*)    HCT 28.5 (*)    Platelets 609 (*)  All other components within normal limits  PRO BRAIN NATRIURETIC PEPTIDE - Abnormal; Notable for the following components:   Pro Brain Natriuretic Peptide 1,871.0 (*)    All other components within normal limits  HEPATIC FUNCTION PANEL - Abnormal; Notable for the following components:   Albumin 3.3 (*)    Indirect Bilirubin 0.2 (*)    All other components within normal limits  COMPREHENSIVE METABOLIC PANEL WITH GFR - Abnormal; Notable for the following components:   Glucose, Bld 169 (*)    Calcium  8.6 (*)    Albumin 3.3 (*)    AST 11 (*)    All other components within normal limits  CBC - Abnormal; Notable for the following components:   WBC 11.0 (*)    RBC 3.13 (*)    Hemoglobin 8.6 (*)    HCT 27.0 (*)    Platelets 588 (*)    All other components within normal limits  GLUCOSE, CAPILLARY - Abnormal; Notable for the following components:   Glucose-Capillary 126 (*)    All other components within normal limits  TROPONIN T, HIGH SENSITIVITY - Abnormal; Notable for the following components:   Troponin T High Sensitivity 20 (*)    All other components within normal limits  TROPONIN T, HIGH SENSITIVITY - Abnormal; Notable for the following components:   Troponin T High Sensitivity 24 (*)    All other components within normal limits  MAGNESIUM  PHOSPHORUS    EKG: EKG Interpretation Date/Time:  Tuesday  September 18 2024 21:49:51 EST Ventricular Rate:  72 PR Interval:  131 QRS Duration:  85 QT Interval:  404 QTC Calculation: 443 R Axis:   79  Text Interpretation: Sinus rhythm since last tracing no significant change Confirmed by Lenor Hollering [phone]) on 09/19/2024 10:44:05 AM  Radiology: [facility] Chest Port 1 View Result Date: 09/18/2024 EXAM: 1 VIEW(S) XRAY OF THE CHEST 09/18/2024 10:06:00 PM COMPARISON: 08/14/2024 cxr, ct chest 08/14/24 CLINICAL HISTORY: SOB. FINDINGS: LUNGS AND PLEURA: Diffuse interstitial opacities. Small left and trace right pleural effusions, decreased from prior exam. No pneumothorax. HEART AND MEDIASTINUM: Unchanged cardiomediastinal silhouette. Aortic arch calcifications. BONES AND SOFT TISSUES: Cervical spine surgical hardware noted. Old healed bilateral rib fractures. IMPRESSION: 1. Diffuse interstitial opacities. 2. Slight interval decrease of small left and trace right pleural effusions. Electronically signed by: Morgane Naveau MD 09/18/2024 10:21 PM EST RP Workstation: [id number]     Procedures   Medications Ordered in the ED  hydrALAZINE  (APRESOLINE ) injection 10 mg (10 mg Intravenous Given 09/19/24 0045)  aspirin  EC tablet 81 mg (81 mg Oral Given 09/19/24 1052)  ezetimibe  (ZETIA ) tablet 10 mg (10 mg Oral Given 09/19/24 1052)  hydrALAZINE  (APRESOLINE ) tablet 25 mg (25 mg Oral Given 09/19/24 1053)  clopidogrel  (PLAVIX ) tablet 75 mg (75 mg Oral Given 09/19/24 1053)  gabapentin  (NEURONTIN ) capsule 100 mg (100 mg Oral Given 09/19/24 0132)  enoxaparin  (LOVENOX ) injection 40 mg (40 mg Subcutaneous Given 09/19/24 1053)  ondansetron  (ZOFRAN ) tablet 4 mg (has no administration in time range)    Or  ondansetron  (ZOFRAN ) injection 4 mg (has no administration in time range)  furosemide  (LASIX ) injection 40 mg (40 mg Intravenous Given 09/19/24 1053)  insulin  aspart (novoLOG ) injection 0-15 Units ( Subcutaneous Patient Refused/Not Given 09/19/24 1026)  ceFAZolin  (ANCEF )  IVPB 2g/100 mL premix (2 g Intravenous New Bag/Given 09/19/24 0642)  amLODipine  (NORVASC ) tablet 10 mg (10 mg Oral Given 09/19/24 1053)  labetalol  (NORMODYNE ) tablet 200 mg (200 mg Oral Given 09/19/24 1053)  nitroGLYCERIN  (NITROSTAT ) SL  tablet 0.4 mg (0.4 mg Sublingual Given 09/18/24 2213)  furosemide  (LASIX ) injection 40 mg (40 mg Intravenous Given 09/18/24 2246)  hydrALAZINE  (APRESOLINE ) tablet 25 mg (25 mg Oral Given 09/19/24 0132)   CRITICAL CARE Performed by: Fairy Sermon Total critical care time: 40 minutes Critical care time was exclusive of separately billable procedures and treating other patients. Critical care was necessary to treat or prevent imminent or life-threatening deterioration. Critical care was time spent personally by me on the following activities: development of treatment plan with patient and/or surrogate as well as nursing, discussions with consultants, evaluation of patient's response to treatment, examination of patient, obtaining history from patient or surrogate, ordering and performing treatments and interventions, ordering and review of laboratory studies, ordering and review of radiographic studies, pulse oximetry and re-evaluation of patient's condition.                                  Medical Decision Making Amount and/or Complexity of Data Reviewed Labs: ordered. Radiology: ordered.  Risk Prescription drug management. Decision regarding hospitalization.   Patient with COPD with congestive heart failure exacerbation.  She will be admitted to medicine and diuresed     Final diagnoses:  Systolic congestive heart failure, unspecified HF chronicity [facility])    ED Discharge Orders     None          Sermon Fairy, MD 09/19/24 1120

## 2024-09-19 NOTE — Plan of Care (Signed)
  Problem: Education: Goal: Knowledge of General Education information will improve Description: Including pain rating scale, medication(s)/side effects and non-pharmacologic comfort measures Outcome: Progressing   Problem: Health Behavior/Discharge Planning: Goal: Ability to manage health-related needs will improve Outcome: Progressing   Problem: Clinical Measurements: Goal: Ability to maintain clinical measurements within normal limits will improve Outcome: Progressing Goal: Will remain free from infection Outcome: Progressing Goal: Diagnostic test results will improve Outcome: Progressing Goal: Respiratory complications will improve Outcome: Progressing Goal: Cardiovascular complication will be avoided Outcome: Progressing   Problem: Activity: Goal: Risk for activity intolerance will decrease Outcome: Progressing   Problem: Nutrition: Goal: Adequate nutrition will be maintained Outcome: Progressing   Problem: Coping: Goal: Level of anxiety will decrease Outcome: Progressing   Problem: Elimination: Goal: Will not experience complications related to bowel motility Outcome: Progressing Goal: Will not experience complications related to urinary retention Outcome: Progressing   Problem: Pain Managment: Goal: General experience of comfort will improve and/or be controlled Outcome: Progressing   Problem: Safety: Goal: Ability to remain free from injury will improve Outcome: Progressing   Problem: Skin Integrity: Goal: Risk for impaired skin integrity will decrease Outcome: Progressing   Problem: Education: Goal: Ability to describe self-care measures that may prevent or decrease complications (Diabetes Survival Skills Education) will improve Outcome: Progressing Goal: Individualized Educational Video(s) Outcome: Progressing   Problem: Coping: Goal: Ability to adjust to condition or change in health will improve Outcome: Progressing   Problem: Fluid  Volume: Goal: Ability to maintain a balanced intake and output will improve Outcome: Progressing   Problem: Health Behavior/Discharge Planning: Goal: Ability to identify and utilize available resources and services will improve Outcome: Progressing Goal: Ability to manage health-related needs will improve Outcome: Progressing   Problem: Metabolic: Goal: Ability to maintain appropriate glucose levels will improve Outcome: Progressing   Problem: Nutritional: Goal: Maintenance of adequate nutrition will improve Outcome: Progressing Goal: Progress toward achieving an optimal weight will improve Outcome: Progressing   Problem: Skin Integrity: Goal: Risk for impaired skin integrity will decrease Outcome: Progressing   Problem: Tissue Perfusion: Goal: Adequacy of tissue perfusion will improve Outcome: Progressing   Problem: Clinical Measurements: Goal: Ability to avoid or minimize complications of infection will improve Outcome: Progressing   Problem: Skin Integrity: Goal: Skin integrity will improve Outcome: Progressing

## 2024-09-20 ENCOUNTER — Ambulatory Visit: Admitting: Cardiology

## 2024-09-20 LAB — GLUCOSE, RANDOM: Glucose, Bld: 507 mg/dL (ref 70–99)

## 2024-09-20 LAB — GLUCOSE, CAPILLARY
Glucose-Capillary: 218 mg/dL — ABNORMAL HIGH (ref 70–99)
Glucose-Capillary: 434 mg/dL — ABNORMAL HIGH (ref 70–99)
Glucose-Capillary: 501 mg/dL (ref 70–99)
Glucose-Capillary: 379 mg/dL — ABNORMAL HIGH (ref 70–99)

## 2024-09-20 MED ORDER — DOXYCYCLINE HYCLATE 100 MG PO TABS: 100.0000 mg | ORAL_TABLET | Freq: Two times a day (BID) | ORAL | 0 refills | Status: AC

## 2024-09-20 MED ORDER — FUROSEMIDE 20 MG PO TABS: 20.0000 mg | ORAL_TABLET | Freq: Two times a day (BID) | ORAL | 1 refills | Status: DC

## 2024-09-20 MED ORDER — INSULIN ASPART 100 UNIT/ML IJ SOLN
20.0000 [IU] | Freq: Once | INTRAMUSCULAR | Status: AC
  Administered 2024-09-20: 14:00:00 20 [IU] via SUBCUTANEOUS

## 2024-09-20 MED ADMIN — Clopidogrel Bisulfate Tab 75 MG (Base Equiv): 75 mg | ORAL | @ 09:00:00 | NDC 65862035790

## 2024-09-20 MED ADMIN — Ezetimibe Tab 10 MG: 10 mg | ORAL | @ 09:00:00 | NDC 00904710304

## 2024-09-20 MED ADMIN — Labetalol HCl Tab 200 MG: 200 mg | ORAL | @ 09:00:00 | NDC 60687045011

## 2024-09-20 MED ADMIN — Aspirin Tab Delayed Release 81 MG: 81 mg | ORAL | @ 09:00:00 | NDC 10135072962

## 2024-09-20 NOTE — TOC Progression Note (Signed)
 Transition of Care [facility]) - Progression Note    Patient Details  Name: [name] [name] MRN: [id number] Date of Birth: 09/10/1941  Transition of Care [facility]) CM/SW Contact  Hoy DELENA Bigness, LCSW Phone Number: 09/20/2024, 9:18 AM  Clinical Narrative:    Pt active with [facility] for HHPT. ROC order will need to be placed prior to discharge.      Barriers to Discharge: Continued Medical Work up               Expected Discharge Plan and Services                                               Social Drivers of Health (SDOH) Interventions SDOH Screenings   Food Insecurity: No Food Insecurity (09/19/2024)  Housing: Low Risk (09/19/2024)  Transportation Needs: No Transportation Needs (09/19/2024)  Utilities: Not At Risk (09/19/2024)  Depression (PHQ2-9): Low Risk (06/21/2024)  Financial Resource Strain: Low Risk (10/06/2023)   Received from [facility]  Social Connections: Socially Isolated (09/19/2024)  Stress: No Stress Concern Present (08/03/2024)   Received from [facility]  Tobacco Use: Medium Risk (09/19/2024)    Readmission Risk Interventions    08/17/2024   10:13 AM  Readmission Risk Prevention Plan  Transportation Screening Complete  PCP or Specialist Appt within 5-7 Days Complete  Home Care Screening Complete  Medication Review (RN CM) Complete

## 2024-09-20 NOTE — Progress Notes (Signed)
 Oxygen  saturation was 95% on room air. Patient ambulated 20 feet while on the unit. Oxygen  saturation dropped to 92-94% during ambulation

## 2024-09-20 NOTE — Inpatient Diabetes Management (Signed)
 Inpatient Diabetes Program Recommendations  AACE/ADA: New Consensus Statement on Inpatient Glycemic Control (2015)  Target Ranges:  Prepandial:   less than 140 mg/dL      Peak postprandial:   less than 180 mg/dL (1-2 hours)      Critically ill patients:  140 - 180 mg/dL   Lab Results  Component Value Date   GLUCAP 218 (H) 09/20/2024   HGBA1C 7.4 (H) 08/14/2024    Review of Glycemic Control  Latest Reference Range & Units 09/19/24 07:29 09/19/24 12:43 09/19/24 16:31 09/19/24 21:01 09/20/24 07:45  Glucose-Capillary 70 - 99 mg/dL 873 (H) 698 (H)  Novolog  11 units given 364 (H)  Novolog  15 units given 301 (H) 218 (H)   Diabetes history: DM 2 Outpatient Diabetes medications: Humalog  16 units tid, Jardiance  25 mg Daily, Lantus  18 units qam, 50 units qpm  Current orders for Inpatient glycemic control:  Novolog  0-15 units tid  A1c 7.4% on 11/11 Renal function WNL No Supplements ordered  Note: glucose trends increase after PO intake and pt also take meal coverage Humalog  at home.   Inpatient Diabetes Program Recommendations:    -   Consider add Novolog  HS scale -   Consider adding Novolog  5 units tid meal coverage if eating >50 % of meals  Thanks,  Clotilda Bull RN, MSN, BC-ADM Inpatient Diabetes Coordinator Team Pager [phone] (8a-5p)

## 2024-09-20 NOTE — Discharge Summary (Signed)
 "                                                                                   [name] [name], is a 83 y.o. female  DOB 1941/07/13  MRN [id number].  Admission date:  09/18/2024  Admitting Physician  Posey Maier, DO  Discharge Date:  09/20/2024   Primary MD  Medicine, [facility]  Recommendations for primary care physician for things to follow:  1)Very Low-salt diet advised---Less than 2 gm of Sodium per day advised----ok to use Mrs DASH salt substitute instead of Salt 2)Weigh yourself daily, call if you gain more than 3 pounds in 1 day or more than 5 pounds in 1 week as your diuretic medications may need to be adjusted 3) please repeat CBC blood test in about a week  Admission Diagnosis  Acute exacerbation of CHF (congestive heart failure) (HCC) [I50.9] Systolic congestive heart failure, unspecified HF chronicity (HCC) [I50.20]   Discharge Diagnosis  Acute exacerbation of CHF (congestive heart failure) (HCC) [I50.9] Systolic congestive heart failure, unspecified HF chronicity (HCC) [I50.20]    Principal Problem:   Acute exacerbation of CHF (congestive heart failure) (HCC) Active Problems:   Hypertension   Peripheral vascular disease   Diabetes mellitus (HCC)   CAD (coronary artery disease)   Status post coronary artery stent placement   Acute on chronic diastolic (congestive) heart failure (HCC)      Past Medical History:  Diagnosis Date   Anxiety    Arthritis    neck (03/15/2018)   CAD (coronary artery disease)    Status post POBA distal LAD, status post Cardiolite Myoview 2008 negative for ischemia   COPD (chronic obstructive pulmonary disease) (HCC)    Depression    GERD (gastroesophageal reflux disease)    Hypercholesterolemia    Hypertension    Peripheral vascular disease     status post iliac stent July 2001    Type II diabetes mellitus (HCC)     Past Surgical History:  Procedure Laterality Date   ANTERIOR CERVICAL DECOMP/DISCECTOMY FUSION  2001    BACK SURGERY     BREAST SURGERY     biopsy left breat-benign   CATARACT EXTRACTION, BILATERAL Bilateral    CHOLECYSTECTOMY OPEN     CORONARY ANGIOPLASTY     POBA distal LAD,   CORONARY STENT INTERVENTION N/A 03/15/2018   Procedure: CORONARY STENT INTERVENTION;  Surgeon: Dann Candyce RAMAN, MD;  Location: [facility] INVASIVE CV LAB;  Service: Cardiovascular;  Laterality: N/A;   DILATION AND CURETTAGE OF UTERUS  1960s   EYE SURGERY Bilateral    lasers   LEFT HEART CATH AND CORONARY ANGIOGRAPHY N/A 03/15/2018   Procedure: LEFT HEART CATH AND CORONARY ANGIOGRAPHY;  Surgeon: Dann Candyce RAMAN, MD;  Location: [facility] INVASIVE CV LAB;  Service: Cardiovascular;  Laterality: N/A;   PERIPHERAL VASCULAR INTERVENTION Right 04/2000    iliac stent    stents legs  2025   TUBAL LIGATION         HPI  from the history and physical done on the day of admission:    HPI: LOTOYA [name] is a 83 y.o. female with medical history significant of hypertension, T2DM,  chronic HFpEF, and CAD s/p MI PCI with stent placement, history of CVA, PAD s/p left femoral embolectomy who presents to the emergency department for evaluation of shortness of breath and chest pain which started this morning.  Shortness of breath worsens with ambulation.  She states that she had similar episode last month and was told she had CHF exacerbation.  She endorsed several days of onset of increased leg swelling.   ED course In the emergency department, she was tachypneic, BP was 194/57.  Workup in the ED showed leukocytosis, thrombocytosis and normocytic anemia.  Troponin 20 > 24, proBNP 1,871. Chest x-ray showed diffuse interstitial opacities with slight interval decrease of small left and trace right pleural effusions. Patient was treated with IV Lasix  40 mg x 1, nitroglycerin  sublingual x 1 was given.  TRH was asked to admit patient   Review of Systems: As mentioned in the history of present illness. All other systems reviewed and are  negative.     Hospital Course:   Brief Narrative:  83 y.o. female with medical history significant of hypertension, T2DM, chronic HFpEF, and CAD s/p MI PCI with stent placement, history of CVA, PAD s/p left femoral embolectomy admitted on 09/19/2019 for with acute on chronic diastolic dysfunction CHF exacerbation and Lt LE cellulitis     -Assessment and Plan: 1) acute on chronic diastolic dysfunction CHF exacerbation-- -chest imaging studies on admission with pulmonary edema - Echo from 09/19/2024 with EF of 65 to 70%,, mild LVH noted--largely unchanged from prior echo from 11/12\/2025 which previously demonstrated grade 3 diastolic dysfunction. - -Much improved with IV Lasix  - Okay to discharge on oral Lasix  and salt restriction  2)Lt LE cellulitis-- -much improved on IV Ancef  - WBC trending down 14.8 >> 11.0  -Afebrile -Okay to discharge on oral Keflex /Doxy combo   3) chronic anemia--Hgb above 8 - No bleeding concerns at this time  4)DM2-A1c 7.4 reflecting uncontrolled DM with hyperglycemia PTA -Stable, okay to continue PTA diabetic regimen including Jardiance  and insulin  therapy   5)PAD s/p left femoral embolectomy-Recently done at [facility] Continue, aspirin , clopidogrel  and Zetia  -Patient is apparently intolerant to statins   6)Coronary artery disease =-No chest pain, no ACS symptoms Continue aspirin ,clopidogrel  and Zetia    7)HTN--okay to continue clonidine, labetalol  200 mg twice daily and hydralazine  25 mg twice daily --Appears to be on both amlodipine  and nifedipine--- he needs to talk to PCP about stopping 1 and continue the other, he should Not be on both amlodipine  and nifedipine   8) acute hypoxic respiratory failure--- due to 1 above - Hypoxia resolved after diuresis as noted above #1 - O2 sats post ambulation is about 92 to 94% on room air --  9) generalized weakness and deconditioning--- PT eval appreciated - Recommend home health PT and home health DME  including 3N1 commode  Discharge Condition: stable, without further oxygen  requirement  Follow UP   Follow-up Information     Medicine, [facility]. Schedule an appointment as soon as possible for a visit in 1 week(s).   Specialty: Internal Medicine Contact information: [address] [place] [address] [phone]                 Diet and Activity recommendation:  As advised  Discharge Instructions    Discharge Instructions     Call MD for:  difficulty breathing, headache or visual disturbances   Complete by: As directed    Call MD for:  persistant dizziness or light-headedness   Complete  by: As directed    Call MD for:  persistant nausea and vomiting   Complete by: As directed    Call MD for:  temperature >100.4   Complete by: As directed    Discharge instructions   Complete by: As directed    1)Very Low-salt diet advised---Less than 2 gm of Sodium per day advised----ok to use Mrs DASH salt substitute instead of Salt 2)Weigh yourself daily, call if you gain more than 3 pounds in 1 day or more than 5 pounds in 1 week as your diuretic medications may need to be adjusted 3) please repeat CBC blood test in about a week   Face-to-face encounter (required for Medicare/Medicaid patients)   Complete by: As directed    I Aloise Copus certify that this patient is under my care and that I, or a nurse practitioner or physician's assistant working with me, had a face-to-face encounter that meets the physician face-to-face encounter requirements with this patient on 09/20/2024. The encounter with the patient was in whole, or in part for the following medical condition(s) which is the primary reason for home health care (List medical condition):  --Congestive heart failure with generalized weakness and deconditioning   The encounter with the patient was in whole, or in part, for the following medical condition, which is the primary reason for home health care: Congestive heart  failure with generalized weakness and deconditioning   I certify that, based on my findings, the following services are medically necessary home health services: Physical therapy   Reason for Medically Necessary Home Health Services: Skilled Nursing- Change/Decline in Patient Status   My clinical findings support the need for the above services: Shortness of breath with activity   Further, I certify that my clinical findings support that this patient is homebound due to: Shortness of Breath with activity   Home Health   Complete by: As directed    Congestive heart failure with generalized weakness and deconditioning   To provide the following care/treatments: PT   Increase activity slowly   Complete by: As directed         Discharge Medications     Allergies as of 09/20/2024       Reactions   Alendronate Swelling, Other (See Comments)   Leg edema   Lisinopril  Anaphylaxis, Swelling, Other (See Comments)   Tongue swelling   Losartan Swelling, Other (See Comments)   Tongue swelling   Clonidine And Derivatives Other (See Comments)   Dizziness    Codeine Nausea And Vomiting   Hydrocodone Nausea And Vomiting   Hydrocodone-acetaminophen  Nausea And Vomiting, Nausea Only   Pioglitazone Swelling   Swelling of ankles   Ramelteon Nausea And Vomiting, Nausea Only   Rosuvastatin  Other (See Comments)   Muscle cramps   Valsartan Nausea And Vomiting, Other (See Comments)   Dizziness        Medication List     STOP taking these medications    amoxicillin-clavulanate 875-125 MG tablet Commonly known as: AUGMENTIN       TAKE these medications    amLODipine  10 MG tablet Commonly known as: NORVASC  Take 1 tablet (10 mg total) by mouth daily. Start taking on: September 21, 2024 What changed: Another medication with the same name was removed. Continue taking this medication, and follow the directions you see here.   aspirin  EC 81 MG tablet Take 1 tablet (81 mg total) by mouth  daily with breakfast. What changed: when to take this   cephALEXin  500 MG capsule  Commonly known as: KEFLEX  Take 1 capsule (500 mg total) by mouth 3 (three) times daily for 5 days. What changed:  when to take this additional instructions   cloNIDine 0.1 MG tablet Commonly known as: CATAPRES Take 0.1 mg by mouth 2 (two) times daily.   clopidogrel  75 MG tablet Commonly known as: PLAVIX  take 1 tablet by mouth daily   doxycycline  100 MG tablet Commonly known as: VIBRA -TABS Take 1 tablet (100 mg total) by mouth 2 (two) times daily for 5 days.   ezetimibe  10 MG tablet Commonly known as: ZETIA  take 1 tablet (10 MILLIGRAM total) by mouth daily.   furosemide  20 MG tablet Commonly known as: LASIX  Take 1 tablet (20 mg total) by mouth 2 (two) times daily. What changed: when to take this   gabapentin  100 MG capsule Commonly known as: NEURONTIN  Take 100 mg by mouth daily.   HumaLOG  KwikPen 200 UNIT/ML KwikPen Generic drug: insulin  lispro Inject 16 Units into the skin with breakfast, with lunch, and with evening meal.   hydrALAZINE  100 MG tablet Commonly known as: APRESOLINE  Take 100 mg by mouth 3 (three) times daily. What changed: Another medication with the same name was removed. Continue taking this medication, and follow the directions you see here.   icosapent  Ethyl 1 g capsule Commonly known as: Vascepa  Take 1 capsule (1 g total) by mouth 2 (two) times daily.   Jardiance  25 MG Tabs tablet Generic drug: empagliflozin  Take 25 mg by mouth daily.   labetalol  200 MG tablet Commonly known as: NORMODYNE  Take 1 tablet (200 mg total) by mouth 2 (two) times daily.   Lantus  SoloStar 100 UNIT/ML Solostar Pen Generic drug: insulin  glargine Inject 30 Units into the skin daily. Inject 18 units in lhe morning and 50 units every evening   NIFEdipine 90 MG 24 hr tablet Commonly known as: ADALAT CC Take 90 mg by mouth daily.   nitroGLYCERIN  0.4 MG SL tablet Commonly known as:  NITROSTAT  Place 1 tablet (0.4 mg total) under the tongue every 5 (five) minutes as needed for chest pain. What changed: when to take this   oxyCODONE -acetaminophen  10-325 MG tablet Commonly known as: PERCOCET Take 1 tablet by mouth 2 (two) times daily.       Major procedures and Radiology Reports - PLEASE review detailed and final reports for all details, in brief -   ECHOCARDIOGRAM LIMITED Result Date: 09/19/2024    ECHOCARDIOGRAM LIMITED REPORT   Patient Name:   JADWIGA FAIDLEY Date of Exam: 09/19/2024 Medical Rec #:  [id number]       Height:       65.0 in Accession #:    [id number]      Weight:       171.5 lb Date of Birth:  May 23, 1941       BSA:          1.853 m Patient Age:    83 years        BP:           169/45 mmHg Patient Gender: F               HR:           65 bpm. Exam Location:  [facility] Procedure: Limited Echo (Both Spectral and Color Flow Doppler were utilized            during procedure). Indications:    CHF-Acute Diastolic I50.31  History:        Patient has prior history  of Echocardiogram examinations, most                 recent 08/15/2024. CHF, CAD, COPD and Stroke; Risk                 Factors:Hypertension, Diabetes and Dyslipidemia.  Sonographer:    Aida Pizza RCS Referring Phys: [id number] OLADAPO ADEFESO IMPRESSIONS  1. Limited study.  2. Left ventricular ejection fraction, by estimation, is 65 to 70%. The left ventricle has normal function. The left ventricle has no regional wall motion abnormalities. There is mild concentric left ventricular hypertrophy.  3. Right ventricular systolic function is normal. The right ventricular size is normal.  4. The mitral valve is degenerative.  5. The aortic valve is tricuspid. There is mild calcification of the aortic valve.  6. The inferior vena cava is normal in size with greater than 50% respiratory variability, suggesting right atrial pressure of 3 mmHg. FINDINGS  Left Ventricle: Left ventricular ejection fraction, by estimation, is  65 to 70%. The left ventricle has normal function. The left ventricle has no regional wall motion abnormalities. The left ventricular internal cavity size was normal in size. There is  mild concentric left ventricular hypertrophy. Right Ventricle: The right ventricular size is normal. No increase in right ventricular wall thickness. Right ventricular systolic function is normal. Pericardium: Trivial pericardial effusion is present. The pericardial effusion is posterior to the left ventricle. Presence of epicardial fat layer. Mitral Valve: The mitral valve is degenerative in appearance. Aortic Valve: The aortic valve is tricuspid. There is mild calcification of the aortic valve. There is mild aortic valve annular calcification. Aorta: The aortic root is normal in size and structure. Venous: The inferior vena cava is normal in size with greater than 50% respiratory variability, suggesting right atrial pressure of 3 mmHg. LEFT VENTRICLE PLAX 2D LVIDd:         4.00 cm LVIDs:         2.20 cm LV PW:         1.20 cm LV IVS:        1.20 cm  LV Volumes (MOD) LV vol d, MOD A2C: 97.9 ml LV vol d, MOD A4C: 94.8 ml LV vol s, MOD A2C: 28.7 ml LV vol s, MOD A4C: 34.2 ml LV SV MOD A2C:     69.2 ml LV SV MOD A4C:     94.8 ml LV SV MOD BP:      65.7 ml LEFT ATRIUM         Index LA diam:    3.80 cm 2.05 cm/m   AORTA Ao Root diam: 3.10 cm Jayson Sierras MD Electronically signed by Jayson Sierras MD Signature Date/Time: 09/19/2024/4:31:42 PM    Final    DG Chest Port 1 View Result Date: 09/18/2024 EXAM: 1 VIEW(S) XRAY OF THE CHEST 09/18/2024 10:06:00 PM COMPARISON: 08/14/2024 cxr, ct chest 08/14/24 CLINICAL HISTORY: SOB. FINDINGS: LUNGS AND PLEURA: Diffuse interstitial opacities. Small left and trace right pleural effusions, decreased from prior exam. No pneumothorax. HEART AND MEDIASTINUM: Unchanged cardiomediastinal silhouette. Aortic arch calcifications. BONES AND SOFT TISSUES: Cervical spine surgical hardware noted. Old  healed bilateral rib fractures. IMPRESSION: 1. Diffuse interstitial opacities. 2. Slight interval decrease of small left and trace right pleural effusions. Electronically signed by: Morgane Naveau MD 09/18/2024 10:21 PM EST RP Workstation: [id number]   Today   Subjective    Blima Banks today has no new complaints No fever  Or chills  No Nausea, Vomiting or Diarrhea  --  Ambulated over 300 feet in the hallway at rest O2 sats 95% on room air - With ambulation and post ambulation O2 sats 92 to 94% on room air - No chest pains, no dizziness, no palpitations - No dyspnea at rest    Patient has been seen and examined prior to discharge   Objective   Blood pressure (!) 153/45, pulse 63, temperature 97.6 F (36.4 C), temperature source Oral, resp. rate 16, height 5' 5 (1.651 m), weight 76.5 kg, SpO2 93%.   Intake/Output Summary (Last 24 hours) at 09/20/2024 1350 Last data filed at 09/20/2024 1204 Gross per 24 hour  Intake 642.18 ml  Output 2300 ml  Net -1657.82 ml    Exam Gen:- Awake Alert, no acute distress , speaking in complete sentences HEENT:- .AT, No sclera icterus Neck-Supple Neck,No JVD,.  Lungs-  -improved air movement, no wheezing, no rales CV- S1, S2 normal, regular Abd-  +ve B.Sounds, Abd Soft, No tenderness,    Extremity/Skin:-Overall improved edema,   good pulses Psych-affect is appropriate, oriented x3 Neuro-denies weakness, no new focal deficits, no tremors  -MSK/Extremity/Skin: left lower extremity ecchymosis/swelling from recent vascular procedure   Data Review   CBC w Diff:  Lab Results  Component Value Date   WBC 11.0 (H) 09/19/2024   HGB 8.6 (L) 09/19/2024   HCT 27.0 (L) 09/19/2024   PLT 588 (H) 09/19/2024   LYMPHOPCT 9 08/14/2024   MONOPCT 7 08/14/2024   EOSPCT 3 08/14/2024   BASOPCT 0 08/14/2024    CMP:  Lab Results  Component Value Date   NA 136 09/19/2024   K 3.7 09/19/2024   CL 99 09/19/2024   CO2 25 09/19/2024   BUN 11  09/19/2024   CREATININE 0.58 09/19/2024   PROT 6.5 09/19/2024   ALBUMIN 3.3 (L) 09/19/2024   BILITOT 0.4 09/19/2024   ALKPHOS 116 09/19/2024   AST 11 (L) 09/19/2024   ALT 8 09/19/2024  .  Total Discharge time is about 33 minutes  Rendall Carwin M.D on 09/20/2024 at 1:50 PM  Go to [email or web address] -  for contact info  [facility] - Office  [phone]   "

## 2024-09-20 NOTE — Discharge Instructions (Signed)
 1)Very Low-salt diet advised---Less than 2 gm of Sodium per day advised----ok to use Mrs DASH salt substitute instead of Salt 2)Weigh yourself daily, call if you gain more than 3 pounds in 1 day or more than 5 pounds in 1 week as your diuretic medications may need to be adjusted 3) please repeat CBC blood test in about a week

## 2024-09-20 NOTE — TOC Transition Note (Addendum)
 Transition of Care [facility]) - Discharge Note   Patient Details  Name: [name] [name] MRN: [id number] Date of Birth: January 27, 1941  [facility]) CM/SW Contact:  Hoy DELENA Bigness, LCSW Phone Number: 09/20/2024, 1:53 PM   Clinical Narrative:    Pt to return home with home health PT provided by [facility]. HH order in place. Pt requesting BSC. BSC ordered through [facility] to be delivered to pt's home. No further ICM needs identified.    Final next level of care: Home w Home Health Services Barriers to Discharge: Barriers Resolved   Patient Goals and CMS Choice Patient states their goals for this hospitalization and ongoing recovery are:: To return home CMS Medicare.gov Compare Post Acute Care list provided to:: Patient Choice offered to / list presented to : Patient      Discharge Placement                       Discharge Plan and Services Additional resources added to the After Visit Summary for                  DME Arranged: N/A DME Agency: NA                  Social Drivers of Health (SDOH) Interventions SDOH Screenings   Food Insecurity: No Food Insecurity (09/19/2024)  Housing: Low Risk (09/19/2024)  Transportation Needs: No Transportation Needs (09/19/2024)  Utilities: Not At Risk (09/19/2024)  Depression (PHQ2-9): Low Risk (06/21/2024)  Financial Resource Strain: Low Risk (10/06/2023)   Received from [facility]  Social Connections: Socially Isolated (09/19/2024)  Stress: No Stress Concern Present (08/03/2024)   Received from [facility]  Tobacco Use: Medium Risk (09/19/2024)     Readmission Risk Interventions    08/17/2024   10:13 AM  Readmission Risk Prevention Plan  Transportation Screening Complete  PCP or Specialist Appt within 5-7 Days Complete  Home Care Screening Complete  Medication Review (RN CM) Complete

## 2024-09-20 NOTE — Plan of Care (Signed)
  Problem: Education: Goal: Knowledge of General Education information will improve Description: Including pain rating scale, medication(s)/side effects and non-pharmacologic comfort measures Outcome: Progressing   Problem: Health Behavior/Discharge Planning: Goal: Ability to manage health-related needs will improve Outcome: Progressing   Problem: Clinical Measurements: Goal: Ability to maintain clinical measurements within normal limits will improve Outcome: Progressing Goal: Will remain free from infection Outcome: Progressing Goal: Diagnostic test results will improve Outcome: Progressing Goal: Respiratory complications will improve Outcome: Progressing Goal: Cardiovascular complication will be avoided Outcome: Progressing   Problem: Activity: Goal: Risk for activity intolerance will decrease Outcome: Progressing   Problem: Nutrition: Goal: Adequate nutrition will be maintained Outcome: Progressing   Problem: Coping: Goal: Level of anxiety will decrease Outcome: Progressing   Problem: Elimination: Goal: Will not experience complications related to bowel motility Outcome: Progressing Goal: Will not experience complications related to urinary retention Outcome: Progressing   Problem: Pain Managment: Goal: General experience of comfort will improve and/or be controlled Outcome: Progressing   Problem: Safety: Goal: Ability to remain free from injury will improve Outcome: Progressing   Problem: Skin Integrity: Goal: Risk for impaired skin integrity will decrease Outcome: Progressing   Problem: Education: Goal: Ability to describe self-care measures that may prevent or decrease complications (Diabetes Survival Skills Education) will improve Outcome: Progressing Goal: Individualized Educational Video(s) Outcome: Progressing   Problem: Coping: Goal: Ability to adjust to condition or change in health will improve Outcome: Progressing   Problem: Fluid  Volume: Goal: Ability to maintain a balanced intake and output will improve Outcome: Progressing   Problem: Health Behavior/Discharge Planning: Goal: Ability to identify and utilize available resources and services will improve Outcome: Progressing Goal: Ability to manage health-related needs will improve Outcome: Progressing   Problem: Metabolic: Goal: Ability to maintain appropriate glucose levels will improve Outcome: Progressing   Problem: Nutritional: Goal: Maintenance of adequate nutrition will improve Outcome: Progressing Goal: Progress toward achieving an optimal weight will improve Outcome: Progressing   Problem: Skin Integrity: Goal: Risk for impaired skin integrity will decrease Outcome: Progressing   Problem: Tissue Perfusion: Goal: Adequacy of tissue perfusion will improve Outcome: Progressing   Problem: Clinical Measurements: Goal: Ability to avoid or minimize complications of infection will improve Outcome: Progressing   Problem: Skin Integrity: Goal: Skin integrity will improve Outcome: Progressing

## 2024-09-20 NOTE — Progress Notes (Signed)
 For patient to safely return home she will need a 3in1 as pt's toilet is to low to the ground for pt to safely transfer to and from.     Hoy Bigness MSW, LCSW Environmental Manager

## 2024-09-20 NOTE — Care Management Important Message (Signed)
 Important Message  Patient Details  Name: [name] [name] MRN: [id number] Date of Birth: 1940/12/02   Important Message Given:  N/A - LOS <3 / Initial given by admissions     Donae Kueker L Latrel Szymczak 09/20/2024, 3:33 PM

## 2024-09-21 ENCOUNTER — Telehealth: Payer: Self-pay | Admitting: Cardiology

## 2024-09-21 NOTE — Telephone Encounter (Signed)
 Pt calling in stating she was seen in the hospital and wants to speak with Dr. Alvan or if she needs to make an appt. Attempted to make her an appointment but she can only do afternoon and can't do Fridays so the next I offered was 10/11/24 but she stated that was too far. Please advise.

## 2024-09-21 NOTE — Telephone Encounter (Signed)
 Pt states that she now on oxygen  and is requesting portable oxygen . Pt encouraged to reach out to PCP for appt to have portable oxygen  ordered. Please advise.

## 2024-09-21 NOTE — Telephone Encounter (Signed)
 Pt scheduled to see E.Peck 10/12/24 at 2:20 pm.

## 2024-09-24 ENCOUNTER — Encounter: Payer: Self-pay | Admitting: Nurse Practitioner

## 2024-09-24 NOTE — Telephone Encounter (Signed)
 Pt called back in requesting sooner appt.

## 2024-09-24 NOTE — Telephone Encounter (Signed)
 error

## 2024-10-01 ENCOUNTER — Encounter: Payer: Self-pay | Admitting: Nurse Practitioner

## 2024-10-01 ENCOUNTER — Ambulatory Visit: Attending: Nurse Practitioner | Admitting: Nurse Practitioner

## 2024-10-01 VITALS — BP 160/60 | HR 61 | Ht 65.0 in | Wt 170.8 lb

## 2024-10-01 DIAGNOSIS — E1159 Type 2 diabetes mellitus with other circulatory complications: Secondary | ICD-10-CM

## 2024-10-01 DIAGNOSIS — I2510 Atherosclerotic heart disease of native coronary artery without angina pectoris: Secondary | ICD-10-CM

## 2024-10-01 DIAGNOSIS — I5032 Chronic diastolic (congestive) heart failure: Secondary | ICD-10-CM | POA: Diagnosis not present

## 2024-10-01 DIAGNOSIS — E785 Hyperlipidemia, unspecified: Secondary | ICD-10-CM

## 2024-10-01 DIAGNOSIS — W19XXXA Unspecified fall, initial encounter: Secondary | ICD-10-CM

## 2024-10-01 DIAGNOSIS — Z79899 Other long term (current) drug therapy: Secondary | ICD-10-CM | POA: Diagnosis not present

## 2024-10-01 DIAGNOSIS — T148XXA Other injury of unspecified body region, initial encounter: Secondary | ICD-10-CM

## 2024-10-01 DIAGNOSIS — R42 Dizziness and giddiness: Secondary | ICD-10-CM

## 2024-10-01 DIAGNOSIS — I739 Peripheral vascular disease, unspecified: Secondary | ICD-10-CM

## 2024-10-01 DIAGNOSIS — I10 Essential (primary) hypertension: Secondary | ICD-10-CM

## 2024-10-01 DIAGNOSIS — J449 Chronic obstructive pulmonary disease, unspecified: Secondary | ICD-10-CM | POA: Diagnosis not present

## 2024-10-01 DIAGNOSIS — R079 Chest pain, unspecified: Secondary | ICD-10-CM | POA: Diagnosis not present

## 2024-10-01 DIAGNOSIS — R0609 Other forms of dyspnea: Secondary | ICD-10-CM | POA: Diagnosis not present

## 2024-10-01 NOTE — Progress Notes (Unsigned)
 "  Office Visit    Patient Name: [name] [name] Date of Encounter: 10/01/2024 PCP:  Medicine, [facility]  Cardiologist:  Alvan Carrier, MD  Advanced Practice Provider:  No care team member to display Electrophysiologist:  None   Chief Complaint    [name] [name] is a 83 y.o. female with a hx of CAD, hypertension, type 2 diabetes, hypercholesterolemia, hyperlipidemia, peripheral vascular disease, COPD, GERD, and prior history of stroke, who presents today for follow-up.   Previous cardiovascular history of remote history of MI in 1997 and angioplasty (unclear report/details regarding intervention), prior PTCA to dLAD in 2001. History of prior iliac stent in July 2001. DSE in 2013 revealed ST elevation in inferior leads, negative echo images for ischemia.  In 2019 CCTA suggested severe RCA disease, FFR was abnormal along left circumflex and distal LAD.  Underwent cardiac catheterization in 2019 (see report below), and received 3 drug-eluting stents to RCA, OM1 was found to have 75% stenosis, medically managed. Closely followed by [facility].   Was admitted in 2021 with CVA.  MRI showed acute right frontal stroke.  Neuro recommended continuing DAPT x 4 weeks and then Plavix  only.  Previously completed aspirin .   04/19/2023 - Today she presents for follow-up.  Doing well. Has started Leqvio  infusions at [facility]. Tolerating well. Does admit to leg weakness and back arthritis, denies any recent falls, but says at times it feels like she's going to fall. Ever since adjusting dose of Hydralazine  at last OV, does admit to occasional lightheadedness. Overall doing well from a cardiac perspective.  Denies any chest pain, shortness of breath, palpitations, syncope, presyncope, dizziness, orthopnea, PND, significant weight changes, acute bleeding, or claudication.  07/21/2023 -she does admit to chronic back pain and leg weakness, says her symptoms go  away when she sits down.  Reports SBP averages 160s and 170s at home, sometimes she has to hold her SBP for certain BP readings per her report. Denies any chest pain, shortness of breath, palpitations, syncope, presyncope, dizziness, orthopnea, PND, swelling or significant weight changes, acute bleeding.  Ever since her stroke, says she cannot see out of her left eye, does have chronic numbness along her left arm.  Says she will be getting an aide soon and that will be helping her.  09/20/2023 - Today she presents for follow-up. Doing well. Continues to admit to chronic back pain and leg weakness, says her symptoms go away when she sits down. Blood pressure is well controlled at home. States BP at home this morning was 101/52. She is scheduled to see [facility] with [facility] on 12/30 for evaluation. Tolerating Leqvio  injections well. Denies any cardiac complaints or issues. Denies any chest pain, shortness of breath, palpitations, syncope, presyncope, dizziness, orthopnea, PND, swelling or significant weight changes, acute bleeding.  Saw Dr. Alvan in June 2025.  Was overall doing well.  It had been noted that her BP often ran higher in clinic in general.  Was doing well on Leqvio  and Zetia .  Overall doing well at the time.  History of left femoral embolectomy done at [facility] earlier this year, then admitted from October - November 2025 for left groin infection after revascularization and ultimately underwent redo left groin revascularization and wound debridement with Dr. Isidore. 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   Since I have last seen her, she has had hospitalizations. Most recently hospitalized in December 2025 due to acute exacerbation of CHF.  Was treated  with IV Lasix .  Echocardiogram revealed normal LVEF, mild LVH -see full report below.  Was also treated for left leg cellulitis, received antibiotics.  She is here for hospital follow-up.  She states she has bruising all over her body,  had a fall last Saturday, denied any syncope and describes what sounds like a mechanical fall, denies any acute injuries or head injuries. Says she did have chest pain that day, difficult for her to describe but denies any recurrent CP since then. Does admit to chronic and stable DOE. Denies any palpitations, syncope, presyncope, orthopnea, PND, swelling or significant weight changes, or claudication. There seems to be some confusion regarding her medications, as our records do not match what she is taking completely. Does notice some dizziness at times and wonders if this is related to her medications.   EKGs/Labs/Other Studies Reviewed:   The following studies were reviewed today:  EKG:  EKG is not ordered today.   Echo limited 09/2024:  1. Limited study.   2. Left ventricular ejection fraction, by estimation, is 65 to 70%. The  left ventricle has normal function. The left ventricle has no regional  wall motion abnormalities. There is mild concentric left ventricular  hypertrophy.   3. Right ventricular systolic function is normal. The right ventricular  size is normal.   4. The mitral valve is degenerative.   5. The aortic valve is tricuspid. There is mild calcification of the  aortic valve.   6. The inferior vena cava is normal in size with greater than 50%  respiratory variability, suggesting right atrial pressure of 3 mmHg.   Echo complete 08/2024:    1. Left ventricular ejection fraction, by estimation, is 65 to 70%. The  left ventricle has normal function. The left ventricle has no regional  wall motion abnormalities. There is mild concentric left ventricular  hypertrophy. Left ventricular diastolic  parameters are consistent with Grade III diastolic dysfunction  (restrictive). Elevated left atrial pressure.   2. Right ventricular systolic function is normal. The right ventricular  size is normal. There is normal pulmonary artery systolic pressure. The  estimated right  ventricular systolic pressure is 34.4 mmHg.   3. Left atrial size was mildly dilated.   4. The mitral valve is degenerative. Mild mitral valve regurgitation.   5. The aortic valve is tricuspid. There is mild calcification of the  aortic valve. Aortic valve regurgitation is not visualized. Aortic valve  sclerosis/calcification is present, without any evidence of aortic  stenosis.   6. The inferior vena cava is normal in size with <50% respiratory  variability, suggesting right atrial pressure of 8 mmHg.   Comparison(s): Prior images reviewed side by side. LVEF 65-70% with grade  3 diastolic dysfunction.  Vascular ultrasound aorta/IVC/iliacs complete 04/2023: Summary:  Abdominal Aorta: No evidence of an abdominal aortic aneurysm was  visualized. The largest aortic measurement is 2.1 cm. Previous diameter  measurement was obtained on 04/05/2022.  Stenosis: +--------------------+-------------+  Location            Stenosis       +--------------------+-------------+  Right External Iliac>50% stenosis  +--------------------+-------------+  Left External Iliac >50% stenosis  +--------------------+-------------+   IVC/Iliac: There is no evidence of thrombus involving the IVC.  Vascular ultrasound ABI 04/2023: Summary:  Right: Resting right ankle-brachial index indicates moderate right lower  extremity arterial disease. The right toe-brachial index is abnormal.   Left: Resting left ankle-brachial index indicates moderate left lower  extremity arterial disease. The left toe-brachial index is  abnormal.   Vascular UA aorta/IVC/Iliacs 04/2022: Right common and right external iliac artery have >50% stenosis. There is  >50% in-stent left common iliac artery stenosis.   IVC/Iliac:   See ABI study.    *See table(s) above for measurements and observations.  Suggest follow up study in 12 months.   ABI's 04/2022:  Summary:  Right: Resting right ankle-brachial index is within normal  range. No  evidence of significant right lower extremity arterial disease. The right  toe-brachial index is abnormal.   Left: Resting left ankle-brachial index indicates moderate left lower  extremity arterial disease. The left toe-brachial index is abnormal.    *See table(s) above for measurements and observations.    Echo 2021:  1. Left ventricular ejection fraction, by estimation, is 65 to 70%. The  left ventricle has normal function. The left ventricle has no regional  wall motion abnormalities. There is mild left ventricular hypertrophy.  Left ventricular diastolic parameters  are indeterminate.   2. Right ventricular systolic function is normal. The right ventricular  size is normal. Tricuspid regurgitation signal is inadequate for assessing  PA pressure.   3. The mitral valve is grossly normal. Trivial mitral valve  regurgitation.   4. The aortic valve is tricuspid. Aortic valve regurgitation is not  visualized. Mild aortic valve sclerosis is present, with no evidence of  aortic valve stenosis.   5. The inferior vena cava is normal in size with greater than 50%  respiratory variability, suggesting right atrial pressure of 3 mmHg.   6. Agitated saline contrast bubble study was negative, with no evidence  of any interatrial shunt.   LHC 03/2018: Dist RCA lesion is 75% stenosed. Mid RCA lesion is 99% stenosed. Prox RCA to Mid RCA lesion is 60% stenosed. Drug-eluting stent was successfully placed: STENT SYNERGY DES 3.5X38., 4.0 x 28 mm and 4.0 x 12 mm. Post intervention, there is a 0% residual stenosis. Ost 1st Mrg lesion is 50% stenosed. 1st Mrg lesion is 75% stenosed. This was a small vessel. Mid Cx lesion is 60% stenosed. THis was a fairly small vessel. Mid LAD lesion is 60% stenosed. THis was not significant by CT FFR. Prox LAD to Mid LAD lesion is 25% stenosed. 2nd Diag lesion is 75% stenosed. The left ventricular systolic function is normal. LV end diastolic pressure  is normal. There is no aortic valve stenosis.   Recommend uninterrupted dual antiplatelet therapy with Aspirin  81mg  daily and Clopidogrel  75mg  daily for a minimum of 12 months (ACS - Class I recommendation).    Continue aggressive secondary prevention.    Medical management for now of the left sided disease.  RCA was clearly the most significant lesion and the largest ischemic territory.    Review of Systems   All other systems reviewed and are otherwise negative except as noted above.  Physical Exam    VS:  BP (!) 160/60 (BP Location: Right Arm, Cuff Size: Normal)   Pulse 61   Ht 5' 5 (1.651 m)   Wt 170 lb 12.8 oz (77.5 kg)   SpO2 94%   BMI 28.42 kg/m  , BMI Body mass index is 28.42 kg/m.  Wt Readings from Last 3 Encounters:  10/01/24 170 lb 12.8 oz (77.5 kg)  09/20/24 168 lb 9.6 oz (76.5 kg)  08/17/24 166 lb 14.2 oz (75.7 kg)    GEN: Overweight, 83 y.o. female, in no acute distress. HEENT: normal. Neck: Supple, no JVD, carotid bruits, or masses. Cardiac: S1/S2, RRR, no murmurs, rubs,  or gallops. No clubbing, cyanosis, edema.  Radials2+ equal bilaterally, PT 1+.  Respiratory:  Respirations regular and unlabored, clear to auscultation bilaterally. MS: No deformity or atrophy. Skin: Warm and dry, no rash, no edema, generalized, scattered bruising. Neuro:  Strength and sensation are intact. Psych: Normal affect.  Assessment & Plan    Chronic diastolic CHF, medication management Stage C, NYHA class II-III symptoms. EF 65-70% seen recently. Euvolemic and well compensated on exam. Will obtain proBNP, CMET, and Mag. Will bring her back in for med check. Low sodium diet, fluid restriction <2L, and daily weights encouraged. Educated to contact our office for weight gain of 2 lbs overnight or 5 lbs in one week.  CAD, chest pain of uncertain etiology Denies any recurrent CP since last Saturday. No indication for ischemic evaluation at this time. Continue ASA, Plavix , Zetia ,  Labetalol , and NTG PRN. Heart healthy diet and regular cardiovascular exercise encouraged. I recommended that she returns in 1-2 weeks for a med check. Will obtain CBC, CMET.   HTN BP elevated, has confirmed dx of white coat hypertension, BP well controlled at home, however there is some discrepancies with her med list vs what she is taking. Continue current medication regimen at this time. Will bring her back in 1-2 weeks for a med check.   Discussed to monitor BP at home at least 2 hours after medications and sitting for 5-10 minutes.  Heart healthy diet and regular cardiovascular exercise encouraged. Care and ED precautions discussed.   PAD, s/p left femoral embolectomy and revascularization sx, HLD Doing well post procedure and following [facility] team. She is tolerating Leqvio  infusions well at Clinic. Last LDL 05/2024 54 - she is at goal. Continue current medication therapy.  Bringing her back in for a med check. Follow-up with [facility] as scheduled.   T2DM Being managed by PCP. Continue current medication regimen.  Diabetic, heart healthy diet and regular cardiovascular exercise encouraged.   5. Fall, dizziness Etiology multifactorial and unclear. Denies any orthostatic symptoms. Will bring her back in 1-2 weeks for a med check and will obtain orthostatics at that time. Continue to follow with PCP. Care and ED precautions discussed. Will obtain labs as noted above.   6. DOE, COPD Chronic and stable. No med changes at this time. Continue to follow with PCP.  7. Bruising Recent fall. Will obtain CBC along with labs as noted above. Continue to follow with PCP.  Disposition: Follow up in 1 month with Alvan Carrier, MD or APP.  Signed, Almarie Crate, NP "

## 2024-10-01 NOTE — Patient Instructions (Addendum)
 Medication Instructions:  Your physician recommends that you continue on your current medications as directed. Please refer to the Current Medication list given to you today.   Labwork: CBC, BMET, and ProBNP to be completed in 1 week at [facility]  Testing/Procedures: None  Follow-Up: Your physician recommends that you schedule a follow-up appointment in: 1 month  Any Other Special Instructions Will Be Listed Below (If Applicable). Thank you for choosing [facility]!     If you need a refill on your cardiac medications before your next appointment, please call your pharmacy.

## 2024-10-08 ENCOUNTER — Emergency Department (HOSPITAL_COMMUNITY)

## 2024-10-08 ENCOUNTER — Other Ambulatory Visit: Payer: Self-pay

## 2024-10-08 ENCOUNTER — Ambulatory Visit: Payer: Self-pay | Admitting: Nurse Practitioner

## 2024-10-08 ENCOUNTER — Other Ambulatory Visit (HOSPITAL_COMMUNITY)
Admission: RE | Admit: 2024-10-08 | Discharge: 2024-10-08 | Disposition: A | Discharge status: Home / Self Care | Source: Ambulatory Visit | Attending: Nurse Practitioner | Admitting: Nurse Practitioner

## 2024-10-08 ENCOUNTER — Inpatient Hospital Stay (HOSPITAL_COMMUNITY)
Admission: EM | Admit: 2024-10-08 | Discharge: 2024-10-11 | DRG: 291 | Disposition: A | Discharge status: Home Health | Attending: Internal Medicine | Admitting: Internal Medicine

## 2024-10-08 DIAGNOSIS — I5023 Acute on chronic systolic (congestive) heart failure: Principal | ICD-10-CM | POA: Diagnosis present

## 2024-10-08 DIAGNOSIS — Z8673 Personal history of transient ischemic attack (TIA), and cerebral infarction without residual deficits: Secondary | ICD-10-CM

## 2024-10-08 DIAGNOSIS — Z9842 Cataract extraction status, left eye: Secondary | ICD-10-CM

## 2024-10-08 DIAGNOSIS — R42 Dizziness and giddiness: Secondary | ICD-10-CM | POA: Insufficient documentation

## 2024-10-08 DIAGNOSIS — Z8249 Family history of ischemic heart disease and other diseases of the circulatory system: Secondary | ICD-10-CM

## 2024-10-08 DIAGNOSIS — Z7951 Long term (current) use of inhaled steroids: Secondary | ICD-10-CM

## 2024-10-08 DIAGNOSIS — R799 Abnormal finding of blood chemistry, unspecified: Secondary | ICD-10-CM

## 2024-10-08 DIAGNOSIS — Z87891 Personal history of nicotine dependence: Secondary | ICD-10-CM

## 2024-10-08 DIAGNOSIS — Z825 Family history of asthma and other chronic lower respiratory diseases: Secondary | ICD-10-CM

## 2024-10-08 DIAGNOSIS — E1151 Type 2 diabetes mellitus with diabetic peripheral angiopathy without gangrene: Secondary | ICD-10-CM | POA: Diagnosis present

## 2024-10-08 DIAGNOSIS — Z9851 Tubal ligation status: Secondary | ICD-10-CM

## 2024-10-08 DIAGNOSIS — Z833 Family history of diabetes mellitus: Secondary | ICD-10-CM

## 2024-10-08 DIAGNOSIS — Z885 Allergy status to narcotic agent status: Secondary | ICD-10-CM

## 2024-10-08 DIAGNOSIS — Z9841 Cataract extraction status, right eye: Secondary | ICD-10-CM

## 2024-10-08 DIAGNOSIS — Z7984 Long term (current) use of oral hypoglycemic drugs: Secondary | ICD-10-CM

## 2024-10-08 DIAGNOSIS — I5032 Chronic diastolic (congestive) heart failure: Secondary | ICD-10-CM

## 2024-10-08 DIAGNOSIS — E1165 Type 2 diabetes mellitus with hyperglycemia: Secondary | ICD-10-CM | POA: Diagnosis present

## 2024-10-08 DIAGNOSIS — I2510 Atherosclerotic heart disease of native coronary artery without angina pectoris: Secondary | ICD-10-CM | POA: Diagnosis present

## 2024-10-08 DIAGNOSIS — I5033 Acute on chronic diastolic (congestive) heart failure: Secondary | ICD-10-CM | POA: Diagnosis present

## 2024-10-08 DIAGNOSIS — Z79899 Other long term (current) drug therapy: Secondary | ICD-10-CM

## 2024-10-08 DIAGNOSIS — Z7902 Long term (current) use of antithrombotics/antiplatelets: Secondary | ICD-10-CM

## 2024-10-08 DIAGNOSIS — J449 Chronic obstructive pulmonary disease, unspecified: Secondary | ICD-10-CM | POA: Diagnosis present

## 2024-10-08 DIAGNOSIS — I110 Hypertensive heart disease with heart failure: Principal | ICD-10-CM | POA: Diagnosis present

## 2024-10-08 DIAGNOSIS — Z955 Presence of coronary angioplasty implant and graft: Secondary | ICD-10-CM

## 2024-10-08 DIAGNOSIS — Z7982 Long term (current) use of aspirin: Secondary | ICD-10-CM

## 2024-10-08 DIAGNOSIS — Z888 Allergy status to other drugs, medicaments and biological substances status: Secondary | ICD-10-CM

## 2024-10-08 DIAGNOSIS — Z794 Long term (current) use of insulin: Secondary | ICD-10-CM

## 2024-10-08 DIAGNOSIS — D649 Anemia, unspecified: Secondary | ICD-10-CM | POA: Diagnosis present

## 2024-10-08 DIAGNOSIS — Z981 Arthrodesis status: Secondary | ICD-10-CM

## 2024-10-08 DIAGNOSIS — E7800 Pure hypercholesterolemia, unspecified: Secondary | ICD-10-CM | POA: Diagnosis present

## 2024-10-08 DIAGNOSIS — J9601 Acute respiratory failure with hypoxia: Secondary | ICD-10-CM | POA: Diagnosis present

## 2024-10-08 DIAGNOSIS — L539 Erythematous condition, unspecified: Secondary | ICD-10-CM | POA: Diagnosis present

## 2024-10-08 DIAGNOSIS — R748 Abnormal levels of other serum enzymes: Secondary | ICD-10-CM

## 2024-10-08 LAB — CBC WITH DIFFERENTIAL/PLATELET
Abs Immature Granulocytes: 0.03 K/uL (ref 0.00–0.07)
Basophils Absolute: 0.1 K/uL (ref 0.0–0.1)
Basophils Relative: 1 %
Eosinophils Absolute: 0.3 K/uL (ref 0.0–0.5)
Eosinophils Relative: 3 %
HCT: 27.8 % — ABNORMAL LOW (ref 36.0–46.0)
Hemoglobin: 8.7 g/dL — ABNORMAL LOW (ref 12.0–15.0)
Immature Granulocytes: 0 %
Lymphocytes Relative: 12 %
Lymphs Abs: 1.1 K/uL (ref 0.7–4.0)
MCH: 27.2 pg (ref 26.0–34.0)
MCHC: 31.3 g/dL (ref 30.0–36.0)
MCV: 86.9 fL (ref 80.0–100.0)
Monocytes Absolute: 0.7 K/uL (ref 0.1–1.0)
Monocytes Relative: 8 %
Neutro Abs: 7.1 K/uL (ref 1.7–7.7)
Neutrophils Relative %: 76 %
Platelets: 373 K/uL (ref 150–400)
RBC: 3.2 MIL/uL — ABNORMAL LOW (ref 3.87–5.11)
RDW: 16.8 % — ABNORMAL HIGH (ref 11.5–15.5)
WBC: 9.4 K/uL (ref 4.0–10.5)
nRBC: 0 % (ref 0.0–0.2)

## 2024-10-08 LAB — COMPREHENSIVE METABOLIC PANEL WITH GFR
ALT: 8 U/L (ref 0–44)
AST: 13 U/L — ABNORMAL LOW (ref 15–41)
Albumin: 3.9 g/dL (ref 3.5–5.0)
Alkaline Phosphatase: 132 U/L — ABNORMAL HIGH (ref 38–126)
Anion gap: 9 (ref 5–15)
BUN: 24 mg/dL — ABNORMAL HIGH (ref 8–23)
CO2: 28 mmol/L (ref 22–32)
Calcium: 9.1 mg/dL (ref 8.9–10.3)
Chloride: 98 mmol/L (ref 98–111)
Creatinine, Ser: 0.7 mg/dL (ref 0.44–1.00)
GFR, Estimated: >60 mL/min
Glucose, Bld: 280 mg/dL — ABNORMAL HIGH (ref 70–99)
Potassium: 3.8 mmol/L (ref 3.5–5.1)
Sodium: 135 mmol/L (ref 135–145)
Total Bilirubin: 0.6 mg/dL (ref 0.0–1.2)
Total Protein: 7.2 g/dL (ref 6.5–8.1)

## 2024-10-08 LAB — BASIC METABOLIC PANEL WITH GFR
Anion gap: 10 (ref 5–15)
BUN: 24 mg/dL — ABNORMAL HIGH (ref 8–23)
CO2: 27 mmol/L (ref 22–32)
Calcium: 9 mg/dL (ref 8.9–10.3)
Chloride: 97 mmol/L — ABNORMAL LOW (ref 98–111)
Creatinine, Ser: 0.67 mg/dL (ref 0.44–1.00)
GFR, Estimated: >60 mL/min
Glucose, Bld: 257 mg/dL — ABNORMAL HIGH (ref 70–99)
Potassium: 3.8 mmol/L (ref 3.5–5.1)
Sodium: 133 mmol/L — ABNORMAL LOW (ref 135–145)

## 2024-10-08 LAB — CBC
HCT: 27.9 % — ABNORMAL LOW (ref 36.0–46.0)
Hemoglobin: 8.7 g/dL — ABNORMAL LOW (ref 12.0–15.0)
MCH: 27.1 pg (ref 26.0–34.0)
MCHC: 31.2 g/dL (ref 30.0–36.0)
MCV: 86.9 fL (ref 80.0–100.0)
Platelets: 376 K/uL (ref 150–400)
RBC: 3.21 MIL/uL — ABNORMAL LOW (ref 3.87–5.11)
RDW: 16.8 % — ABNORMAL HIGH (ref 11.5–15.5)
WBC: 8 K/uL (ref 4.0–10.5)
nRBC: 0 % (ref 0.0–0.2)

## 2024-10-08 LAB — PRO BRAIN NATRIURETIC PEPTIDE
Pro Brain Natriuretic Peptide: 1669 pg/mL — ABNORMAL HIGH
Pro Brain Natriuretic Peptide: 1527 pg/mL — ABNORMAL HIGH

## 2024-10-08 MED ORDER — FUROSEMIDE 10 MG/ML IJ SOLN
40.0000 mg | Freq: Once | INTRAMUSCULAR | Status: AC
  Administered 2024-10-08: 40 mg via INTRAVENOUS
  Filled 2024-10-08: qty 4

## 2024-10-08 MED ORDER — GABAPENTIN 100 MG PO CAPS
100.0000 mg | ORAL_CAPSULE | Freq: Once | ORAL | Status: AC
  Administered 2024-10-08: 100 mg via ORAL
  Filled 2024-10-08: qty 1

## 2024-10-08 NOTE — ED Triage Notes (Addendum)
 Pt bib ems from home complains of SOB for the last months, pt states she been seen here for same. Hx of DM and CHF, Initially 91% RA with EMS then 98% on 3L after 2.5 albuterol . Audible wheezes heard during triage. Pt currently 99 on RA. Pt denies flu related symptoms. Lower extremity edema present

## 2024-10-08 NOTE — ED Provider Notes (Signed)
 Care assumed from Dr. Garrick, patient with CHF exacerbation and rising BNP, hypoxic on room air.  Labs are pending, will need admission.  I have reviewed her labs, and my interpretation is markedly elevated proBNP consistent with heart failure, stable anemia, elevated BUN consistent with heart failure exacerbation, mild elevation of alkaline phosphatase of uncertain clinical significance.  I have discussed the case with Dr. Shona of [facility], who agrees to admit the patient.  Results for orders placed or performed during the hospital encounter of 10/08/24  Comprehensive metabolic panel   Collection Time: 10/08/24 10:59 PM  Result Value Ref Range   Sodium 135 135 - 145 mmol/L   Potassium 3.8 3.5 - 5.1 mmol/L   Chloride 98 98 - 111 mmol/L   CO2 28 22 - 32 mmol/L   Glucose, Bld 280 (H) 70 - 99 mg/dL   BUN 24 (H) 8 - 23 mg/dL   Creatinine, Ser 9.29 0.44 - 1.00 mg/dL   Calcium  9.1 8.9 - 10.3 mg/dL   Total Protein 7.2 6.5 - 8.1 g/dL   Albumin 3.9 3.5 - 5.0 g/dL   AST 13 (L) 15 - 41 U/L   ALT 8 0 - 44 U/L   Alkaline Phosphatase 132 (H) 38 - 126 U/L   Total Bilirubin 0.6 0.0 - 1.2 mg/dL   GFR, Estimated >39 >39 mL/min   Anion gap 9 5 - 15  Pro Brain natriuretic peptide   Collection Time: 10/08/24 10:59 PM  Result Value Ref Range   Pro Brain Natriuretic Peptide 1,527.0 (H) <300.0 pg/mL  CBC with Differential   Collection Time: 10/08/24 10:59 PM  Result Value Ref Range   WBC 9.4 4.0 - 10.5 K/uL   RBC 3.20 (L) 3.87 - 5.11 MIL/uL   Hemoglobin 8.7 (L) 12.0 - 15.0 g/dL   HCT 72.1 (L) 63.9 - 53.9 %   MCV 86.9 80.0 - 100.0 fL   MCH 27.2 26.0 - 34.0 pg   MCHC 31.3 30.0 - 36.0 g/dL   RDW 83.1 (H) 88.4 - 84.4 %   Platelets 373 150 - 400 K/uL   nRBC 0.0 0.0 - 0.2 %   Neutrophils Relative % 76 %   Neutro Abs 7.1 1.7 - 7.7 K/uL   Lymphocytes Relative 12 %   Lymphs Abs 1.1 0.7 - 4.0 K/uL   Monocytes Relative 8 %   Monocytes Absolute 0.7 0.1 - 1.0 K/uL   Eosinophils Relative 3 %    Eosinophils Absolute 0.3 0.0 - 0.5 K/uL   Basophils Relative 1 %   Basophils Absolute 0.1 0.0 - 0.1 K/uL   Immature Granulocytes 0 %   Abs Immature Granulocytes 0.03 0.00 - 0.07 K/uL   DG Chest Port 1 View Result Date: 10/08/2024 EXAM: 1 VIEW(S) XRAY OF THE CHEST 10/08/2024 10:22:00 PM COMPARISON: 09/18/2024. CLINICAL HISTORY: sob sob FINDINGS: LUNGS AND PLEURA: Moderate left pleural effusion loculated laterally. Diffuse interstitial opacities again noted. No focal pulmonary opacity. No pneumothorax. HEART AND MEDIASTINUM: Aortic atherosclerosis. The cardiac and mediastinal silhouettes are otherwise unremarkable. BONES AND SOFT TISSUES: No acute osseous abnormality. IMPRESSION: 1. Moderate left pleural effusion, loculated laterally. 2. Diffuse interstitial opacities. Electronically signed by: Franky Crease MD 10/08/2024 10:27 PM EST RP Workstation: [id number]   ECHOCARDIOGRAM LIMITED Result Date: 09/19/2024    ECHOCARDIOGRAM LIMITED REPORT   Patient Name:   [name] [name] Date of Exam: 09/19/2024 Medical Rec #:  [id number]       Height:       65.0 in  Accession #:    [id number]      Weight:       171.5 lb Date of Birth:  January 01, 1941       BSA:          1.853 m Patient Age:    83 years        BP:           169/45 mmHg Patient Gender: F               HR:           65 bpm. Exam Location:  [facility] Procedure: Limited Echo (Both Spectral and Color Flow Doppler were utilized            during procedure). Indications:    CHF-Acute Diastolic I50.31  History:        Patient has prior history of Echocardiogram examinations, most                 recent 08/15/2024. CHF, CAD, COPD and Stroke; Risk                 Factors:Hypertension, Diabetes and Dyslipidemia.  Sonographer:    Aida Pizza RCS Referring Phys: [id number] OLADAPO ADEFESO IMPRESSIONS  1. Limited study.  2. Left ventricular ejection fraction, by estimation, is 65 to 70%. The left ventricle has normal function. The left ventricle has no regional wall motion  abnormalities. There is mild concentric left ventricular hypertrophy.  3. Right ventricular systolic function is normal. The right ventricular size is normal.  4. The mitral valve is degenerative.  5. The aortic valve is tricuspid. There is mild calcification of the aortic valve.  6. The inferior vena cava is normal in size with greater than 50% respiratory variability, suggesting right atrial pressure of 3 mmHg. FINDINGS  Left Ventricle: Left ventricular ejection fraction, by estimation, is 65 to 70%. The left ventricle has normal function. The left ventricle has no regional wall motion abnormalities. The left ventricular internal cavity size was normal in size. There is  mild concentric left ventricular hypertrophy. Right Ventricle: The right ventricular size is normal. No increase in right ventricular wall thickness. Right ventricular systolic function is normal. Pericardium: Trivial pericardial effusion is present. The pericardial effusion is posterior to the left ventricle. Presence of epicardial fat layer. Mitral Valve: The mitral valve is degenerative in appearance. Aortic Valve: The aortic valve is tricuspid. There is mild calcification of the aortic valve. There is mild aortic valve annular calcification. Aorta: The aortic root is normal in size and structure. Venous: The inferior vena cava is normal in size with greater than 50% respiratory variability, suggesting right atrial pressure of 3 mmHg. LEFT VENTRICLE PLAX 2D LVIDd:         4.00 cm LVIDs:         2.20 cm LV PW:         1.20 cm LV IVS:        1.20 cm  LV Volumes (MOD) LV vol d, MOD A2C: 97.9 ml LV vol d, MOD A4C: 94.8 ml LV vol s, MOD A2C: 28.7 ml LV vol s, MOD A4C: 34.2 ml LV SV MOD A2C:     69.2 ml LV SV MOD A4C:     94.8 ml LV SV MOD BP:      65.7 ml LEFT ATRIUM         Index LA diam:    3.80 cm 2.05 cm/m   AORTA Ao Root diam: 3.10 cm  Jayson Sierras MD Electronically signed by Jayson Sierras MD Signature Date/Time: 09/19/2024/4:31:42 PM     Final    DG Chest Port 1 View Result Date: 09/18/2024 EXAM: 1 VIEW(S) XRAY OF THE CHEST 09/18/2024 10:06:00 PM COMPARISON: 08/14/2024 cxr, ct chest 08/14/24 CLINICAL HISTORY: SOB. FINDINGS: LUNGS AND PLEURA: Diffuse interstitial opacities. Small left and trace right pleural effusions, decreased from prior exam. No pneumothorax. HEART AND MEDIASTINUM: Unchanged cardiomediastinal silhouette. Aortic arch calcifications. BONES AND SOFT TISSUES: Cervical spine surgical hardware noted. Old healed bilateral rib fractures. IMPRESSION: 1. Diffuse interstitial opacities. 2. Slight interval decrease of small left and trace right pleural effusions. Electronically signed by: Morgane Naveau MD 09/18/2024 10:21 PM EST RP Workstation: [id number]      Raford Lenis, MD 10/09/24 0005

## 2024-10-08 NOTE — ED Provider Notes (Signed)
 " [place] EMERGENCY DEPARTMENT AT [facility] Provider Note   CSN: [id number] Arrival date & time: 10/08/24  2158     Patient presents with: Shortness of Breath   [name] [name] is a 84 y.o. female.   HPI Patient presents in respiratory distress.  She notes a history of congestive heart failure, states that she has been taking her Lasix . She was at the clinic earlier today because of shortness of breath, and symptoms progressed following that visit.  Reported the patient was made aware of need to increase her Lasix  dosing, but has not yet done so, and with worsening anxiety secondary to dyspnea she presents for evaluation by EMS. No pain, no syncope, no fever.    Prior to Admission medications  Medication Sig Start Date End Date Taking? Authorizing Provider  albuterol  (VENTOLIN  HFA) 108 (90 Base) MCG/ACT inhaler Inhale 1-2 puffs into the lungs. 09/25/24   [provider]  amLODipine  (NORVASC ) 10 MG tablet Take 1 tablet (10 mg total) by mouth daily. 09/21/24   Pearlean Manus, MD  aspirin  EC 81 MG tablet Take 1 tablet (81 mg total) by mouth daily with breakfast. 09/20/24   Pearlean Manus, MD  cephALEXin  (KEFLEX ) 500 MG capsule Take 500 mg by mouth 4 (four) times daily.    [provider]  cloNIDine  (CATAPRES ) 0.1 MG tablet Take 0.1 mg by mouth 2 (two) times daily. 08/16/24   [provider]  clopidogrel  (PLAVIX ) 75 MG tablet take 1 tablet by mouth daily 06/15/24   Alvan Dorn FALCON, MD  ezetimibe  (ZETIA ) 10 MG tablet take 1 tablet (10 MILLIGRAM total) by mouth daily. 05/14/24   Alvan Dorn FALCON, MD  furosemide  (LASIX ) 20 MG tablet Take 1 tablet (20 mg total) by mouth 2 (two) times daily. Patient taking differently: Take 20 mg by mouth daily. 09/20/24   Pearlean Manus, MD  gabapentin  (NEURONTIN ) 100 MG capsule Take 100 mg by mouth daily.    [provider]  HUMALOG  KWIKPEN 200 UNIT/ML KwikPen Inject 16 Units into the skin with  breakfast, with lunch, and with evening meal. 06/18/22   [provider]  hydrALAZINE  (APRESOLINE ) 100 MG tablet Take 100 mg by mouth 3 (three) times daily. Patient taking differently: Take 100 mg by mouth 2 (two) times daily. 08/16/24   [provider]  icosapent  Ethyl (VASCEPA ) 1 g capsule Take 1 capsule (1 g total) by mouth 2 (two) times daily. 04/13/24   Alvan Dorn FALCON, MD  JARDIANCE  25 MG TABS tablet Take 25 mg by mouth daily. 11/18/21   [provider]  labetalol  (NORMODYNE ) 200 MG tablet Take 1 tablet (200 mg total) by mouth 2 (two) times daily. 01/16/24   Alvan Dorn FALCON, MD  LANTUS  SOLOSTAR 100 UNIT/ML Solostar Pen Inject 30 Units into the skin daily. Inject 18 units in lhe morning and 50 units every evening 08/17/24   Arlon Carliss ORN, DO  NIFEdipine (ADALAT CC) 90 MG 24 hr tablet Take 90 mg by mouth daily. 08/16/24   [provider]  nitroGLYCERIN  (NITROSTAT ) 0.4 MG SL tablet Place 1 tablet (0.4 mg total) under the tongue every 5 (five) minutes as needed for chest pain. Patient taking differently: Place 0.4 mg under the tongue every 5 (five) minutes x 3 doses as needed for chest pain. 03/16/18   Hammond, Janine, NP  oxyCODONE -acetaminophen  (PERCOCET) 10-325 MG tablet Take 1 tablet by mouth 2 (two) times daily. Patient not taking: Reported on 10/01/2024    [provider]  SYMBICORT 160-4.5 MCG/ACT inhaler Inhale 2 puffs into the lungs. 09/25/24   [provider]    Allergies: Alendronate, Lisinopril , Losartan, Clonidine  and derivatives, Codeine, Hydrocodone, Hydrocodone-acetaminophen , Pioglitazone, Ramelteon, Rosuvastatin , Valsartan, and Amoxicillin-pot clavulanate    Review of Systems  Updated Vital Signs BP (!) 158/134   Pulse 65   Temp 98.2 F (36.8 C)   Resp (!) 21   Ht 1.651 m (5' 5)   Wt 77.5 kg   SpO2 91%   BMI 28.42 kg/m   Physical Exam Vitals and nursing note reviewed.  Constitutional:      General: She is  not in acute distress.    Appearance: She is well-developed.  HENT:     Head: Normocephalic and atraumatic.  Eyes:     Conjunctiva/sclera: Conjunctivae normal.  Cardiovascular:     Rate and Rhythm: Normal rate and regular rhythm.  Pulmonary:     Effort: Respiratory distress present.     Breath sounds: No stridor. Decreased breath sounds and wheezing present.  Abdominal:     General: There is no distension.  Skin:    General: Skin is warm and dry.  Neurological:     Mental Status: She is alert and oriented to person, place, and time.     Cranial Nerves: No cranial nerve deficit.  Psychiatric:        Mood and Affect: Mood normal.     (all labs ordered are listed, but only abnormal results are displayed) Labs Reviewed  COMPREHENSIVE METABOLIC PANEL WITH GFR  PRO BRAIN NATRIURETIC PEPTIDE  CBC WITH DIFFERENTIAL/PLATELET    EKG: None  Radiology: No results found.   Procedures   Medications Ordered in the ED - No data to display                                  Medical Decision Making Elderly female hypertension, CAD, diabetes as well as heart failure, now presents in respiratory distress.  Patient is reportedly hypoxic, 91% on room air, but has tachypnea, decreased breath sounds, wheezing, concern for heart failure exacerbation,/decompensation.  Patient had stat x-ray, blood work, EKG, monitoring, labs. Chart review notable for BNP earlier in the day greater than 1000. Cardiac 65 sinus normal pulse ox 91% room air borderline  Amount and/or Complexity of Data Reviewed Independent Historian: EMS External Data Reviewed: notes. Labs: ordered. Decision-making details documented in ED Course. Radiology: ordered and independent interpretation performed. Decision-making details documented in ED Course. ECG/medicine tests: ordered and independent interpretation performed. Decision-making details documented in ED Course.  Risk Prescription drug management. Decision  regarding hospitalization. Diagnosis or treatment significantly limited by social determinants of health.   Patient has improved, began to urinate, x-ray reviewed, pleural effusion, and with increased work of breathing, heart failure decompensation, patient be admitted for further monitoring, management.   Final diagnoses:  Acute on chronic systolic congestive heart failure (HCC)     Garrick Charleston, MD 10/08/24 2321  "

## 2024-10-09 ENCOUNTER — Observation Stay (HOSPITAL_COMMUNITY)

## 2024-10-09 ENCOUNTER — Encounter (HOSPITAL_COMMUNITY): Payer: Self-pay | Admitting: Internal Medicine

## 2024-10-09 DIAGNOSIS — E7800 Pure hypercholesterolemia, unspecified: Secondary | ICD-10-CM | POA: Diagnosis present

## 2024-10-09 DIAGNOSIS — Z8673 Personal history of transient ischemic attack (TIA), and cerebral infarction without residual deficits: Secondary | ICD-10-CM | POA: Diagnosis not present

## 2024-10-09 DIAGNOSIS — Z833 Family history of diabetes mellitus: Secondary | ICD-10-CM | POA: Diagnosis not present

## 2024-10-09 DIAGNOSIS — Z7902 Long term (current) use of antithrombotics/antiplatelets: Secondary | ICD-10-CM | POA: Diagnosis not present

## 2024-10-09 DIAGNOSIS — I110 Hypertensive heart disease with heart failure: Secondary | ICD-10-CM | POA: Diagnosis present

## 2024-10-09 DIAGNOSIS — D649 Anemia, unspecified: Secondary | ICD-10-CM | POA: Diagnosis present

## 2024-10-09 DIAGNOSIS — L539 Erythematous condition, unspecified: Secondary | ICD-10-CM | POA: Diagnosis present

## 2024-10-09 DIAGNOSIS — I2510 Atherosclerotic heart disease of native coronary artery without angina pectoris: Secondary | ICD-10-CM | POA: Diagnosis present

## 2024-10-09 DIAGNOSIS — Z7982 Long term (current) use of aspirin: Secondary | ICD-10-CM | POA: Diagnosis not present

## 2024-10-09 DIAGNOSIS — I5033 Acute on chronic diastolic (congestive) heart failure: Secondary | ICD-10-CM

## 2024-10-09 DIAGNOSIS — Z825 Family history of asthma and other chronic lower respiratory diseases: Secondary | ICD-10-CM | POA: Diagnosis not present

## 2024-10-09 DIAGNOSIS — J9601 Acute respiratory failure with hypoxia: Secondary | ICD-10-CM | POA: Diagnosis present

## 2024-10-09 DIAGNOSIS — I5023 Acute on chronic systolic (congestive) heart failure: Secondary | ICD-10-CM | POA: Diagnosis present

## 2024-10-09 DIAGNOSIS — Z9841 Cataract extraction status, right eye: Secondary | ICD-10-CM | POA: Diagnosis not present

## 2024-10-09 DIAGNOSIS — Z7984 Long term (current) use of oral hypoglycemic drugs: Secondary | ICD-10-CM | POA: Diagnosis not present

## 2024-10-09 DIAGNOSIS — Z7951 Long term (current) use of inhaled steroids: Secondary | ICD-10-CM | POA: Diagnosis not present

## 2024-10-09 DIAGNOSIS — J449 Chronic obstructive pulmonary disease, unspecified: Secondary | ICD-10-CM | POA: Diagnosis present

## 2024-10-09 DIAGNOSIS — Z87891 Personal history of nicotine dependence: Secondary | ICD-10-CM | POA: Diagnosis not present

## 2024-10-09 DIAGNOSIS — R799 Abnormal finding of blood chemistry, unspecified: Secondary | ICD-10-CM | POA: Diagnosis present

## 2024-10-09 DIAGNOSIS — Z9842 Cataract extraction status, left eye: Secondary | ICD-10-CM | POA: Diagnosis not present

## 2024-10-09 DIAGNOSIS — Z955 Presence of coronary angioplasty implant and graft: Secondary | ICD-10-CM | POA: Diagnosis not present

## 2024-10-09 DIAGNOSIS — E1151 Type 2 diabetes mellitus with diabetic peripheral angiopathy without gangrene: Secondary | ICD-10-CM | POA: Diagnosis present

## 2024-10-09 DIAGNOSIS — Z794 Long term (current) use of insulin: Secondary | ICD-10-CM | POA: Diagnosis not present

## 2024-10-09 DIAGNOSIS — Z8249 Family history of ischemic heart disease and other diseases of the circulatory system: Secondary | ICD-10-CM | POA: Diagnosis not present

## 2024-10-09 DIAGNOSIS — E1165 Type 2 diabetes mellitus with hyperglycemia: Secondary | ICD-10-CM | POA: Diagnosis present

## 2024-10-09 DIAGNOSIS — Z79899 Other long term (current) drug therapy: Secondary | ICD-10-CM | POA: Diagnosis not present

## 2024-10-09 LAB — GLUCOSE, CAPILLARY
Glucose-Capillary: 155 mg/dL — ABNORMAL HIGH (ref 70–99)
Glucose-Capillary: 108 mg/dL — ABNORMAL HIGH (ref 70–99)
Glucose-Capillary: 188 mg/dL — ABNORMAL HIGH (ref 70–99)
Glucose-Capillary: 205 mg/dL — ABNORMAL HIGH (ref 70–99)
Glucose-Capillary: 267 mg/dL — ABNORMAL HIGH (ref 70–99)

## 2024-10-09 LAB — COMPREHENSIVE METABOLIC PANEL WITH GFR
ALT: 7 U/L (ref 0–44)
AST: 12 U/L — ABNORMAL LOW (ref 15–41)
Albumin: 3.7 g/dL (ref 3.5–5.0)
Alkaline Phosphatase: 122 U/L (ref 38–126)
Anion gap: 6 (ref 5–15)
BUN: 21 mg/dL (ref 8–23)
CO2: 33 mmol/L — ABNORMAL HIGH (ref 22–32)
Calcium: 9 mg/dL (ref 8.9–10.3)
Chloride: 100 mmol/L (ref 98–111)
Creatinine, Ser: 0.71 mg/dL (ref 0.44–1.00)
GFR, Estimated: >60 mL/min
Glucose, Bld: 119 mg/dL — ABNORMAL HIGH (ref 70–99)
Potassium: 3.8 mmol/L (ref 3.5–5.1)
Sodium: 138 mmol/L (ref 135–145)
Total Bilirubin: 0.6 mg/dL (ref 0.0–1.2)
Total Protein: 6.7 g/dL (ref 6.5–8.1)

## 2024-10-09 LAB — CBC WITH DIFFERENTIAL/PLATELET
Abs Immature Granulocytes: 0.03 K/uL (ref 0.00–0.07)
Basophils Absolute: 0.1 K/uL (ref 0.0–0.1)
Basophils Relative: 1 %
Eosinophils Absolute: 0.5 K/uL (ref 0.0–0.5)
Eosinophils Relative: 6 %
HCT: 27 % — ABNORMAL LOW (ref 36.0–46.0)
Hemoglobin: 8.3 g/dL — ABNORMAL LOW (ref 12.0–15.0)
Immature Granulocytes: 0 %
Lymphocytes Relative: 21 %
Lymphs Abs: 1.6 K/uL (ref 0.7–4.0)
MCH: 26.8 pg (ref 26.0–34.0)
MCHC: 30.7 g/dL (ref 30.0–36.0)
MCV: 87.1 fL (ref 80.0–100.0)
Monocytes Absolute: 0.7 K/uL (ref 0.1–1.0)
Monocytes Relative: 10 %
Neutro Abs: 4.9 K/uL (ref 1.7–7.7)
Neutrophils Relative %: 62 %
Platelets: 350 K/uL (ref 150–400)
RBC: 3.1 MIL/uL — ABNORMAL LOW (ref 3.87–5.11)
RDW: 16.6 % — ABNORMAL HIGH (ref 11.5–15.5)
WBC: 7.7 K/uL (ref 4.0–10.5)
nRBC: 0 % (ref 0.0–0.2)

## 2024-10-09 LAB — PHOSPHORUS: Phosphorus: 3.3 mg/dL (ref 2.5–4.6)

## 2024-10-09 LAB — MAGNESIUM: Magnesium: 2 mg/dL (ref 1.7–2.4)

## 2024-10-09 LAB — CBG MONITORING, ED: Glucose-Capillary: 323 mg/dL — ABNORMAL HIGH (ref 70–99)

## 2024-10-09 MED ORDER — POLYETHYLENE GLYCOL 3350 17 G PO PACK: 17.0000 g | PACK | Freq: Every day | ORAL | Status: DC | PRN

## 2024-10-09 MED ORDER — MAGNESIUM OXIDE -MG SUPPLEMENT 400 (240 MG) MG PO TABS
400.0000 mg | ORAL_TABLET | ORAL | Status: AC
  Administered 2024-10-09: 400 mg via ORAL
  Filled 2024-10-09: qty 1

## 2024-10-09 MED ORDER — GABAPENTIN 100 MG PO CAPS
100.0000 mg | ORAL_CAPSULE | Freq: Every day | ORAL | Status: DC
  Administered 2024-10-09 – 2024-10-10 (×3): 100 mg via ORAL
  Filled 2024-10-09 (×3): qty 1

## 2024-10-09 MED ORDER — HYDRALAZINE HCL 20 MG/ML IJ SOLN: 10.0000 mg | Freq: Four times a day (QID) | INTRAMUSCULAR | Status: DC | PRN

## 2024-10-09 MED ORDER — OXYCODONE HCL 5 MG PO TABS
5.0000 mg | ORAL_TABLET | Freq: Once | ORAL | Status: DC
  Filled 2024-10-09: qty 1

## 2024-10-09 MED ORDER — INSULIN GLARGINE 100 UNIT/ML ~~LOC~~ SOLN
4.0000 [IU] | Freq: Two times a day (BID) | SUBCUTANEOUS | Status: DC
  Administered 2024-10-09 – 2024-10-11 (×5): 4 [IU] via SUBCUTANEOUS
  Filled 2024-10-09 (×7): qty 0.04

## 2024-10-09 MED ORDER — FLUTICASONE FUROATE-VILANTEROL 100-25 MCG/ACT IN AEPB
1.0000 | INHALATION_SPRAY | Freq: Every day | RESPIRATORY_TRACT | Status: DC
  Administered 2024-10-09 – 2024-10-11 (×3): 1 via RESPIRATORY_TRACT
  Filled 2024-10-09: qty 28

## 2024-10-09 MED ORDER — ASPIRIN 81 MG PO TBEC
81.0000 mg | DELAYED_RELEASE_TABLET | Freq: Every day | ORAL | Status: DC
  Administered 2024-10-09 – 2024-10-11 (×3): 81 mg via ORAL
  Filled 2024-10-09 (×3): qty 1

## 2024-10-09 MED ORDER — INSULIN ASPART 100 UNIT/ML IJ SOLN
0.0000 [IU] | Freq: Three times a day (TID) | INTRAMUSCULAR | Status: DC
  Administered 2024-10-09: 2 [IU] via SUBCUTANEOUS
  Administered 2024-10-09: 3 [IU] via SUBCUTANEOUS
  Administered 2024-10-10: 5 [IU] via SUBCUTANEOUS
  Administered 2024-10-10: 2 [IU] via SUBCUTANEOUS
  Administered 2024-10-10: 1 [IU] via SUBCUTANEOUS
  Administered 2024-10-11: 9 [IU] via SUBCUTANEOUS
  Administered 2024-10-11: 3 [IU] via SUBCUTANEOUS
  Filled 2024-10-09 (×5): qty 1

## 2024-10-09 MED ORDER — MELATONIN 3 MG PO TABS
6.0000 mg | ORAL_TABLET | Freq: Every evening | ORAL | Status: DC | PRN
  Administered 2024-10-09: 6 mg via ORAL
  Filled 2024-10-09: qty 2

## 2024-10-09 MED ORDER — PROCHLORPERAZINE EDISYLATE 10 MG/2ML IJ SOLN: 5.0000 mg | Freq: Four times a day (QID) | INTRAMUSCULAR | Status: DC | PRN

## 2024-10-09 MED ORDER — HYDRALAZINE HCL 50 MG PO TABS
100.0000 mg | ORAL_TABLET | Freq: Two times a day (BID) | ORAL | Status: DC
  Administered 2024-10-09 – 2024-10-11 (×5): 100 mg via ORAL
  Filled 2024-10-09 (×5): qty 2

## 2024-10-09 MED ORDER — AMLODIPINE BESYLATE 5 MG PO TABS
10.0000 mg | ORAL_TABLET | Freq: Every day | ORAL | Status: DC
  Administered 2024-10-09 – 2024-10-11 (×3): 10 mg via ORAL
  Filled 2024-10-09 (×3): qty 2

## 2024-10-09 MED ORDER — EZETIMIBE 10 MG PO TABS
10.0000 mg | ORAL_TABLET | Freq: Every day | ORAL | Status: DC
  Administered 2024-10-10 – 2024-10-11 (×2): 10 mg via ORAL
  Filled 2024-10-09 (×2): qty 1

## 2024-10-09 MED ORDER — ENOXAPARIN SODIUM 40 MG/0.4ML IJ SOSY
40.0000 mg | PREFILLED_SYRINGE | INTRAMUSCULAR | Status: DC
  Administered 2024-10-09 – 2024-10-11 (×3): 40 mg via SUBCUTANEOUS
  Filled 2024-10-09 (×2): qty 0.4

## 2024-10-09 MED ORDER — ACETAMINOPHEN 500 MG PO TABS
500.0000 mg | ORAL_TABLET | Freq: Four times a day (QID) | ORAL | Status: DC | PRN
  Administered 2024-10-10 – 2024-10-11 (×2): 500 mg via ORAL
  Filled 2024-10-09 (×2): qty 1

## 2024-10-09 MED ORDER — IPRATROPIUM-ALBUTEROL 0.5-2.5 (3) MG/3ML IN SOLN
3.0000 mL | Freq: Four times a day (QID) | RESPIRATORY_TRACT | Status: DC
  Administered 2024-10-09 – 2024-10-10 (×7): 3 mL via RESPIRATORY_TRACT
  Filled 2024-10-09 (×7): qty 3

## 2024-10-09 MED ORDER — INSULIN ASPART 100 UNIT/ML IJ SOLN
0.0000 [IU] | Freq: Every day | INTRAMUSCULAR | Status: DC
  Administered 2024-10-09: 3 [IU] via SUBCUTANEOUS
  Administered 2024-10-10: 5 [IU] via SUBCUTANEOUS
  Filled 2024-10-09 (×2): qty 1

## 2024-10-09 MED ORDER — MAGNESIUM GLUCONATE 500 (27 MG) MG PO TABS
500.0000 mg | ORAL_TABLET | ORAL | Status: DC
  Filled 2024-10-09: qty 1

## 2024-10-09 MED ORDER — CLONIDINE HCL 0.1 MG PO TABS
0.1000 mg | ORAL_TABLET | Freq: Two times a day (BID) | ORAL | Status: DC
  Administered 2024-10-09 – 2024-10-11 (×5): 0.1 mg via ORAL
  Filled 2024-10-09 (×5): qty 1

## 2024-10-09 MED ORDER — MAGNESIUM OXIDE -MG SUPPLEMENT 400 (240 MG) MG PO TABS: 800.0000 mg | ORAL_TABLET | ORAL | Status: DC

## 2024-10-09 NOTE — H&P (Addendum)
 " History and Physical  [name] [name] FMW:[id number] DOB: 03-Oct-1941 DOA: 10/08/2024  Referring physician: Dr. Raford, EDP. PCP: Medicine, [facility] Internal  Outpatient Specialists: Cardiology. Patient coming from: Home.  Chief Complaint: Shortness of breath  HPI: [name] [name] is a 84 y.o. female with medical history significant for chronic HFpEF, coronary artery disease on aspirin  and plavix , hypertension, type 2 diabetes, hyperlipidemia, peripheral vascular disease, COPD, GERD, history of prior stroke, presents to the ER via EMS with complaints of shortness of breath for 2 weeks and progressively worsening.  Associated with audible wheezing and bilateral lower extremity edema.  Denies noncompliance with home medications.  No reported subjective fevers or chills.  EMS was activated.    Upon EMS arrival, the patient was saturating 91% on room air.  Not on oxygen  supplementation at baseline.  She received albuterol  nebulizer and was placed on 3 L nasal cannula en route.  Brought into the ER for further evaluation.  In the ER, tachypneic with respiratory rate of 24.  O2 saturation 98% on 3 L nasal cannula.  Chest x-ray showing moderate left pleural effusion, loculated laterally.  Diffuse interstitial opacities.  Lab studies notable for proBNP 1669, 1527.  The patient received IV Lasix  40 mg x 1.  Due to concern for acute on chronic HFpEF, TRH, hospitalist service, was asked to admit.  ED Course: Temperature 98.2.  BP 156/60, pulse 61, respiration rate 24, O2 saturation 98% on 3 L nasal cannula.  Review of Systems: Review of systems as noted in the HPI. All other systems reviewed and are negative.   Past Medical History:  Diagnosis Date   Anxiety    Arthritis    neck (03/15/2018)   CAD (coronary artery disease)    Status post POBA distal LAD, status post Cardiolite Myoview 2008 negative for ischemia   CHF (congestive heart failure) (HCC)    COPD (chronic obstructive pulmonary disease)  (HCC)    Depression    GERD (gastroesophageal reflux disease)    Hypercholesterolemia    Hypertension    Peripheral vascular disease     status post iliac stent July 2001    Type II diabetes mellitus (HCC)    Past Surgical History:  Procedure Laterality Date   ANTERIOR CERVICAL DECOMP/DISCECTOMY FUSION  2001   BACK SURGERY     BREAST SURGERY     biopsy left breat-benign   CATARACT EXTRACTION, BILATERAL Bilateral    CHOLECYSTECTOMY OPEN     CORONARY ANGIOPLASTY     POBA distal LAD,   CORONARY STENT INTERVENTION N/A 03/15/2018   Procedure: CORONARY STENT INTERVENTION;  Surgeon: Dann Candyce RAMAN, MD;  Location: [facility] INVASIVE CV LAB;  Service: Cardiovascular;  Laterality: N/A;   DILATION AND CURETTAGE OF UTERUS  1960s   EYE SURGERY Bilateral    lasers   LEFT HEART CATH AND CORONARY ANGIOGRAPHY N/A 03/15/2018   Procedure: LEFT HEART CATH AND CORONARY ANGIOGRAPHY;  Surgeon: Dann Candyce RAMAN, MD;  Location: [facility] INVASIVE CV LAB;  Service: Cardiovascular;  Laterality: N/A;   PERIPHERAL VASCULAR INTERVENTION Right 04/2000    iliac stent    stents legs  2025   TUBAL LIGATION      Social History:  reports that she quit smoking about 29 years ago. Her smoking use included cigarettes. She started smoking about 62 years ago. She has a 20 pack-year smoking history. She has never used smokeless tobacco. She reports that she does not drink alcohol and does not use drugs.   Allergies[1]  Family History  Problem Relation Age of Onset   Heart failure Father    Heart failure Mother    Diabetes Mother    COPD Brother       Prior to Admission medications  Medication Sig Start Date End Date Taking? Authorizing Provider  albuterol  (VENTOLIN  HFA) 108 (90 Base) MCG/ACT inhaler Inhale 1-2 puffs into the lungs. 09/25/24   [provider]  amLODipine  (NORVASC ) 10 MG tablet Take 1 tablet (10 mg total) by mouth daily. 09/21/24   Pearlean Manus, MD  aspirin  EC 81 MG tablet Take 1  tablet (81 mg total) by mouth daily with breakfast. 09/20/24   Pearlean Manus, MD  cephALEXin  (KEFLEX ) 500 MG capsule Take 500 mg by mouth 4 (four) times daily.    [provider]  cloNIDine  (CATAPRES ) 0.1 MG tablet Take 0.1 mg by mouth 2 (two) times daily. 08/16/24   [provider]  clopidogrel  (PLAVIX ) 75 MG tablet take 1 tablet by mouth daily 06/15/24   Alvan Dorn FALCON, MD  ezetimibe  (ZETIA ) 10 MG tablet take 1 tablet (10 MILLIGRAM total) by mouth daily. 05/14/24   Alvan Dorn FALCON, MD  furosemide  (LASIX ) 20 MG tablet Take 1 tablet (20 mg total) by mouth 2 (two) times daily. Patient taking differently: Take 20 mg by mouth daily. 09/20/24   Pearlean Manus, MD  gabapentin  (NEURONTIN ) 100 MG capsule Take 100 mg by mouth daily.    [provider]  HUMALOG  KWIKPEN 200 UNIT/ML KwikPen Inject 16 Units into the skin with breakfast, with lunch, and with evening meal. 06/18/22   [provider]  hydrALAZINE  (APRESOLINE ) 100 MG tablet Take 100 mg by mouth 3 (three) times daily. Patient taking differently: Take 100 mg by mouth 2 (two) times daily. 08/16/24   [provider]  icosapent  Ethyl (VASCEPA ) 1 g capsule Take 1 capsule (1 g total) by mouth 2 (two) times daily. 04/13/24   Alvan Dorn FALCON, MD  JARDIANCE  25 MG TABS tablet Take 25 mg by mouth daily. 11/18/21   [provider]  labetalol  (NORMODYNE ) 200 MG tablet Take 1 tablet (200 mg total) by mouth 2 (two) times daily. 01/16/24   Alvan Dorn FALCON, MD  LANTUS  SOLOSTAR 100 UNIT/ML Solostar Pen Inject 30 Units into the skin daily. Inject 18 units in lhe morning and 50 units every evening 08/17/24   Arlon Carliss ORN, DO  NIFEdipine (ADALAT CC) 90 MG 24 hr tablet Take 90 mg by mouth daily. 08/16/24   [provider]  nitroGLYCERIN  (NITROSTAT ) 0.4 MG SL tablet Place 1 tablet (0.4 mg total) under the tongue every 5 (five) minutes as needed for chest pain. Patient taking differently: Place  0.4 mg under the tongue every 5 (five) minutes x 3 doses as needed for chest pain. 03/16/18   Hammond, Janine, NP  oxyCODONE -acetaminophen  (PERCOCET) 10-325 MG tablet Take 1 tablet by mouth 2 (two) times daily. Patient not taking: Reported on 10/01/2024    [provider]  SYMBICORT 160-4.5 MCG/ACT inhaler Inhale 2 puffs into the lungs. 09/25/24   [provider]    Physical Exam: BP (!) 156/60   Pulse 63   Temp 98.2 F (36.8 C)   Resp (!) 24   Ht 5' 5 (1.651 m)   Wt 77.5 kg   SpO2 97%   BMI 28.42 kg/m   General: 84 y.o. year-old female well developed well nourished in no acute distress.  Alert and oriented x3. Cardiovascular: Regular rate and rhythm with no rubs  or gallops.  No thyromegaly or JVD noted.  1+ pitting edema in lower extremities bilaterally. Respiratory: Diffuse rales bilaterally.  Inspiratory effort. Abdomen: Soft nontender nondistended with normal bowel sounds x4 quadrants. Muskuloskeletal: No cyanosis or clubbing noted bilaterally Neuro: CN II-XII intact, strength, sensation, reflexes Skin: No ulcerative lesions noted or rashes.  Mild LLE erythema, she states it has been there for a while and not particularly tender. Psychiatry: Judgement and insight appear normal. Mood is appropriate for condition and setting          Labs on Admission:  Basic Metabolic Panel: Recent Labs  Lab 10/08/24 1501 10/08/24 2259  NA 133* 135  K 3.8 3.8  CL 97* 98  CO2 27 28  GLUCOSE 257* 280*  BUN 24* 24*  CREATININE 0.67 0.70  CALCIUM  9.0 9.1   Liver Function Tests: Recent Labs  Lab 10/08/24 2259  AST 13*  ALT 8  ALKPHOS 132*  BILITOT 0.6  PROT 7.2  ALBUMIN 3.9   No results for input(s): LIPASE, AMYLASE in the last 168 hours. No results for input(s): AMMONIA in the last 168 hours. CBC: Recent Labs  Lab 10/08/24 1501 10/08/24 2259  WBC 8.0 9.4  NEUTROABS  --  7.1  HGB 8.7* 8.7*  HCT 27.9* 27.8*  MCV 86.9 86.9  PLT 376 373   Cardiac  Enzymes: No results for input(s): CKTOTAL, CKMB, CKMBINDEX, TROPONINI in the last 168 hours.  BNP (last 3 results) No results for input(s): BNP in the last 8760 hours.  ProBNP (last 3 results) Recent Labs    09/18/24 2150 10/08/24 1501 10/08/24 2259  PROBNP 1,871.0* 1,669.0* 1,527.0*    CBG: No results for input(s): GLUCAP in the last 168 hours.  Radiological Exams on Admission: DG Chest Port 1 View Result Date: 10/08/2024 EXAM: 1 VIEW(S) XRAY OF THE CHEST 10/08/2024 10:22:00 PM COMPARISON: 09/18/2024. CLINICAL HISTORY: sob sob FINDINGS: LUNGS AND PLEURA: Moderate left pleural effusion loculated laterally. Diffuse interstitial opacities again noted. No focal pulmonary opacity. No pneumothorax. HEART AND MEDIASTINUM: Aortic atherosclerosis. The cardiac and mediastinal silhouettes are otherwise unremarkable. BONES AND SOFT TISSUES: No acute osseous abnormality. IMPRESSION: 1. Moderate left pleural effusion, loculated laterally. 2. Diffuse interstitial opacities. Electronically signed by: Franky Crease MD 10/08/2024 10:27 PM EST RP Workstation: [id number]    EKG: I independently viewed the EKG done and my findings are as followed: Sinus rhythm rate of 63.  QTc 433.  Assessment/Plan Present on Admission:  Acute on chronic diastolic (congestive) heart failure (HCC)  Principal Problem:   Acute on chronic diastolic (congestive) heart failure (HCC)  Acute on chronic HFpEF Last 2D echo done on 09/19/2024 revealed LVEF 65 to 70% IV diuresing Monitor strict I's and O's and daily weight.  Acute hypoxic respiratory failure secondary to pulmonary edema and loculated left pleural effusion, POA Not on O2 supplementation at baseline Currently requiring 2 to 3 L to maintain O2 saturation above 92%. Home O2 evaluation prior to discharge.  Loculated left pleural effusion, moderate, POA Continue diuresing Early mobilization IR consulted for possible thoracentesis, diagnostic and  therapeutic.  Chronic normocytic anemia Hemoglobin stable at 8.7 No reported overt bleeding Repeat CBC in the morning  Type 2 diabetes with hyperglycemia Last hemoglobin A1c 7.4 on 08/14/2024 Heart healthy carb modified diet Insulin  coverage with long-acting and short acting insulin   Mild left lower extremity erythema, POA States the erythema has been there for a while Denies any significant pain Currently afebrile with no leukocytosis.  COPD Resume home regimen  Generalized weakness PT OT evaluation Fall precautions   Time: 75 minutes.   DVT prophylaxis: Subcu Lovenox  daily.  Code Status: Full code.  Family Communication: None at bedside.  Disposition Plan: Admitted to telemetry unit.  Consults called: None.  Admission status: Observation status.   Status is: Observation    Terry LOISE Hurst MD [facility] Pager [phone]  If 7PM-7AM, please contact night-coverage [email or web address] Password [facility]  10/09/2024, 12:07 AM      [1]  Allergies Allergen Reactions   Alendronate Swelling and Other (See Comments)    Leg edema   Lisinopril  Anaphylaxis, Swelling and Other (See Comments)    Tongue swelling   Losartan Swelling and Other (See Comments)    Tongue swelling   Clonidine  And Derivatives Other (See Comments)    Dizziness    Codeine Nausea And Vomiting   Hydrocodone Nausea And Vomiting   Hydrocodone-Acetaminophen  Nausea And Vomiting and Nausea Only   Pioglitazone Swelling    Swelling of ankles   Ramelteon Nausea And Vomiting and Nausea Only   Rosuvastatin  Other (See Comments)    Muscle cramps   Valsartan Nausea And Vomiting and Other (See Comments)    Dizziness   Amoxicillin-Pot Clavulanate Diarrhea   "

## 2024-10-09 NOTE — Plan of Care (Signed)
" °  Problem: Acute Rehab PT Goals(only PT should resolve) Goal: Pt Will Go Supine/Side To Sit Outcome: Progressing Flowsheets (Taken 10/09/2024 1507) Pt will go Supine/Side to Sit: Independently Goal: Patient Will Transfer Sit To/From Stand Outcome: Progressing Flowsheets (Taken 10/09/2024 1507) Patient will transfer sit to/from stand: with modified independence Goal: Pt Will Transfer Bed To Chair/Chair To Bed Outcome: Progressing Flowsheets (Taken 10/09/2024 1507) Pt will Transfer Bed to Chair/Chair to Bed: with supervision Goal: Pt Will Ambulate Outcome: Progressing Flowsheets (Taken 10/09/2024 1507) Pt will Ambulate:  with supervision  75 feet  with least restrictive assistive device    3:07 PM, 10/09/2024 Carney Saxton Powell-Butler, PT, DPT [place] with [facility]  "

## 2024-10-09 NOTE — Evaluation (Addendum)
 Physical Therapy Evaluation Patient Details Name: [name] [name] MRN: [id number] DOB: [birth year] Today's Date: 10/09/2024  History of Present Illness  [name] [name] is a 84 y.o. female with medical history significant for peripheral artery disease status post left femoral embolectomy, type 2 diabetes, hypertension, OSA, chronic HFpEF, coronary artery disease status post PCI with stent placement, history of CVA, history of cellulitis and abscess of left lower extremity, status post debridement 07/28/2024 and 08/01/2024 with LMA, discharged home with home health services from [facility] yesterday and wants to be treated here at [facility], who presents to the ER due to sudden onset shortness of breath today.     In the ER, CT angio chest was negative for pulmonary embolism.  It showed mild interstitial edema.  Moderate bilateral pleural effusions, partially loculated on the left.  Mild thoracic lymphadenopathy, chronic likely reactive.  proBNP 908.  Hypervolemic on exam.     Received IV diuretics in the ER.  EDP requesting admission for management of acute on chronic HFpEF.   Clinical Impression  Patient agreeable to PT/OT co-evaluation. Patient repots at baseline, she ambulates with AD (RW and SPC) and receives assist for ADLs from family and personal care assistant who comes Mon-Thurs, ~3 hours a day. This date, pt is mod independent to CGA/min assist for bed mobility, and CGA during transfers and ambulation with RW. Pt limited due to inc fatigue, RLE cramping, and SOB. At rest on RA, pt SpO2 measures between 89-90%. With mobility, SpO2 drops to 84% at lowest on RA but quickly recovers to 89-90%. Once pt returns to rest on 2 Lpm, SpO2 measures at 94% and above. Pt left on 2 Lpm. Nursing staff notified. Pt remains in chair at end of session, call button in reach, and all needs met. Pt reports she is already active with home health PT services. Patient will benefit from continued skilled physical therapy acutely and  in recommended venue in order to address current deficits and improve overall function.        If plan is discharge home, recommend the following: A little help with walking and/or transfers;A little help with bathing/dressing/bathroom;Assistance with cooking/housework;Assist for transportation;Help with stairs or ramp for entrance   Can travel by private vehicle        Equipment Recommendations None recommended by PT  Recommendations for Other Services       Functional Status Assessment Patient has had a recent decline in their functional status and demonstrates the ability to make significant improvements in function in a reasonable and predictable amount of time.     Precautions / Restrictions Precautions Precautions: Fall Recall of Precautions/Restrictions: Intact Restrictions Weight Bearing Restrictions Per Provider Order: No      Mobility  Bed Mobility Overal bed mobility: Needs Assistance Bed Mobility: Supine to Sit, Sit to Supine     Supine to sit: Modified independent (Device/Increase time), Supervision Sit to supine: Contact guard assist, Min assist   General bed mobility comments: pt received sitting in chair. CGA/ light min assist to get LE into bed, mod independent with supine to sit as pt was right at EOB, overall slow labored movement    Transfers Overall transfer level: Needs assistance Equipment used: Rolling walker (2 wheels) Transfers: Sit to/from Stand, Bed to chair/wheelchair/BSC Sit to Stand: Supervision, Contact guard assist   Step pivot transfers: Supervision, Contact guard assist       General transfer comment: CGA throughout for safety but overall supervision with RW, pt demo slow labored movement  during    Ambulation/Gait Ambulation/Gait assistance: Contact guard assist Gait Distance (Feet): 50 Feet Assistive device: Rolling walker (2 wheels) Gait Pattern/deviations: Step-through pattern, Decreased step length - right, Decreased step  length - left, Decreased stride length Gait velocity: Dec     General Gait Details: pt ambulates in room and halls with RW and CGA for safety, limited mostly due to inc fatigue and SOB, reports some leg pain throughout  Stairs            Wheelchair Mobility     Tilt Bed    Modified Rankin (Stroke Patients Only)       Balance Overall balance assessment: Needs assistance Sitting-balance support: No upper extremity supported, Feet supported Sitting balance-Leahy Scale: Good Sitting balance - Comments: seated at EOB   Standing balance support: Bilateral upper extremity supported, During functional activity, Reliant on assistive device for balance Standing balance-Leahy Scale: Fair Standing balance comment: fair to good with RW                             Pertinent Vitals/Pain Pain Assessment Pain Assessment: Faces Faces Pain Scale: Hurts a little bit Pain Location: R LE Pain Descriptors / Indicators: Cramping Pain Intervention(s): Limited activity within patient's tolerance, Monitored during session, Repositioned    Home Living Family/patient expects to be discharged to:: Private residence Living Arrangements: Alone Available Help at Discharge: Family;Personal care attendant;Available PRN/intermittently Type of Home: Apartment Home Access: Level entry       Home Layout: One level Home Equipment: Hospital bed;Cane - single point;Grab bars - tub/shower;Rolling Walker (2 wheels)      Prior Function Prior Level of Function : Needs assist       Physical Assist : ADLs (physical)   ADLs (physical): IADLs Mobility Comments: Household and community ambulator with RW. ADLs Comments: Independent ADL's; PCA Monday through Thursday 1:30 PM to 4PM. IADL assist from PCA.     Extremity/Trunk Assessment   Upper Extremity Assessment Upper Extremity Assessment: Defer to OT evaluation    Lower Extremity Assessment Lower Extremity Assessment: Generalized  weakness (ankle DF 4+/5 bilat, hip flexion 4-/5 bilat)    Cervical / Trunk Assessment Cervical / Trunk Assessment: Kyphotic  Communication   Communication Communication: No apparent difficulties    Cognition Arousal: Alert Behavior During Therapy: WFL for tasks assessed/performed   PT - Cognitive impairments: No apparent impairments                         Following commands: Intact       Cueing Cueing Techniques: Verbal cues     General Comments General comments (skin integrity, edema, etc.): On room air pt desaturated to a low of 84% SpO2 during ambulation but mostly hovered around 89 to 90% SpO2. Pt continually reporting that she needs OT at home and wanted it today. Pt left on 2LPM with saturation at ~94% SpO2.    Exercises     Assessment/Plan    PT Assessment Patient needs continued PT services;All further PT needs can be met in the next venue of care  PT Problem List Decreased strength;Decreased range of motion;Decreased activity tolerance;Decreased balance;Decreased mobility       PT Treatment Interventions DME instruction;Balance training;Gait training;Functional mobility training;Therapeutic activities;Therapeutic exercise;Patient/family education    PT Goals (Current goals can be found in the Care Plan section)  Acute Rehab PT Goals Patient Stated Goal: return home with HHPT PT Goal  Formulation: With patient Time For Goal Achievement: 10/12/24 Potential to Achieve Goals: Good    Frequency Min 3X/week     Co-evaluation PT/OT/SLP Co-Evaluation/Treatment: Yes Reason for Co-Treatment: To address functional/ADL transfers PT goals addressed during session: Mobility/safety with mobility OT goals addressed during session: ADL's and self-care       AM-PAC PT 6 Clicks Mobility  Outcome Measure Help needed turning from your back to your side while in a flat bed without using bedrails?: None Help needed moving from lying on your back to sitting on  the side of a flat bed without using bedrails?: A Little Help needed moving to and from a bed to a chair (including a wheelchair)?: A Little Help needed standing up from a chair using your arms (e.g., wheelchair or bedside chair)?: A Little Help needed to walk in hospital room?: A Little Help needed climbing 3-5 steps with a railing? : A Lot 6 Click Score: 18    End of Session Equipment Utilized During Treatment: Gait belt Activity Tolerance: Patient tolerated treatment well;Patient limited by fatigue Patient left: in chair;with call bell/phone within reach Nurse Communication: Mobility status PT Visit Diagnosis: Other abnormalities of gait and mobility (R26.89);Unsteadiness on feet (R26.81);Muscle weakness (generalized) (M62.81)    Time: 8997-8977 PT Time Calculation (min) (ACUTE ONLY): 20 min   Charges:   PT Evaluation $PT Eval Low Complexity: 1 Low   PT General Charges $$ ACUTE PT VISIT: 1 Visit         3:03 PM, 10/09/2024 Ebony Rickel Powell-Butler, PT, DPT [place] with [facility]

## 2024-10-09 NOTE — TOC Initial Note (Addendum)
 Transition of Care [facility]) - Initial/Assessment Note    Patient Details  Name: [name] [name] MRN: [id number] Date of Birth: 10/19/1940  Transition of Care [facility]) CM/SW Contact:    [name] [name], LCSWA Phone Number: 10/09/2024, 3:17 PM  Clinical Narrative:                   CSW spoke with patient at bedside to assess her and discuss PT recommendation for HHPT. Patient reports that she lives by herself , but has an engineer, production and nurse through [facility]. Patient expressed that the agency is not that reliable at times when she needs them to be, but that the aide takes her to the store and to her appointments. Patient reports having a walker and cane , but will need oxygen  since she does not have any at home. Afterwards, CSW reached reach out to [facility] to see about how patient could go about going to another agency that will still provide her with a nurse and aide for more hours. The lady with [facility], explained the process and gave CSW a number for patient to call to complete an intake assessment. CSW explained process and patient understood.    [facility] agency who was able to accept patient insurance was [facility] for HHPT. Patient made aware. Patient also agreed to [facility] agency to provide her oxygen .  Expected Discharge Plan: Home w Home Health Services Barriers to Discharge: Continued Medical Work up   Patient Goals and CMS Choice Patient states their goals for this hospitalization and ongoing recovery are:: return home   Choice offered to / list presented to : Patient      Expected Discharge Plan and Services In-house Referral: Clinical Social Work   Post Acute Care Choice: Durable Medical Equipment Living arrangements for the past 2 months: Apartment                 DME Arranged: Oxygen  DME Agency: [employer]       HH Arranged: PT HH Agency: [facility] Date [facility] Agency Contacted: 10/09/24 Time HH Agency Contacted: [id number] Representative spoke with at [facility]  Agency: [facility]  Prior Living Arrangements/Services Living arrangements for the past 2 months: Apartment Lives with:: Self Patient language and need for interpreter reviewed:: Yes Do you feel safe going back to the place where you live?: Yes      Need for Family Participation in Patient Care: No (Comment) Care giver support system in place?: No (comment) Current home services: DME, Home RN, Homehealth aide, Other (comment) ([facility]) Criminal Activity/Legal Involvement Pertinent to Current Situation/Hospitalization: No - Comment as needed  Activities of Daily Living   ADL Screening (condition at time of admission) Independently performs ADLs?: Yes (appropriate for developmental age) Is the patient deaf or have difficulty hearing?: No Does the patient have difficulty seeing, even when wearing glasses/contacts?: No Does the patient have difficulty concentrating, remembering, or making decisions?: No  Permission Sought/Granted      Share Information with NAME: [name] [name]     Permission granted to share info w Relationship: Patient     Emotional Assessment Appearance:: Appears stated age Attitude/Demeanor/Rapport: Engaged Affect (typically observed): Stable, Accepting, Appropriate Orientation: : Oriented to Self, Oriented to Place, Oriented to  Time, Oriented to Situation Alcohol / Substance Use: Not Applicable Psych Involvement: No (comment)  Admission diagnosis:  Elevated BUN [R79.9] Acute on chronic systolic congestive heart failure (HCC) [I50.23] Normochromic normocytic anemia [D64.9] Elevated alkaline phosphatase level [R74.8] Acute  on chronic diastolic (congestive) heart failure (HCC) [I50.33] Patient Active Problem List   Diagnosis Date Noted   Acute exacerbation of CHF (congestive heart failure) (HCC) 09/18/2024   Acute on chronic diastolic (congestive) heart failure (HCC) 08/14/2024   Hypoglycemia 01/29/2020   Atypical chest pain 01/29/2020   Ischemic  cerebrovascular accident (CVA) (HCC) 01/28/2020   Status post coronary artery stent placement    Angina pectoris 03/15/2018   Chest pain 06/17/2016   Hypertensive urgency 06/17/2016   Hyperlipidemia 10/29/2013   CAD (coronary artery disease) 10/29/2013   DM type 2 causing vascular disease (HCC) 10/29/2013   Asthma 10/29/2013   Osteopenia 10/29/2013   Insomnia 10/29/2013   Chronic constipation 10/29/2013   Mild carotid artery disease 06/09/2012   Skin lesion 06/09/2012   Facial eczema 06/09/2012   Obstructive sleep apnea 06/09/2012   Hypertension    Peripheral vascular disease    Coronary artery disease    Diabetes mellitus [facility])    PCP:  Medicine, [facility]:   [facility] - [place], [place] - [address] [place] [address] Phone: [phone] Fax: [phone]     Social Drivers of Health (SDOH) Social History: SDOH Screenings   Food Insecurity: No Food Insecurity (10/09/2024)  Housing: Low Risk (10/09/2024)  Transportation Needs: No Transportation Needs (10/09/2024)  Utilities: Not At Risk (10/09/2024)  Depression (PHQ2-9): Low Risk (06/21/2024)  Financial Resource Strain: Low Risk (10/06/2023)   Received from [facility]  Social Connections: Socially Isolated (10/09/2024)  Stress: No Stress Concern Present (08/03/2024)   Received from [facility]  Tobacco Use: Medium Risk (10/09/2024)   SDOH Interventions:     Readmission Risk Interventions    10/09/2024    3:10 PM 08/17/2024   10:13 AM  Readmission Risk Prevention Plan  Transportation Screening Complete Complete  PCP or Specialist Appt within 5-7 Days  Complete  Home Care Screening Complete Complete  Medication Review (RN CM) Complete Complete

## 2024-10-09 NOTE — Evaluation (Signed)
 Occupational Therapy Evaluation Patient Details Name: [name] [name] MRN: [id number] DOB: 06-09-1941 Today's Date: 10/09/2024   History of Present Illness   [name] [name] is a 84 y.o. female with medical history significant for peripheral artery disease status post left femoral embolectomy, type 2 diabetes, hypertension, OSA, chronic HFpEF, coronary artery disease status post PCI with stent placement, history of CVA, history of cellulitis and abscess of left lower extremity, status post debridement 07/28/2024 and 08/01/2024 with LMA, discharged home with home health services from [facility] yesterday and wants to be treated here at [facility], who presents to the ER due to sudden onset shortness of breath today.     In the ER, CT angio chest was negative for pulmonary embolism.  It showed mild interstitial edema.  Moderate bilateral pleural effusions, partially loculated on the left.  Mild thoracic lymphadenopathy, chronic likely reactive.  proBNP 908.  Hypervolemic on exam.     Received IV diuretics in the ER.  EDP requesting admission for management of acute on chronic HFpEF.     Clinical Impressions Pt agreeable to OT and PT co-evaluation. Pt reports independence at baseline for ADL's with IADL assist. Pt demonstrated labored effort but no significant physical assist for bed mobility or functional ambulation. See O2 status note below. Pt feels as though she needs supplemental O2. On room air pt mostly stayed around 89 to 90% SpO2. Pt left in the chair with call bell within reach. Pt is not recommended for any further acute OT services and will be discharged to care of nursing staff for remaining length of stay.         If plan is discharge home, recommend the following:   Assist for transportation;A little help with bathing/dressing/bathroom     Functional Status Assessment   Patient has not had a recent decline in their functional status     Equipment Recommendations   None recommended  by OT             Precautions/Restrictions   Precautions Precautions: Fall Recall of Precautions/Restrictions: Intact Restrictions Weight Bearing Restrictions Per Provider Order: No     Mobility Bed Mobility Overal bed mobility: Needs Assistance Bed Mobility: Supine to Sit, Sit to Supine     Supine to sit: Modified independent (Device/Increase time), Supervision Sit to supine: Supervision, Min assist   General bed mobility comments: Labored effort to bring B LE into bed. Pt has hospital bed.    Transfers Overall transfer level: Needs assistance Equipment used: Rolling walker (2 wheels) Transfers: Sit to/from Stand, Bed to chair/wheelchair/BSC Sit to Stand: Supervision, Contact guard assist     Step pivot transfers: Supervision     General transfer comment: Chair to EOB with RW.      Balance Overall balance assessment: Needs assistance Sitting-balance support: No upper extremity supported, Feet supported Sitting balance-Leahy Scale: Good Sitting balance - Comments: seated at EOB   Standing balance support: Bilateral upper extremity supported, During functional activity, Reliant on assistive device for balance Standing balance-Leahy Scale: Fair Standing balance comment: fair to good with RW                           ADL either performed or assessed with clinical judgement   ADL Overall ADL's : Needs assistance/impaired     Grooming: Supervision/safety;Contact guard assist;Standing   Upper Body Bathing: Modified independent;Sitting   Lower Body Bathing: Modified independent;Supervison/ safety;Sitting/lateral leans   Upper Body Dressing : Modified independent;Sitting  Lower Body Dressing: Supervision/safety;Set up;Sitting/lateral leans Lower Body Dressing Details (indicate cue type and reason): Labored effort to doff and don R sock seated in recliner. Toilet Transfer: Contact guard assist;Supervision/safety;Rolling walker (2  wheels);Ambulation Toilet Transfer Details (indicate cue type and reason): Ambulation in the room/hall with RW. Toileting- Clothing Manipulation and Hygiene: Modified independent;Supervision/safety;Sitting/lateral lean       Functional mobility during ADLs: Supervision/safety;Contact guard assist;Rolling walker (2 wheels) General ADL Comments: Pt able to ambulate in the hall with RW and CGA to supervision assist.     Vision Baseline Vision/History: 1 Wears glasses Ability to See in Adequate Light: 0 Adequate Patient Visual Report: No change from baseline Vision Assessment?: No apparent visual deficits     Perception Perception: Not tested       Praxis Praxis: Not tested       Pertinent Vitals/Pain Pain Assessment Pain Assessment: Faces Faces Pain Scale: Hurts a little bit Pain Location: R LE Pain Descriptors / Indicators: Cramping Pain Intervention(s): Monitored during session, Repositioned     Extremity/Trunk Assessment Upper Extremity Assessment Upper Extremity Assessment: Generalized weakness;Overall WFL for tasks assessed   Lower Extremity Assessment Lower Extremity Assessment: Defer to PT evaluation   Cervical / Trunk Assessment Cervical / Trunk Assessment: Kyphotic   Communication Communication Communication: No apparent difficulties   Cognition Arousal: Alert Behavior During Therapy: WFL for tasks assessed/performed Cognition: No apparent impairments                               Following commands: Intact       Cueing  General Comments   Cueing Techniques: Verbal cues  On room air pt desaturated to a low of 84% SpO2 during ambulation but mostly hovered around 89 to 90% SpO2. Pt continually reporting that she needs OT at home and wanted it today. Pt left on 2LPM with saturation at ~94% SpO2.   Exercises           Home Living Family/patient expects to be discharged to:: Private residence Living Arrangements: Alone Available Help  at Discharge: Family;Personal care attendant;Available PRN/intermittently Type of Home: Apartment Home Access: Level entry     Home Layout: One level     Bathroom Shower/Tub: Chief Strategy Officer: Standard Bathroom Accessibility: Yes   Home Equipment: Hospital bed;Cane - single point;Grab bars - tub/shower;Rolling Walker (2 wheels)          Prior Functioning/Environment Prior Level of Function : Needs assist       Physical Assist : ADLs (physical)   ADLs (physical): IADLs Mobility Comments: Household and community ambulator with RW. ADLs Comments: Independent ADL's; PCA Monday through Thursday 1:30 PM to 4PM. IADL assist from PCA.                            Co-evaluation PT/OT/SLP Co-Evaluation/Treatment: Yes Reason for Co-Treatment: To address functional/ADL transfers PT goals addressed during session: Mobility/safety with mobility OT goals addressed during session: ADL's and self-care                       End of Session Equipment Utilized During Treatment: Rolling walker (2 wheels);Oxygen  Nurse Communication: Mobility status;Other (comment) (O2 status)  Activity Tolerance: Patient tolerated treatment well Patient left: in chair;with call bell/phone within reach  OT Visit Diagnosis: Unsteadiness on feet (R26.81);Other abnormalities of gait and mobility (R26.89);Muscle weakness (generalized) (M62.81)  Time: 8997-8975 OT Time Calculation (min): 22 min Charges:  OT General Charges $OT Visit: 1 Visit OT Evaluation $OT Eval Low Complexity: 1 Low  Copelyn Widmer OT, MOT  Jayson Person 10/09/2024, 1:46 PM

## 2024-10-09 NOTE — Progress Notes (Addendum)
" °  PROGRESS NOTE    [name] [name]  FMW:[id number] DOB: 12-23-1940 DOA: 10/08/2024 PCP: Medicine, [facility]     Same Day Admission Note:   84 y.o. female with medical history significant for chronic HFpEF, coronary artery disease on aspirin  and plavix , hypertension, type 2 diabetes, hyperlipidemia, peripheral vascular disease, COPD, GERD, history of prior stroke, presents to the ER via EMS with complaints of shortness of breath for 2 weeks and progressively worsening. Chest x-ray showing moderate left pleural effusion, loculated laterally. Diffuse interstitial opacities. Lab studies notable for proBNP 1669, 1527.   She feels better. She is on 4 L oxygen . She doesn't use oxygen  at home. She lives at home by herself. She does have a nurse and PT who comes once a week. She also has an aide that comes in the evenings.  She does have left sided loculated pleural effusion and IR will be able to do thoracentesis tomorrow.  She has b/l LE edema. She follows up with Dr Ranae office.  Restarted BP meds including amlodipine , clonidine  and hydralazine . Resume plavix  after thoracentesis is done.    [name], MD [facility]  If 7PM-7AM, please contact night-coverage  10/09/2024, 8:49 AM  "

## 2024-10-10 ENCOUNTER — Encounter (HOSPITAL_COMMUNITY): Payer: Self-pay | Admitting: Internal Medicine

## 2024-10-10 ENCOUNTER — Inpatient Hospital Stay (HOSPITAL_COMMUNITY)

## 2024-10-10 ENCOUNTER — Ambulatory Visit

## 2024-10-10 ENCOUNTER — Observation Stay (HOSPITAL_COMMUNITY)

## 2024-10-10 DIAGNOSIS — I5033 Acute on chronic diastolic (congestive) heart failure: Secondary | ICD-10-CM | POA: Diagnosis not present

## 2024-10-10 LAB — GLUCOSE, CAPILLARY
Glucose-Capillary: 133 mg/dL — ABNORMAL HIGH (ref 70–99)
Glucose-Capillary: 184 mg/dL — ABNORMAL HIGH (ref 70–99)
Glucose-Capillary: 293 mg/dL — ABNORMAL HIGH (ref 70–99)
Glucose-Capillary: 407 mg/dL — ABNORMAL HIGH (ref 70–99)

## 2024-10-10 LAB — BODY FLUID CELL COUNT WITH DIFFERENTIAL
Eos, Fluid: 1 %
Lymphs, Fluid: 89 %
Monocyte-Macrophage-Serous Fluid: 8 % — ABNORMAL LOW (ref 50–90)
Neutrophil Count, Fluid: 2 % (ref 0–25)
Total Nucleated Cell Count, Fluid: 483 uL (ref 0–1000)

## 2024-10-10 LAB — GRAM STAIN

## 2024-10-10 LAB — GLUCOSE, RANDOM: Glucose, Bld: 381 mg/dL — ABNORMAL HIGH (ref 70–99)

## 2024-10-10 MED ORDER — LIDOCAINE HCL (PF) 2 % IJ SOLN
10.0000 mL | Freq: Once | INTRAMUSCULAR | Status: AC
  Administered 2024-10-10: 10 mL
  Filled 2024-10-10: qty 10

## 2024-10-10 MED ORDER — LIDOCAINE HCL (PF) 2 % IJ SOLN
INTRAMUSCULAR | Status: AC
  Filled 2024-10-10: qty 10

## 2024-10-10 MED ORDER — IPRATROPIUM-ALBUTEROL 0.5-2.5 (3) MG/3ML IN SOLN
3.0000 mL | Freq: Two times a day (BID) | RESPIRATORY_TRACT | Status: DC
  Administered 2024-10-11: 3 mL via RESPIRATORY_TRACT

## 2024-10-10 MED ORDER — FUROSEMIDE 40 MG PO TABS
40.0000 mg | ORAL_TABLET | Freq: Every day | ORAL | Status: DC
  Administered 2024-10-10 – 2024-10-11 (×2): 40 mg via ORAL
  Filled 2024-10-10 (×2): qty 1

## 2024-10-10 NOTE — Procedures (Addendum)
 Ultrasound-guided diagnostic and therapeutic left sided thoracentesis performed yielding 125 ml of straw colored colored fluid. No immediate complications.   Diagnostic fluid was sent to the lab for further analysis. Follow-up chest x-ray pending. EBL is < 2 ml.

## 2024-10-10 NOTE — Plan of Care (Signed)

## 2024-10-10 NOTE — Progress Notes (Signed)
 Patient tolerated left Thoracentesis procedure well today and 150 mL of clear yellow pleural fluid removed and sent to lab for testing. Patient verbalized understanding of post procedure instructions and transported via stretcher to xray at this time for post chest xray with no acute distress noted.

## 2024-10-10 NOTE — Progress Notes (Signed)
 " PROGRESS NOTE    [name] [name]  FMW:[id number] DOB: 07-05-1941 DOA: 10/08/2024 PCP: [facility], [facility]   Brief Narrative:   84 y.o. female with medical history significant for chronic HFpEF, coronary artery disease on aspirin  and plavix , hypertension, type 2 diabetes, hyperlipidemia, peripheral vascular disease, COPD, GERD, history of prior stroke, presents to the ER via EMS with complaints of shortness of breath for 2 weeks and progressively worsening. Chest x-ray showing moderate left pleural effusion, loculated laterally. Diffuse interstitial opacities. Lab studies notable for proBNP 1669, 1527.   She does have left sided loculated pleural effusion and IR will do thoracentesis on 1/7.  She is from home and has an engineer, production and nurse to help      Assessment & Plan:  Principal Problem:   Acute on chronic diastolic (congestive) heart failure (HCC)   Acute on chronic HFpEF,POA: NYHA II Last 2D echo done on 09/19/2024 revealed LVEF 65 to 70% Received IV lasix . Started on oral lasix  40 mg po daily on 1/7. Monitor strict I's and O's and daily weight. Heart healthy diet   Acute hypoxic respiratory failure secondary to pulmonary edema and loculated left pleural effusion, POA Not on O2 supplementation at baseline Currently requiring 2 L to maintain O2 saturation above 92%. Home O2 evaluation prior to discharge.   Loculated left pleural effusion, moderate, POA Continue diuresing Early mobilization IR consulted for thoracentesis, to be done today.   Chronic normocytic anemia Hemoglobin stable No reported overt bleeding Repeat CBC as needed   Type 2 diabetes with hyperglycemia Last hemoglobin A1c 7.4 on 08/14/2024 Heart healthy carb modified diet Insulin  coverage with long-acting and short acting insulin    Mild left lower extremity erythema, POA States the erythema has been there for a while Denies any significant pain Currently afebrile with no leukocytosis.   COPD Resumed  home regimen   Generalized weakness PT OT evaluation Fall precautions  Disposition: Lives at home and has an engineer, production and nurse through regional home care.     DVT prophylaxis: enoxaparin  (LOVENOX ) injection 40 mg Start: 10/09/24 1000     Code Status: Full Code Family Communication:  None at the bedside Status is: Inpatient Remains inpatient appropriate because: ARF with hypoxia    Subjective:  She is on 2 L oxygen . Awaiting US  guided left sided thoracentesis.   Examination:  General exam: Appears calm and comfortable , 2 L nasal oxygen  in place Respiratory system: Slightly reduced breath sounds over left hemithorax. Respiratory effort normal. Cardiovascular system: S1 & S2 heard, RRR. No JVD, murmurs, rubs, gallops or clicks. No pedal edema. Gastrointestinal system: Abdomen is nondistended, soft and nontender. No organomegaly or masses felt. Normal bowel sounds heard. Central nervous system: Alert and oriented. No focal neurological deficits. Extremities: Symmetric 5 x 5 power. Skin: No rashes, lesions or ulcers Psychiatry: Judgement and insight appear normal. Mood & affect appropriate.     Diet Orders (From admission, onward)     Start     Ordered   10/09/24 0407  Diet heart healthy/carb modified Room service appropriate? Yes; Fluid consistency: Thin  Diet effective now       Question Answer Comment  Diet-HS Snack? Nothing   Room service appropriate? Yes   Fluid consistency: Thin      10/09/24 0407            Objective: Vitals:   10/10/24 0149 10/10/24 0416 10/10/24 0710 10/10/24 0711  BP:  (!) 162/51    Pulse:  (!) 58  Resp:      Temp:  98.2 F (36.8 C)    TempSrc:  Oral    SpO2: 93% 93% 95% 95%  Weight:      Height:        Intake/Output Summary (Last 24 hours) at 10/10/2024 0841 Last data filed at 10/10/2024 0419 Gross per 24 hour  Intake 120 ml  Output 800 ml  Net -680 ml   Filed Weights   10/08/24 2212 10/09/24 0043  Weight: 77.5 kg 78.1  kg    Scheduled Meds:  amLODipine   10 mg Oral Daily   aspirin  EC  81 mg Oral Q breakfast   cloNIDine   0.1 mg Oral BID   enoxaparin  (LOVENOX ) injection  40 mg Subcutaneous Q24H   ezetimibe   10 mg Oral Daily   fluticasone  furoate-vilanterol  1 puff Inhalation Daily   gabapentin   100 mg Oral QHS   hydrALAZINE   100 mg Oral BID   insulin  aspart  0-5 Units Subcutaneous QHS   insulin  aspart  0-9 Units Subcutaneous TID WC   insulin  glargine  4 Units Subcutaneous BID   ipratropium-albuterol   3 mL Nebulization Q6H   oxyCODONE   5 mg Oral Once   Continuous Infusions:  Nutritional status     Body mass index is 28.65 kg/m.  Data Reviewed:   CBC: Recent Labs  Lab 10/08/24 1501 10/08/24 2259 10/09/24 0502  WBC 8.0 9.4 7.7  NEUTROABS  --  7.1 4.9  HGB 8.7* 8.7* 8.3*  HCT 27.9* 27.8* 27.0*  MCV 86.9 86.9 87.1  PLT 376 373 350   Basic Metabolic Panel: Recent Labs  Lab 10/08/24 1501 10/08/24 2259 10/09/24 0502  NA 133* 135 138  K 3.8 3.8 3.8  CL 97* 98 100  CO2 27 28 33*  GLUCOSE 257* 280* 119*  BUN 24* 24* 21  CREATININE 0.67 0.70 0.71  CALCIUM  9.0 9.1 9.0  MG  --   --  2.0  PHOS  --   --  3.3   GFR: Estimated Creatinine Clearance: 56 mL/min (by C-G formula based on SCr of 0.71 mg/dL). Liver Function Tests: Recent Labs  Lab 10/08/24 2259 10/09/24 0502  AST 13* 12*  ALT 8 7  ALKPHOS 132* 122  BILITOT 0.6 0.6  PROT 7.2 6.7  ALBUMIN 3.9 3.7   No results for input(s): LIPASE, AMYLASE in the last 168 hours. No results for input(s): AMMONIA in the last 168 hours. Coagulation Profile: No results for input(s): INR, PROTIME in the last 168 hours. Cardiac Enzymes: No results for input(s): CKTOTAL, CKMB, CKMBINDEX, TROPONINI in the last 168 hours. BNP (last 3 results) Recent Labs    09/18/24 2150 10/08/24 1501 10/08/24 2259  PROBNP 1,871.0* 1,669.0* 1,527.0*   HbA1C: No results for input(s): HGBA1C in the last 72 hours. CBG: Recent Labs   Lab 10/09/24 0714 10/09/24 1131 10/09/24 1609 10/09/24 1917 10/10/24 0731  GLUCAP 108* 188* 205* 267* 133*   Lipid Profile: No results for input(s): CHOL, HDL, LDLCALC, TRIG, CHOLHDL, LDLDIRECT in the last 72 hours. Thyroid  Function Tests: No results for input(s): TSH, T4TOTAL, FREET4, T3FREE, THYROIDAB in the last 72 hours. Anemia Panel: No results for input(s): VITAMINB12, FOLATE, FERRITIN, TIBC, IRON, RETICCTPCT in the last 72 hours. Sepsis Labs: No results for input(s): PROCALCITON, LATICACIDVEN in the last 168 hours.  No results found for this or any previous visit (from the past 240 hours).       Radiology Studies: DG Chest Port 1 View Result Date: 10/08/2024 EXAM:  1 VIEW(S) XRAY OF THE CHEST 10/08/2024 10:22:00 PM COMPARISON: 09/18/2024. CLINICAL HISTORY: sob sob FINDINGS: LUNGS AND PLEURA: Moderate left pleural effusion loculated laterally. Diffuse interstitial opacities again noted. No focal pulmonary opacity. No pneumothorax. HEART AND MEDIASTINUM: Aortic atherosclerosis. The cardiac and mediastinal silhouettes are otherwise unremarkable. BONES AND SOFT TISSUES: No acute osseous abnormality. IMPRESSION: 1. Moderate left pleural effusion, loculated laterally. 2. Diffuse interstitial opacities. Electronically signed by: Franky Crease MD 10/08/2024 10:27 PM EST RP Workstation: [id number]           LOS: 1 day   Time spent= 35 mins    [name], MD [facility]  If 7PM-7AM, please contact night-coverage  10/10/2024, 8:41 AM  "

## 2024-10-11 ENCOUNTER — Telehealth: Payer: Self-pay | Admitting: Cardiology

## 2024-10-11 DIAGNOSIS — I5033 Acute on chronic diastolic (congestive) heart failure: Secondary | ICD-10-CM | POA: Diagnosis not present

## 2024-10-11 LAB — GLUCOSE, CAPILLARY
Glucose-Capillary: 193 mg/dL — ABNORMAL HIGH (ref 70–99)
Glucose-Capillary: 223 mg/dL — ABNORMAL HIGH (ref 70–99)
Glucose-Capillary: 357 mg/dL — ABNORMAL HIGH (ref 70–99)

## 2024-10-11 LAB — LD, BODY FLUID (OTHER): LD, Body Fluid: 58 IU/L

## 2024-10-11 MED ORDER — FUROSEMIDE 20 MG PO TABS: 20.0000 mg | ORAL_TABLET | Freq: Every day | ORAL | Status: AC

## 2024-10-11 MED ORDER — HYDRALAZINE HCL 100 MG PO TABS: 100.0000 mg | ORAL_TABLET | Freq: Two times a day (BID) | ORAL | Status: AC

## 2024-10-11 MED ORDER — DOXYCYCLINE HYCLATE 100 MG PO TABS: 100.0000 mg | ORAL_TABLET | Freq: Two times a day (BID) | ORAL | 0 refills | Status: AC

## 2024-10-11 MED ORDER — LANTUS SOLOSTAR 100 UNIT/ML ~~LOC~~ SOPN: 18.0000 [IU] | PEN_INJECTOR | Freq: Two times a day (BID) | SUBCUTANEOUS | Status: AC

## 2024-10-11 MED ORDER — NITROGLYCERIN 0.4 MG SL SUBL: 0.4000 mg | SUBLINGUAL_TABLET | SUBLINGUAL | Status: AC | PRN

## 2024-10-11 NOTE — Discharge Instructions (Signed)
 [name] [name]

## 2024-10-11 NOTE — TOC Transition Note (Signed)
 Transition of Care [facility]) - Discharge Note   Patient Details  Name: [name] [name] MRN: [id number] Date of Birth: 1940/12/02  Transition of Care [facility]) CM/SW Contact:  Lucie Lunger, LCSWA Phone Number: 10/11/2024, 12:53 PM   Clinical Narrative:    CSW updated that pt qualifies for home O2. Zack with [employer] updated, orders accepted and portable tank delivered to hospital room. HH PT/OT arranged with [facility]. CSW updated Darleene with [facility] of plan for D/C, orders in. TOC signing off.   Final next level of care: Home w Home Health Services Barriers to Discharge: Barriers Resolved   Patient Goals and CMS Choice Patient states their goals for this hospitalization and ongoing recovery are:: return home CMS Medicare.gov Compare Post Acute Care list provided to:: Patient Choice offered to / list presented to : Patient      Discharge Placement                       Discharge Plan and Services Additional resources added to the After Visit Summary for   In-house Referral: Clinical Social Work   Post Acute Care Choice: Durable Medical Equipment          DME Arranged: Oxygen  DME Agency: [employer] Date DME Agency Contacted: 10/11/24   Representative spoke with at DME Agency: Zack HH Arranged: PT, OT HH Agency: [facility] Date [facility] Agency Contacted: 10/11/24 Time HH Agency Contacted: 1516 Representative spoke with at [facility] Agency: Darleene  Social Drivers of Health (SDOH) Interventions SDOH Screenings   Food Insecurity: No Food Insecurity (10/09/2024)  Housing: Low Risk (10/09/2024)  Transportation Needs: No Transportation Needs (10/09/2024)  Utilities: Not At Risk (10/09/2024)  Depression (PHQ2-9): Low Risk (06/21/2024)  Financial Resource Strain: Low Risk (10/06/2023)   Received from [facility]  Social Connections: Socially Isolated (10/09/2024)  Stress: No Stress Concern Present (08/03/2024)   Received from [facility]  Tobacco Use: Medium Risk (10/10/2024)      Readmission Risk Interventions    10/09/2024    3:10 PM 08/17/2024   10:13 AM  Readmission Risk Prevention Plan  Transportation Screening Complete Complete  PCP or Specialist Appt within 5-7 Days  Complete  Home Care Screening Complete Complete  Medication Review (RN CM) Complete Complete

## 2024-10-11 NOTE — Discharge Summary (Signed)
 " Physician Discharge Summary   Patient: [name] [name] MRN: [id number] DOB: March 31, 1941  Admit date:     10/08/2024  Discharge date: 10/11/2024  Discharge Physician: [name]   PCP: Medicine, [facility]   Recommendations at discharge:    F/u with your PCP in one week. F/u with cardiology in a month. You need 2 L oxygen  on exertion only.  Discharge Diagnoses: Principal Problem:   Acute on chronic diastolic (congestive) heart failure [facility])    Hospital Course: 84 y.o. female with medical history significant for chronic HFpEF, coronary artery disease on aspirin  and plavix , hypertension, type 2 diabetes, hyperlipidemia, peripheral vascular disease, COPD, GERD, history of prior stroke, presents to the ER via EMS with complaints of shortness of breath for 2 weeks and progressively worsening. Chest x-ray showing moderate left pleural effusion, loculated laterally. Diffuse interstitial opacities. Lab studies notable for proBNP 1669, 1527.   Acute on chronic HFpEF,POA: NYHA II. Resolved Last 2D echo done on 09/19/2024 revealed LVEF 65 to 70% Received IV lasix . Started on oral lasix  daily on 1/7. Monitor strict I's and O's and daily weight. Heart healthy/low salt diet   Acute hypoxic respiratory failure secondary to pulmonary edema and loculated left pleural effusion, POA Not on O2 supplementation at baseline Walk test done on 1/8 and patient was needing 2 L oxygen  on exertion only. Home oxygen  arranged prior to her discharge.   Loculated left pleural effusion, moderate, POA Continue lasix  IR consulted for thoracentesis. S/p Ultrasound-guided diagnostic and therapeutic left sided thoracentesis performed yielding 125 ml of straw colored colored fluid, done on 10/10/24. No immediate complications.    Chronic normocytic anemia Hemoglobin stable No reported overt bleeding    Type 2 diabetes with hyperglycemia Last hemoglobin A1c 7.4 on 08/14/2024 Heart healthy carb modified  diet Continue with home diabetic regimen.   Mild left lower extremity erythema, POA States the erythema has been there for a while Denies any significant pain Currently afebrile with no leukocytosis. Ordered oral doxycycline  on discharge x 5 days   COPD Resumed home regimen   Generalized weakness PT OT evaluations done. HHPT arranged. Fall precautions   Disposition: Lives at home and has an engineer, production and nurse through regional home care. HHPT arranged.        Consultants: IR Procedures performed: Ultrasound-guided diagnostic and therapeutic left sided thoracentesis performed yielding 125 ml of straw colored colored fluid done on 1/7  Disposition: Home health Diet recommendation:  Cardiac and Carb modified diet DISCHARGE MEDICATION: Allergies as of 10/11/2024       Reactions   Alendronate Swelling, Other (See Comments)   Leg edema   Lisinopril  Anaphylaxis, Swelling, Other (See Comments)   Tongue swelling   Losartan Swelling, Other (See Comments)   Tongue swelling   Clonidine  And Derivatives Other (See Comments)   Dizziness    Codeine Nausea And Vomiting   Hydrocodone Nausea And Vomiting   Hydrocodone-acetaminophen  Nausea And Vomiting, Nausea Only   Pioglitazone Swelling   Swelling of ankles   Ramelteon Nausea And Vomiting, Nausea Only   Rosuvastatin  Other (See Comments)   Muscle cramps   Valsartan Nausea And Vomiting, Other (See Comments)   Dizziness   Amoxicillin-pot Clavulanate Diarrhea        Medication List     STOP taking these medications    labetalol  200 MG tablet Commonly known as: NORMODYNE        TAKE these medications    albuterol  108 (90 Base) MCG/ACT inhaler Commonly known as: VENTOLIN  HFA  Inhale 1-2 puffs into the lungs every 6 (six) hours as needed for shortness of breath or wheezing.   amLODipine  10 MG tablet Commonly known as: NORVASC  Take 1 tablet (10 mg total) by mouth daily.   aspirin  EC 81 MG tablet Take 1 tablet (81 mg total)  by mouth daily with breakfast.   cloNIDine  0.1 MG tablet Commonly known as: CATAPRES  Take 0.1 mg by mouth 2 (two) times daily.   clopidogrel  75 MG tablet Commonly known as: PLAVIX  take 1 tablet by mouth daily   doxycycline  100 MG tablet Commonly known as: VIBRA -TABS Take 1 tablet (100 mg total) by mouth 2 (two) times daily.   ezetimibe  10 MG tablet Commonly known as: ZETIA  take 1 tablet (10 MILLIGRAM total) by mouth daily.   furosemide  20 MG tablet Commonly known as: LASIX  Take 1 tablet (20 mg total) by mouth daily.   gabapentin  100 MG capsule Commonly known as: NEURONTIN  Take 100 mg by mouth daily.   HumaLOG  KwikPen 200 UNIT/ML KwikPen Generic drug: insulin  lispro Inject 16 Units into the skin with breakfast, with lunch, and with evening meal.   hydrALAZINE  100 MG tablet Commonly known as: APRESOLINE  Take 1 tablet (100 mg total) by mouth 2 (two) times daily.   icosapent  Ethyl 1 g capsule Commonly known as: Vascepa  Take 1 capsule (1 g total) by mouth 2 (two) times daily.   Jardiance  25 MG Tabs tablet Generic drug: empagliflozin  Take 25 mg by mouth daily.   Lantus  SoloStar 100 UNIT/ML Solostar Pen Generic drug: insulin  glargine Inject 18-50 Units into the skin 2 (two) times daily. Inject 18 units in lhe morning and 50 units every evening   nitroGLYCERIN  0.4 MG SL tablet Commonly known as: NITROSTAT  Place 1 tablet (0.4 mg total) under the tongue every 5 (five) minutes x 3 doses as needed for chest pain.   oxyCODONE -acetaminophen  10-325 MG tablet Commonly known as: PERCOCET Take 1 tablet by mouth every 8 (eight) hours as needed for pain.   Symbicort 160-4.5 MCG/ACT inhaler Generic drug: budesonide-formoterol  Inhale 2 puffs into the lungs.               Durable Medical Equipment  (From admission, onward)           Start     Ordered   10/09/24 1424  For home use only DME oxygen   Once       Question Answer Comment  Length of Need 6 Months   Mode  or (Route) Nasal cannula   Liters per Minute 2   Frequency Continuous (stationary and portable oxygen  unit needed)   Oxygen  delivery system: Gas      10/09/24 1423            Contact information for follow-up providers     Medicine, [facility] Internal. Schedule an appointment as soon as possible for a visit in 1 week(s).   Specialty: Internal Medicine Contact information: [address] [place] [address] [phone]         Miriam Norris, NP. Schedule an appointment as soon as possible for a visit in 1 month(s).   Specialty: Cardiology Contact information: [address] [place] [address] [phone]              Contact information for after-discharge care     Home Medical Care     [facility]) .   Service: Home Health Services Contact information: [address] [place]  [place] [address] [phone]                    Discharge Exam: Filed Weights   10/08/24 2212 10/09/24 0043  Weight: 77.5 kg 78.1 kg   Constitutional: NAD, calm, comfortable Eyes: PERRL, lids and conjunctivae normal ENMT: Mucous membranes are moist. Posterior pharynx clear of any exudate or lesions.Normal dentition.  Neck: normal, supple, no masses, no thyromegaly Respiratory: clear to auscultation bilaterally, no wheezing, no crackles. Normal respiratory effort. No accessory muscle use.  Cardiovascular: Regular rate and rhythm, no murmurs / rubs / gallops. No extremity edema. 2+ pedal pulses. No carotid bruits.  Abdomen: no tenderness, no masses palpated. No hepatosplenomegaly. Bowel sounds positive.  Musculoskeletal: no clubbing / cyanosis. No joint deformity upper and lower extremities. Good ROM, no contractures. Normal muscle tone.  Skin: no rashes, lesions, ulcers. No induration Neurologic: CN 2-12 grossly intact. Sensation intact, DTR normal. Strength 5/5 x all 4 extremities.  Psychiatric: Normal judgment and insight.  Alert and oriented x 3. Normal mood.    Condition at discharge: good  The results of significant diagnostics from this hospitalization (including imaging, microbiology, ancillary and laboratory) are listed below for reference.   Imaging Studies: DG Chest 1 View Result Date: 10/10/2024 CLINICAL DATA:  84 year old female status post left thoracentesis. EXAM: CHEST  1 VIEW COMPARISON:  None Available. FINDINGS: Unchanged mediastinum. Similar appearing coarse aortic arch calcifications. Bibasilar opacities. Decreased but persistent blunting of the left costophrenic angle. No evidence of pneumothorax. No new focal consolidations. Unchanged appearance of multifocal right-sided healed posterior rib fractures. Unchanged cervical fusion hardware. IMPRESSION: 1. Decreased but persistent small left pleural effusion. No evidence of pneumothorax. 2. Bibasilar atelectasis. 3.  Aortic Atherosclerosis (ICD10-I70.0). Electronically Signed   By: Ester Sides M.D.   On: 10/10/2024 09:48   US  THORACENTESIS ASP PLEURAL SPACE W/IMG GUIDE Result Date: 10/10/2024 INDICATION: 84 year old female. History of heart failure. Found to have left-sided pleural effusion. Request is for therapeutic and diagnostic left-sided thoracentesis. EXAM: ULTRASOUND GUIDED THERAPEUTIC AND DIAGNOSTIC LEFT-SIDED THORACENTESIS MEDICATIONS: Lidocaine  1% 10 mL COMPLICATIONS: None immediate. PROCEDURE: An ultrasound guided thoracentesis was thoroughly discussed with the patient and questions answered. The benefits, risks, alternatives and complications were also discussed. The patient understands and wishes to proceed with the procedure. Written consent was obtained. Ultrasound was performed to localize and mark an adequate pocket of fluid in the left chest. The area was then prepped and draped in the normal sterile fashion. 1% Lidocaine  was used for local anesthesia. Under ultrasound guidance a 6 Fr Safe-T-Centesis catheter was introduced. Thoracentesis  was performed. The catheter was removed and a dressing applied. FINDINGS: A total of approximately 125 mL of straw-colored fluid was removed. Samples were sent to the laboratory as requested by the clinical team. IMPRESSION: Successful ultrasound guided therapeutic and diagnostic left-sided thoracentesis yielding 125 mL of straw-colored pleural fluid. Performed by Delon Beagle NP Electronically Signed   By: Ester Sides M.D.   On: 10/10/2024 09:45   DG Chest Port 1 View Result Date: 10/08/2024 EXAM: 1 VIEW(S) XRAY OF THE CHEST 10/08/2024 10:22:00 PM COMPARISON: 09/18/2024. CLINICAL HISTORY: sob sob FINDINGS: LUNGS AND PLEURA: Moderate left pleural effusion loculated laterally. Diffuse interstitial opacities again noted. No focal pulmonary opacity. No pneumothorax. HEART AND MEDIASTINUM: Aortic atherosclerosis. The cardiac and mediastinal silhouettes are otherwise unremarkable. BONES AND SOFT TISSUES: No acute osseous abnormality. IMPRESSION: 1. Moderate left pleural effusion, loculated laterally. 2. Diffuse interstitial opacities. Electronically signed by: Franky Crease MD 10/08/2024 10:27  PM EST RP Workstation: [id number]   ECHOCARDIOGRAM LIMITED Result Date: 09/19/2024    ECHOCARDIOGRAM LIMITED REPORT   Patient Name:   CAIDYN BLOSSOM Date of Exam: 09/19/2024 Medical Rec #:  [id number]       Height:       65.0 in Accession #:    [id number]      Weight:       171.5 lb Date of Birth:  24-Nov-1940       BSA:          1.853 m Patient Age:    83 years        BP:           169/45 mmHg Patient Gender: F               HR:           65 bpm. Exam Location:  [facility] Procedure: Limited Echo (Both Spectral and Color Flow Doppler were utilized            during procedure). Indications:    CHF-Acute Diastolic I50.31  History:        Patient has prior history of Echocardiogram examinations, most                 recent 08/15/2024. CHF, CAD, COPD and Stroke; Risk                 Factors:Hypertension, Diabetes and  Dyslipidemia.  Sonographer:    Aida Pizza RCS Referring Phys: [id number] OLADAPO ADEFESO IMPRESSIONS  1. Limited study.  2. Left ventricular ejection fraction, by estimation, is 65 to 70%. The left ventricle has normal function. The left ventricle has no regional wall motion abnormalities. There is mild concentric left ventricular hypertrophy.  3. Right ventricular systolic function is normal. The right ventricular size is normal.  4. The mitral valve is degenerative.  5. The aortic valve is tricuspid. There is mild calcification of the aortic valve.  6. The inferior vena cava is normal in size with greater than 50% respiratory variability, suggesting right atrial pressure of 3 mmHg. FINDINGS  Left Ventricle: Left ventricular ejection fraction, by estimation, is 65 to 70%. The left ventricle has normal function. The left ventricle has no regional wall motion abnormalities. The left ventricular internal cavity size was normal in size. There is  mild concentric left ventricular hypertrophy. Right Ventricle: The right ventricular size is normal. No increase in right ventricular wall thickness. Right ventricular systolic function is normal. Pericardium: Trivial pericardial effusion is present. The pericardial effusion is posterior to the left ventricle. Presence of epicardial fat layer. Mitral Valve: The mitral valve is degenerative in appearance. Aortic Valve: The aortic valve is tricuspid. There is mild calcification of the aortic valve. There is mild aortic valve annular calcification. Aorta: The aortic root is normal in size and structure. Venous: The inferior vena cava is normal in size with greater than 50% respiratory variability, suggesting right atrial pressure of 3 mmHg. LEFT VENTRICLE PLAX 2D LVIDd:         4.00 cm LVIDs:         2.20 cm LV PW:         1.20 cm LV IVS:        1.20 cm  LV Volumes (MOD) LV vol d, MOD A2C: 97.9 ml LV vol d, MOD A4C: 94.8 ml LV vol s, MOD A2C: 28.7 ml LV vol s, MOD A4C: 34.2 ml LV  SV MOD A2C:  69.2 ml LV SV MOD A4C:     94.8 ml LV SV MOD BP:      65.7 ml LEFT ATRIUM         Index LA diam:    3.80 cm 2.05 cm/m   AORTA Ao Root diam: 3.10 cm Jayson Sierras MD Electronically signed by Jayson Sierras MD Signature Date/Time: 09/19/2024/4:31:42 PM    Final    DG Chest Port 1 View Result Date: 09/18/2024 EXAM: 1 VIEW(S) XRAY OF THE CHEST 09/18/2024 10:06:00 PM COMPARISON: 08/14/2024 cxr, ct chest 08/14/24 CLINICAL HISTORY: SOB. FINDINGS: LUNGS AND PLEURA: Diffuse interstitial opacities. Small left and trace right pleural effusions, decreased from prior exam. No pneumothorax. HEART AND MEDIASTINUM: Unchanged cardiomediastinal silhouette. Aortic arch calcifications. BONES AND SOFT TISSUES: Cervical spine surgical hardware noted. Old healed bilateral rib fractures. IMPRESSION: 1. Diffuse interstitial opacities. 2. Slight interval decrease of small left and trace right pleural effusions. Electronically signed by: Kate Plummer MD 09/18/2024 10:21 PM EST RP Workstation: [id number]    Microbiology: Results for orders placed or performed during the hospital encounter of 10/08/24  Gram stain     Status: None   Collection Time: 10/10/24  9:20 AM   Specimen: Pleura  Result Value Ref Range Status   Specimen Description PLEURAL LEFT  Final   Special Requests PLEURAL LEFT  Final   Gram Stain   Final    WBC PRESENT,BOTH PMN AND MONONUCLEAR NO ORGANISMS SEEN CYTOSPIN SMEAR Performed at [facility], [address] [place], [place] [address]    Report Status 10/10/2024 FINAL  Final  Culture, body fluid w Gram Stain-bottle     Status: None (Preliminary result)   Collection Time: 10/10/24  9:20 AM   Specimen: Pleura  Result Value Ref Range Status   Specimen Description PLEURAL LEFT  Final   Special Requests   Final    BOTTLES DRAWN AEROBIC AND ANAEROBIC Blood Culture adequate volume   Culture   Final    NO GROWTH < 24 HOURS Performed at [facility], [address],  [place], [place] [address]    Report Status PENDING  Incomplete    Labs: CBC: Recent Labs  Lab 10/08/24 1501 10/08/24 2259 10/09/24 0502  WBC 8.0 9.4 7.7  NEUTROABS  --  7.1 4.9  HGB 8.7* 8.7* 8.3*  HCT 27.9* 27.8* 27.0*  MCV 86.9 86.9 87.1  PLT 376 373 350   Basic Metabolic Panel: Recent Labs  Lab 10/08/24 1501 10/08/24 2259 10/09/24 0502 10/10/24 2122  NA 133* 135 138  --   K 3.8 3.8 3.8  --   CL 97* 98 100  --   CO2 27 28 33*  --   GLUCOSE 257* 280* 119* 381*  BUN 24* 24* 21  --   CREATININE 0.67 0.70 0.71  --   CALCIUM  9.0 9.1 9.0  --   MG  --   --  2.0  --   PHOS  --   --  3.3  --    Liver Function Tests: Recent Labs  Lab 10/08/24 2259 10/09/24 0502  AST 13* 12*  ALT 8 7  ALKPHOS 132* 122  BILITOT 0.6 0.6  PROT 7.2 6.7  ALBUMIN 3.9 3.7   CBG: Recent Labs  Lab 10/10/24 1715 10/10/24 2049 10/11/24 0437 10/11/24 0718 10/11/24 1143  GLUCAP 293* 407* 193* 223* 357*    Discharge time spent: 40 minutes.  Signed: [name], MD [facility] 10/11/2024 "

## 2024-10-11 NOTE — Progress Notes (Signed)
 Patient has discharge orders, discharge teaching given and no further questions, patient given education on oxygen  usage at home, oxygen  was delivered to patients room and education given by [employer] as well. Patient voices she understands, pt wheeled down to main entrance to vehicle accompanied by her sister.

## 2024-10-11 NOTE — Progress Notes (Addendum)
 SATURATION QUALIFICATIONS: (This note is used to comply with regulatory documentation for home oxygen )  Patient Saturations on Room Air at Rest = 96%  Patient Saturations on Room Air while Ambulating = 86%  Patient Saturations on 2 Liters of oxygen  while Ambulating = 95%  Please briefly explain why patient needs home oxygen : Patient desats as low as 87% and patient expresses SOB.

## 2024-10-11 NOTE — Progress Notes (Signed)
 Late entry: Around 2100, this RN was notified of patient CBG 407. Upon assessment patient c/o dizziness and general malaise (I feel like my blood sugar is high.). Patient states she had not received insulin  since her breakfast dose. MD notified. AC to bedside to talk with mt. Orders placed and carried out.

## 2024-10-11 NOTE — Inpatient Diabetes Management (Signed)
 Inpatient Diabetes Program Recommendations  AACE/ADA: New Consensus Statement on Inpatient Glycemic Control   Target Ranges:  Prepandial:   less than 140 mg/dL      Peak postprandial:   less than 180 mg/dL (1-2 hours)      Critically ill patients:  140 - 180 mg/dL    Latest Reference Range & Units 10/10/24 07:31 10/10/24 11:15 10/10/24 17:15 10/10/24 20:49 10/11/24 04:37 10/11/24 07:18  Glucose-Capillary 70 - 99 mg/dL 866 (H) 815 (H) 706 (H) 407 (H) 193 (H) 223 (H)   Review of Glycemic Control  Diabetes history: DM2 Outpatient Diabetes medications: Lantus  18 units QAM, Lantus  50 units QPM, Humalog  16 units TID, Jardiance  25 mg daily Current orders for Inpatient glycemic control: Lantus  4 units BID, Novolog  0-9 units TID with meals, Novolog  0-5 units QHS  Inpatient Diabetes Program Recommendations:    Insulin : Please consider increasing Lantus  to 5 units BID and ordering Novolog  4 units TID with meals for meal coverage if patient eats at least 50% of meals.  Thanks, Earnie Gainer, RN, MSN, CDCES Diabetes Coordinator Inpatient Diabetes Program [phone] (Team Pager from 8am to 5pm)

## 2024-10-11 NOTE — Telephone Encounter (Signed)
 Patient stated she was recently released from the hospital and wants a call back regarding getting a portable oxygen  tank.

## 2024-10-12 ENCOUNTER — Ambulatory Visit: Admitting: Nurse Practitioner

## 2024-10-12 LAB — CYTOLOGY - NON PAP

## 2024-10-12 NOTE — Telephone Encounter (Signed)
 Spoke with patient regarding oxygen  tank. Advised her that she will need to reach out to her primary care physician in regards to setting that up. Patient verbalized understanding

## 2024-10-15 LAB — CULTURE, BODY FLUID W GRAM STAIN -BOTTLE
Culture: NO GROWTH
Special Requests: ADEQUATE

## 2024-10-17 LAB — MISC LABCORP TEST (SEND OUT): Labcorp test code: 9985

## 2024-11-20 ENCOUNTER — Ambulatory Visit: Admitting: Nurse Practitioner

## 2024-12-24 ENCOUNTER — Ambulatory Visit
